# Patient Record
Sex: Male | Born: 1950 | Race: White | Hispanic: No | Marital: Married | Smoking: Never smoker
Health system: Southern US, Community
[De-identification: ages and names within clinical notes are randomized; demographics above are authoritative.]

## PROBLEM LIST (undated history)

## (undated) DIAGNOSIS — E119 Type 2 diabetes mellitus without complications: Secondary | ICD-10-CM

## (undated) DIAGNOSIS — K219 Gastro-esophageal reflux disease without esophagitis: Secondary | ICD-10-CM

## (undated) DIAGNOSIS — G459 Transient cerebral ischemic attack, unspecified: Secondary | ICD-10-CM

## (undated) DIAGNOSIS — E669 Obesity, unspecified: Secondary | ICD-10-CM

## (undated) DIAGNOSIS — F419 Anxiety disorder, unspecified: Secondary | ICD-10-CM

## (undated) DIAGNOSIS — I2510 Atherosclerotic heart disease of native coronary artery without angina pectoris: Secondary | ICD-10-CM

## (undated) DIAGNOSIS — I4891 Unspecified atrial fibrillation: Secondary | ICD-10-CM

## (undated) DIAGNOSIS — G4733 Obstructive sleep apnea (adult) (pediatric): Secondary | ICD-10-CM

## (undated) DIAGNOSIS — K760 Fatty (change of) liver, not elsewhere classified: Secondary | ICD-10-CM

## (undated) DIAGNOSIS — I214 Non-ST elevation (NSTEMI) myocardial infarction: Secondary | ICD-10-CM

## (undated) DIAGNOSIS — E785 Hyperlipidemia, unspecified: Secondary | ICD-10-CM

## (undated) DIAGNOSIS — R600 Localized edema: Secondary | ICD-10-CM

## (undated) DIAGNOSIS — D369 Benign neoplasm, unspecified site: Secondary | ICD-10-CM

## (undated) DIAGNOSIS — R0602 Shortness of breath: Secondary | ICD-10-CM

## (undated) DIAGNOSIS — I509 Heart failure, unspecified: Secondary | ICD-10-CM

## (undated) DIAGNOSIS — G4730 Sleep apnea, unspecified: Secondary | ICD-10-CM

## (undated) DIAGNOSIS — I10 Essential (primary) hypertension: Secondary | ICD-10-CM

## (undated) DIAGNOSIS — R42 Dizziness and giddiness: Secondary | ICD-10-CM

## (undated) DIAGNOSIS — Z9861 Coronary angioplasty status: Secondary | ICD-10-CM

## (undated) DIAGNOSIS — G2581 Restless legs syndrome: Secondary | ICD-10-CM

## (undated) HISTORY — PX: OTHER SURGICAL HISTORY: SHX169

## (undated) HISTORY — DX: Shortness of breath: R06.02

## (undated) HISTORY — PX: KNEE ARTHROSCOPY: SUR90

## (undated) HISTORY — DX: Restless legs syndrome: G25.81

## (undated) HISTORY — PX: VASECTOMY: SHX75

## (undated) HISTORY — DX: Essential (primary) hypertension: I10

## (undated) HISTORY — DX: Benign neoplasm, unspecified site: D36.9

## (undated) HISTORY — DX: Type 2 diabetes mellitus without complications: E11.9

## (undated) HISTORY — DX: Obstructive sleep apnea (adult) (pediatric): G47.33

## (undated) HISTORY — DX: Localized edema: R60.0

## (undated) HISTORY — DX: Transient cerebral ischemic attack, unspecified: G45.9

## (undated) HISTORY — DX: Dizziness and giddiness: R42

## (undated) HISTORY — DX: Sleep apnea, unspecified: G47.30

## (undated) HISTORY — DX: Atherosclerotic heart disease of native coronary artery without angina pectoris: I25.10

## (undated) HISTORY — DX: Heart failure, unspecified: I50.9

## (undated) HISTORY — DX: Coronary angioplasty status: Z98.61

## (undated) HISTORY — DX: Fatty (change of) liver, not elsewhere classified: K76.0

## (undated) HISTORY — DX: Obesity, unspecified: E66.9

## (undated) HISTORY — DX: Anxiety disorder, unspecified: F41.9

## (undated) HISTORY — DX: Non-ST elevation (NSTEMI) myocardial infarction: I21.4

## (undated) HISTORY — DX: Hyperlipidemia, unspecified: E78.5

## (undated) HISTORY — DX: Gastro-esophageal reflux disease without esophagitis: K21.9

## (undated) HISTORY — PX: COLONOSCOPY: SHX174

---

## 1998-12-26 ENCOUNTER — Ambulatory Visit (HOSPITAL_COMMUNITY): Admission: RE | Admit: 1998-12-26 | Discharge: 1998-12-26 | Payer: Self-pay | Admitting: Family Medicine

## 1999-09-12 ENCOUNTER — Emergency Department (HOSPITAL_COMMUNITY): Admission: EM | Admit: 1999-09-12 | Discharge: 1999-09-12 | Payer: Self-pay | Admitting: *Deleted

## 2000-08-22 ENCOUNTER — Encounter: Payer: Self-pay | Admitting: Family Medicine

## 2000-08-22 ENCOUNTER — Ambulatory Visit (HOSPITAL_COMMUNITY): Admission: RE | Admit: 2000-08-22 | Discharge: 2000-08-22 | Payer: Self-pay | Admitting: Family Medicine

## 2000-08-30 ENCOUNTER — Ambulatory Visit (HOSPITAL_COMMUNITY): Admission: RE | Admit: 2000-08-30 | Discharge: 2000-08-30 | Payer: Self-pay | Admitting: Unknown Physician Specialty

## 2000-08-30 ENCOUNTER — Encounter: Payer: Self-pay | Admitting: Gastroenterology

## 2000-08-31 ENCOUNTER — Ambulatory Visit (HOSPITAL_COMMUNITY): Admission: RE | Admit: 2000-08-31 | Discharge: 2000-08-31 | Payer: Self-pay | Admitting: Gastroenterology

## 2000-09-14 ENCOUNTER — Encounter: Payer: Self-pay | Admitting: General Surgery

## 2000-09-14 ENCOUNTER — Ambulatory Visit (HOSPITAL_COMMUNITY): Admission: RE | Admit: 2000-09-14 | Discharge: 2000-09-14 | Payer: Self-pay | Admitting: General Surgery

## 2001-10-18 HISTORY — PX: LEFT HEART CATH AND CORONARY ANGIOGRAPHY: CATH118249

## 2004-01-06 ENCOUNTER — Encounter: Admission: RE | Admit: 2004-01-06 | Discharge: 2004-01-06 | Payer: Self-pay | Admitting: Family Medicine

## 2004-10-18 HISTORY — PX: LEFT HEART CATH AND CORONARY ANGIOGRAPHY: CATH118249

## 2005-07-26 ENCOUNTER — Observation Stay (HOSPITAL_COMMUNITY): Admission: EM | Admit: 2005-07-26 | Discharge: 2005-07-27 | Payer: Self-pay | Admitting: *Deleted

## 2005-09-21 ENCOUNTER — Encounter: Admission: RE | Admit: 2005-09-21 | Discharge: 2005-09-21 | Payer: Self-pay | Admitting: Family Medicine

## 2005-10-01 ENCOUNTER — Encounter: Admission: RE | Admit: 2005-10-01 | Discharge: 2005-10-01 | Payer: Self-pay | Admitting: Orthopedic Surgery

## 2005-10-06 ENCOUNTER — Ambulatory Visit (HOSPITAL_BASED_OUTPATIENT_CLINIC_OR_DEPARTMENT_OTHER): Admission: RE | Admit: 2005-10-06 | Discharge: 2005-10-06 | Payer: Self-pay | Admitting: Orthopedic Surgery

## 2005-10-06 ENCOUNTER — Ambulatory Visit (HOSPITAL_COMMUNITY): Admission: RE | Admit: 2005-10-06 | Discharge: 2005-10-06 | Payer: Self-pay | Admitting: Orthopedic Surgery

## 2005-12-11 ENCOUNTER — Emergency Department (HOSPITAL_COMMUNITY): Admission: EM | Admit: 2005-12-11 | Discharge: 2005-12-11 | Payer: Self-pay | Admitting: Emergency Medicine

## 2006-02-05 ENCOUNTER — Emergency Department (HOSPITAL_COMMUNITY): Admission: EM | Admit: 2006-02-05 | Discharge: 2006-02-05 | Payer: Self-pay | Admitting: Emergency Medicine

## 2007-04-07 ENCOUNTER — Encounter: Admission: RE | Admit: 2007-04-07 | Discharge: 2007-04-07 | Payer: Self-pay | Admitting: Family Medicine

## 2007-04-20 ENCOUNTER — Emergency Department (HOSPITAL_COMMUNITY): Admission: EM | Admit: 2007-04-20 | Discharge: 2007-04-20 | Payer: Self-pay | Admitting: Emergency Medicine

## 2008-01-15 ENCOUNTER — Encounter: Admission: RE | Admit: 2008-01-15 | Discharge: 2008-01-15 | Payer: Self-pay | Admitting: Family Medicine

## 2008-05-01 ENCOUNTER — Encounter: Admission: RE | Admit: 2008-05-01 | Discharge: 2008-05-01 | Payer: Self-pay | Admitting: Family Medicine

## 2008-06-23 ENCOUNTER — Emergency Department (HOSPITAL_COMMUNITY): Admission: EM | Admit: 2008-06-23 | Discharge: 2008-06-23 | Payer: Self-pay | Admitting: Emergency Medicine

## 2009-07-05 ENCOUNTER — Emergency Department (HOSPITAL_COMMUNITY): Admission: EM | Admit: 2009-07-05 | Discharge: 2009-07-05 | Payer: Self-pay | Admitting: Emergency Medicine

## 2010-03-21 ENCOUNTER — Encounter: Admission: RE | Admit: 2010-03-21 | Discharge: 2010-03-21 | Payer: Self-pay | Admitting: Neurosurgery

## 2010-10-18 DIAGNOSIS — R42 Dizziness and giddiness: Secondary | ICD-10-CM

## 2010-10-18 HISTORY — DX: Dizziness and giddiness: R42

## 2010-11-08 ENCOUNTER — Encounter: Payer: Self-pay | Admitting: Family Medicine

## 2011-01-19 ENCOUNTER — Emergency Department (HOSPITAL_COMMUNITY)
Admission: EM | Admit: 2011-01-19 | Discharge: 2011-01-19 | Disposition: A | Discharge status: Home / Self Care | Payer: 59 | Attending: Emergency Medicine | Admitting: Emergency Medicine

## 2011-01-19 ENCOUNTER — Emergency Department (HOSPITAL_COMMUNITY): Payer: 59

## 2011-01-19 DIAGNOSIS — Z79899 Other long term (current) drug therapy: Secondary | ICD-10-CM | POA: Insufficient documentation

## 2011-01-19 DIAGNOSIS — K3189 Other diseases of stomach and duodenum: Secondary | ICD-10-CM | POA: Insufficient documentation

## 2011-01-19 DIAGNOSIS — Z7982 Long term (current) use of aspirin: Secondary | ICD-10-CM | POA: Insufficient documentation

## 2011-01-19 DIAGNOSIS — I519 Heart disease, unspecified: Secondary | ICD-10-CM | POA: Insufficient documentation

## 2011-01-19 DIAGNOSIS — R079 Chest pain, unspecified: Secondary | ICD-10-CM | POA: Insufficient documentation

## 2011-01-19 DIAGNOSIS — I10 Essential (primary) hypertension: Secondary | ICD-10-CM | POA: Insufficient documentation

## 2011-01-19 DIAGNOSIS — E7800 Pure hypercholesterolemia, unspecified: Secondary | ICD-10-CM | POA: Insufficient documentation

## 2011-01-19 DIAGNOSIS — R61 Generalized hyperhidrosis: Secondary | ICD-10-CM | POA: Insufficient documentation

## 2011-01-19 DIAGNOSIS — R1013 Epigastric pain: Secondary | ICD-10-CM | POA: Insufficient documentation

## 2011-01-19 LAB — DIFFERENTIAL
Basophils Absolute: 0 K/uL (ref 0.0–0.1)
Basophils Relative: 0 % (ref 0–1)
Eosinophils Absolute: 0.3 10*3/uL (ref 0.0–0.7)
Eosinophils Relative: 4 % (ref 0–5)
Lymphocytes Relative: 34 % (ref 12–46)
Lymphs Abs: 2.3 10*3/uL (ref 0.7–4.0)
Monocytes Absolute: 0.5 10*3/uL (ref 0.1–1.0)
Monocytes Relative: 8 % (ref 3–12)
Neutro Abs: 3.6 K/uL (ref 1.7–7.7)
Neutrophils Relative %: 54 % (ref 43–77)

## 2011-01-19 LAB — COMPREHENSIVE METABOLIC PANEL
AST: 23 U/L (ref 0–37)
CO2: 25 mEq/L (ref 19–32)
Calcium: 9.3 mg/dL (ref 8.4–10.5)
Chloride: 106 mEq/L (ref 96–112)
Glucose, Bld: 108 mg/dL — ABNORMAL HIGH (ref 70–99)
Sodium: 138 mEq/L (ref 135–145)
Total Protein: 6.7 g/dL (ref 6.0–8.3)

## 2011-01-19 LAB — COMPREHENSIVE METABOLIC PANEL WITH GFR
ALT: 24 U/L (ref 0–53)
Albumin: 3.9 g/dL (ref 3.5–5.2)
Alkaline Phosphatase: 59 U/L (ref 39–117)
BUN: 15 mg/dL (ref 6–23)
Creatinine, Ser: 1.09 mg/dL (ref 0.4–1.5)
GFR calc Af Amer: >60 mL/min (ref >60)
GFR calc non Af Amer: >60 mL/min (ref >60)
Potassium: 4.1 meq/L (ref 3.5–5.1)
Total Bilirubin: 0.6 mg/dL (ref 0.3–1.2)

## 2011-01-19 LAB — URINALYSIS, ROUTINE W REFLEX MICROSCOPIC
Bilirubin Urine: NEGATIVE
Glucose, UA: NEGATIVE mg/dL
Hgb urine dipstick: NEGATIVE
Ketones, ur: NEGATIVE mg/dL
Nitrite: NEGATIVE
Protein, ur: NEGATIVE mg/dL
Specific Gravity, Urine: 1.011 (ref 1.005–1.030)
Urobilinogen, UA: 0.2 mg/dL (ref 0.0–1.0)
pH: 6 (ref 5.0–8.0)

## 2011-01-19 LAB — POCT CARDIAC MARKERS
CKMB, poc: <1 ng/mL — ABNORMAL LOW (ref 1.0–8.0)
Myoglobin, poc: 82.7 ng/mL (ref 12–200)
Troponin i, poc: <0.05 ng/mL (ref 0.00–0.09)

## 2011-01-19 LAB — CBC
HCT: 42.3 % (ref 39.0–52.0)
Hemoglobin: 15.2 g/dL (ref 13.0–17.0)
MCH: 33.8 pg (ref 26.0–34.0)
MCHC: 35.9 g/dL (ref 30.0–36.0)
MCV: 94 fL (ref 78.0–100.0)
Platelets: 127 K/uL — ABNORMAL LOW (ref 150–400)
RBC: 4.5 MIL/uL (ref 4.22–5.81)
RDW: 12.7 % (ref 11.5–15.5)
WBC: 6.7 K/uL (ref 4.0–10.5)

## 2011-01-19 LAB — LIPASE, BLOOD: Lipase: 27 U/L (ref 11–59)

## 2011-03-05 NOTE — Op Note (Signed)
Devon Mathews, Devon Mathews                    ACCOUNT NO.:  [id number]   MEDICAL RECORD NO.:  [id number]          PATIENT TYPE:  AMB   LOCATION:  DSC                          FACILITY:  [facility]   PHYSICIAN:  Leonides Grills, M.D.     DATE OF BIRTH:  [birth year]   DATE OF PROCEDURE:  10/06/2005  DATE OF DISCHARGE:  10/06/2005                                 OPERATIVE REPORT   PREOPERATIVE DIAGNOSES:  1.  Left anterior ankle impingement.  2.  Left anterior distal tibial spurs.  3.  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  4.  Left multiple loose bodies ankle.   POSTOPERATIVE DIAGNOSES:  1.  Left anterior ankle impingement.  2.  Left anterior distal tibial spurs.  3.  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  4.  Left multiple loose bodies ankle.   OPERATION:  1.  Left ankle arthroscopy with extensive debridement.  2.  Left debridement drilling lateral talar dome osteochondral lesion.  3.  Chondroplasty left medial osteochondral lesion.  4.  Left excision multiple ankle loose bodies.  5.  Excision anterior distal tibial spurs.   ANESTHESIA:  General anesthesia.   SURGEON:  Leonides Grills, M.D.   ASSISTANT:  Devon Mathews, P.A.-C.   ESTIMATED BLOOD LOSS:  Minimal.   TOURNIQUET:  None.   COMPLICATIONS:  None.   DISPOSITION:  Stable to PR.   INDICATIONS FOR PROCEDURE:  This is a 60 year old male who has had  persistent anterior ankle pain that was interfering with his life to the  point that he could not do what wants to do.  Mechanical symptoms radiated  pain then giving way with clicking, catching and at times frank locking.  He  was consented for the above procedure.  All risks which included infection,  nerve, vessel injury, persistent pain, worse pain, stiffness, arthritis were  all explained.  Questions were encouraged and answered.  He also had MRI  findings of a peroneus brevus tendon tear but was asymptomatic, had no pain  on the posterolateral  aspect of his ankle.   DESCRIPTION OF PROCEDURE:  Patient brought to the operating room, placed in  supine position.  Initially after adequate general endotracheal tube  anesthesia  was administered as well as Ancef 1 g IV piggyback, patient was  then placed in sloppy lateral position with left operative side up.  All  bony prominences were well padded.  Left lower extremity was then prepped  and draped in the usual sterile manner.  No tourniquet was used.  We started  the procedure by mapping out the anatomical landmarks to include anterior  tibialis tendon and peroneus tertius.  Superficial peroneal nerve could not  be visualized.  Just medial to the anterior tibialis tendon, spinal needle  was then placed and 20 mL of normal saline was placed in the ankle.  Devon Mathews  and spread technique was then utilized to create the anterior medial portal  just medial to the anterior tibialis tendon.  Blunt tip trocar with cannula  followed by camera was then placed in  the ankle and under direct  visualization with the scope, the anterolateral portal was then created just  lateral to the peroneus tertius tendon using spinal needle followed by nick  and spread technique.  After this was done, we then performed an extensive  debridement.  There was a tremendous amount of synovitis over the entire  anterior aspect of the ankle.  This was done with a Barracuda shaver as well  as a radiofrequency bevel.  This took quite some time. Once this was done,  the incisions were then lengthened for the scope to accommodate removal of  the loose bodies.  Once this was done using a snap as well as a synovectomy  rongeur, seven loose bodies were removed from the ankle of varying sizes.  They were found in both the anterior, anterolateral and medial aspects of  the ankle.  Once this was done, we then visualized the cartilage.  There was  a 3 x 3 mm osteochondral lesion on the anterolateral talar dome.  A formal   debridement drilling was performed with a house curette followed by a  microfracture drilling technique.  Once this was done, we then had an area  of frayed cartilage on the medial talar dome. This was not soft and did not  have a full thickness lesion.  We then performed chondroplasty setting the  radiofrequency bevel on level 1 and approximately 2 mm above its surface  performed a chondroplasty and this cleaned this up nicely.  Once this was  done, the ankle was ranged and it was found that the anterior distal tibial  spur mostly medially and this was verified on CT scan, was a large spur,  with a 3.5 knee bur as well as a synovectomy rongeur, this spur was  completely removed.  The ankle was ranged and it was found that there was no  impinging areas.  There was obvious areas of cartilage wear throughout the  ankle, most likely secondary to multiple loose bodies that were floating  around and binding in his ankle.  Pictures were obtained throughout the  procedure.  Once inflow was decreased, the osteochondral lesion that was  debrided and drilled bled nicely.  Scope was removed.  Wounds were closed  with 4-0 nylon suture.  Sterile dressing was applied.  Cam Walker boot was  applied.  The patient was stable to the PR.      Leonides Grills, M.D.  Electronically Signed     PB/MEDQ  D:  10/06/2005  T:  10/08/2005  Job:  [id number]   cc:   Rodolph Bong, M.D.  Fax: [phone]

## 2011-03-05 NOTE — Discharge Summary (Signed)
Devon Mathews, Devon Mathews                    ACCOUNT NO.:  [id number]   MEDICAL RECORD NO.:  [id number]          PATIENT TYPE:  INP   LOCATION:  2007                         FACILITY:  [facility]   PHYSICIAN:  Richard A. Alanda Amass, M.D.DATE OF BIRTH:  1951/10/03   DATE OF ADMISSION:  07/26/2005  DATE OF DISCHARGE:  07/27/2005                                 DISCHARGE SUMMARY   Mr. Lytle Malburg is a 60 year old white married male patient of Dr. Susa Griffins who came to the hospital because of chest pain and shortness of  breath.  He had an onset of squeezing in his mid chest at 9 a.m. while  sitting at his desk doing paperwork.  No nausea, vomiting, diaphoresis.  No  radiation.  He had a similar episode approximately one week ago.  He has had  a history of coronary artery disease.  He had a catheterization in 2003.  He  states these are the same symptoms.  Thus, he came into the hospital, was  admitted.  His CPK-MBs were negative.  He went on to have a heart  catheterization on July 26, 2005 and it showed only a 40% in his LAD.  His  chest x-ray was negative.  It was decided to put him empirically on b.i.d.  Protonix.   LABORATORIES:  Hemoglobin was 13.5, hematocrit 39.3, WBC 4.9, platelets 162.  Sodium 140, potassium 3.7, BUN 8, creatinine 1.1, glucose 110.  AST was 23,  ALT was 36.  CK-MBs and troponins were negative.  TSH was 2.235.  Uric acid  was 4.1.  His fasting lipid profile was pending at the time of this  discharge.  His chest x-ray showed cardiomegaly.   DISCHARGE MEDICATIONS:  1.  Zocor 20 mg at bedtime.  2.  Allopurinol 300 mg one time per day.  3.  Aspirin 81 mg one time per day.  4.  Toprol XL 25 mg one time per day.  5.  Protonix 40 mg two times per day.  6.  Niaspan 1000 mg at bedtime.   He should do no lifting, pushing, pulling, strenuous activity x1 week.  He  should take a tub bath or sit in a pool of water for one week.  He did have  a Perclose procedure.  He will be on  a low saturated and low trans fatty  acid diet.  He may drive in one day.   DISCHARGE DIAGNOSES:  1.  Chest pain, not coronary ischemia status post catheterization with only      a 40% left anterior descending.  2.  Normal ejection fraction.  3.  Obesity.  4.  Hyperlipidemia.  5.  Gastroesophageal reflux disease and hiatal hernia.  6.  Hypertension.      Lezlie Octave, N.P.      Richard A. Alanda Amass, M.D.  Electronically Signed    BB/MEDQ  D:  07/27/2005  T:  07/27/2005  Job:  [id number]   cc:   Quita Skye. Artis Flock, M.D.  Fax: [phone]

## 2011-03-05 NOTE — Cardiovascular Report (Signed)
NAMEJAXEN, SAMPLES                    ACCOUNT NO.:  [id number]   MEDICAL RECORD NO.:  [id number]          PATIENT TYPE:  INP   LOCATION:  2007                         FACILITY:  [facility]   PHYSICIAN:  Richard A. Alanda Amass, M.D.DATE OF BIRTH:  October 30, 1950   DATE OF PROCEDURE:  07/26/2005  DATE OF DISCHARGE:                              CARDIAC CATHETERIZATION   PROCEDURE:  1.  Retrograde central aortic catheterization.  2.  Selective coronary angiography via Judkins technique.  3.  LV angiogram, RAO/LAO projection.  4.  Abdominal aortic angiogram, midstream PA projection, hand injection.   PROCEDURE:  The patient was brought to the Second Floor CPU Lab in a  postabsorptive state after 5 mg Valium p.o. premedication.  No preoperative  heparin was given.  Renal function, CBC and diff. was normal __________  proceed with diagnostic coronary angiography.  The right groin was prepped,  draped in the usual manner.  Xylocaine 1% was used for local anesthesia and  the CRFA was entered with the single anterior puncture using an 18 thin-  walled needle using modified Seldinger technique.  A #6 [place]  __Daig________ side-arm sheath was inserted without difficulty.  Coronary  angiography was done with 6 French 4 cm taper, preformed cordis coronary and  pigtail catheters.  LV angiogram was done in the RAO and LAO projection at  25 mL, 14 mL per second and 20 mL, 12 mL per second respectively.  Pullback  pressure to the CA showed no gradient across the aortic valve.  Abdominal  angiogram was done in the midstream PA projection by hand injection above  the level of the renal arteries.  This demonstrated single normal renal  arteries bilaterally and normal appearing infrarenal abdominal aorta.  The  catheter was removed, side-arm sheath was flushed.  A right femoral  angiogram was done by hand injection in the oblique projection,  demonstrating good puncture into the RCFA.  The groin was then closed  with  an Angio-Seal 6 French closure device successfully in the laboratory.  The  patient was transferred to the holding area for postoperative care in stable  condition.  He tolerated the procedure well.   PRESSURES:  LV:  140/0; LVEDP 16 mmHg.  CA:  140/80 mmHg.   There was no gradient across the aortic valve on catheter pullback.   LV angiogram demonstrated a normally contracting ventricle with no segmental  wall motion abnormality, EF greater than 55% and no mitral regurgitation.  Suggestion of mild ventricular hypertrophy in the LAO projection.   Fluoroscopy did not reveal any significant coronary, intracardiac or  valvular calcification.   The main left coronary artery was normal.   The left anterior descending artery had a 40% segmental mildly irregular  lesion after the large first diagonal and before SP1.  This was in the  region of prior LAD narrowing and did not appear any different, based on  patient's prior cath report.  The remainder of the LAD was widely patent,  coursed the apex of the heart and bifurcated at the undersurface with good  flow.  There was some mild systolic bridging in the mid LA but no systolic  compression seen.   The first diagonal branch was large, arose before the 40% LAD lesion,  trifurcated and was normal.   The second diagonal arose at the junction of the proximal third of the LAD  after SP1 and was small and normal.  There was a small third diagonal that  was normal from the mid LAD.   There was a small optional diagonal vessel that was normal.   Circumflex was comprised of a large bifurcating first marginal branch that  arose proximally, it was normal.  The remainder of the circumflex gave off a  small OM-2 and a small PAVG and distal circumflex branch which were normal.   The right coronary was the dominant vessel.  It was widely patent and smooth  throughout with normal PDA and PLA, normal RV branches from the mid portion  and conus  branch proximally.   DISCUSSION:  Mr. Quintin is a 60 year old married father of two with 3  grandchildren who is a nonsmoker.  He has hyperlipidemia and a strong family  history of coronary disease whose father, Claudio Mondry, Sr., is a patient of  mine and has had CABG.  He works as a Agricultural engineer.  He has been evaluated  for chest pain symptoms in the past and underwent catheterization by Dr.  Allyson Sabal as an outpatient August 28, 2002, and had 30-40% segmental proximal  LAD narrowing and was treated medically.  A Cardiolite on January 31, 2004,  showed mild diaphragmatic attenuation with no significant ischemia.  While  at work today he had two episodes of moderately severe substernal chest pain  while sitting at his desk doing paperwork, no nausea, vomiting, diaphoresis  or radiation, and he was brought to the [facility] Emergency [facility] by ambulance.  Enzymes, including CPK and troponin, were negative.  EKG showed nonspecific  ST segment changes with minor ICVD, no change from previous EKGs with sinus  rhythm.  He was referred for diagnostic catheterization by Dr. Allyson Sabal.  Catheterization shows approximately 40% mildly hypodense submental proximal  LAD stenosis, that does not appear obstructive, with good LAD flow.  The  remainder of his coronary arteries are normal.  I would recommend continued  medical therapy in this setting, weight reduction exercise program,  continued statins, beta-blockers and consideration for ACE inhibitors or  ARB, depending upon his blood pressure, for optimal medical therapy.  The  etiology of his chest pain is not clear and we will empirically give him  upper GI medication at present.   CATHETERIZATION DIAGNOSES:  1.  Chest pain etiology not determined.  2.  Exogenous obesity.  3.  Hyperlipidemia, on therapy.  4.  Gastroesophageal reflux disease.  5.  Minor coronary artery disease, 40% segmental proximal left anterior     descending stenosis, no significantly changed  from prior catheterization      2003.  6.  Mild hypertension.      Richard A. Alanda Amass, M.D.  Electronically Signed     RAW/MEDQ  D:  07/26/2005  T:  07/26/2005  Job:  [id number]   cc:   Quita Skye. Artis Flock, M.D.  Fax: [phone]

## 2011-07-21 LAB — DIFFERENTIAL
Basophils Relative: 0
Eosinophils Absolute: 0.2
Eosinophils Relative: 4
Lymphs Abs: 1.9
Monocytes Absolute: 0.4
Monocytes Relative: 8
Neutrophils Relative %: 52

## 2011-07-21 LAB — B-NATRIURETIC PEPTIDE (CONVERTED LAB): Pro B Natriuretic peptide (BNP): 37

## 2011-07-21 LAB — URINE CULTURE: Culture: NO GROWTH

## 2011-07-21 LAB — URINALYSIS, ROUTINE W REFLEX MICROSCOPIC
Bilirubin Urine: NEGATIVE
Glucose, UA: NEGATIVE
Hgb urine dipstick: NEGATIVE
Ketones, ur: NEGATIVE
Protein, ur: NEGATIVE
Urobilinogen, UA: 0.2

## 2011-07-21 LAB — POCT CARDIAC MARKERS: CKMB, poc: <1 — ABNORMAL LOW

## 2011-07-21 LAB — COMPREHENSIVE METABOLIC PANEL
AST: 19
Albumin: 3.4 — ABNORMAL LOW
Alkaline Phosphatase: 62
BUN: 12
Chloride: 109
GFR calc Af Amer: >60
Potassium: 4.1
Total Protein: 5.9 — ABNORMAL LOW

## 2011-07-21 LAB — BASIC METABOLIC PANEL
CO2: 25
Chloride: 108
Creatinine, Ser: 1.08
GFR calc Af Amer: >60
Potassium: 4.1

## 2011-07-21 LAB — CBC
HCT: 40.7
Hemoglobin: 13.9
MCHC: 34.2
MCV: 99.4
RBC: 4.09 — ABNORMAL LOW
WBC: 5.2

## 2011-08-03 LAB — COMPREHENSIVE METABOLIC PANEL
Alkaline Phosphatase: 67
BUN: 10
CO2: 25
Chloride: 106
GFR calc non Af Amer: >60
Glucose, Bld: 98
Potassium: 5
Total Bilirubin: 1.5 — ABNORMAL HIGH

## 2011-08-03 LAB — DIFFERENTIAL
Basophils Absolute: 0
Basophils Relative: 1
Monocytes Absolute: 0.4
Neutro Abs: 3.6
Neutrophils Relative %: 56

## 2011-08-03 LAB — CBC
HCT: 44.2
Hemoglobin: 15.6
MCV: 95.5
WBC: 6.5

## 2011-08-03 LAB — URINALYSIS, ROUTINE W REFLEX MICROSCOPIC
Glucose, UA: NEGATIVE
Hgb urine dipstick: NEGATIVE
Ketones, ur: NEGATIVE
pH: 6.5

## 2011-08-03 LAB — LIPASE, BLOOD: Lipase: 22

## 2013-03-07 ENCOUNTER — Telehealth: Payer: Self-pay | Admitting: Cardiovascular Disease

## 2013-03-07 NOTE — Telephone Encounter (Signed)
Devon Mathews needs his prescription refill, but needs an authorization for the refills: Simcor 1000/20 mg .. Pharmacy [facility] [phone]

## 2013-03-07 NOTE — Telephone Encounter (Signed)
Returned call.  Pt informed of refill process.  Pt verbalized understanding and agreed w/ plan.  E-rx sent to [facility] and pt aware.

## 2013-03-13 ENCOUNTER — Telehealth: Payer: Self-pay | Admitting: Cardiovascular Disease

## 2013-03-13 NOTE — Telephone Encounter (Signed)
Pharmacist need you to call-wants to be sure this is pt's medicine and not his father medicine-pt wife is there waiting!

## 2013-03-13 NOTE — Telephone Encounter (Signed)
He wants to talk to Devon Mathews C-Pt states that he is very upset about mix up in his prescription-said he was there at the pharmacy for over a hour!

## 2013-06-27 ENCOUNTER — Other Ambulatory Visit: Payer: Self-pay | Admitting: Cardiovascular Disease

## 2013-06-27 ENCOUNTER — Other Ambulatory Visit: Payer: Self-pay | Admitting: *Deleted

## 2013-06-27 DIAGNOSIS — I10 Essential (primary) hypertension: Secondary | ICD-10-CM

## 2013-06-27 LAB — COMPREHENSIVE METABOLIC PANEL
ALT: 33 U/L (ref 0–53)
Alkaline Phosphatase: 62 U/L (ref 39–117)
CO2: 25 mEq/L (ref 19–32)
Sodium: 138 mEq/L (ref 135–145)
Total Bilirubin: 0.6 mg/dL (ref 0.3–1.2)
Total Protein: 6.6 g/dL (ref 6.0–8.3)

## 2013-06-27 LAB — CBC WITH DIFFERENTIAL/PLATELET
Eosinophils Absolute: 0.3 10*3/uL (ref 0.0–0.7)
Lymphs Abs: 1.6 10*3/uL (ref 0.7–4.0)
MCH: 33.9 pg (ref 26.0–34.0)
Neutrophils Relative %: 56 % (ref 43–77)
Platelets: 147 10*3/uL — ABNORMAL LOW (ref 150–400)
RBC: 4.4 MIL/uL (ref 4.22–5.81)
WBC: 5.2 10*3/uL (ref 4.0–10.5)

## 2013-06-27 LAB — HEMOGLOBIN A1C: Hgb A1c MFr Bld: 5.7 % — ABNORMAL HIGH (ref <5.7)

## 2013-06-27 LAB — URIC ACID: Uric Acid, Serum: 6.2 mg/dL (ref 4.0–7.8)

## 2013-06-27 LAB — LIPID PANEL
Cholesterol: 140 mg/dL (ref 0–200)
LDL Cholesterol: 78 mg/dL (ref 0–99)
VLDL: 21 mg/dL (ref 0–40)

## 2013-06-27 LAB — TSH: TSH: 3.744 u[IU]/mL (ref 0.350–4.500)

## 2013-07-02 ENCOUNTER — Ambulatory Visit (HOSPITAL_COMMUNITY)
Admission: RE | Admit: 2013-07-02 | Discharge: 2013-07-02 | Disposition: A | Discharge status: Home / Self Care | Payer: 59 | Source: Ambulatory Visit | Attending: Cardiovascular Disease | Admitting: Cardiovascular Disease

## 2013-07-02 DIAGNOSIS — I10 Essential (primary) hypertension: Secondary | ICD-10-CM | POA: Insufficient documentation

## 2013-07-02 NOTE — Progress Notes (Signed)
2D Echo Performed 07/02/2013    Jasn Xia, RCS  

## 2013-07-10 DIAGNOSIS — G4730 Sleep apnea, unspecified: Secondary | ICD-10-CM

## 2013-07-27 ENCOUNTER — Encounter: Payer: Self-pay | Admitting: Cardiovascular Disease

## 2013-08-01 ENCOUNTER — Telehealth: Payer: Self-pay | Admitting: Cardiovascular Disease

## 2013-08-01 NOTE — Telephone Encounter (Signed)
Wants to get results of sleep study done about a month ago.  No one has called him  Please call

## 2013-08-01 NOTE — Telephone Encounter (Signed)
Former pt of Dr. Alanda Amass.  Reviewed chart and result in Epic.  Pt has been assigned to Dr. Herbie Baltimore.    Message forwarded to Dr. Donneta Romberg for review.

## 2013-08-03 NOTE — Telephone Encounter (Signed)
Contacted [facility]. Spoke to [employer]. She stated that they have attempted to call patient to set up his 2nd night study with know success. She only has had his home #; Gave her the # he called into the office.she stated she would give him a call today.  Called and spoke to the patient. Informed him he needed a 2nd night test. Also informed him that [facility] has been trying get in touch with him on his home phone. He stated he was not at home and they would have to contact him on his cell phone. Informed him that they had it

## 2013-08-24 ENCOUNTER — Telehealth: Payer: Self-pay | Admitting: Cardiovascular Disease

## 2013-08-24 NOTE — Telephone Encounter (Signed)
Need refill on Simcor 1000/20 mg #30.Please call today

## 2013-08-24 NOTE — Telephone Encounter (Signed)
Paper chart requested.

## 2013-08-27 MED ORDER — NIACIN-SIMVASTATIN ER 1000-20 MG PO TB24: 1.0000 | ORAL_TABLET | Freq: Every day | ORAL | Status: DC

## 2013-08-27 NOTE — Telephone Encounter (Signed)
Call to pt and verified x 2.  Pt informed refill request received and need to review meds. Unable to view last OV note at time r/t wrong chart.  Pt confirmed he is taking Simcor and reviewed current meds.  List updated.  Pt would like Rx sent to [facility] [address] instead of [facility] and informed it will be once paper chart received and confirmed.  Pt verbalized understanding and agreed w/ plan.  Paper chart reviewed and Refill(s) sent to pharmacy.  Pt requested 30-day supply.  Name Alert written on pt/father's paper chart as they have the same name and are both patients.  Pt wanted to know about sleep study results.  Stated he went back for second night and hasn't heard anything from anyone.  Informed Burna Mortimer, CMA, who works w/ Dr. Tresa Endo and sleep patients, will be notified.  Pt verbalized understanding and agreed w/ plan.

## 2013-08-28 ENCOUNTER — Other Ambulatory Visit: Payer: Self-pay | Admitting: *Deleted

## 2013-08-28 MED ORDER — PANTOPRAZOLE SODIUM 40 MG PO TBEC: 40.0000 mg | DELAYED_RELEASE_TABLET | Freq: Every day | ORAL | Status: DC

## 2013-08-31 ENCOUNTER — Telehealth: Payer: Self-pay | Admitting: Cardiovascular Disease

## 2013-08-31 MED ORDER — NIACIN-SIMVASTATIN ER 1000-20 MG PO TB24: 1.0000 | ORAL_TABLET | Freq: Every day | ORAL | Status: DC

## 2013-08-31 NOTE — Telephone Encounter (Signed)
Returned call and pt verified x 2 w/ wife, Harriett Sine.  Informed Rx was sent and documentation receipt confirmed by pharmacy.  Informed RN also called pharmacy to make sure it was received and was told it was not.  RN gave VO for Rx.  Wife verbalized understanding and will check back w/ pharmacy.

## 2013-08-31 NOTE — Telephone Encounter (Signed)
Please call-stated that he have been waiting for 2 weeks for his medicine.York Spaniel he spoke to you on Monday and you said you would call it in.He still have not gotten his medicine.

## 2013-09-05 ENCOUNTER — Telehealth: Payer: Self-pay | Admitting: *Deleted

## 2013-09-05 NOTE — Telephone Encounter (Signed)
Devon Mathews called in requesting information about Devon Mathews's sleep study results.  I called [facility] and they processed that yesterday and sent the order to [facility].  Devon Mathews requested the order be sent to [place].  I called [facility] and they will send the order to [facility].

## 2013-09-07 ENCOUNTER — Other Ambulatory Visit: Payer: Self-pay | Admitting: *Deleted

## 2014-01-21 ENCOUNTER — Other Ambulatory Visit: Payer: Self-pay

## 2014-01-21 MED ORDER — METOPROLOL SUCCINATE ER 25 MG PO TB24: 25.0000 mg | ORAL_TABLET | Freq: Every day | ORAL | Status: DC

## 2014-01-21 NOTE — Telephone Encounter (Signed)
Rx was sent to pharmacy electronically. 

## 2014-06-06 ENCOUNTER — Other Ambulatory Visit: Payer: Self-pay | Admitting: Cardiovascular Disease

## 2014-06-06 NOTE — Telephone Encounter (Signed)
Rx was sent to pharmacy electronically. 

## 2014-07-28 ENCOUNTER — Other Ambulatory Visit: Payer: Self-pay | Admitting: Cardiology

## 2014-07-29 NOTE — Telephone Encounter (Signed)
Rx was sent to pharmacy electronically. 

## 2014-09-02 ENCOUNTER — Other Ambulatory Visit: Payer: Self-pay | Admitting: Cardiology

## 2014-09-03 ENCOUNTER — Other Ambulatory Visit: Payer: Self-pay | Admitting: *Deleted

## 2014-09-03 ENCOUNTER — Other Ambulatory Visit: Payer: Self-pay

## 2014-09-03 MED ORDER — PANTOPRAZOLE SODIUM 40 MG PO TBEC: 40.0000 mg | DELAYED_RELEASE_TABLET | Freq: Every day | ORAL | Status: DC

## 2014-09-03 MED ORDER — NIACIN-SIMVASTATIN ER 1000-20 MG PO TB24: 1.0000 | ORAL_TABLET | Freq: Every day | ORAL | Status: DC

## 2014-09-03 NOTE — Telephone Encounter (Signed)
Rx sent to pharmacy   

## 2014-10-05 ENCOUNTER — Other Ambulatory Visit: Payer: Self-pay | Admitting: Cardiology

## 2014-10-07 NOTE — Telephone Encounter (Signed)
Rx(s) sent to pharmacy electronically.  

## 2014-11-04 ENCOUNTER — Telehealth: Payer: Self-pay | Admitting: Cardiology

## 2014-11-06 NOTE — Telephone Encounter (Signed)
Close encounter  He is no longer a pt at this practice

## 2014-11-07 ENCOUNTER — Ambulatory Visit (HOSPITAL_COMMUNITY)
Admission: RE | Admit: 2014-11-07 | Discharge: 2014-11-07 | Disposition: A | Discharge status: Home / Self Care | Payer: Self-pay | Source: Ambulatory Visit | Attending: Sports Medicine | Admitting: Sports Medicine

## 2014-11-07 ENCOUNTER — Other Ambulatory Visit (HOSPITAL_COMMUNITY): Payer: Self-pay | Admitting: Sports Medicine

## 2014-11-07 DIAGNOSIS — M25561 Pain in right knee: Secondary | ICD-10-CM

## 2014-11-07 DIAGNOSIS — Z043 Encounter for examination and observation following other accident: Secondary | ICD-10-CM | POA: Insufficient documentation

## 2015-03-20 DIAGNOSIS — C4492 Squamous cell carcinoma of skin, unspecified: Secondary | ICD-10-CM

## 2015-03-20 HISTORY — DX: Squamous cell carcinoma of skin, unspecified: C44.92

## 2015-04-29 ENCOUNTER — Encounter: Payer: Self-pay | Admitting: *Deleted

## 2015-06-04 ENCOUNTER — Encounter: Payer: Self-pay | Admitting: Cardiovascular Disease

## 2015-06-19 ENCOUNTER — Encounter: Payer: Self-pay | Admitting: Cardiovascular Disease

## 2015-10-10 ENCOUNTER — Ambulatory Visit (HOSPITAL_COMMUNITY)
Admission: RE | Admit: 2015-10-10 | Discharge: 2015-10-10 | Disposition: A | Discharge status: Home / Self Care | Payer: Commercial Managed Care - HMO | Source: Ambulatory Visit | Attending: Internal Medicine | Admitting: Internal Medicine

## 2015-10-10 ENCOUNTER — Encounter (HOSPITAL_COMMUNITY): Payer: Self-pay

## 2015-10-10 ENCOUNTER — Other Ambulatory Visit (HOSPITAL_COMMUNITY): Payer: Self-pay | Admitting: Internal Medicine

## 2015-10-10 DIAGNOSIS — R1031 Right lower quadrant pain: Secondary | ICD-10-CM

## 2015-10-10 DIAGNOSIS — K429 Umbilical hernia without obstruction or gangrene: Secondary | ICD-10-CM | POA: Diagnosis not present

## 2015-10-10 DIAGNOSIS — R1011 Right upper quadrant pain: Secondary | ICD-10-CM

## 2015-10-10 MED ORDER — IOHEXOL 300 MG/ML  SOLN
150.0000 mL | Freq: Once | INTRAMUSCULAR | Status: AC | PRN
  Administered 2015-10-10: 100 mL via INTRAVENOUS

## 2015-10-10 MED ORDER — IOHEXOL 300 MG/ML  SOLN
25.0000 mL | INTRAMUSCULAR | Status: AC
  Administered 2015-10-10: 25 mL via ORAL

## 2016-06-28 ENCOUNTER — Telehealth: Payer: Self-pay | Admitting: Cardiovascular Disease

## 2016-06-28 NOTE — Telephone Encounter (Signed)
Received records from [facility] for appointment on 07/28/16 with Dr Oval [name].  Records given to [facility] (medical records) for Dr Blenda Mounts schedule on 07/28/16. lp

## 2016-07-18 HISTORY — PX: NM MYOVIEW LTD: HXRAD82

## 2016-07-18 HISTORY — PX: TRANSTHORACIC ECHOCARDIOGRAM: SHX275

## 2016-07-27 NOTE — Progress Notes (Signed)
Cardiology Office Note   Date:  07/28/2016   ID:  Devon Mathews, DOB 1950-11-29, MRN [id number]  PCP:  Haywood Pao, MD  Cardiologist:   Skeet Latch, MD   Chief Complaint  Patient presents with  . Chest Pain    pt states every now and then he gets a jabbing feeling under left breast  . Shortness of Breath    upon exertion   . Edema    left ankle   . New Patient (Initial Visit)    pt states he gets cramps in his calf at night       History of Present Illness: Devon Mathews is a 65 y.o. male with CAD, hypertension, hyperlipidemia, OSA and obestiy who presents for an evaluation of chest pain and shortness of breath.  Devon Mathews was previously seen by Dr. Rollene Fare and by Dr. Einar Gip.  He last saw Dr. Einar Gip 01/2015 and reported mild, chronic dyspnea. He previously underwent cardiac catheterization in 2003 and 2006 with a 40% LAD lesion noted.  He had an ETT in 2007 that was negative for ischemia and a nuclear stress in 2011 and 2012 that were negative.  He had an echo 07/02/13 that revealed LVEF 55-60% and mild mitral regurgitation.  He had a sleep study in 2014 that demonstrated moderate OSA.  Devon Mathews reports that for the last several months he notes substernal chest tightness that is 5/10 in severity and does not radiate.  It is associated with shortness of breath and improves within 20 seconds of rest.  There is no associated nausea, diaphoresis, palpitations, or lightheadedness. He especially notes that when walking up an incline. Devon Mathews does not get any formal exercise but is on his feet a lot at work.  He notes swelling in bilateral lower extremities that is worse on the left. He does report previously injuring his left ankle several years ago. The swelling has been chronic since that time. He denies orthopnea or PND.  Devon Mathews notes that his diet is poor.  He typically eats a [place] sandwich every morning and eats many hamburgers.  He has tried to cut back on soda and sweet  tea.  He has a significant family history of heart disease.  His father had a heart attack in his mid 2s and his sister had a heart attack at age 31. They both subsequently developed heart failure. His mother also developed heart failure at an older age.   Past Medical History:  Diagnosis Date  . CAD (coronary artery disease)    Echo, EF =>55%, normal findings  . Chest pain 07/28/2016  . Chest pain, atypical 2012 & 2009   stress myo, EF 65% 2012/ stress myo, EF 65% 2009  . Dizziness 2012   normal findings..  . Essential hypertension 07/28/2016  . Lower extremity edema 07/28/2016  . Obesity   . Obesity (BMI 30-39.9) 07/28/2016  . OSA (obstructive sleep apnea) 07/28/2016  . Restless leg syndrome    mild  . Shortness of breath 07/28/2016    Past Surgical History:  Procedure Laterality Date  . CARDIAC CATHETERIZATION  2003   30-40% proximal segmental stenosis in the LAD  . CARDIAC CATHETERIZATION  2006   EF >50% & no mitral regurgitation     Current Outpatient Prescriptions  Medication Sig Dispense Refill  . amitriptyline (ELAVIL) 25 MG tablet Take 25 mg by mouth at bedtime as needed for sleep.    Marland Kitchen aspirin EC 81 MG tablet  Take 81 mg by mouth daily.    . metoprolol succinate (TOPROL-XL) 25 MG 24 hr tablet Take 1 tablet (25 mg total) by mouth daily. 30 tablet 5  . nitroGLYCERIN (NITROSTAT) 0.4 MG SL tablet Place 0.4 mg under the tongue every 5 (five) minutes as needed for chest pain.    . pantoprazole (PROTONIX) 40 MG tablet TAKE ONE TABLET BY MOUTH ONE TIME DAILY. must make follow up appointment for further refills 15 tablet 0  . SIMCOR 1000-20 MG 24 hr tablet TAKE ONE TABLET BY MOUTH NIGHTLY AT BEDTIME. needs appointment for further refills 15 tablet 0   No current facility-administered medications for this visit.     Allergies:   Penicillins    Social History:  The patient  reports that he has never smoked. He has never used smokeless tobacco.   Family History:  The  patient's family history includes COPD in his father; Diabetes in his brother and sister; Heart attack in his paternal grandmother and sister; Heart disease in his father; Heart failure in his father, mother, and sister; Other in his father and maternal grandfather.    ROS:  Please see the history of present illness.   Otherwise, review of systems are positive for hand tremor, memory loss.   All other systems are reviewed and negative.    PHYSICAL EXAM: VS:  BP 124/76   Pulse 72   Ht 5\' 10"  (1.778 m)   Wt 271 lb 9.6 oz (123.2 kg)   BMI 38.97 kg/m  , Body mass index is 38.97 kg/m. GENERAL:  Well appearing HEENT:  Pupils equal round and reactive, fundi not visualized, oral mucosa unremarkable NECK:  No jugular venous distention, waveform within normal limits, carotid upstroke brisk and symmetric, no bruits, no thyromegaly LYMPHATICS:  No cervical adenopathy LUNGS:  Clear to auscultation bilaterally HEART:  RRR.  PMI not displaced or sustained,S1 and S2 within normal limits, no S3, no S4, no clicks, no rubs, no murmurs ABD:  Flat, positive bowel sounds normal in frequency in pitch, no bruits, no rebound, no guarding, no midline pulsatile mass, no hepatomegaly, no splenomegaly EXT:  2 plus pulses throughout, 1+ pitting edema in the R LE and 2+ in the L, no cyanosis no clubbing SKIN:  No rashes no nodules NEURO:  Cranial nerves II through XII grossly intact, motor grossly intact throughout PSYCH:  Cognitively intact, oriented to person place and time    EKG:  EKG is ordered today. The ekg ordered today demonstrates sinus rhythm. Rate 72 bpm.  Echo 07/02/13: Study Conclusions  - Left ventricle: The cavity size was normal. Wall thickness was normal. Systolic function was normal. The estimated ejection fraction was in the range of 55% to 60%. Wall motion was normal; there were no regional wall motion abnormalities. Left ventricular diastolic function parameters were normal. -  Mitral valve: Mild regurgitation. Valve area by pressure half-time: 2.06cm^2. - Atrial septum: No defect or patent foramen ovale was identified. Impressions:  - Normal 2D echo-doppler study for age.  Persantine Myoview 12/15/10: LVEF 65%.  Negative for ischemia.   Recent Labs: No results found for requested labs within last 8760 hours.    Lipid Panel    Component Value Date/Time   CHOL 140 06/27/2013 0840   TRIG 103 06/27/2013 0840   HDL 41 06/27/2013 0840   CHOLHDL 3.4 06/27/2013 0840   VLDL 21 06/27/2013 0840   LDLCALC 78 06/27/2013 0840      Wt Readings from Last 3 Encounters:  07/28/16 271 lb 9.6 oz (123.2 kg)     ASSESSMENT AND PLAN:   # Non-obstructive CAD:  # Chest pain:  Devon Mathews has a known 40% LAD lesion and now has exertional chest pain.  We will obtain an exercise Myoview to evaluate for ischemia. He will start aspirin 81 mg daily.   # Hypertension: Blood pressure is well-controlled on metoprolol.  # Obesity: We discussed the importance of dietary changes and increased exercise.  He expressed understanding.  We will check a hemoglobin A1c.  # OSA: Devon Mathews had moderate OSA on sleep study several years ago and has not been wearing his mask lately, despite snoring and gaining 20 lb.  He will consider restarting.  # LE edema: # SOB:  We will get an echo to rule out heart failure.  I suspect it is due to venous insufficiency.  Although the edema is asymmetric, he has a prior ankle injury and the swelling is chronic, so DVT unlikely.    Current medicines are reviewed at length with the patient today.  The patient does not have concerns regarding medicines.  The following changes have been made:  Start aspirin 81 mg daily   Labs/ tests ordered today include:   Orders Placed This Encounter  Procedures  . Lipid panel  . CBC with Differential/Platelet  . T4, free  . TSH  . Comprehensive metabolic panel  . HgB A1c  . Myocardial Perfusion Imaging  .  EKG 12-Lead  . ECHOCARDIOGRAM COMPLETE     Disposition:   FU with Devon Mathison C. Oval [name], MD, [facility] in 1 month.     This note was written with the assistance of speech recognition software.  Please excuse any transcriptional errors.  Signed, Nalleli Largent C. Oval [name], MD, [facility]  07/28/2016 8:52 AM    [facility]

## 2016-07-28 ENCOUNTER — Encounter (INDEPENDENT_AMBULATORY_CARE_PROVIDER_SITE_OTHER): Payer: Self-pay

## 2016-07-28 ENCOUNTER — Encounter: Payer: Self-pay | Admitting: Cardiovascular Disease

## 2016-07-28 ENCOUNTER — Ambulatory Visit (INDEPENDENT_AMBULATORY_CARE_PROVIDER_SITE_OTHER): Payer: Commercial Managed Care - HMO | Admitting: Cardiovascular Disease

## 2016-07-28 VITALS — BP 124/76 | HR 72 | Ht 70.0 in | Wt 271.6 lb

## 2016-07-28 DIAGNOSIS — G4733 Obstructive sleep apnea (adult) (pediatric): Secondary | ICD-10-CM

## 2016-07-28 DIAGNOSIS — R079 Chest pain, unspecified: Secondary | ICD-10-CM | POA: Diagnosis not present

## 2016-07-28 DIAGNOSIS — I209 Angina pectoris, unspecified: Secondary | ICD-10-CM | POA: Insufficient documentation

## 2016-07-28 DIAGNOSIS — R0602 Shortness of breath: Secondary | ICD-10-CM

## 2016-07-28 DIAGNOSIS — I208 Other forms of angina pectoris: Secondary | ICD-10-CM

## 2016-07-28 DIAGNOSIS — E669 Obesity, unspecified: Secondary | ICD-10-CM | POA: Diagnosis not present

## 2016-07-28 DIAGNOSIS — R600 Localized edema: Secondary | ICD-10-CM | POA: Insufficient documentation

## 2016-07-28 DIAGNOSIS — R5383 Other fatigue: Secondary | ICD-10-CM

## 2016-07-28 DIAGNOSIS — Z13220 Encounter for screening for lipoid disorders: Secondary | ICD-10-CM

## 2016-07-28 DIAGNOSIS — R251 Tremor, unspecified: Secondary | ICD-10-CM

## 2016-07-28 DIAGNOSIS — I10 Essential (primary) hypertension: Secondary | ICD-10-CM

## 2016-07-28 HISTORY — DX: Localized edema: R60.0

## 2016-07-28 HISTORY — DX: Essential (primary) hypertension: I10

## 2016-07-28 HISTORY — DX: Obesity, unspecified: E66.9

## 2016-07-28 HISTORY — DX: Shortness of breath: R06.02

## 2016-07-28 HISTORY — DX: Obstructive sleep apnea (adult) (pediatric): G47.33

## 2016-07-28 NOTE — Patient Instructions (Addendum)
Medication Instructions:  START ASPIRIN 81 MG DAILY  Labwork: FASTING LP/CMET/CBC/TSH/FT4/A1C AT [facility] ON THE FIRST FLOOR WHEN YOU RETURN FOR YOUR STRESS TEST  Testing/Procedures: Your physician has requested that you have an echocardiogram. Echocardiography is a painless test that uses sound waves to create images of your heart. It provides your doctor with information about the size and shape of your heart and how well your heart's chambers and valves are working. This procedure takes approximately one hour. There are no restrictions for this procedure. [place] [address]  Your physician has requested that you have en exercise stress myoview. For further information please visit [email or web address]. Please follow instruction sheet, as given. 2 DAY STUDY   Follow-Up: Your physician recommends that you schedule a follow-up appointment in: [place]  If you need a refill on your cardiac medications before your next appointment, please call your pharmacy.

## 2016-08-10 ENCOUNTER — Telehealth (HOSPITAL_COMMUNITY): Payer: Self-pay

## 2016-08-10 ENCOUNTER — Telehealth: Payer: Self-pay | Admitting: Cardiovascular Disease

## 2016-08-10 NOTE — Telephone Encounter (Signed)
Patient was concerned about having perfusion imaging study. Patient several question.  RN answered question - one to know why it was 2 day test.  2- wanted to know if test was safe-"cancerogenic" 3-was it enclosed machine- because he can not tolreae enclosure Patient verbalized  that question were answered and understanding.

## 2016-08-10 NOTE — Telephone Encounter (Signed)
new message   Pt verbalized that he is calling to speak to the rn he has ome questions that he wants to ask her

## 2016-08-10 NOTE — Telephone Encounter (Signed)
Pt to call if any questions or concerns.  Encounter complete.

## 2016-08-12 ENCOUNTER — Ambulatory Visit (HOSPITAL_COMMUNITY)
Admission: RE | Admit: 2016-08-12 | Discharge: 2016-08-12 | Disposition: A | Discharge status: Home / Self Care | Payer: Commercial Managed Care - HMO | Source: Ambulatory Visit | Attending: Cardiovascular Disease | Admitting: Cardiovascular Disease

## 2016-08-12 DIAGNOSIS — R0602 Shortness of breath: Secondary | ICD-10-CM

## 2016-08-12 DIAGNOSIS — R079 Chest pain, unspecified: Secondary | ICD-10-CM | POA: Insufficient documentation

## 2016-08-12 MED ORDER — TECHNETIUM TC 99M TETROFOSMIN IV KIT
32.0000 | PACK | Freq: Once | INTRAVENOUS | Status: AC | PRN
  Administered 2016-08-12: 32 via INTRAVENOUS
  Filled 2016-08-12: qty 32

## 2016-08-13 ENCOUNTER — Ambulatory Visit (HOSPITAL_COMMUNITY)
Admission: RE | Admit: 2016-08-13 | Discharge: 2016-08-13 | Disposition: A | Discharge status: Home / Self Care | Payer: Commercial Managed Care - HMO | Source: Ambulatory Visit | Attending: Cardiovascular Disease | Admitting: Cardiovascular Disease

## 2016-08-13 LAB — MYOCARDIAL PERFUSION IMAGING
CHL CUP NUCLEAR SDS: 4
CHL CUP NUCLEAR SRS: 0
CHL CUP NUCLEAR SSS: 4
CHL RATE OF PERCEIVED EXERTION: 17
CSEPED: 5 min
CSEPEW: 6.4 METS
CSEPHR: 87 %
Exercise duration (sec): 1 s
LV dias vol: 86 mL (ref 62–150)
LVSYSVOL: 32 mL
MPHR: 156 {beats}/min
NUC STRESS TID: 0.82
Peak HR: 137 {beats}/min
Rest HR: 73 {beats}/min

## 2016-08-13 MED ORDER — TECHNETIUM TC 99M TETROFOSMIN IV KIT
29.9000 | PACK | Freq: Once | INTRAVENOUS | Status: AC | PRN
  Administered 2016-08-13: 29.9 via INTRAVENOUS

## 2016-08-16 ENCOUNTER — Ambulatory Visit (HOSPITAL_COMMUNITY): Payer: Commercial Managed Care - HMO | Attending: Cardiology

## 2016-08-16 ENCOUNTER — Other Ambulatory Visit: Payer: Self-pay

## 2016-08-16 DIAGNOSIS — R079 Chest pain, unspecified: Secondary | ICD-10-CM | POA: Diagnosis not present

## 2016-08-16 DIAGNOSIS — R0602 Shortness of breath: Secondary | ICD-10-CM | POA: Insufficient documentation

## 2016-08-16 DIAGNOSIS — I083 Combined rheumatic disorders of mitral, aortic and tricuspid valves: Secondary | ICD-10-CM | POA: Diagnosis not present

## 2016-08-24 ENCOUNTER — Encounter: Payer: Self-pay | Admitting: Cardiovascular Disease

## 2016-08-29 NOTE — Progress Notes (Signed)
Cardiology Office Note   Date:  08/30/2016   ID:  Devon Mathews, DOB [birth year], MRN [id number]  PCP:  Haywood Pao, MD  Cardiologist:   Skeet Latch, MD   Chief Complaint  Patient presents with  . Follow-up    1 month; Pt states no Sx.     History of Present Illness: Devon Mathews is a 65 y.o. male with nonobstructive CAD, mild AR, hypertension, hyperlipidemia, OSA and obestiy who presents for follow up.  Devon Mathews was previously seen by Dr. Rollene Fare and by Dr. Einar Gip.  He last saw Dr. Einar Gip 01/2015 and reported mild, chronic dyspnea. He previously underwent cardiac catheterization in 2003 and 2006 with a 40% LAD lesion noted.  He had an ETT in 2007 that was negative for ischemia and a nuclear stress in 2011 and 2012 that were negative.  He had an echo 07/02/13 that revealed LVEF 55-60% and mild mitral regurgitation. He was seen in clinic 07/2016 and reported several months of chest pain and exertional shortness of breath.  He was referred for exercise Myoview 08/13/16 that revealed LVEF 63% and no ischemia.  He also had an echo 08/16/16 with LVEF 60-65%, mild LVH, mild AR and mild TR.  Devon Mathews had a sleep study in 2014 that demonstrated moderate OSA.  At his last appointment he noted that he was not using his CPAP machine.  He continues to avoid the CPAP.  He isn't exercising.  He continues to note mild LE edema at the end of the day.  It improves with elevation of his legs.  He denies chest pain but does have some shortness of breath.     Past Medical History:  Diagnosis Date  . CAD (coronary artery disease)    Echo, EF =>55%, normal findings  . Chest pain 07/28/2016  . Chest pain, atypical 2012 & 2009   stress myo, EF 65% 2012/ stress myo, EF 65% 2009  . Dizziness 2012   normal findings..  . Essential hypertension 07/28/2016  . Lower extremity edema 07/28/2016  . Obesity   . Obesity (BMI 30-39.9) 07/28/2016  . OSA (obstructive sleep apnea) 07/28/2016  . Restless leg  syndrome    mild  . Shortness of breath 07/28/2016    Past Surgical History:  Procedure Laterality Date  . CARDIAC CATHETERIZATION  2003   30-40% proximal segmental stenosis in the LAD  . CARDIAC CATHETERIZATION  2006   EF >50% & no mitral regurgitation     Current Outpatient Prescriptions  Medication Sig Dispense Refill  . amitriptyline (ELAVIL) 25 MG tablet Take 25 mg by mouth at bedtime as needed for sleep.    Marland Kitchen aspirin EC 81 MG tablet Take 81 mg by mouth daily.    . metoprolol succinate (TOPROL-XL) 25 MG 24 hr tablet Take 1 tablet (25 mg total) by mouth daily. 30 tablet 5  . nitroGLYCERIN (NITROSTAT) 0.4 MG SL tablet Place 0.4 mg under the tongue every 5 (five) minutes as needed for chest pain.    . pantoprazole (PROTONIX) 40 MG tablet TAKE ONE TABLET BY MOUTH ONE TIME DAILY. must make follow up appointment for further refills 15 tablet 0  . simvastatin (ZOCOR) 20 MG tablet Take 20 mg by mouth daily.     No current facility-administered medications for this visit.     Allergies:   Penicillins    Social History:  The patient  reports that he has never smoked. He has never used smokeless tobacco. He  reports that he does not drink alcohol or use drugs.   Family History:  The patient's family history includes COPD in his father; Diabetes in his brother and sister; Heart attack in his paternal grandmother and sister; Heart disease in his father; Heart failure in his father, mother, and sister; Other in his father and maternal grandfather.    ROS:  Please see the history of present illness.   Otherwise, review of systems are positive for none.  All other systems are reviewed and negative.    PHYSICAL EXAM: VS:  BP 126/77   Pulse 68   Ht 5' 9.5" (1.765 m)   Wt 124 kg (273 lb 6.4 oz)   BMI 39.80 kg/m  , Body mass index is 39.8 kg/m. GENERAL:  Well appearing HEENT:  Pupils equal round and reactive, fundi not visualized, oral mucosa unremarkable NECK:  No jugular venous  distention, waveform within normal limits, carotid upstroke brisk and symmetric, no bruits LYMPHATICS:  No cervical adenopathy LUNGS:  Clear to auscultation bilaterally HEART:  RRR.  PMI not displaced or sustained,S1 and S2 within normal limits, no S3, no S4, no clicks, no rubs, no murmurs ABD:  Flat, positive bowel sounds normal in frequency in pitch, no bruits, no rebound, no guarding, no midline pulsatile mass, no hepatomegaly, no splenomegaly EXT:  2 plus pulses throughout, 1+ pitting edema in the R LE and 2+ in the L, no cyanosis no clubbing SKIN:  No rashes no nodules NEURO:  Cranial nerves II through XII grossly intact, motor grossly intact throughout PSYCH:  Cognitively intact, oriented to person place and time   EKG:  EKG is ordered today. The ekg ordered today demonstrates sinus rhythm. Rate 72 bpm.  Echo 08/16/16: LVEF 60-65%, mild LVH.  Mild AR, mild TR, trivial MR.  Echo 07/02/13: Study Conclusions  - Left ventricle: The cavity size was normal. Wall thickness was normal. Systolic function was normal. The estimated ejection fraction was in the range of 55% to 60%. Wall motion was normal; there were no regional wall motion abnormalities. Left ventricular diastolic function parameters were normal. - Mitral valve: Mild regurgitation. Valve area by pressure half-time: 2.06cm^2. - Atrial septum: No defect or patent foramen ovale was identified. Impressions:  - Normal 2D echo-doppler study for age.  Exercise Myoview 08/13/16:   The left ventricular ejection fraction is normal (55-65%).  Nuclear stress EF: 63%.  There was no ST segment deviation noted during stress.  The study is normal.  This is a low risk study.     Persantine Myoview 12/15/10: LVEF 65%.  Negative for ischemia.   Recent Labs: No results found for requested labs within last 8760 hours.    Lipid Panel    Component Value Date/Time   CHOL 140 06/27/2013 0840   TRIG 103  06/27/2013 0840   HDL 41 06/27/2013 0840   CHOLHDL 3.4 06/27/2013 0840   VLDL 21 06/27/2013 0840   LDLCALC 78 06/27/2013 0840      Wt Readings from Last 3 Encounters:  08/30/16 124 kg (273 lb 6.4 oz)  08/12/16 122.9 kg (271 lb)  07/28/16 123.2 kg (271 lb 9.6 oz)     ASSESSMENT AND PLAN:   # Non-obstructive CAD:  # Chest pain:  Mr. Seever has a known 40% LAD lesion.  He denies recurrent chest pain.  Exercise Myoview was negative for ischemia. Continue aspirin 81 mg daily. He reports that his PCP manages his lipids. Continue simvastatin and metoprolol.   # Hypertension: Blood  pressure is well-controlled on metoprolol.  # Obesity: We discussed the importance of dietary changes and increased exercise.  He expressed understanding.    # OSA: Mr. Haba had moderate OSA on sleep study.  He was again encouraged to wear his CPAP machine.  # LE edema: # SOB:  Echo is negative for heart failure. He has mild aortic regurgitation. I suspect that his lower extremity edema is due to obesity and venous insufficiency. We discussed wearing compression stockings in avoiding salt.   Current medicines are reviewed at length with the patient today.  The patient does not have concerns regarding medicines.  The following changes have been made:  None.  Labs/ tests ordered today include:   No orders of the defined types were placed in this encounter.    Disposition:   FU with Valincia Touch C. Oval [name], MD, [facility] in 1 year.   This note was written with the assistance of speech recognition software.  Please excuse any transcriptional errors.  Signed, Cruz Devilla C. Oval [name], MD, [facility]  08/30/2016 8:24 AM    [facility]

## 2016-08-30 ENCOUNTER — Encounter: Payer: Self-pay | Admitting: Cardiovascular Disease

## 2016-08-30 ENCOUNTER — Ambulatory Visit (INDEPENDENT_AMBULATORY_CARE_PROVIDER_SITE_OTHER): Payer: Commercial Managed Care - HMO | Admitting: Cardiovascular Disease

## 2016-08-30 VITALS — BP 126/77 | HR 68 | Ht 69.5 in | Wt 273.4 lb

## 2016-08-30 DIAGNOSIS — E7800 Pure hypercholesterolemia, unspecified: Secondary | ICD-10-CM

## 2016-08-30 DIAGNOSIS — I2510 Atherosclerotic heart disease of native coronary artery without angina pectoris: Secondary | ICD-10-CM

## 2016-08-30 DIAGNOSIS — I119 Hypertensive heart disease without heart failure: Secondary | ICD-10-CM | POA: Diagnosis not present

## 2016-08-30 DIAGNOSIS — R600 Localized edema: Secondary | ICD-10-CM

## 2016-08-30 DIAGNOSIS — R0602 Shortness of breath: Secondary | ICD-10-CM

## 2016-08-30 DIAGNOSIS — G4733 Obstructive sleep apnea (adult) (pediatric): Secondary | ICD-10-CM

## 2016-08-30 DIAGNOSIS — E6609 Other obesity due to excess calories: Secondary | ICD-10-CM | POA: Diagnosis not present

## 2016-08-30 NOTE — Patient Instructions (Signed)

## 2017-04-25 ENCOUNTER — Other Ambulatory Visit: Payer: Self-pay | Admitting: Internal Medicine

## 2017-04-25 DIAGNOSIS — R109 Unspecified abdominal pain: Secondary | ICD-10-CM

## 2017-04-29 ENCOUNTER — Ambulatory Visit
Admission: RE | Admit: 2017-04-29 | Discharge: 2017-04-29 | Disposition: A | Discharge status: Home / Self Care | Payer: BLUE CROSS/BLUE SHIELD | Source: Ambulatory Visit | Attending: Internal Medicine | Admitting: Internal Medicine

## 2017-04-29 ENCOUNTER — Other Ambulatory Visit (HOSPITAL_COMMUNITY): Payer: Self-pay | Admitting: Internal Medicine

## 2017-04-29 DIAGNOSIS — R109 Unspecified abdominal pain: Secondary | ICD-10-CM

## 2017-04-29 DIAGNOSIS — R1011 Right upper quadrant pain: Secondary | ICD-10-CM

## 2017-05-09 ENCOUNTER — Ambulatory Visit (HOSPITAL_COMMUNITY)
Admission: RE | Admit: 2017-05-09 | Discharge: 2017-05-09 | Disposition: A | Discharge status: Home / Self Care | Payer: BLUE CROSS/BLUE SHIELD | Source: Ambulatory Visit | Attending: Internal Medicine | Admitting: Internal Medicine

## 2017-05-09 DIAGNOSIS — R1031 Right lower quadrant pain: Secondary | ICD-10-CM | POA: Insufficient documentation

## 2017-05-09 DIAGNOSIS — R1011 Right upper quadrant pain: Secondary | ICD-10-CM | POA: Diagnosis present

## 2017-05-09 MED ORDER — TECHNETIUM TC 99M MEBROFENIN IV KIT
5.5000 | PACK | Freq: Once | INTRAVENOUS | Status: AC | PRN
  Administered 2017-05-09: 5.5 via INTRAVENOUS

## 2017-06-09 DIAGNOSIS — G4733 Obstructive sleep apnea (adult) (pediatric): Secondary | ICD-10-CM | POA: Diagnosis not present

## 2017-08-15 ENCOUNTER — Encounter: Payer: Self-pay | Admitting: Cardiovascular Disease

## 2017-08-15 ENCOUNTER — Ambulatory Visit (INDEPENDENT_AMBULATORY_CARE_PROVIDER_SITE_OTHER): Payer: BLUE CROSS/BLUE SHIELD | Admitting: Cardiovascular Disease

## 2017-08-15 VITALS — BP 132/68 | HR 70 | Ht 70.0 in | Wt 279.2 lb

## 2017-08-15 DIAGNOSIS — I119 Hypertensive heart disease without heart failure: Secondary | ICD-10-CM

## 2017-08-15 DIAGNOSIS — G4733 Obstructive sleep apnea (adult) (pediatric): Secondary | ICD-10-CM

## 2017-08-15 DIAGNOSIS — R0602 Shortness of breath: Secondary | ICD-10-CM

## 2017-08-15 DIAGNOSIS — E7800 Pure hypercholesterolemia, unspecified: Secondary | ICD-10-CM | POA: Diagnosis not present

## 2017-08-15 DIAGNOSIS — I2510 Atherosclerotic heart disease of native coronary artery without angina pectoris: Secondary | ICD-10-CM | POA: Diagnosis not present

## 2017-08-15 NOTE — Patient Instructions (Signed)
Medication Instructions:  START ASPIRIN 81 MG DAILY   Labwork: NONE  Testing/Procedures: NONE  Follow-Up: Your physician wants you to follow-up in: 1 [place] will receive a reminder letter in the mail two months in advance. If you don't receive a letter, please call our office to schedule the follow-up appointment.  Any Other Special Instructions Will Be Listed Below (If Applicable). START WALKING 30 MINUTES A DAY 3 TO 4 DAYS A WEEK   If you need a refill on your cardiac medications before your next appointment, please call your pharmacy.

## 2017-08-15 NOTE — Progress Notes (Signed)
Cardiology Office Note   Date:  08/15/2017   ID:  Devon Mathews, DOB [birth year], MRN [id number]  PCP:  Haywood Pao, MD  Cardiologist:   Skeet Latch, MD   Chief Complaint  Patient presents with  . Follow-up    History of Present Illness: Devon Mathews is a 66 y.o. male with nonobstructive CAD, mild AR, hypertension, hyperlipidemia, OSA and obestiy who presents for follow up.  Devon Mathews was previously seen by Dr. Rollene Fare and by Dr. Einar Gip.  He last saw Dr. Einar Gip 01/2015 and reported mild, chronic dyspnea. He previously underwent cardiac catheterization in 2003 and 2006 with a 40% LAD lesion noted.  He had an ETT in 2007 that was negative for ischemia and a nuclear stress in 2011 and 2012 that were negative.  He had an echo 07/02/13 that revealed LVEF 55-60% and mild mitral regurgitation. He was seen in clinic 07/2016 and reported several months of chest pain and exertional shortness of breath.  He was referred for exercise Myoview 08/13/16 that revealed LVEF 63% and no ischemia.  He also had an echo 08/16/16 with LVEF 60-65%, mild LVH, mild AR and mild TR.  Devon Mathews had a sleep study in 2014 that demonstrated moderate OSA.  At his last appointment he noted that he was not using his CPAP machine.  He switched to nasal pillows and now uses it regularly.  He hasn't been exercising, which he attributes to laziness.  He denies chest pain but does have some exertional dyspnea and chest tightness.  This is unchanged from last year when he had his stress test.  He denies lower extremity edema, orthopnea or PND.  He has intermittent episodes of dizziness that typically occur when he first wakes up in the AM or when he is getting out of bed to use the bathroom at night.  He denies syncope and it never happens during the day.  The feeling lasts for 5-10 minutes at a time. Devon Mathews denies lower extremity edema, orthopnea, or PND.   Past Medical History:  Diagnosis Date  . CAD (coronary artery  disease)    Echo, EF =>55%, normal findings  . Chest pain 07/28/2016  . Chest pain, atypical 2012 & 2009   stress myo, EF 65% 2012/ stress myo, EF 65% 2009  . Dizziness 2012   normal findings..  . Essential hypertension 07/28/2016  . Lower extremity edema 07/28/2016  . Obesity   . Obesity (BMI 30-39.9) 07/28/2016  . OSA (obstructive sleep apnea) 07/28/2016  . Restless leg syndrome    mild  . Shortness of breath 07/28/2016    Past Surgical History:  Procedure Laterality Date  . CARDIAC CATHETERIZATION  2003   30-40% proximal segmental stenosis in the LAD  . CARDIAC CATHETERIZATION  2006   EF >50% & no mitral regurgitation     Current Outpatient Prescriptions  Medication Sig Dispense Refill  . amitriptyline (ELAVIL) 25 MG tablet Take 25 mg by mouth at bedtime as needed for sleep.    Marland Kitchen aspirin EC 81 MG tablet Take 81 mg by mouth daily.    . metoprolol succinate (TOPROL-XL) 25 MG 24 hr tablet Take 1 tablet (25 mg total) by mouth daily. 30 tablet 5  . pantoprazole (PROTONIX) 40 MG tablet TAKE ONE TABLET BY MOUTH ONE TIME DAILY. must make follow up appointment for further refills 15 tablet 0  . simvastatin (ZOCOR) 20 MG tablet Take 20 mg by mouth daily.  No current facility-administered medications for this visit.     Allergies:   Penicillins    Social History:  The patient  reports that he has never smoked. He has never used smokeless tobacco. He reports that he does not drink alcohol or use drugs.   Family History:  The patient's family history includes COPD in his father; Diabetes in his brother and sister; Heart attack in his paternal grandmother and sister; Heart disease in his father; Heart failure in his father, mother, and sister; Other in his father and maternal grandfather.    ROS:  Please see the history of present illness.   Otherwise, review of systems are positive for none.  All other systems are reviewed and negative.    PHYSICAL EXAM: VS:  BP 132/68    Pulse 70   Ht 5\' 10"  (1.778 m)   Wt 126.6 kg (279 lb 3.2 oz)   BMI 40.06 kg/m  , BMI Body mass index is 40.06 kg/m. GENERAL:  Well appearing.  No acute distress HEENT: Pupils equal round and reactive, fundi not visualized, oral mucosa unremarkable NECK:  No jugular venous distention, waveform within normal limits, carotid upstroke brisk and symmetric, no bruits, no thyromegaly LUNGS:  Clear to auscultation bilaterally HEART:  RRR.  PMI not displaced or sustained,S1 and S2 within normal limits, no S3, no S4, no clicks, no rubs, no murmurs ABD:  Central adiposity.  Umbilcal hernia. positive bowel sounds normal in frequency in pitch, no bruits, no rebound, no guarding, no midline pulsatile mass, no hepatomegaly, no splenomegaly EXT:  2 plus pulses throughout, no edema, no cyanosis no clubbing SKIN:  No rashes no nodules NEURO:  Cranial nerves II through XII grossly intact, motor grossly intact throughout PSYCH:  Cognitively intact, oriented to person place and time   EKG:  EKG is ordered today. The ekg ordered today demonstrates sinus rhythm. Rate 72 bpm. 08/15/17:  Sinus rhythm rate 70 bpm.    Echo 08/16/16: LVEF 60-65%, mild LVH.  Mild AR, mild TR, trivial MR.  Echo 07/02/13: Study Conclusions  - Left ventricle: The cavity size was normal. Wall thickness was normal. Systolic function was normal. The estimated ejection fraction was in the range of 55% to 60%. Wall motion was normal; there were no regional wall motion abnormalities. Left ventricular diastolic function parameters were normal. - Mitral valve: Mild regurgitation. Valve area by pressure half-time: 2.06cm^2. - Atrial septum: No defect or patent foramen ovale was identified. Impressions:  - Normal 2D echo-doppler study for age.  Exercise Myoview 08/13/16:   The left ventricular ejection fraction is normal (55-65%).  Nuclear stress EF: 63%.  There was no ST segment deviation noted during  stress.  The study is normal.  This is a low risk study.    Persantine Myoview 12/15/10: LVEF 65%.  Negative for ischemia.   Recent Labs: No results found for requested labs within last 8760 hours.    Lipid Panel    Component Value Date/Time   CHOL 140 06/27/2013 0840   TRIG 103 06/27/2013 0840   HDL 41 06/27/2013 0840   CHOLHDL 3.4 06/27/2013 0840   VLDL 21 06/27/2013 0840   LDLCALC 78 06/27/2013 0840      Wt Readings from Last 3 Encounters:  08/15/17 126.6 kg (279 lb 3.2 oz)  08/30/16 124 kg (273 lb 6.4 oz)  08/12/16 122.9 kg (271 lb)     ASSESSMENT AND PLAN:   # Non-obstructive CAD:  # Chest pain:  Devon Mathews has  a known 40% LAD lesion.  Exercise Myoview was negative for ischemia 07/2016.  His symptoms are unchanged since that time.  He will restart aspirin 81mg  daily.  Continue simvastatin and metoprolol.  We will get a copy of his lipids from his PCP.  # Dizziness: Symptoms only happen after getting up from bed.  Recommended that he take his time.  No syncope.   # Hypertension: Blood pressure was initially elevated but better on repeat.  Continue metoprolol.  # Obesity: We discussed the importance of dietary changes and increased exercise.  He expressed understanding.    # OSA: Devon Mathews had moderate OSA on sleep study.  He was again encouraged to wear his CPAP machine.  # LE edema: # SOB:  Echo is negative for heart failure. Symptoms have improved.    # Morbid obesity: Recommended that he increase his exercise to at least 3-4 times per week.   Current medicines are reviewed at length with the patient today.  The patient does not have concerns regarding medicines.  The following changes have been made:  None.  Labs/ tests ordered today include:   No orders of the defined types were placed in this encounter.    Disposition:   FU with Yeng Perz C. Oval [name], MD, [facility] in 1 year.    Signed, Markee Remlinger C. Oval [name], MD, [facility]  08/15/2017 5:57 PM    [place]

## 2017-08-30 ENCOUNTER — Ambulatory Visit: Payer: BLUE CROSS/BLUE SHIELD | Admitting: Cardiovascular Disease

## 2017-10-06 DIAGNOSIS — M1711 Unilateral primary osteoarthritis, right knee: Secondary | ICD-10-CM | POA: Diagnosis not present

## 2017-10-17 DIAGNOSIS — R7301 Impaired fasting glucose: Secondary | ICD-10-CM | POA: Diagnosis not present

## 2017-10-17 DIAGNOSIS — Z0000 Encounter for general adult medical examination without abnormal findings: Secondary | ICD-10-CM | POA: Diagnosis not present

## 2017-10-17 DIAGNOSIS — I10 Essential (primary) hypertension: Secondary | ICD-10-CM | POA: Diagnosis not present

## 2017-10-17 DIAGNOSIS — R82998 Other abnormal findings in urine: Secondary | ICD-10-CM | POA: Diagnosis not present

## 2017-10-17 DIAGNOSIS — Z125 Encounter for screening for malignant neoplasm of prostate: Secondary | ICD-10-CM | POA: Diagnosis not present

## 2017-10-17 DIAGNOSIS — M109 Gout, unspecified: Secondary | ICD-10-CM | POA: Diagnosis not present

## 2017-10-21 DIAGNOSIS — Z1389 Encounter for screening for other disorder: Secondary | ICD-10-CM | POA: Diagnosis not present

## 2017-10-21 DIAGNOSIS — E668 Other obesity: Secondary | ICD-10-CM | POA: Diagnosis not present

## 2017-10-21 DIAGNOSIS — M109 Gout, unspecified: Secondary | ICD-10-CM | POA: Diagnosis not present

## 2017-10-21 DIAGNOSIS — I2510 Atherosclerotic heart disease of native coronary artery without angina pectoris: Secondary | ICD-10-CM | POA: Diagnosis not present

## 2017-10-21 DIAGNOSIS — Z23 Encounter for immunization: Secondary | ICD-10-CM | POA: Diagnosis not present

## 2017-10-21 DIAGNOSIS — M1990 Unspecified osteoarthritis, unspecified site: Secondary | ICD-10-CM | POA: Diagnosis not present

## 2017-10-21 DIAGNOSIS — Z0000 Encounter for general adult medical examination without abnormal findings: Secondary | ICD-10-CM | POA: Diagnosis not present

## 2017-10-21 DIAGNOSIS — Z6841 Body Mass Index (BMI) 40.0 and over, adult: Secondary | ICD-10-CM | POA: Diagnosis not present

## 2017-10-27 DIAGNOSIS — Z1212 Encounter for screening for malignant neoplasm of rectum: Secondary | ICD-10-CM | POA: Diagnosis not present

## 2017-10-31 DIAGNOSIS — H5203 Hypermetropia, bilateral: Secondary | ICD-10-CM | POA: Diagnosis not present

## 2017-10-31 DIAGNOSIS — H2513 Age-related nuclear cataract, bilateral: Secondary | ICD-10-CM | POA: Diagnosis not present

## 2017-10-31 DIAGNOSIS — H3500 Unspecified background retinopathy: Secondary | ICD-10-CM | POA: Diagnosis not present

## 2017-11-01 ENCOUNTER — Telehealth: Payer: Self-pay | Admitting: *Deleted

## 2017-11-01 DIAGNOSIS — S83241A Other tear of medial meniscus, current injury, right knee, initial encounter: Secondary | ICD-10-CM | POA: Diagnosis not present

## 2017-11-01 DIAGNOSIS — M1711 Unilateral primary osteoarthritis, right knee: Secondary | ICD-10-CM | POA: Diagnosis not present

## 2017-11-01 NOTE — Telephone Encounter (Signed)
    [facility] Pre-operative Risk Assessment    Request for surgical clearance:  1. What type of surgery is being performed? RIGHT KNEE ARTHROSCOPIC PARTIAL & MEDIAL MENISCECTOMY   2. When is this surgery scheduled? PENDING   3. Are there any medications that need to be held prior to surgery and how long?NONE   4. Practice name and name of physician performing surgery? [facility]   5. What is your office phone and fax number? PH=[phone] FAX=[phone]   6. Anesthesia type (None, local, MAC, general) ? UNKNOWN   Devon Mathews 11/01/2017, 5:16 PM  _________________________________________________________________   (provider comments below)

## 2017-11-02 ENCOUNTER — Telehealth: Payer: Self-pay | Admitting: *Deleted

## 2017-11-02 DIAGNOSIS — E785 Hyperlipidemia, unspecified: Secondary | ICD-10-CM

## 2017-11-02 DIAGNOSIS — I10 Essential (primary) hypertension: Secondary | ICD-10-CM

## 2017-11-02 MED ORDER — SIMVASTATIN 40 MG PO TABS: 40.0000 mg | ORAL_TABLET | Freq: Every day | ORAL | 5 refills | Status: DC

## 2017-11-02 NOTE — Telephone Encounter (Signed)
Advised patient of lab results, rx sent to [facility] and mailed lab order forms

## 2017-11-02 NOTE — Telephone Encounter (Signed)
   Primary Cardiologist: No primary care provider on file.  Chart reviewed as part of pre-operative protocol coverage. Patient was contacted 11/02/2017 in reference to pre-operative risk assessment for pending surgery as outlined below.  Devon Mathews was last seen on 08/15/17 by Dr. Oval [name].  Since that day, Devon Mathews has done well. He is walking as his knee pain allows and is currently building a gazebo at his house - he is doing so without anginal symptoms. His DASI is greater than 5 METS.  Therefore, based on ACC/AHA guidelines, the patient would be at acceptable risk for the planned procedure without further cardiovascular testing.   I will route this recommendation to Dr. Oval [name] to provide recommendation regarding holding ASA prior to surgery.  Tami Lin Saraya Tirey, PA 11/02/2017, 2:22 PM

## 2017-11-02 NOTE — Telephone Encounter (Signed)
-----   Message from Skeet Latch, MD sent at 10/31/2017 10:05 AM EST ----- Labs reviewed.  LDL or "bad" cholesterol is pretty good at 86 but should be <70.  Increase simvastatin to 40mg  daily and repeat lipids and CMP in 6-8 weeks.

## 2017-11-03 NOTE — Telephone Encounter (Signed)
Devon Mathews may hold aspirin 5 days prior to surgery.

## 2017-11-04 NOTE — Telephone Encounter (Signed)
   Pt cleared for surgery 11/02/17 (see below). Dr. Oval [name] is ok with pt holding ASA 5 days prior to surgery as outlined below. I will fax clearance notification to Dr. Maxie Better, [phone]. RN to notify pt.   Brittainy Rosita Fire

## 2017-11-04 NOTE — Telephone Encounter (Signed)
I s/w pt to advise he is cleared for surgery and will need to hold ASA 5 days prior to surgery. Pt aware and is agreeable to plan of care. I lmom for Sherri at [place] will fax over clearance today with recommendations.

## 2017-11-07 NOTE — Telephone Encounter (Signed)
Sent via Epic

## 2017-11-14 DIAGNOSIS — M2241 Chondromalacia patellae, right knee: Secondary | ICD-10-CM | POA: Diagnosis not present

## 2017-11-14 DIAGNOSIS — S83231A Complex tear of medial meniscus, current injury, right knee, initial encounter: Secondary | ICD-10-CM | POA: Diagnosis not present

## 2017-11-14 DIAGNOSIS — S83241A Other tear of medial meniscus, current injury, right knee, initial encounter: Secondary | ICD-10-CM | POA: Diagnosis not present

## 2017-11-14 DIAGNOSIS — M659 Synovitis and tenosynovitis, unspecified: Secondary | ICD-10-CM | POA: Diagnosis not present

## 2017-11-14 DIAGNOSIS — M94261 Chondromalacia, right knee: Secondary | ICD-10-CM | POA: Diagnosis not present

## 2017-11-14 DIAGNOSIS — X58XXXA Exposure to other specified factors, initial encounter: Secondary | ICD-10-CM | POA: Diagnosis not present

## 2017-11-14 DIAGNOSIS — G8918 Other acute postprocedural pain: Secondary | ICD-10-CM | POA: Diagnosis not present

## 2017-11-14 DIAGNOSIS — Y999 Unspecified external cause status: Secondary | ICD-10-CM | POA: Diagnosis not present

## 2018-03-16 DIAGNOSIS — K635 Polyp of colon: Secondary | ICD-10-CM | POA: Diagnosis not present

## 2018-03-16 DIAGNOSIS — D124 Benign neoplasm of descending colon: Secondary | ICD-10-CM | POA: Diagnosis not present

## 2018-03-16 DIAGNOSIS — Z1211 Encounter for screening for malignant neoplasm of colon: Secondary | ICD-10-CM | POA: Diagnosis not present

## 2018-03-16 DIAGNOSIS — D123 Benign neoplasm of transverse colon: Secondary | ICD-10-CM | POA: Diagnosis not present

## 2018-03-16 DIAGNOSIS — D120 Benign neoplasm of cecum: Secondary | ICD-10-CM | POA: Diagnosis not present

## 2018-03-16 DIAGNOSIS — D125 Benign neoplasm of sigmoid colon: Secondary | ICD-10-CM | POA: Diagnosis not present

## 2018-03-31 DIAGNOSIS — M7541 Impingement syndrome of right shoulder: Secondary | ICD-10-CM | POA: Diagnosis not present

## 2018-03-31 DIAGNOSIS — M19011 Primary osteoarthritis, right shoulder: Secondary | ICD-10-CM | POA: Diagnosis not present

## 2018-04-18 DIAGNOSIS — E7800 Pure hypercholesterolemia, unspecified: Secondary | ICD-10-CM | POA: Diagnosis not present

## 2018-04-18 DIAGNOSIS — D126 Benign neoplasm of colon, unspecified: Secondary | ICD-10-CM | POA: Diagnosis not present

## 2018-04-18 DIAGNOSIS — R7301 Impaired fasting glucose: Secondary | ICD-10-CM | POA: Diagnosis not present

## 2018-04-18 DIAGNOSIS — G4733 Obstructive sleep apnea (adult) (pediatric): Secondary | ICD-10-CM | POA: Diagnosis not present

## 2018-04-18 DIAGNOSIS — I119 Hypertensive heart disease without heart failure: Secondary | ICD-10-CM | POA: Diagnosis not present

## 2018-04-27 DIAGNOSIS — G4733 Obstructive sleep apnea (adult) (pediatric): Secondary | ICD-10-CM | POA: Diagnosis not present

## 2018-06-15 DIAGNOSIS — M25511 Pain in right shoulder: Secondary | ICD-10-CM | POA: Diagnosis not present

## 2018-06-15 DIAGNOSIS — M7541 Impingement syndrome of right shoulder: Secondary | ICD-10-CM | POA: Diagnosis not present

## 2018-08-21 ENCOUNTER — Telehealth: Payer: Self-pay | Admitting: *Deleted

## 2018-08-21 ENCOUNTER — Encounter: Payer: Self-pay | Admitting: Cardiovascular Disease

## 2018-08-21 ENCOUNTER — Ambulatory Visit: Payer: BLUE CROSS/BLUE SHIELD | Admitting: Cardiovascular Disease

## 2018-08-21 VITALS — BP 144/78 | HR 78 | Ht 70.0 in | Wt 286.6 lb

## 2018-08-21 DIAGNOSIS — G4733 Obstructive sleep apnea (adult) (pediatric): Secondary | ICD-10-CM

## 2018-08-21 DIAGNOSIS — E6609 Other obesity due to excess calories: Secondary | ICD-10-CM

## 2018-08-21 DIAGNOSIS — I2510 Atherosclerotic heart disease of native coronary artery without angina pectoris: Secondary | ICD-10-CM | POA: Diagnosis not present

## 2018-08-21 DIAGNOSIS — R0602 Shortness of breath: Secondary | ICD-10-CM

## 2018-08-21 DIAGNOSIS — I739 Peripheral vascular disease, unspecified: Secondary | ICD-10-CM | POA: Diagnosis not present

## 2018-08-21 DIAGNOSIS — I10 Essential (primary) hypertension: Secondary | ICD-10-CM

## 2018-08-21 DIAGNOSIS — I119 Hypertensive heart disease without heart failure: Secondary | ICD-10-CM

## 2018-08-21 DIAGNOSIS — E66812 Obesity, class 2: Secondary | ICD-10-CM

## 2018-08-21 DIAGNOSIS — E785 Hyperlipidemia, unspecified: Secondary | ICD-10-CM

## 2018-08-21 NOTE — Telephone Encounter (Signed)
Patient scheduled to see Dr Oval [name] this afternoon at 3:40. She has meeting and have had couple of cancellations. Left message to call back to see if patient could come in earlier.

## 2018-08-21 NOTE — Telephone Encounter (Signed)
Patient seen today in clinic.

## 2018-08-21 NOTE — Progress Notes (Signed)
Cardiology Office Note   Date:  08/21/2018   ID:  Devon Mathews, DOB [birth year], MRN [id number]  PCP:  Haywood Pao, MD  Cardiologist:   Skeet Latch, MD   No chief complaint on file.   History of Present Illness: Devon Mathews is a 67 y.o. male with nonobstructive CAD, mild AR, hypertension, hyperlipidemia, OSA and obestiy who presents for follow up.  Devon Mathews was previously seen by Devon Mathews and by Devon Mathews.  He last saw Devon Mathews 01/2015 and reported mild, chronic dyspnea. He previously underwent cardiac catheterization in 2003 and 2006 with a 40% LAD lesion noted.  He had an ETT in 2007 that was negative for ischemia and a nuclear stress in 2011 and 2012 that were negative.  He had an echo 07/02/13 that revealed LVEF 55-60% and mild mitral regurgitation. He was seen in clinic 07/2016 and reported several months of chest pain and exertional shortness of breath.  He was referred for exercise Myoview 08/13/16 that revealed LVEF 63% and no ischemia.  He also had an echo 08/16/16 with LVEF 60-65%, mild LVH, mild AR and mild TR.   Devon Mathews had a sleep study in 2014 that demonstrated moderate OSA. He continues to be compliant with CPAP.  Since his last appointment simvastatin was increased due to LDL 86.  It improved to 71 on follow up.  Devon Mathews has been feeling well.  He has rare episodes of sharp chest pain that occur when sitting on the couch.  He has no exertional chest pain, though he has not been exercising.  He gets short of breath walking up inclines.  He reports pain in his R groin when he tries to lift his leg.  He sometimes has swelling in his L ankle that he attributes to prior surgery but has no other edema, orthopnea or PND.  His legs sometimes feel tight and he has pain in his legs.  He has it both at rest and when walking.  He reports shortness of breath walking up inclines but no chest pain.  He isn't exercising because he is too buty, but he is trying to reduce sodium intake  and cutting back his portions.  He does have [place] every morning and often [employer] for lunch.  He also notes that his memory has been poor lately.   Past Medical History:  Diagnosis Date  . CAD (coronary artery disease)    Echo, EF =>55%, normal findings  . Chest pain 07/28/2016  . Chest pain, atypical 2012 & 2009   stress myo, EF 65% 2012/ stress myo, EF 65% 2009  . Dizziness 2012   normal findings..  . Essential hypertension 07/28/2016  . Lower extremity edema 07/28/2016  . Obesity   . Obesity (BMI 30-39.9) 07/28/2016  . OSA (obstructive sleep apnea) 07/28/2016  . Restless leg syndrome    mild  . Shortness of breath 07/28/2016      Current Outpatient Medications  Medication Sig Dispense Refill  . amitriptyline (ELAVIL) 25 MG tablet Take 25 mg by mouth at bedtime as needed for sleep.    . metoprolol succinate (TOPROL-XL) 25 MG 24 hr tablet Take 1 tablet (25 mg total) by mouth daily. 30 tablet 5  . pantoprazole (PROTONIX) 40 MG tablet TAKE ONE TABLET BY MOUTH ONE TIME DAILY. must make follow up appointment for further refills 15 tablet 0  . simvastatin (ZOCOR) 40 MG tablet Take 1 tablet (40 mg total) by mouth daily. [place]  tablet 5  . aspirin EC 81 MG tablet Take 81 mg by mouth daily.     No current facility-administered medications for this visit.     Allergies:   Penicillins    Social History:  The patient  reports that he has never smoked. He has never used smokeless tobacco. He reports that he does not drink alcohol or use drugs.   Family History:  The patient's family history includes COPD in his father; Diabetes in his brother and sister; Heart attack in his paternal grandmother and sister; Heart disease in his father; Heart failure in his father, mother, and sister; Other in his father and maternal grandfather.    ROS:  Please see the history of present illness.   Otherwise, review of systems are positive for none.  All other systems are reviewed and negative.      PHYSICAL EXAM: VS:  BP (!) 144/78   Pulse 78   Ht 5\' 10"  (1.778 m)   Wt 286 lb 9.6 oz (130 kg)   BMI 41.12 kg/m  , BMI Body mass index is 41.12 kg/m. GENERAL:  Well appearing HEENT: Pupils equal round and reactive, fundi not visualized, oral mucosa unremarkable NECK:  No jugular venous distention, waveform within normal limits, carotid upstroke brisk and symmetric, no bruits, no thyromegaly LUNGS:  Clear to auscultation bilaterally HEART:  RRR.  PMI not displaced or sustained,S1 and S2 within normal limits, no S3, no S4, no clicks, no rubs, no murmurs ABD:  Flat, positive bowel sounds normal in frequency in pitch, no bruits, no rebound, no guarding, no midline pulsatile mass, no hepatomegaly, no splenomegaly EXT:  2 plus pulses throughout, no edema, no cyanosis no clubbing SKIN:  No rashes no nodules NEURO:  Cranial nerves II through XII grossly intact, motor grossly intact throughout PSYCH:  Cognitively intact, oriented to person place and time   EKG:  EKG is ordered today. The ekg ordered today demonstrates sinus rhythm. Rate 72 bpm. 08/15/17:  Sinus rhythm rate 70 bpm.   08/21/18: Sinus rhythm.  Rate 78 bpm.    Echo 08/16/16: LVEF 60-65%, mild LVH.  Mild AR, mild TR, trivial MR.  Echo 07/02/13: Study Conclusions  - Left ventricle: The cavity size was normal. Wall thickness was normal. Systolic function was normal. The estimated ejection fraction was in the range of 55% to 60%. Wall motion was normal; there were no regional wall motion abnormalities. Left ventricular diastolic function parameters were normal. - Mitral valve: Mild regurgitation. Valve area by pressure half-time: 2.06cm^2. - Atrial septum: No defect or patent foramen ovale was identified. Impressions:  - Normal 2D echo-doppler study for age.  Exercise Myoview 08/13/16:   The left ventricular ejection fraction is normal (55-65%).  Nuclear stress EF: 63%.  There was no ST segment  deviation noted during stress.  The study is normal.  This is a low risk study.    Persantine Myoview 12/15/10: LVEF 65%.  Negative for ischemia.   Recent Labs: No results found for requested labs within last 8760 hours.    Lipid Panel    Component Value Date/Time   CHOL 140 06/27/2013 0840   TRIG 103 06/27/2013 0840   HDL 41 06/27/2013 0840   CHOLHDL 3.4 06/27/2013 0840   VLDL 21 06/27/2013 0840   LDLCALC 78 06/27/2013 0840      Wt Readings from Last 3 Encounters:  08/21/18 286 lb 9.6 oz (130 kg)  08/15/17 279 lb 3.2 oz (126.6 kg)  08/30/16 273 lb  6.4 oz (124 kg)     ASSESSMENT AND PLAN:   # Non-obstructive CAD:  # Chest pain:  Devon Mathews has a known 40% LAD lesion.  Exercise Myoview was negative for ischemia 07/2016.  His symptoms are unchanged since that time.  Continue aspirin and metoprolol.  Hold simvastatin for 2 weeks to see if his memory improves.    # Hypertension: Blood pressure was initially elevated but better on repeat.  Continue metoprolol.  We discussed the importance of increasing his exercise and working on his diet.  His blood pressure remains above goal on follow-up we will need to make some adjustments.  # Obesity: We discussed the importance of dietary changes and increased exercise.  He expressed understanding.  Will refer to our care guide for diet and exercise coaching.  # OSA: Devon Mathews had moderate OSA on sleep study.  He was again encouraged to wear his CPAP machine.  # LE edema: # SOB:  Echo is negative for heart failure. Symptoms are likely attributable to obesity and deconditioning.    # Leg pain:  Check ABIs.  His groin pain seem to be 2/2 hip OA.   Current medicines are reviewed at length with the patient today.  The patient does not have concerns regarding medicines.  The following changes have been made:  None.  Labs/ tests ordered today include:   No orders of the defined types were placed in this encounter.    Disposition:    FU with Devon Minner C. Oval [name], MD, [facility] in 3 months.     Signed, Devon Ohlinger C. Oval [name], MD, [facility]  08/21/2018 3:38 PM    [place]

## 2018-08-21 NOTE — Patient Instructions (Addendum)
Medication Instructions:  RESUME ASPIRIN 81 MG DAILY   If you need a refill on your cardiac medications before your next appointment, please call your pharmacy.   Lab work: NONE   Testing/Procedures: Your physician has requested that you have an ankle brachial index (ABI). During this test an ultrasound and blood pressure cuff are used to evaluate the arteries that supply the arms and legs with blood. Allow thirty minutes for this exam. There are no restrictions or special instructions.  Your physician has requested that you have a lower or upper extremity arterial duplex. This test is an ultrasound of the arteries in the legs or arms. It looks at arterial blood flow in the legs and arms. Allow one hour for Lower and Upper Arterial scans. There are no restrictions or special instructions   Follow-Up: At [facility], you and your health needs are our priority.  As part of our continuing mission to provide you with exceptional heart care, we have created designated Provider Care Teams.  These Care Teams include your primary Cardiologist (physician) and Advanced Practice Providers (APPs -  Physician Assistants and Nurse Practitioners) who all work together to provide you with the care you need, when you need it. You will need a follow up appointment in 3 months.  You may see DR [facility]  or one of the following Advanced Practice Providers on your designated Care Team:   Bunny Lowdermilk, PA-C Roby Lofts, [place] . Sande Rives, PA-C  You have been referred to [facility], SHE WILL BE IN TOUCH. IF YOU DO NOT HEAR FROM HER BY MID WEEK NEXT WEEK CALL THE OFFICE   Any Other Special Instructions Will Be Listed Below (If Applicable). WORK ON DIET AND EXERCISE

## 2018-08-23 ENCOUNTER — Encounter: Payer: Self-pay | Admitting: Cardiovascular Disease

## 2018-09-01 ENCOUNTER — Ambulatory Visit (HOSPITAL_COMMUNITY)
Admission: RE | Admit: 2018-09-01 | Discharge: 2018-09-01 | Disposition: A | Discharge status: Home / Self Care | Payer: BLUE CROSS/BLUE SHIELD | Source: Ambulatory Visit | Attending: Cardiology | Admitting: Cardiology

## 2018-09-01 ENCOUNTER — Encounter (HOSPITAL_COMMUNITY): Payer: BLUE CROSS/BLUE SHIELD

## 2018-09-01 DIAGNOSIS — I739 Peripheral vascular disease, unspecified: Secondary | ICD-10-CM | POA: Insufficient documentation

## 2018-09-12 ENCOUNTER — Emergency Department (HOSPITAL_COMMUNITY): Payer: BLUE CROSS/BLUE SHIELD

## 2018-09-12 ENCOUNTER — Inpatient Hospital Stay (HOSPITAL_COMMUNITY)
Admission: EM | Admit: 2018-09-12 | Discharge: 2018-09-14 | DRG: 247 | Disposition: A | Discharge status: Home / Self Care | Payer: BLUE CROSS/BLUE SHIELD | Attending: Internal Medicine | Admitting: Internal Medicine

## 2018-09-12 ENCOUNTER — Encounter (HOSPITAL_COMMUNITY): Payer: Self-pay | Admitting: *Deleted

## 2018-09-12 ENCOUNTER — Other Ambulatory Visit: Payer: Self-pay

## 2018-09-12 DIAGNOSIS — E785 Hyperlipidemia, unspecified: Secondary | ICD-10-CM

## 2018-09-12 DIAGNOSIS — R072 Precordial pain: Secondary | ICD-10-CM | POA: Diagnosis not present

## 2018-09-12 DIAGNOSIS — Z8249 Family history of ischemic heart disease and other diseases of the circulatory system: Secondary | ICD-10-CM

## 2018-09-12 DIAGNOSIS — Z825 Family history of asthma and other chronic lower respiratory diseases: Secondary | ICD-10-CM

## 2018-09-12 DIAGNOSIS — I10 Essential (primary) hypertension: Secondary | ICD-10-CM | POA: Diagnosis not present

## 2018-09-12 DIAGNOSIS — I25110 Atherosclerotic heart disease of native coronary artery with unstable angina pectoris: Secondary | ICD-10-CM | POA: Diagnosis not present

## 2018-09-12 DIAGNOSIS — E781 Pure hyperglyceridemia: Secondary | ICD-10-CM | POA: Diagnosis not present

## 2018-09-12 DIAGNOSIS — I499 Cardiac arrhythmia, unspecified: Secondary | ICD-10-CM | POA: Diagnosis not present

## 2018-09-12 DIAGNOSIS — I2510 Atherosclerotic heart disease of native coronary artery without angina pectoris: Secondary | ICD-10-CM | POA: Diagnosis not present

## 2018-09-12 DIAGNOSIS — Z9861 Coronary angioplasty status: Secondary | ICD-10-CM

## 2018-09-12 DIAGNOSIS — G4733 Obstructive sleep apnea (adult) (pediatric): Secondary | ICD-10-CM | POA: Diagnosis present

## 2018-09-12 DIAGNOSIS — Z7982 Long term (current) use of aspirin: Secondary | ICD-10-CM | POA: Diagnosis not present

## 2018-09-12 DIAGNOSIS — R0902 Hypoxemia: Secondary | ICD-10-CM | POA: Diagnosis not present

## 2018-09-12 DIAGNOSIS — E669 Obesity, unspecified: Secondary | ICD-10-CM | POA: Diagnosis not present

## 2018-09-12 DIAGNOSIS — M25511 Pain in right shoulder: Secondary | ICD-10-CM | POA: Diagnosis not present

## 2018-09-12 DIAGNOSIS — Z833 Family history of diabetes mellitus: Secondary | ICD-10-CM

## 2018-09-12 DIAGNOSIS — K219 Gastro-esophageal reflux disease without esophagitis: Secondary | ICD-10-CM | POA: Diagnosis present

## 2018-09-12 DIAGNOSIS — Z6841 Body Mass Index (BMI) 40.0 and over, adult: Secondary | ICD-10-CM

## 2018-09-12 DIAGNOSIS — Z880 Allergy status to penicillin: Secondary | ICD-10-CM

## 2018-09-12 DIAGNOSIS — I214 Non-ST elevation (NSTEMI) myocardial infarction: Secondary | ICD-10-CM

## 2018-09-12 DIAGNOSIS — R0789 Other chest pain: Secondary | ICD-10-CM | POA: Diagnosis not present

## 2018-09-12 DIAGNOSIS — R079 Chest pain, unspecified: Secondary | ICD-10-CM | POA: Diagnosis not present

## 2018-09-12 DIAGNOSIS — I252 Old myocardial infarction: Secondary | ICD-10-CM

## 2018-09-12 LAB — CBC
HEMATOCRIT: 41.8 % (ref 39.0–52.0)
HEMOGLOBIN: 14.2 g/dL (ref 13.0–17.0)
MCH: 34.7 pg — AB (ref 26.0–34.0)
MCHC: 34 g/dL (ref 30.0–36.0)
MCV: 102.2 fL — ABNORMAL HIGH (ref 80.0–100.0)
Platelets: 148 10*3/uL — ABNORMAL LOW (ref 150–400)
RBC: 4.09 MIL/uL — ABNORMAL LOW (ref 4.22–5.81)
RDW: 12.5 % (ref 11.5–15.5)
WBC: 5.3 10*3/uL (ref 4.0–10.5)
nRBC: 0 % (ref 0.0–0.2)

## 2018-09-12 LAB — BASIC METABOLIC PANEL
Anion gap: 7 (ref 5–15)
BUN: 13 mg/dL (ref 8–23)
CHLORIDE: 106 mmol/L (ref 98–111)
CO2: 25 mmol/L (ref 22–32)
CREATININE: 1.18 mg/dL (ref 0.61–1.24)
Calcium: 9.5 mg/dL (ref 8.9–10.3)
GFR calc Af Amer: >60 mL/min (ref >60)
GFR calc non Af Amer: >60 mL/min (ref >60)
GLUCOSE: 137 mg/dL — AB (ref 70–99)
POTASSIUM: 4.7 mmol/L (ref 3.5–5.1)
Sodium: 138 mmol/L (ref 135–145)

## 2018-09-12 MED ORDER — FAMOTIDINE 20 MG PO TABS
20.0000 mg | ORAL_TABLET | Freq: Once | ORAL | Status: AC
  Administered 2018-09-12: 20 mg via ORAL
  Filled 2018-09-12: qty 1

## 2018-09-12 MED ORDER — ALUM & MAG HYDROXIDE-SIMETH 200-200-20 MG/5ML PO SUSP
15.0000 mL | Freq: Once | ORAL | Status: AC
  Administered 2018-09-12: 15 mL via ORAL
  Filled 2018-09-12: qty 30

## 2018-09-12 NOTE — ED Notes (Signed)
Pt ambulated to the BR with steady gait.   

## 2018-09-12 NOTE — ED Triage Notes (Signed)
Per EMS, pt from home, called out for cp, pain resolved upon arrival on scene.  Pt is A&O x 4.  In NAD.  Hx of GERD.  Cards is Dr., Oval [name].  ASA 324mg  PO

## 2018-09-12 NOTE — ED Notes (Signed)
Pt reports pressure in his chest became worse when he returned to his room after using the BR.  Pt rates the cp at 3/10.  New EKG done and showed to [facility] EDP.  Pt reports cp is 1/10 before administering meds as ordered.

## 2018-09-12 NOTE — ED Provider Notes (Signed)
[facility] EMERGENCY DEPARTMENT Provider Note   CSN: [id number] Arrival date & time: 09/12/18  2139     History   Chief Complaint Chief Complaint  Patient presents with  . Chest Pain    HPI Devon Mathews is a 67 y.o. male.  Patient c/o mid chest pain tonight at rest. Pain midline, dull twinge, not radiating. No associated nv, diaphoresis or sob. Pain not pleuritic. Denies other recent chest pain or any exertional cp. No unusual doe or fatigue. No leg pain or swelling. States prior caths w minimal cad (40% lesion). EMS notes repetitive burping. Pt notes hx gerd. Denies cough or uri symptoms. No fever or chills.   The history is provided by the patient and the EMS personnel.  Chest Pain   Pertinent negatives include no abdominal pain, no back pain, no cough, no fever, no headaches, no shortness of breath and no vomiting.    Past Medical History:  Diagnosis Date  . CAD (coronary artery disease)    Echo, EF =>55%, normal findings  . Chest pain 07/28/2016  . Chest pain, atypical 2012 & 2009   stress myo, EF 65% 2012/ stress myo, EF 65% 2009  . Dizziness 2012   normal findings..  . Essential hypertension 07/28/2016  . Lower extremity edema 07/28/2016  . Obesity   . Obesity (BMI 30-39.9) 07/28/2016  . OSA (obstructive sleep apnea) 07/28/2016  . Restless leg syndrome    mild  . Shortness of breath 07/28/2016    Patient Active Problem List   Diagnosis Date Noted  . CAD (coronary artery disease) 07/28/2016  . Chest pain 07/28/2016  . Essential hypertension 07/28/2016  . Obesity (BMI 30-39.9) 07/28/2016  . Lower extremity edema 07/28/2016  . Shortness of breath 07/28/2016  . OSA (obstructive sleep apnea) 07/28/2016    Past Surgical History:  Procedure Laterality Date  . CARDIAC CATHETERIZATION  2003   30-40% proximal segmental stenosis in the LAD  . CARDIAC CATHETERIZATION  2006   EF >50% & no mitral regurgitation        Home Medications     Prior to Admission medications   Medication Sig Start Date End Date Taking? Authorizing Provider  amitriptyline (ELAVIL) 25 MG tablet Take 25 mg by mouth at bedtime as needed for sleep.    [provider]  aspirin EC 81 MG tablet Take 81 mg by mouth daily.    [provider]  metoprolol succinate (TOPROL-XL) 25 MG 24 hr tablet Take 1 tablet (25 mg total) by mouth daily. 01/21/14   [name], MD  pantoprazole (PROTONIX) 40 MG tablet TAKE ONE TABLET BY MOUTH ONE TIME DAILY. must make follow up appointment for further refills 10/07/14   [name], MD  simvastatin (ZOCOR) 40 MG tablet Take 1 tablet (40 mg total) by mouth daily. 11/02/17   Skeet Latch, MD    Family History Family History  Problem Relation Age of Onset  . Heart failure Mother   . Other Father        bypass surgery  . Heart disease Father   . Heart failure Father   . COPD Father   . Heart attack Sister   . Diabetes Sister   . Heart failure Sister   . Diabetes Brother   . Other Maternal Grandfather        smoking related disease  . Heart attack Paternal Grandmother     Social History Social History   Tobacco Use  .  Smoking status: Never Smoker  . Smokeless tobacco: Never Used  Substance Use Topics  . Alcohol use: No  . Drug use: No     Allergies   Penicillins   Review of Systems Review of Systems  Constitutional: Negative for fever.  HENT: Negative for sore throat.   Eyes: Negative for redness.  Respiratory: Negative for cough and shortness of breath.   Cardiovascular: Positive for chest pain. Negative for leg swelling.  Gastrointestinal: Negative for abdominal pain and vomiting.  Genitourinary: Negative for flank pain.  Musculoskeletal: Negative for back pain and neck pain.  Skin: Negative for rash.  Neurological: Negative for headaches.  Hematological: Does not bruise/bleed easily.  Psychiatric/Behavioral: Negative for confusion.     Physical Exam Updated  Vital Signs There were no vitals taken for this visit.  Physical Exam  Constitutional: He appears well-developed and well-nourished.  HENT:  Mouth/Throat: Oropharynx is clear and moist.  Eyes: Conjunctivae are normal.  Neck: Neck supple. No tracheal deviation present.  Cardiovascular: Normal rate, regular rhythm, normal heart sounds and intact distal pulses. Exam reveals no friction rub.  No murmur heard. Pulmonary/Chest: Effort normal and breath sounds normal. No accessory muscle usage. No respiratory distress.  Abdominal: Soft. Bowel sounds are normal. He exhibits no distension. There is no tenderness.  Genitourinary:  Genitourinary Comments: No cva tenderness.   Musculoskeletal: He exhibits no edema or tenderness.  Neurological: He is alert.  Skin: Skin is warm and dry. No rash noted.  Psychiatric: He has a normal mood and affect.  Nursing note and vitals reviewed.    ED Treatments / Results  Labs (all labs ordered are listed, but only abnormal results are displayed) Results for orders placed or performed during the hospital encounter of 92/11/94  Basic metabolic panel  Result Value Ref Range   Sodium 138 135 - 145 mmol/L   Potassium 4.7 3.5 - 5.1 mmol/L   Chloride 106 98 - 111 mmol/L   CO2 25 22 - 32 mmol/L   Glucose, Bld 137 (H) 70 - 99 mg/dL   BUN 13 8 - 23 mg/dL   Creatinine, Ser 1.18 0.61 - 1.24 mg/dL   Calcium 9.5 8.9 - 10.3 mg/dL   GFR calc non Af Amer >60 >60 mL/min   GFR calc Af Amer >60 >60 mL/min   Anion gap 7 5 - 15  CBC  Result Value Ref Range   WBC 5.3 4.0 - 10.5 K/uL   RBC 4.09 (L) 4.22 - 5.81 MIL/uL   Hemoglobin 14.2 13.0 - 17.0 g/dL   HCT 41.8 39.0 - 52.0 %   MCV 102.2 (H) 80.0 - 100.0 fL   MCH 34.7 (H) 26.0 - 34.0 pg   MCHC 34.0 30.0 - 36.0 g/dL   RDW 12.5 11.5 - 15.5 %   Platelets 148 (L) 150 - 400 K/uL   nRBC 0.0 0.0 - 0.2 %    EKG EKG Interpretation  Date/Time:  Tuesday September 12 2018 21:58:19 EST Ventricular Rate:  80 PR  Interval:    QRS Duration: 91 QT Interval:  377 QTC Calculation: 435 R Axis:   63 Text Interpretation:  Sinus rhythm No significant change since last tracing Confirmed by Lajean Saver [phone]) on 09/12/2018 11:05:16 PM   Radiology Dg Chest 2 View  Result Date: 09/12/2018 CLINICAL DATA:  Chest pain EXAM: CHEST - 2 VIEW COMPARISON:  06/23/2008 FINDINGS: No acute opacity or pleural effusion. Mild cardiomegaly. Degenerative osteophytes of the spine. IMPRESSION: No active cardiopulmonary disease.  Mild  cardiomegaly Electronically Signed   By: Donavan Foil M.D.   On: 09/12/2018 23:01    Procedures Procedures (including critical care time)  Medications Ordered in ED Medications  famotidine (PEPCID) tablet 20 mg (has no administration in time range)  alum & mag hydroxide-simeth (MAALOX/MYLANTA) 200-200-20 MG/5ML suspension 15 mL (has no administration in time range)     Initial Impression / Assessment and Plan / ED Course  I have reviewed the triage vital signs and the nursing notes.  Pertinent labs & imaging results that were available during my care of the patient were reviewed by me and considered in my medical decision making (see chart for details).  Iv ns. Labs. Ecg. Cxr.  Reviewed nursing notes and prior charts for additional history. Prior cardiac cath - minimal cad, 40% lesion, felt to have non cardiac chest pain then - that cath was 10+ yrs ago.  Given recent gi symptoms, pepcid and maalox for symptom relief.  Await labs.   Initial troponin not crossing over - checked with mini-lab, result is 0.  Recheck pt, comfortable, nad, no sob. Will plan to get delta troponin.   cxr reviewed - no pna.  2305, signed out to Dr Kathrynn Humble - check delta troponin - if normal/not increasing and pain free, feel stable then for outpt f/u with cardiology in the coming week.     Final Clinical Impressions(s) / ED Diagnoses   Final diagnoses:  None    ED Discharge Orders    None        Lajean Saver, MD 09/12/18 2306

## 2018-09-13 ENCOUNTER — Other Ambulatory Visit: Payer: Self-pay

## 2018-09-13 ENCOUNTER — Encounter (HOSPITAL_COMMUNITY): Payer: Self-pay | Admitting: Emergency Medicine

## 2018-09-13 ENCOUNTER — Encounter (HOSPITAL_COMMUNITY)
Admission: EM | Disposition: A | Discharge status: Home / Self Care | Payer: Self-pay | Source: Home / Self Care | Attending: Internal Medicine

## 2018-09-13 DIAGNOSIS — I25110 Atherosclerotic heart disease of native coronary artery with unstable angina pectoris: Secondary | ICD-10-CM | POA: Diagnosis not present

## 2018-09-13 DIAGNOSIS — I2510 Atherosclerotic heart disease of native coronary artery without angina pectoris: Secondary | ICD-10-CM

## 2018-09-13 DIAGNOSIS — Z8249 Family history of ischemic heart disease and other diseases of the circulatory system: Secondary | ICD-10-CM | POA: Diagnosis not present

## 2018-09-13 DIAGNOSIS — Z880 Allergy status to penicillin: Secondary | ICD-10-CM | POA: Diagnosis not present

## 2018-09-13 DIAGNOSIS — K219 Gastro-esophageal reflux disease without esophagitis: Secondary | ICD-10-CM | POA: Diagnosis present

## 2018-09-13 DIAGNOSIS — I10 Essential (primary) hypertension: Secondary | ICD-10-CM | POA: Diagnosis not present

## 2018-09-13 DIAGNOSIS — E669 Obesity, unspecified: Secondary | ICD-10-CM | POA: Diagnosis present

## 2018-09-13 DIAGNOSIS — Z6841 Body Mass Index (BMI) 40.0 and over, adult: Secondary | ICD-10-CM | POA: Diagnosis not present

## 2018-09-13 DIAGNOSIS — Z7982 Long term (current) use of aspirin: Secondary | ICD-10-CM | POA: Diagnosis not present

## 2018-09-13 DIAGNOSIS — G4733 Obstructive sleep apnea (adult) (pediatric): Secondary | ICD-10-CM | POA: Diagnosis present

## 2018-09-13 DIAGNOSIS — E785 Hyperlipidemia, unspecified: Secondary | ICD-10-CM | POA: Diagnosis not present

## 2018-09-13 DIAGNOSIS — I214 Non-ST elevation (NSTEMI) myocardial infarction: Secondary | ICD-10-CM

## 2018-09-13 DIAGNOSIS — Z9861 Coronary angioplasty status: Secondary | ICD-10-CM

## 2018-09-13 DIAGNOSIS — E781 Pure hyperglyceridemia: Secondary | ICD-10-CM | POA: Diagnosis present

## 2018-09-13 DIAGNOSIS — Z825 Family history of asthma and other chronic lower respiratory diseases: Secondary | ICD-10-CM | POA: Diagnosis not present

## 2018-09-13 DIAGNOSIS — I252 Old myocardial infarction: Secondary | ICD-10-CM

## 2018-09-13 DIAGNOSIS — R072 Precordial pain: Secondary | ICD-10-CM | POA: Diagnosis present

## 2018-09-13 DIAGNOSIS — Z833 Family history of diabetes mellitus: Secondary | ICD-10-CM | POA: Diagnosis not present

## 2018-09-13 HISTORY — DX: Atherosclerotic heart disease of native coronary artery without angina pectoris: I25.10

## 2018-09-13 HISTORY — DX: Atherosclerotic heart disease of native coronary artery without angina pectoris: Z98.61

## 2018-09-13 HISTORY — DX: Non-ST elevation (NSTEMI) myocardial infarction: I21.4

## 2018-09-13 HISTORY — PX: LEFT HEART CATH AND CORONARY ANGIOGRAPHY: CATH118249

## 2018-09-13 HISTORY — PX: CORONARY STENT INTERVENTION: CATH118234

## 2018-09-13 LAB — I-STAT TROPONIN, ED
TROPONIN I, POC: 0.15 ng/mL — AB (ref 0.00–0.08)
Troponin i, poc: 0.13 ng/mL (ref 0.00–0.08)

## 2018-09-13 LAB — POCT ACTIVATED CLOTTING TIME
ACTIVATED CLOTTING TIME: 285 s
Activated Clotting Time: 301 seconds

## 2018-09-13 LAB — TROPONIN I: TROPONIN I: 0.2 ng/mL — AB (ref <0.03)

## 2018-09-13 LAB — HEPARIN LEVEL (UNFRACTIONATED): HEPARIN UNFRACTIONATED: 0.35 [IU]/mL (ref 0.30–0.70)

## 2018-09-13 SURGERY — LEFT HEART CATH AND CORONARY ANGIOGRAPHY
Anesthesia: LOCAL

## 2018-09-13 MED ORDER — SODIUM CHLORIDE 0.9% FLUSH: 3.0000 mL | INTRAVENOUS | Status: DC | PRN

## 2018-09-13 MED ORDER — LIDOCAINE HCL (PF) 1 % IJ SOLN
INTRAMUSCULAR | Status: DC | PRN
  Administered 2018-09-13: 3 mL

## 2018-09-13 MED ORDER — ASPIRIN 81 MG PO CHEW: 81.0000 mg | CHEWABLE_TABLET | ORAL | Status: DC

## 2018-09-13 MED ORDER — ONDANSETRON HCL 4 MG/2ML IJ SOLN: 4.0000 mg | Freq: Four times a day (QID) | INTRAMUSCULAR | Status: DC | PRN

## 2018-09-13 MED ORDER — AMITRIPTYLINE HCL 25 MG PO TABS: 25.0000 mg | ORAL_TABLET | Freq: Every evening | ORAL | Status: DC | PRN

## 2018-09-13 MED ORDER — IOHEXOL 350 MG/ML SOLN
INTRAVENOUS | Status: DC | PRN
  Administered 2018-09-13: 165 mL via INTRACARDIAC

## 2018-09-13 MED ORDER — ASPIRIN EC 81 MG PO TBEC: 81.0000 mg | DELAYED_RELEASE_TABLET | Freq: Every day | ORAL | Status: DC

## 2018-09-13 MED ORDER — SODIUM CHLORIDE 0.9 % IV SOLN
INTRAVENOUS | Status: AC | PRN
  Administered 2018-09-13: 100 mL/h via INTRAVENOUS

## 2018-09-13 MED ORDER — ACETAMINOPHEN 325 MG PO TABS: 650.0000 mg | ORAL_TABLET | ORAL | Status: DC | PRN

## 2018-09-13 MED ORDER — MIDAZOLAM HCL 2 MG/2ML IJ SOLN
INTRAMUSCULAR | Status: AC
  Filled 2018-09-13: qty 2

## 2018-09-13 MED ORDER — OXYCODONE HCL 5 MG PO TABS: 5.0000 mg | ORAL_TABLET | ORAL | Status: DC | PRN

## 2018-09-13 MED ORDER — SODIUM CHLORIDE 0.9 % IV SOLN: 250.0000 mL | INTRAVENOUS | Status: DC | PRN

## 2018-09-13 MED ORDER — MIDAZOLAM HCL 2 MG/2ML IJ SOLN
INTRAMUSCULAR | Status: DC | PRN
  Administered 2018-09-13: 1 mg via INTRAVENOUS
  Administered 2018-09-13: 0.5 mg via INTRAVENOUS
  Administered 2018-09-13 (×2): 1 mg via INTRAVENOUS

## 2018-09-13 MED ORDER — ASPIRIN 81 MG PO CHEW
81.0000 mg | CHEWABLE_TABLET | Freq: Every day | ORAL | Status: DC
  Administered 2018-09-13 – 2018-09-14 (×2): 81 mg via ORAL
  Filled 2018-09-13 (×2): qty 1

## 2018-09-13 MED ORDER — TICAGRELOR 90 MG PO TABS
ORAL_TABLET | ORAL | Status: DC | PRN
  Administered 2018-09-13: 180 mg via ORAL

## 2018-09-13 MED ORDER — SODIUM CHLORIDE 0.9 % IV SOLN
INTRAVENOUS | Status: AC
  Administered 2018-09-13: 19:00:00 via INTRAVENOUS

## 2018-09-13 MED ORDER — HEPARIN SODIUM (PORCINE) 5000 UNIT/ML IJ SOLN
5000.0000 [IU] | Freq: Three times a day (TID) | INTRAMUSCULAR | Status: DC
  Administered 2018-09-14: 06:00:00 5000 [IU] via SUBCUTANEOUS
  Filled 2018-09-13: qty 1

## 2018-09-13 MED ORDER — HYDRALAZINE HCL 20 MG/ML IJ SOLN: 5.0000 mg | INTRAMUSCULAR | Status: AC | PRN

## 2018-09-13 MED ORDER — SODIUM CHLORIDE 0.9% FLUSH: 3.0000 mL | Freq: Two times a day (BID) | INTRAVENOUS | Status: DC

## 2018-09-13 MED ORDER — HEPARIN SODIUM (PORCINE) 1000 UNIT/ML IJ SOLN
INTRAMUSCULAR | Status: AC
  Filled 2018-09-13: qty 1

## 2018-09-13 MED ORDER — FENTANYL CITRATE (PF) 100 MCG/2ML IJ SOLN
INTRAMUSCULAR | Status: DC | PRN
  Administered 2018-09-13: 50 ug via INTRAVENOUS
  Administered 2018-09-13 (×2): 25 ug via INTRAVENOUS

## 2018-09-13 MED ORDER — HEPARIN (PORCINE) IN NACL 1000-0.9 UT/500ML-% IV SOLN
INTRAVENOUS | Status: DC | PRN
  Administered 2018-09-13: 500 mL

## 2018-09-13 MED ORDER — LABETALOL HCL 5 MG/ML IV SOLN: 10.0000 mg | INTRAVENOUS | Status: AC | PRN

## 2018-09-13 MED ORDER — TICAGRELOR 90 MG PO TABS
90.0000 mg | ORAL_TABLET | Freq: Two times a day (BID) | ORAL | Status: DC
  Administered 2018-09-14: 90 mg via ORAL
  Filled 2018-09-13: qty 1

## 2018-09-13 MED ORDER — SIMVASTATIN 20 MG PO TABS: 40.0000 mg | ORAL_TABLET | Freq: Every day | ORAL | Status: DC

## 2018-09-13 MED ORDER — THE SENSUOUS HEART BOOK
Freq: Once | Status: AC
  Administered 2018-09-13: 1
  Filled 2018-09-13: qty 1

## 2018-09-13 MED ORDER — HEPARIN (PORCINE) IN NACL 1000-0.9 UT/500ML-% IV SOLN
INTRAVENOUS | Status: AC
  Filled 2018-09-13: qty 1000

## 2018-09-13 MED ORDER — ATORVASTATIN CALCIUM 80 MG PO TABS: 80.0000 mg | ORAL_TABLET | Freq: Every day | ORAL | Status: DC

## 2018-09-13 MED ORDER — LIDOCAINE HCL (PF) 1 % IJ SOLN
INTRAMUSCULAR | Status: AC
  Filled 2018-09-13: qty 30

## 2018-09-13 MED ORDER — TICAGRELOR 90 MG PO TABS
ORAL_TABLET | ORAL | Status: AC
  Filled 2018-09-13: qty 2

## 2018-09-13 MED ORDER — SODIUM CHLORIDE 0.9 % WEIGHT BASED INFUSION: 3.0000 mL/kg/h | INTRAVENOUS | Status: DC

## 2018-09-13 MED ORDER — HEART ATTACK BOUNCING BOOK
Freq: Once | Status: AC
  Administered 2018-09-13: 23:00:00 1
  Filled 2018-09-13: qty 1

## 2018-09-13 MED ORDER — NITROGLYCERIN 1 MG/10 ML FOR IR/CATH LAB
INTRA_ARTERIAL | Status: DC | PRN
  Administered 2018-09-13 (×2): 200 ug via INTRACORONARY

## 2018-09-13 MED ORDER — FENTANYL CITRATE (PF) 100 MCG/2ML IJ SOLN
INTRAMUSCULAR | Status: AC
  Filled 2018-09-13: qty 2

## 2018-09-13 MED ORDER — METOPROLOL SUCCINATE ER 25 MG PO TB24
25.0000 mg | ORAL_TABLET | Freq: Every day | ORAL | Status: DC
  Administered 2018-09-13: 25 mg via ORAL
  Filled 2018-09-13: qty 1

## 2018-09-13 MED ORDER — HEPARIN BOLUS VIA INFUSION
4000.0000 [IU] | Freq: Once | INTRAVENOUS | Status: AC
  Administered 2018-09-13: 4000 [IU] via INTRAVENOUS
  Filled 2018-09-13: qty 4000

## 2018-09-13 MED ORDER — ANGIOPLASTY BOOK
Freq: Once | Status: DC
  Filled 2018-09-13: qty 1

## 2018-09-13 MED ORDER — NITROGLYCERIN 0.4 MG SL SUBL: 0.4000 mg | SUBLINGUAL_TABLET | SUBLINGUAL | Status: DC | PRN

## 2018-09-13 MED ORDER — VERAPAMIL HCL 2.5 MG/ML IV SOLN
INTRAVENOUS | Status: AC
  Filled 2018-09-13: qty 2

## 2018-09-13 MED ORDER — SODIUM CHLORIDE 0.9 % WEIGHT BASED INFUSION: 1.0000 mL/kg/h | INTRAVENOUS | Status: DC

## 2018-09-13 MED ORDER — ASPIRIN 81 MG PO CHEW
324.0000 mg | CHEWABLE_TABLET | Freq: Once | ORAL | Status: DC
  Filled 2018-09-13: qty 4

## 2018-09-13 MED ORDER — HEPARIN SODIUM (PORCINE) 1000 UNIT/ML IJ SOLN
INTRAMUSCULAR | Status: DC | PRN
  Administered 2018-09-13: 2000 [IU] via INTRAVENOUS
  Administered 2018-09-13: 6000 [IU] via INTRAVENOUS
  Administered 2018-09-13: 7000 [IU] via INTRAVENOUS
  Administered 2018-09-13: 2000 [IU] via INTRAVENOUS

## 2018-09-13 MED ORDER — VERAPAMIL HCL 2.5 MG/ML IV SOLN
INTRAVENOUS | Status: DC | PRN
  Administered 2018-09-13: 10 mL via INTRA_ARTERIAL

## 2018-09-13 MED ORDER — HEPARIN (PORCINE) 25000 UT/250ML-% IV SOLN
1400.0000 [IU]/h | INTRAVENOUS | Status: DC
  Administered 2018-09-13: 1400 [IU]/h via INTRAVENOUS
  Filled 2018-09-13: qty 250

## 2018-09-13 MED ORDER — METOPROLOL SUCCINATE ER 50 MG PO TB24
50.0000 mg | ORAL_TABLET | Freq: Every day | ORAL | Status: DC
  Administered 2018-09-14: 10:00:00 50 mg via ORAL
  Filled 2018-09-13: qty 1

## 2018-09-13 SURGICAL SUPPLY — 17 items
BALLN SAPPHIRE 2.5X12 (BALLOONS) ×2
BALLN SAPPHIRE ~~LOC~~ 3.75X12 (BALLOONS) ×1 IMPLANT
BALLOON SAPPHIRE 2.5X12 (BALLOONS) IMPLANT
CATH 5FR JL3.5 JR4 ANG PIG MP (CATHETERS) ×1 IMPLANT
CATH VISTA GUIDE 6FR XBLAD3.5 (CATHETERS) ×1 IMPLANT
DEVICE RAD COMP TR BAND LRG (VASCULAR PRODUCTS) ×1 IMPLANT
GLIDESHEATH SLEND A-KIT 6F 22G (SHEATH) ×1 IMPLANT
GUIDEWIRE INQWIRE 1.5J.035X260 (WIRE) IMPLANT
INQWIRE 1.5J .035X260CM (WIRE) ×2
KIT ENCORE 26 ADVANTAGE (KITS) ×1 IMPLANT
KIT HEART LEFT (KITS) ×2 IMPLANT
PACK CARDIAC CATHETERIZATION (CUSTOM PROCEDURE TRAY) ×2 IMPLANT
SHEATH PROBE COVER 6X72 (BAG) ×1 IMPLANT
STENT SYNERGY DES 3.5X16 (Permanent Stent) ×1 IMPLANT
TRANSDUCER W/STOPCOCK (MISCELLANEOUS) ×2 IMPLANT
TUBING CIL FLEX 10 FLL-RA (TUBING) ×2 IMPLANT
WIRE MINAMO 190 (WIRE) ×1 IMPLANT

## 2018-09-13 NOTE — Progress Notes (Signed)
Received report from ED RN about patient coming to [id number]. Lajoyce Corners, RN

## 2018-09-13 NOTE — CV Procedure (Signed)
   Coronary angiography and PCI via right radial approach using vascular ultrasound guidance for access.  High-grade proximal LAD stenosis treated with Synergy DES and postdilated to 3.75 mm in diameter reducing the 95% stenosis to 0% with TIMI grade III flow.  Widely patent coronary arteries otherwise.  Normal LV function.  No immediate complications.  Potential discharge in a.m. with no complications.

## 2018-09-13 NOTE — Progress Notes (Signed)
ANTICOAGULATION CONSULT NOTE - follow-up Consult  Pharmacy Consult for heparin Indication: chest pain/ACS  Allergies  Allergen Reactions  . Penicillins Other (See Comments) and Hives    ALL OF THE ABOVE     Patient Measurements: Height: 5\' 9"  (175.3 cm) Weight: 280 lb 6.8 oz (127.2 kg) IBW/kg (Calculated) : 70.7 Heparin Dosing Weight: 100kg   Vital Signs: Temp: 98.7 F (37.1 C) (11/27 0610) Temp Source: Oral (11/27 0610) BP: 137/75 (11/27 0827) Pulse Rate: 95 (11/27 0827)  Labs: Recent Labs    09/12/18 2145 09/13/18 0320 09/13/18 1243  HGB 14.2  --   --   HCT 41.8  --   --   PLT 148*  --   --   HEPARINUNFRC  --   --  0.35  CREATININE 1.18  --   --   TROPONINI  --  0.20*  --     Estimated Creatinine Clearance: 81.3 mL/min (by C-G formula based on SCr of 1.18 mg/dL).   Medical History: Past Medical History:  Diagnosis Date  . CAD (coronary artery disease)    Echo, EF =>55%, normal findings  . Chest pain 07/28/2016  . Chest pain, atypical 2012 & 2009   stress myo, EF 65% 2012/ stress myo, EF 65% 2009  . Dizziness 2012   normal findings..  . Essential hypertension 07/28/2016  . Lower extremity edema 07/28/2016  . Obesity   . Obesity (BMI 30-39.9) 07/28/2016  . OSA (obstructive sleep apnea) 07/28/2016  . Restless leg syndrome    mild  . Shortness of breath 07/28/2016    Medications:  See medication history  Assessment: 67 yo man on heparin for ACS, elevated troponin. He was not on anticoagulation PTA. Scheduled for CATH 11/27.  Heparin level therapeutic at 0.35. CBC WNL, no signs/symptoms of bleeding noted.   Goal of Therapy:  Heparin level 0.3-0.7 units/ml Monitor platelets by anticoagulation protocol: Yes   Plan:  Continue heparin at 1400 units/hr Follow up anticoagulation post cath  Daily HL and CBC while on heparin Monitor for bleeding complications  Thanks for allowing pharmacy to be a part of this patient's care.  Azzie Roup  D PGY1 Pharmacy Resident  Phone [phone] Please use [facility] for clinical pharmacists numbers  09/13/2018      1:41 PM

## 2018-09-13 NOTE — Progress Notes (Signed)
TR  BAND REMOVAL  LOCATION:    right radial  DEFLATED PER PROTOCOL:    Yes.    TIME BAND OFF / DRESSING APPLIED:    22:00   SITE UPON ARRIVAL:    Level 0  SITE AFTER BAND REMOVAL:    Level 0  CIRCULATION SENSATION AND MOVEMENT:    Within Normal Limits   Yes.    COMMENTS:   Post TR band instructions given. Pt tolerated well. 

## 2018-09-13 NOTE — Progress Notes (Signed)
Patient arrived to [id number] from ED. Patient assessment and CHG completed. Telemetry applied. Patient oriented to unit. VSS. Questions answered. Bed placed in lowest position and call bell within reach. Will continue to monitor. Lajoyce Corners, RN

## 2018-09-13 NOTE — Interval H&P Note (Signed)
Cath Lab Visit (complete for each Cath Lab visit)  Clinical Evaluation Leading to the Procedure:   ACS: Yes.    Non-ACS:    Anginal Classification: CCS III  Anti-ischemic medical therapy: Minimal Therapy (1 class of medications)  Non-Invasive Test Results: No non-invasive testing performed  Prior CABG: No previous CABG      History and Physical Interval Note:  09/13/2018 4:35 PM  Devon Mathews  has presented today for surgery, with the diagnosis of ns  The various methods of treatment have been discussed with the patient and family. After consideration of risks, benefits and other options for treatment, the patient has consented to  Procedure(s): LEFT HEART CATH AND CORONARY ANGIOGRAPHY (N/A) as a surgical intervention .  The patient's history has been reviewed, patient examined, no change in status, stable for surgery.  I have reviewed the patient's chart and labs.  Questions were answered to the patient's satisfaction.     Belva Crome III

## 2018-09-13 NOTE — Progress Notes (Signed)
ANTICOAGULATION CONSULT NOTE - Initial Consult  Pharmacy Consult for heparin Indication: chest pain/ACS  Allergies  Allergen Reactions  . Penicillins Other (See Comments) and Hives    ALL OF THE ABOVE     Patient Measurements: Weight: 286 lb (129.7 kg)(per history) Heparin Dosing Weight:   Vital Signs: BP: 140/78 (11/27 0400) Pulse Rate: 66 (11/27 0400)  Labs: Recent Labs    09/12/18 2145  HGB 14.2  HCT 41.8  PLT 148*  CREATININE 1.18    Estimated Creatinine Clearance: 83.4 mL/min (by C-G formula based on SCr of 1.18 mg/dL).   Medical History: Past Medical History:  Diagnosis Date  . CAD (coronary artery disease)    Echo, EF =>55%, normal findings  . Chest pain 07/28/2016  . Chest pain, atypical 2012 & 2009   stress myo, EF 65% 2012/ stress myo, EF 65% 2009  . Dizziness 2012   normal findings..  . Essential hypertension 07/28/2016  . Lower extremity edema 07/28/2016  . Obesity   . Obesity (BMI 30-39.9) 07/28/2016  . OSA (obstructive sleep apnea) 07/28/2016  . Restless leg syndrome    mild  . Shortness of breath 07/28/2016    Medications:  See medication history  Assessment: 67 yo man to start heparin for CP, elevated troponin.  He was not on anticoagulation PTA Goal of Therapy:  Heparin level 0.3-0.7 units/ml Monitor platelets by anticoagulation protocol: Yes   Plan:  Heparin bolus 4000 units and drip at 1400 units/hr Check heparin level 6-8 hours after start Daily HL and CBC while on heparin Monitor for bleeding complications  Thanks for allowing pharmacy to be a part of this patient's care.  Excell Seltzer, PharmD Clinical Pharmacist 09/13/2018,4:44 AM

## 2018-09-13 NOTE — H&P (Addendum)
Cardiology Admission History and Physical:   Patient ID: Devon Mathews; MRN: [id number]; DOB: 1950/11/30   Admission date: 09/12/2018  Primary Care Provider: Haywood Pao, MD Primary Cardiologist: Skeet Latch   Chief Complaint:  Chest pain  History of Present Illness:   Devon Mathews is a 67 y.o. male with a history of non-obstructive coronary artery disease, mild AR, essential hypertension, dyslipidemia, obstructive sleep apnea and obesity who presents to the hospital with complaints of chest pain.  Patient was engaged in heavy exertion at around 7 PM yesterday and developed substernal chest discomfort.  He was in his garage and lifted a crankshaft (weighing approximately 50 pounds) out of an engine.  The chest pain occurred soon after.  It did not radiate.  He does not complain of any nausea, vomiting or diaphoresis.  He does admit to some stuttering chest pain over the past 7 days.  He denies any orthopnea, paroxysmal nocturnal dyspnea or lower extremity edema.  In total the chest pain lasted for at least an hour last night and was only relieved once the EMS gave him 4 aspirins.  In the past he has had a cardiac catheterization that revealed only mild disease in the LAD.  He also had a myocardial perfusion scan 2 years ago that revealed an ejection fraction of 63% and no ischemia.  His echocardiogram from 08/16/2016 revealed an EF of 60%, mild LVH, mild AR and mild TR.  In the emergency department his ECG revealed sinus rhythm with no ischemic changes.  His troponin I was 0.15.     Past Medical History:  Diagnosis Date  . CAD (coronary artery disease)    Echo, EF =>55%, normal findings  . Chest pain 07/28/2016  . Chest pain, atypical 2012 & 2009   stress myo, EF 65% 2012/ stress myo, EF 65% 2009  . Dizziness 2012   normal findings..  . Essential hypertension 07/28/2016  . Lower extremity edema 07/28/2016  . Obesity   . Obesity (BMI 30-39.9) 07/28/2016  . OSA (obstructive  sleep apnea) 07/28/2016  . Restless leg syndrome    mild  . Shortness of breath 07/28/2016    Past Surgical History:  Procedure Laterality Date  . CARDIAC CATHETERIZATION  2003   30-40% proximal segmental stenosis in the LAD  . CARDIAC CATHETERIZATION  2006   EF >50% & no mitral regurgitation     Medications Prior to Admission: Prior to Admission medications   Medication Sig Start Date End Date Taking? Authorizing Provider  amitriptyline (ELAVIL) 25 MG tablet Take 25 mg by mouth at bedtime as needed for sleep.   Yes [provider]  aspirin EC 81 MG tablet Take 81 mg by mouth daily.   Yes [provider]  metoprolol succinate (TOPROL-XL) 25 MG 24 hr tablet Take 1 tablet (25 mg total) by mouth daily. 01/21/14  Yes [name], MD  pantoprazole (PROTONIX) 40 MG tablet TAKE ONE TABLET BY MOUTH ONE TIME DAILY. must make follow up appointment for further refills Patient taking differently: Take 40 mg by mouth daily.  10/07/14  Yes [name], MD  simvastatin (ZOCOR) 40 MG tablet Take 1 tablet (40 mg total) by mouth daily. 11/02/17  Yes Skeet Latch, MD     Allergies:    Allergies  Allergen Reactions  . Penicillins Other (See Comments) and Hives    ALL OF THE ABOVE     Social History:   Social History   Socioeconomic History  .  Marital status: Married    Spouse name: Not on file  . Number of children: Not on file  . Years of education: Not on file  . Highest education level: Not on file  Occupational History  . Not on file  Social Needs  . Financial resource strain: Not on file  . Food insecurity:    Worry: Not on file    Inability: Not on file  . Transportation needs:    Medical: Not on file    Non-medical: Not on file  Tobacco Use  . Smoking status: Never Smoker  . Smokeless tobacco: Never Used  Substance and Sexual Activity  . Alcohol use: No  . Drug use: No  . Sexual activity: Not on file  Lifestyle  . Physical activity:    Days  per week: Not on file    Minutes per session: Not on file  . Stress: Not on file  Relationships  . Social connections:    Talks on phone: Not on file    Gets together: Not on file    Attends religious service: Not on file    Active member of club or organization: Not on file    Attends meetings of clubs or organizations: Not on file    Relationship status: Not on file  . Intimate partner violence:    Fear of current or ex partner: Not on file    Emotionally abused: Not on file    Physically abused: Not on file    Forced sexual activity: Not on file  Other Topics Concern  . Not on file  Social History Narrative  . Not on file     Family History:   The patient's family history includes COPD in his father; Diabetes in his brother and sister; Heart attack in his paternal grandmother and sister; Heart disease in his father; Heart failure in his father, mother, and sister; Other in his father and maternal grandfather.     Review of Systems: [y] = yes, [ ]  = no   . General: Weight gain [ ] ; Weight loss [ ] ; Anorexia [ ] ; Fatigue [ ] ; Fever [ ] ; Chills [ ] ; Weakness [ ]   . Cardiac: Chest pain/pressure Blue.Reese ]; Resting SOB [ ] ; Exertional SOB [ ] ; Orthopnea [ ] ; Pedal Edema [ ] ; Palpitations [ ] ; Syncope [ ] ; Presyncope [ ] ; Paroxysmal nocturnal dyspnea[ ]   . Pulmonary: Cough [ ] ; Wheezing[ ] ; Hemoptysis[ ] ; Sputum [ ] ; Snoring [ ]   . GI: Vomiting[ ] ; Dysphagia[ ] ; Melena[ ] ; Hematochezia [ ] ; Heartburn[ ] ; Abdominal pain [ ] ; Constipation [ ] ; Diarrhea [ ] ; BRBPR [ ]   . GU: Hematuria[ ] ; Dysuria [ ] ; Nocturia[ ]   . Vascular: Pain in legs with walking [ ] ; Pain in feet with lying flat [ ] ; Non-healing sores [ ] ; Stroke [ ] ; TIA [ ] ; Slurred speech [ ] ;  . Neuro: Headaches[ ] ; Vertigo[ ] ; Seizures[ ] ; Paresthesias[ ] ;Blurred vision [ ] ; Diplopia [ ] ; Vision changes [ ]   . Ortho/Skin: Arthritis [ ] ; Joint pain [ ] ; Muscle pain [ ] ; Joint swelling [ ] ; Back Pain [ ] ; Rash [ ]   . Psych:  Depression[ ] ; Anxiety[ ]   . Heme: Bleeding problems [ ] ; Clotting disorders [ ] ; Anemia [ ]   . Endocrine: Diabetes [ ] ; Thyroid dysfunction[ ]      Physical Exam/Data:   Vitals:   09/13/18 0245 09/13/18 0315 09/13/18 0330 09/13/18 0400  BP: 136/69 139/77 (!) 128/98 140/78  Pulse: 71  68 66 66  Resp: 18 18 (!) 21 (!) 21  SpO2: 93% 94% 93% 92%  Weight:    129.7 kg   No intake or output data in the 24 hours ending 09/13/18 0519 Filed Weights   09/13/18 0400  Weight: 129.7 kg   Body mass index is 41.04 kg/m.  General:  Well nourished, well developed, in no acute distress HEENT: normal Lymph: no adenopathy Neck: no JVD Endocrine:  No thryomegaly Vascular: No carotid bruits; FA pulses 2+ bilaterally without bruits  Cardiac:  normal S1, S2; RRR; no murmur  Lungs:  clear to auscultation bilaterally, no wheezing, rhonchi or rales  Abd: soft, nontender, no hepatomegaly  Ext: no edema Musculoskeletal:  No deformities, BUE and BLE strength normal and equal Skin: warm and dry  Neuro:  CNs 2-12 intact, no focal abnormalities noted Psych:  Normal affect    EKG:  The ECG that was done 09/12/18 was personally reviewed and demonstrates normal sinus rhythm and no ischemic changes.    Laboratory Data:  Chemistry Recent Labs  Lab 09/12/18 2145  NA 138  K 4.7  CL 106  CO2 25  GLUCOSE 137*  BUN 13  CREATININE 1.18  CALCIUM 9.5  GFRNONAA >60  GFRAA >60  ANIONGAP 7    No results for input(s): PROT, ALBUMIN, AST, ALT, ALKPHOS, BILITOT in the last 168 hours. Hematology Recent Labs  Lab 09/12/18 2145  WBC 5.3  RBC 4.09*  HGB 14.2  HCT 41.8  MCV 102.2*  MCH 34.7*  MCHC 34.0  RDW 12.5  PLT 148*   Cardiac Enzymes Recent Labs  Lab 09/13/18 0320  TROPONINI 0.20*    Recent Labs  Lab 09/13/18 0056 09/13/18 0404  TROPIPOC 0.13* 0.15*    BNPNo results for input(s): BNP, PROBNP in the last 168 hours.  DDimer No results for input(s): DDIMER in the last 168  hours.  Radiology/Studies:  Dg Chest 2 View  Result Date: 09/12/2018 CLINICAL DATA:  Chest pain EXAM: CHEST - 2 VIEW COMPARISON:  06/23/2008 FINDINGS: No acute opacity or pleural effusion. Mild cardiomegaly. Degenerative osteophytes of the spine. IMPRESSION: No active cardiopulmonary disease.  Mild cardiomegaly Electronically Signed   By: Donavan Foil M.D.   On: 09/12/2018 23:01    Assessment and Plan:   1. Non-ST elevation myocardial infarction  - Trend cardiac biomarkers and obtain serial ECGs - Continue aspirin 81 mg daily - Continue Metoprolol - Switch to high dose statins (such as Atorvastatin 80 mg nightly). Check a lipid panel in the morning. - Start intravenous unfractionated heparin per pharmacy protocol - Obtain a transthoracic echocardiogram to evaluate LV systolic and diastolic function. - Recommend cardiac catheterization to define the coronary anatomy. - Please make the patient NPO (except medications)    Severity of Illness: The appropriate patient status for this patient is INPATIENT. Inpatient status is judged to be reasonable and necessary in order to provide the required intensity of service to ensure the patient's safety. The patient's presenting symptoms, physical exam findings, and initial radiographic and laboratory data in the context of their chronic comorbidities is felt to place them at high risk for further clinical deterioration. Furthermore, it is not anticipated that the patient will be medically stable for discharge from the hospital within 2 midnights of admission. The following factors support the patient status of inpatient.   " The patient's presenting symptoms include chest pain. " The worrisome physical exam findings include normal cardiac auscultation. " The initial radiographic and laboratory data  are worrisome because of elevated troponin. " The chronic co-morbidities include hypertension.   * I certify that at the point of admission it is my  clinical judgment that the patient will require inpatient hospital care spanning beyond 2 midnights from the point of admission due to high intensity of service, high risk for further deterioration and high frequency of surveillance required.*    For questions or updates, please contact [place] Please consult [email or web address] for contact info under Cardiology/STEMI.    Signed, Meade Maw, MD  09/13/2018 5:19 AM   I have examined the patient and reviewed assessment and plan and discussed with patient.  Agree with above as stated.  Troponin elevated.  Cath many years ago when no PCI was needed. On IV heparin.  Will plan for cath later today.  Discussed radial approach with the patient.  He had cath from the femoral appproach the last time.  I personally reviewed the ECG.  NSR. No ST changes.  No indication for emergency cath at this time.  All questions about the procedure explained to the patient and family.   Larae Grooms

## 2018-09-13 NOTE — ED Provider Notes (Signed)
  Physical Exam  BP 140/78   Pulse 66   Resp (!) 21   SpO2 92%   Physical Exam  ED Course/Procedures     Procedures  MDM    12:30:  Assumed care of this patient from Dr. Ashok Cordia. Patient had come in with nonspecific precordial chest pain.  He is now chest pain-free.  Patient has known history of nonobstructive coronary artery disease.  Plan is to get delta troponin, which if negative patient can go home.   2:00 am Patient's troponin is noted to be elevated.  We will get a repeat troponin soon.  3:00 am She had the results with the patient.  He is currently chest pain-free, however he states that at one point he went to get up and go to the bathroom and had a small spell of chest pain.  Upon further questioning the patient it appears that he had chest pain after he had done working on his car.  Chest pain lasted for few minutes but was severe and generalized in the midsternal region.  Pain was nonradiating and there was no associated nausea or diaphoresis.  Patient does indicate that he has had few episodes of chest pain over the past 2 or 3 days that came upon spontaneously and lasted just a few seconds.  There is no history of PE, DVT and patient does not have any risk factors for the same. Plan is to admit.  We will give him heparin if the repeat troponin is still elevated.  CRITICAL CARE Performed by: Jerome Viglione   Total critical care time: 32 minutes  Critical care time was exclusive of separately billable procedures and treating other patients.  Critical care was necessary to treat or prevent imminent or life-threatening deterioration.  Critical care was time spent personally by me on the following activities: development of treatment plan with patient and/or surrogate as well as nursing, discussions with consultants, evaluation of patient's response to treatment, examination of patient, obtaining history from patient or surrogate, ordering and performing treatments  and interventions, ordering and review of laboratory studies, ordering and review of radiographic studies, pulse oximetry and re-evaluation of patient's condition.   4:39 AM Second troponin is slightly higher at 0.15.  We will order heparin and consult cardiology for admission. Results from the ER workup discussed with the patient face to face and all questions answered to the best of my ability.      Varney Biles, MD 09/13/18 [phone]

## 2018-09-14 ENCOUNTER — Other Ambulatory Visit: Payer: Self-pay | Admitting: Physician Assistant

## 2018-09-14 DIAGNOSIS — I10 Essential (primary) hypertension: Secondary | ICD-10-CM

## 2018-09-14 DIAGNOSIS — E785 Hyperlipidemia, unspecified: Secondary | ICD-10-CM

## 2018-09-14 LAB — LIPID PANEL
CHOL/HDL RATIO: 3.4 ratio
CHOLESTEROL: 132 mg/dL (ref 0–200)
HDL: 39 mg/dL — AB (ref >40)
LDL Cholesterol: 69 mg/dL (ref 0–99)
Triglycerides: 118 mg/dL (ref <150)
VLDL: 24 mg/dL (ref 0–40)

## 2018-09-14 LAB — CBC
HEMATOCRIT: 41.1 % (ref 39.0–52.0)
HEMOGLOBIN: 14.1 g/dL (ref 13.0–17.0)
MCH: 34.5 pg — AB (ref 26.0–34.0)
MCHC: 34.3 g/dL (ref 30.0–36.0)
MCV: 100.5 fL — AB (ref 80.0–100.0)
Platelets: 155 10*3/uL (ref 150–400)
RBC: 4.09 MIL/uL — ABNORMAL LOW (ref 4.22–5.81)
RDW: 12.6 % (ref 11.5–15.5)
WBC: 6 10*3/uL (ref 4.0–10.5)
nRBC: 0 % (ref 0.0–0.2)

## 2018-09-14 LAB — BASIC METABOLIC PANEL
Anion gap: 8 (ref 5–15)
BUN: 14 mg/dL (ref 8–23)
CALCIUM: 9.2 mg/dL (ref 8.9–10.3)
CO2: 20 mmol/L — ABNORMAL LOW (ref 22–32)
CREATININE: 0.92 mg/dL (ref 0.61–1.24)
Chloride: 110 mmol/L (ref 98–111)
GFR calc Af Amer: >60 mL/min (ref >60)
GFR calc non Af Amer: >60 mL/min (ref >60)
GLUCOSE: 130 mg/dL — AB (ref 70–99)
Potassium: 4.2 mmol/L (ref 3.5–5.1)
SODIUM: 138 mmol/L (ref 135–145)

## 2018-09-14 LAB — HEMOGLOBIN A1C
HEMOGLOBIN A1C: 5.2 % (ref 4.8–5.6)
Mean Plasma Glucose: 102.54 mg/dL

## 2018-09-14 LAB — HIV ANTIBODY (ROUTINE TESTING W REFLEX): HIV SCREEN 4TH GENERATION: NONREACTIVE

## 2018-09-14 MED ORDER — NITROGLYCERIN 0.4 MG SL SUBL: 0.4000 mg | SUBLINGUAL_TABLET | SUBLINGUAL | 11 refills | Status: DC | PRN

## 2018-09-14 MED ORDER — ATORVASTATIN CALCIUM 80 MG PO TABS: 80.0000 mg | ORAL_TABLET | Freq: Every day | ORAL | 11 refills | Status: DC

## 2018-09-14 MED ORDER — HYDROCHLOROTHIAZIDE 12.5 MG PO CAPS: 12.5000 mg | ORAL_CAPSULE | Freq: Every day | ORAL | 11 refills | Status: DC

## 2018-09-14 MED ORDER — TICAGRELOR 90 MG PO TABS: 90.0000 mg | ORAL_TABLET | Freq: Two times a day (BID) | ORAL | 11 refills | Status: DC

## 2018-09-14 MED ORDER — HYDROCHLOROTHIAZIDE 12.5 MG PO CAPS
12.5000 mg | ORAL_CAPSULE | Freq: Every day | ORAL | Status: DC
  Administered 2018-09-14: 12.5 mg via ORAL
  Filled 2018-09-14: qty 1

## 2018-09-14 MED ORDER — TICAGRELOR 90 MG PO TABS: 90.0000 mg | ORAL_TABLET | Freq: Two times a day (BID) | ORAL | 0 refills | Status: DC

## 2018-09-14 MED ORDER — METOPROLOL SUCCINATE ER 50 MG PO TB24: 50.0000 mg | ORAL_TABLET | Freq: Every day | ORAL | 11 refills | Status: DC

## 2018-09-14 NOTE — Care Management Note (Signed)
Case Management Note  Patient Details  Name: Devon Mathews MRN: [id number] Date of Birth: August 27, 1951  Subjective/Objective: Pt presented for Nstemi- Plan to transition home on [place]. Patient has 30 day free card. Patient will have to get cost from Pharmacy.                    Action/Plan: No additional home needs identified.   Expected Discharge Date:  09/14/18               Expected Discharge Plan:  Home/Self Care  In-House Referral:  NA  Discharge planning Services  CM Consult, Medication Assistance  Post Acute Care Choice:  NA Choice offered to:  NA  DME Arranged:  N/A DME Agency:  NA  HH Arranged:  NA HH Agency:  NA  Status of Service:  Completed, signed off  If discussed at [place] of Stay Meetings, dates discussed:    Additional Comments:  Bethena Roys, RN 09/14/2018, 9:52 AM

## 2018-09-14 NOTE — Discharge Summary (Signed)
Discharge Summary    Patient ID: Devon Mathews  MRN: [id number], DOB/AGE: [birth year] 67 y.o.  Admit Date: 09/12/2018 Discharge Date: 09/14/2018  Primary Care Provider: Haywood Pao, MD Primary Cardiologist: Dr. Oval [name], MD  Discharge Diagnoses    Principal Problem:   NSTEMI (non-ST elevated myocardial infarction) [facility]) Active Problems:   CAD (coronary artery disease)   Essential hypertension   OSA (obstructive sleep apnea)   Hyperlipidemia LDL goal <70   Allergies Allergies  Allergen Reactions  . Penicillins Other (See Comments) and Hives    ALL OF THE ABOVE      History of Present Illness     67 year old male with prior nonobstructive CAD, mild aortic regurgitation, hypertension, hyperlipidemia, obesity, and OSA on CPAP who was admitted to [facility] on 09/13/2017 with a non-STEMI.  Patient was previously followed by Dr. Rollene Fare and Dr. Einar Gip.  He last saw Dr. Einar Gip in 01/2015 and reported mild, chronic dyspnea.  He previously underwent cardiac cath in 2003 and 2006 that showed a 40% LAD lesion.  He underwent exercise treadmill stress testing in 2007 that was negative for ischemia and a nuclear stress test in 2011 and 2012 that were both negative.  Echocardiogram in 06/2013 revealed an EF of 55 to 60% and mild mitral regurgitation.  Patient was seen in the office by Dr. Oval [name] in 07/2016 and reported several months of chest pain with exertional shortness of breath.  He underwent exercise Myoview on 08/13/2016 that showed an EF of 63% with no ischemia.  Echocardiogram on 08/16/2016 showed an LVEF of 60 to 65%, mild LVH, mild aortic regurgitation, and mild tricuspid regurgitation.  Most recent LDL of 71, following increased dose of simvastatin.  Patient was last seen in the office on 08/21/2018 and was feeling well.  He noted rare episodes of sharp chest pain that occurred when sitting on the couch.  There were no exertional symptoms.  He did note shortness of  breath when walking up inclines.  He did report some leg pain.  Because of this, he underwent ABIs on 09/01/2018 that were normal bilaterally.  Hospital Course     Consultants: cardiac rehab, care manager  Patient presented to [facility] on 09/13/2018 with exertional chest pain that began around 7 PM on 11/26 following heavy exertion.  Chest pain was substernal and did not radiate.  There was no associated nausea, vomiting, or diaphoresis.  He did report to some stuttering chest pain over the preceding 7 days.  Upon EMS arrival he was given 4 baby aspirin.  Initial EKG showed sinus rhythm with no ischemic changes.  Troponin I was 0.15.  He was started on heparin drip.  He underwent cardiac catheterization on 09/13/2018 that showed a proximal LAD 95% stenosis and underwent successful PCI/DES.  The remainder of the LAD was without significant obstruction.  He was also noted to have 20% distal left main stenosis, circumflex was widely patent without significant obstruction, normal small ramus intermedius branch, normal codominant RCA.  Normal LV systolic function with an upper normal LVEDP 18 mmHg.  LVEF was 65%.  It was recommended the patient remain on dual antiplatelet therapy with aspirin and Brilinta for 1 to 3 months followed by transition to monotherapy using Brilinta alone by interventional cardiology.  We have ordered hemoglobin A1c and lipid panel to be added onto blood drawn on 11/28.  At time of discharge, these labs are pending and will need to be followed up upon in  his hospital follow-up.  The patient noted to have hypertriglyceridemia, Vascepa should be considered.  Post-cath renal function and potassium were stable.  Post-cath hemoglobin stable.  On day of discharge, patient was feeling well without any chest pain shortness of breath.  Patient was noted to have poorly controlled blood pressure and mild edema.  HCTZ 12.5 mg was added on day of discharge.  Recommendation to recheck  BMP in 1 week.  Consult was placed to care manager for patient to receive Brilinta card.  Patient was given 2 prescriptions for Brilinta one for the 30-day free card and a second for the remaining 12 months of his antiplatelet therapy.  The patient's right radial cath site has been examined is healing well without issues at this time. The patient has been seen by Dr. Oval [name] and felt to be stable for discharge today. All follow up appointments have been made. Discharge medications are listed below. Prescriptions have been reviewed with the patient and printed, as indicated.  _____________  Discharge Vitals Blood pressure (!) 147/75, pulse 68, temperature 98.3 F (36.8 C), temperature source Oral, resp. rate (!) 21, height '5\' 9"'  (1.753 m), weight 127 kg, SpO2 94 %.  Filed Weights   09/13/18 0400 09/13/18 0610 09/14/18 0716  Weight: 129.7 kg 127.2 kg 127 kg    Labs & Radiologic Studies    CBC Recent Labs    09/12/18 2145 09/14/18 0230  WBC 5.3 6.0  HGB 14.2 14.1  HCT 41.8 41.1  MCV 102.2* 100.5*  PLT 148* 130   Basic Metabolic Panel Recent Labs    09/12/18 2145 09/14/18 0230  NA 138 138  K 4.7 4.2  CL 106 110  CO2 25 20*  GLUCOSE 137* 130*  BUN 13 14  CREATININE 1.18 0.92  CALCIUM 9.5 9.2   Liver Function Tests No results for input(s): AST, ALT, ALKPHOS, BILITOT, PROT, ALBUMIN in the last 72 hours. No results for input(s): LIPASE, AMYLASE in the last 72 hours. Cardiac Enzymes Recent Labs    09/13/18 0320  TROPONINI 0.20*   BNP Invalid input(s): POCBNP D-Dimer No results for input(s): DDIMER in the last 72 hours. Hemoglobin A1C No results for input(s): HGBA1C in the last 72 hours. Fasting Lipid Panel No results for input(s): CHOL, HDL, LDLCALC, TRIG, CHOLHDL, LDLDIRECT in the last 72 hours. Thyroid Function Tests No results for input(s): TSH, T4TOTAL, T3FREE, THYROIDAB in the last 72 hours.  Invalid input(s): FREET3 _____________  Dg Chest 2  View  Result Date: 09/12/2018 CLINICAL DATA:  Chest pain EXAM: CHEST - 2 VIEW COMPARISON:  06/23/2008 FINDINGS: No acute opacity or pleural effusion. Mild cardiomegaly. Degenerative osteophytes of the spine. IMPRESSION: No active cardiopulmonary disease.  Mild cardiomegaly Electronically Signed   By: Donavan Foil M.D.   On: 09/12/2018 23:01   Vas [place] Le Art Seg Multi (segm&le Reynauds)  Result Date: 09/01/2018 LOWER EXTREMITY DOPPLER STUDY Indications: Patient reports that his legs have started to feel tight and              painful, both at rest and when walking. High Risk Factors: Hypertension, no history of smoking, coronary artery                    disease.  Performing Technologist: Chesley Noon RVT  Examination Guidelines: A complete evaluation includes at minimum, Doppler waveform signals and systolic blood pressure reading at the level of bilateral brachial, anterior tibial, and posterior tibial arteries, when vessel segments are accessible. Bilateral  testing is considered an integral part of a complete examination. Photoelectric Plethysmograph (PPG) waveforms and toe systolic pressure readings are included as required and additional duplex testing as needed. Limited examinations for reoccurring indications may be performed as noted.  ABI Findings: +---------+------------------+-----+---------+--------+ Right    Rt Pressure (mmHg)IndexWaveform Comment  +---------+------------------+-----+---------+--------+ Brachial 145                                      +---------+------------------+-----+---------+--------+ CFA                             triphasic         +---------+------------------+-----+---------+--------+ Popliteal                       triphasic         +---------+------------------+-----+---------+--------+ ATA      148               1.02 triphasic         +---------+------------------+-----+---------+--------+ PTA      172               1.19 triphasic          +---------+------------------+-----+---------+--------+ PERO     161               1.11 triphasic         +---------+------------------+-----+---------+--------+ Doristine Devoid Toe209               1.44 Normal            +---------+------------------+-----+---------+--------+ +---------+------------------+-----+---------+-------+ Left     Lt Pressure (mmHg)IndexWaveform Comment +---------+------------------+-----+---------+-------+ Brachial 141                                     +---------+------------------+-----+---------+-------+ CFA                             triphasic        +---------+------------------+-----+---------+-------+ Popliteal                       triphasic        +---------+------------------+-----+---------+-------+ ATA      166               1.14 triphasic        +---------+------------------+-----+---------+-------+ PTA      179               1.23 triphasic        +---------+------------------+-----+---------+-------+ PERO     155               1.07 triphasic        +---------+------------------+-----+---------+-------+ Great Toe178               1.23 Normal           +---------+------------------+-----+---------+-------+  Summary: Right: Resting right ankle-brachial index is within normal range. No evidence of significant right lower extremity arterial disease. The right toe-brachial index is normal. Left: Resting left ankle-brachial index is within normal range. No evidence of significant left lower extremity arterial disease. The left toe-brachial index is normal.  *See table(s) above for measurements and observations.  Electronically signed by Ena Dawley MD on 09/01/2018 at  2:31:39 PM.    Final     Diagnostic Studies/Procedures   LHC 09/13/2018: Coronary Findings   Diagnostic  Dominance: Right  Left Anterior Descending  Prox LAD lesion 95% stenosed  Prox LAD lesion is 95% stenosed.  Intervention   Prox LAD  lesion  Stent  Lesion length: 12 mm. CATH VISTA GUIDE 6FR XBLAD3.5 guide catheter was inserted. Lesion crossed with guidewire using a WIRE [place]AMO 190. A drug-eluting stent was successfully placed using a STENT SYNERGY DES 3.5X16. Maximum pressure: 14 atm. Stent strut is well apposed. Stent does not overlap previously placed stentPost-stent angioplasty was performed using a BALLOON SAPPHIRE [place] 3.75X12.  Post-Intervention Lesion Assessment  The intervention was successful. Pre-interventional TIMI flow is 3. Post-intervention TIMI flow is 3. No complications occurred at this lesion.  There is a 0% residual stenosis post intervention.  Wall Motion   Resting               Left Heart   Left Ventricle The left ventricular size is normal. The left ventricular systolic function is normal. LV end diastolic pressure is normal. The left ventricular ejection fraction is greater than 65% by visual estimate.  Coronary Diagrams   Diagnostic Diagram       Post-Intervention Diagram        Conclusion    Non-ST elevation MI  High-grade, 95% proximal to mid RCA between the first diagonal and second septal perforator with TIMI grade III flow.  The remainder of the LAD is without significant obstruction.  Successful drug-eluting stent implantation reducing the 95% stenosis to 0% with TIMI grade III flow using a 16 x 3 5 Synergy postdilated to 3.75 mm in diameter.  20% distal left main.  Circumflex artery is widely patent without significant obstruction.  Normal small ramus intermedius branch.  Normal codominant RCA  Normal LV systolic function with upper normal LVEDP of 18 mmHg.  LVEF is 65%.  RECOMMENDATIONS:   Aggressive risk factor modification.  Have changed simvastatin to atorvastatin 80 mg/day.  Have increase Toprol-XL to 50 mg/day.  If triglycerides are elevated, consider Vascepa  Hemoglobin A1c should be obtained.  Dual antiplatelet therapy, aspirin and Brilinta for 1 to  3 months, then okay to drop to monotherapy using Brilinta only.  Classically, dual antiplatelet therapy should be continued for 12 months as listed below.  Practically, based on recent data, monotherapy without aspirin decreases risk of bleeding at no increased risk of ischemic events. Recommend uninterrupted dual antiplatelet therapy with Aspirin 72m daily and Ticagrelor 928mtwice daily for a minimum of 12 months (ACS - Class I recommendation).    _____________  Disposition   Pt is being discharged home today in good condition.  Follow-up Plans & Appointments    Follow-up Information    RaSkeet LatchMD Follow up.   Specialty:  Cardiology Why:  A message has been sent to our office to schedule hospital follow-up appointment Contact information: [address] [place] [phone]        Discharge Instructions    Diet - low sodium heart healthy   Complete by:  As directed    Increase activity slowly   Complete by:  As directed       Discharge Medications   Allergies as of 09/14/2018      Reactions   Penicillins Other (See Comments), Hives   ALL OF THE ABOVE       Medication List    STOP taking these  medications   simvastatin 40 MG tablet Commonly known as:  ZOCOR     TAKE these medications   amitriptyline 25 MG tablet Commonly known as:  ELAVIL Take 25 mg by mouth at bedtime as needed for sleep.   aspirin EC 81 MG tablet Take 81 mg by mouth daily.   atorvastatin 80 MG tablet Commonly known as:  LIPITOR Take 1 tablet (80 mg total) by mouth daily at 6 PM.   hydrochlorothiazide 12.5 MG capsule Commonly known as:  MICROZIDE Take 1 capsule (12.5 mg total) by mouth daily.   metoprolol succinate 50 MG 24 hr tablet Commonly known as:  TOPROL-XL Take 1 tablet (50 mg total) by mouth daily. Take with or immediately following a meal. What changed:    medication strength  how much to take  additional instructions    nitroGLYCERIN 0.4 MG SL tablet Commonly known as:  NITROSTAT Place 1 tablet (0.4 mg total) under the tongue every 5 (five) minutes x 3 doses as needed for chest pain.   pantoprazole 40 MG tablet Commonly known as:  PROTONIX TAKE ONE TABLET BY MOUTH ONE TIME DAILY. must make follow up appointment for further refills What changed:  See the new instructions.   ticagrelor 90 MG Tabs tablet Commonly known as:  BRILINTA Take 1 tablet (90 mg total) by mouth 2 (two) times daily.   ticagrelor 90 MG Tabs tablet Commonly known as:  BRILINTA Take 1 tablet (90 mg total) by mouth 2 (two) times daily.        Aspirin prescribed at discharge?  Yes High Intensity Statin Prescribed? (Lipitor 40-28m or Crestor 20-46m: Yes Beta Blocker Prescribed? Yes For EF <40%, was ACEI/ARB Prescribed? No: EF > 40% ADP Receptor Inhibitor Prescribed? (i.e. Plavix etc.-Includes Medically Managed Patients): Yes For EF <40%, Aldosterone Inhibitor Prescribed? No: EF > 40% Was EF assessed during THIS hospitalization? Yes Was Cardiac Rehab II ordered? (Included Medically managed Patients): Yes   Outstanding Labs/Studies   Patient's lipid panel and A1c were pending at time of discharge.  Message and orders have been sent/placed to our office for the patient to have a follow-up BMP in 1 week.  Duration of Discharge Encounter   Greater than 30 minutes including physician time.  Signed, RyRise MuPA-C [facility] Pager: ([phone]/28/2019, 9:30 AM

## 2018-09-14 NOTE — Discharge Instructions (Signed)
Please review your medication list carefully as there have been additions and/or changes.  It is very important that you take the baby aspirin and Brilinta as directed.  Should you have any difficulty in obtaining Brilinta please contact our office anytime of day or night for further instructions.  A message has been sent to our office to schedule your hospital follow-up.  You will need to recheck labs in 1 week.  Our office will contact you regarding this.    Coronary Artery Disease, Male Coronary artery disease (CAD) is a condition in which the arteries that lead to the heart (coronary arteries) become narrow or blocked. The narrowing or blockage can lead to decreased blood flow to the heart. Prolonged reduced blood flow can cause a heart attack (myocardial infarction or MI). This condition may also be called coronary heart disease. Because CAD is the leading cause of death in men, it is important to understand what causes this condition and how it is treated. What are the causes? CAD is most often caused by atherosclerosis. This is the buildup of fat and cholesterol (plaque) on the inside of the arteries. Over time, the plaque may narrow or block the artery, reducing blood flow to the heart. Plaque can also become weak and break off within a coronary artery and cause a sudden blockage. Other less common causes of CAD include:  An embolism or blood clot in a coronary artery.  A tearing of the artery (spontaneous coronary artery dissection).  An aneurysm.  Inflammation (vasculitis) in the artery wall.  What increases the risk? The following factors may make you more likely to develop this condition:  Age. Men over age 35 are at a greater risk of CAD.  Family history of CAD.  Gender. Men often develop CAD earlier in life than women.  High blood pressure (hypertension).  Diabetes.  High cholesterol levels.  Tobacco use.  Excessive alcohol use.  Lack of exercise.  A diet  high in saturated and trans fats, such as fried food and processed meat.  Other possible risk factors include:  High stress levels.  Depression.  Obesity.  Sleep apnea.  What are the signs or symptoms? Many people do not have any symptoms during the early stages of CAD. As the condition progresses, symptoms may include:  Chest pain (angina). The pain can: ? Feel like a crushing or squeezing, or a tightness, pressure, fullness, or heaviness in the chest. ? Last more than a few minutes or can stop and recur. The pain tends to get worse with exercise or stress and to fade with rest.  Pain in the arms, neck, jaw, or back.  Unexplained heartburn or indigestion.  Shortness of breath.  Nausea or vomiting.  Sudden light-headedness.  Sudden cold sweats.  Fluttering or fast heartbeat (palpitations).  How is this diagnosed? This condition is diagnosed based on:  Your family and medical history.  A physical exam.  Tests, including: ? A test to check the electrical signals in your heart (electrocardiogram). ? Exercise stress test. This looks for signs of blockage when the heart is stressed with exercise, such as running on a treadmill. ? Pharmacologic stress test. This test looks for signs of blockage when the heart is being stressed with a medicine. ? Blood tests. ? Coronary angiogram. This is a procedure to look at the coronary arteries to see if there is any blockage. During this test, a dye is injected into your arteries so they appear on an X-ray. ?  A test that uses sound waves to take a picture of your heart (echocardiogram). ? Chest X-ray.  How is this treated? This condition may be treated by:  Healthy lifestyle changes to reduce risk factors.  Medicines such as: ? Antiplatelet medicines and blood-thinning medicines, such as aspirin. These help to prevent blood clots. ? Nitroglycerin. ? Blood pressure medicines. ? Cholesterol-lowering medicine.  Coronary  angioplasty and stenting. During this procedure, a thin, flexible tube is inserted through a blood vessel and into a blocked artery. A balloon or similar device on the end of the tube is inflated to open up the artery. In some cases, a small, mesh tube (stent) is inserted into the artery to keep it open.  Coronary artery bypass surgery. During this surgery, veins or arteries from other parts of the body are used to create a bypass around the blockage and allow blood to reach your heart.  Follow these instructions at home: Medicines  Take over-the-counter and prescription medicines only as told by your health care provider.  Do not take the following medicines unless your health care provider approves: ? NSAIDs, such as ibuprofen, naproxen, or celecoxib. ? Vitamin supplements that contain vitamin A, vitamin E, or both. Lifestyle  Follow an exercise program approved by your health care provider. Aim for 150 minutes of moderate exercise or 75 minutes of vigorous exercise each week.  Maintain a healthy weight or lose weight as approved by your health care provider.  Rest when you are tired.  Learn to manage stress or try to limit your stress. Ask your health care provider for suggestions if you need help.  Get screened for depression and seek treatment, if needed.  Do not use any products that contain nicotine or tobacco, such as cigarettes and e-cigarettes. If you need help quitting, ask your health care provider.  Do not use illegal drugs. Eating and drinking  Follow a heart-healthy diet. A dietitian can help educate you about healthy food options and changes. In general, eat plenty of fruits and vegetables, lean meats, and whole grains.  Avoid foods high in: ? Sugar. ? Salt (sodium). ? Saturated fat, such as processed or fatty meat. ? Trans fat, such as fried foods.  Use healthy cooking methods such as roasting, grilling, broiling, baking, poaching, steaming, or stir-frying.  If  you drink alcohol, and your health care provider approves, limit your alcohol intake to no more than 2 drinks per day. One drink equals 12 ounces of beer, 5 ounces of wine, or 1 ounces of hard liquor. General instructions  Manage any other health conditions, such as hypertension and diabetes. These conditions affect your heart.  Your health care provider may ask you to monitor your blood pressure. Ideally, your blood pressure should be below 130/80.  Keep all follow-up visits as told by your health care provider. This is important. Get help right away if:  You have pain in your chest, neck, arm, jaw, stomach, or back that: ? Lasts more than a few minutes. ? Is recurring. ? Is not relieved by taking medicine under your tongue (sublingualnitroglycerin).  You have too much (profuse) sweating without cause.  You have unexplained: ? Heartburn or indigestion. ? Shortness of breath or difficulty breathing. ? Fluttering or fast heartbeat (palpitations). ? Nausea or vomiting. ? Fatigue. ? Feelings of nervousness or anxiety. ? Weakness. ? Diarrhea.  You have sudden light-headedness or dizziness.  You faint.  You feel like hurting yourself or think about taking your own life. These  symptoms may represent a serious problem that is an emergency. Do not wait to see if the symptoms will go away. Get medical help right away. Call your local emergency services (911 in the [place] Do not drive yourself to the hospital. Summary  Coronary artery disease (CAD) is a process in which the arteries that lead to the heart (coronary arteries) become narrow or blocked. The narrowing or blockage can lead to a heart attack.  Many people do not have any symptoms during the early stages of CAD. This is called "silent CAD."  CAD can be treated with lifestyle changes, medicines, surgery, or a combination of these treatments. This information is not intended to replace advice given to you by your health care  provider. Make sure you discuss any questions you have with your health care provider. Document Released: 05/01/2014 Document Revised: 09/24/2016 Document Reviewed: 09/24/2016 Elsevier Interactive Patient Education  2018 [place] Refer to this sheet in the next few weeks. These instructions provide you with information about caring for yourself after your procedure. Your health care provider may also give you more specific instructions. Your treatment has been planned according to current medical practices, but problems sometimes occur. Call your health care provider if you have any problems or questions after your procedure. What can I expect after the procedure? After your procedure, it is typical to have the following:  Bruising at the radial site that usually fades within 1-2 weeks.  Blood collecting in the tissue (hematoma) that may be painful to the touch. It should usually decrease in size and tenderness within 1-2 weeks.  Follow these instructions at home:  Take medicines only as directed by your health care provider.  You may shower 24-48 hours after the procedure or as directed by your health care provider. Remove the bandage (dressing) and gently wash the site with plain soap and water. Pat the area dry with a clean towel. Do not rub the site, because this may cause bleeding.  Do not take baths, swim, or use a hot tub until your health care provider approves.  Check your insertion site every day for redness, swelling, or drainage.  Do not apply powder or lotion to the site.  Do not flex or bend the affected arm for 24 hours or as directed by your health care provider.  Do not push or pull heavy objects with the affected arm for 24 hours or as directed by your health care provider.  Do not lift over 10 lb (4.5 kg) for 5 days after your procedure or as directed by your health care provider.  Ask your health care provider when it is okay  to: ? Return to work or school. ? Resume usual physical activities or sports. ? Resume sexual activity.  Do not drive home if you are discharged the same day as the procedure. Have someone else drive you.  You may drive 24 hours after the procedure unless otherwise instructed by your health care provider.  Do not operate machinery or power tools for 24 hours after the procedure.  If your procedure was done as an outpatient procedure, which means that you went home the same day as your procedure, a responsible adult should be with you for the first 24 hours after you arrive home.  Keep all follow-up visits as directed by your health care provider. This is important. Contact a health care provider if:  You have a fever.  You have chills.  You have increased bleeding from the radial site. Hold pressure on the site. Get help right away if:  You have unusual pain at the radial site.  You have redness, warmth, or swelling at the radial site.  You have drainage (other than a small amount of blood on the dressing) from the radial site.  The radial site is bleeding, and the bleeding does not stop after 30 minutes of holding steady pressure on the site.  Your arm or hand becomes pale, cool, tingly, or numb. This information is not intended to replace advice given to you by your health care provider. Make sure you discuss any questions you have with your health care provider. Document Released: 11/06/2010 Document Revised: 03/11/2016 Document Reviewed: 04/22/2014 Elsevier Interactive Patient Education  2018 [employer].

## 2018-09-14 NOTE — Progress Notes (Signed)
Progress Note  Patient Name: Devon Mathews Date of Encounter: 09/14/2018  Primary Cardiologist: Skeet Latch, MD   Subjective   Feeling well.  No chest pain or shortness of breath.    Inpatient Medications    Scheduled Meds: . angioplasty book   Does not apply Once  . aspirin  81 mg Oral Daily  . atorvastatin  80 mg Oral q1800  . heparin  5,000 Units Subcutaneous Q8H  . metoprolol succinate  50 mg Oral Daily  . sodium chloride flush  3 mL Intravenous Q12H  . ticagrelor  90 mg Oral BID   Continuous Infusions: . sodium chloride     PRN Meds: sodium chloride, acetaminophen, amitriptyline, nitroGLYCERIN, ondansetron (ZOFRAN) IV, oxyCODONE, sodium chloride flush   Vital Signs    Vitals:   09/13/18 2013 09/13/18 2100 09/14/18 0716 09/14/18 0800  BP: (!) 119/53 134/70 (!) 147/75   Pulse: 65 64 68   Resp: 16 17 20  (!) 21  Temp: 97.9 F (36.6 C)  98.3 F (36.8 C)   TempSrc: Oral  Oral   SpO2: 98% 98% 94%   Weight:   127 kg   Height:        Intake/Output Summary (Last 24 hours) at 09/14/2018 0835 Last data filed at 09/13/2018 2200 Gross per 24 hour  Intake 949.49 ml  Output 300 ml  Net 649.49 ml   Filed Weights   09/13/18 0400 09/13/18 0610 09/14/18 0716  Weight: 129.7 kg 127.2 kg 127 kg    Telemetry    Sinus rhythm.  PACs. - Personally Reviewed  ECG    Sinus rhythm.  Rate 67 bpm.  - Personally Reviewed  Physical Exam   VS:  BP (!) 147/75   Pulse 68   Temp 98.3 F (36.8 C) (Oral)   Resp (!) 21   Ht 5\' 9"  (1.753 m)   Wt 127 kg   SpO2 94%   BMI 41.35 kg/m  , BMI Body mass index is 41.35 kg/m. GENERAL:  Well appearing HEENT: Pupils equal round and reactive, fundi not visualized, oral mucosa unremarkable NECK:  No jugular venous distention, waveform within normal limits, carotid upstroke brisk and symmetric, no bruits LUNGS:  Clear to auscultation bilaterally HEART:  RRR.  PMI not displaced or sustained,S1 and S2 within normal limits, no S3, no  S4, no clicks, no rubs, no murmurs ABD:  Flat, positive bowel sounds normal in frequency in pitch, no bruits, no rebound, no guarding, no midline pulsatile mass, no hepatomegaly, no splenomegaly EXT:  2 plus pulses throughout, no edema, no cyanosis no clubbing.  R radial without hematoma or ecchymosis.  SKIN:  No rashes no nodules NEURO:  Cranial nerves II through XII grossly intact, motor grossly intact throughout [facility]:  Cognitively intact, oriented to person place and time   Labs    Chemistry Recent Labs  Lab 09/12/18 2145 09/14/18 0230  NA 138 138  K 4.7 4.2  CL 106 110  CO2 25 20*  GLUCOSE 137* 130*  BUN 13 14  CREATININE 1.18 0.92  CALCIUM 9.5 9.2  GFRNONAA >60 >60  GFRAA >60 >60  ANIONGAP 7 8     Hematology Recent Labs  Lab 09/12/18 2145 09/14/18 0230  WBC 5.3 6.0  RBC 4.09* 4.09*  HGB 14.2 14.1  HCT 41.8 41.1  MCV 102.2* 100.5*  MCH 34.7* 34.5*  MCHC 34.0 34.3  RDW 12.5 12.6  PLT 148* 155    Cardiac Enzymes Recent Labs  Lab 09/13/18  0320  TROPONINI 0.20*    Recent Labs  Lab 09/13/18 0056 09/13/18 0404  TROPIPOC 0.13* 0.15*     BNPNo results for input(s): BNP, PROBNP in the last 168 hours.   DDimer No results for input(s): DDIMER in the last 168 hours.   Radiology    Dg Chest 2 View  Result Date: 09/12/2018 CLINICAL DATA:  Chest pain EXAM: CHEST - 2 VIEW COMPARISON:  06/23/2008 FINDINGS: No acute opacity or pleural effusion. Mild cardiomegaly. Degenerative osteophytes of the spine. IMPRESSION: No active cardiopulmonary disease.  Mild cardiomegaly Electronically Signed   By: Donavan Foil M.D.   On: 09/12/2018 23:01    Cardiac Studies   LHC 09/13/18:  Non-ST elevation MI  High-grade, 95% proximal to mid RCA between the first diagonal and second septal perforator with TIMI grade III flow.  The remainder of the LAD is without significant obstruction.  Successful drug-eluting stent implantation reducing the 95% stenosis to 0% with TIMI  grade III flow using a 16 x 3 5 Synergy postdilated to 3.75 mm in diameter.  20% distal left main.  Circumflex artery is widely patent without significant obstruction.  Normal small ramus intermedius branch.  Normal codominant RCA  Normal LV systolic function with upper normal LVEDP of 18 mmHg.  LVEF is 65%.  RECOMMENDATIONS:   Aggressive risk factor modification.  Have changed simvastatin to atorvastatin 80 mg/day.  Have increase Toprol-XL to 50 mg/day.  If triglycerides are elevated, consider Vascepa  Hemoglobin A1c should be obtained.  Dual antiplatelet therapy, aspirin and Brilinta for 1 to 3 months, then okay to drop to monotherapy using Brilinta only.  Classically, dual antiplatelet therapy should be continued for 12 months as listed below.  Practically, based on recent data, monotherapy without aspirin decreases risk of bleeding at no increased risk of ischemic events.  Patient Profile     67 y.o. male with CAD, hypertension, mild AR, OSA and hyperlipidemia here with NSTEMI.  Assessment & Plan    # NSTEMI: # CAD s/p PCI: Mr. Devon Mathews underwent PCI of the proximal LAD with a Synergy DES with 0 residual stenosis.  Plan for DAPT for 1-3 months then ticagrelor alone per Dr. Tamala Julian.  His statin was switched this admission so he will need repeat lipids/CMP in 6-8 weeks.  Continue metoprolol.  LVEF was normal on LVgram.  # Hypertension: Continue metoprolol.  Dose increased this admission.  BP poorly controlled and he has mild edema.  Will add HCTZ 12.5mg .  Check BMP in 1 week.    For questions or updates, please contact [place] Please consult [email or web address] for contact info under        Signed, Skeet Latch, MD  09/14/2018, 8:35 AM

## 2018-09-14 NOTE — Progress Notes (Signed)
b

## 2018-09-15 ENCOUNTER — Encounter (HOSPITAL_COMMUNITY): Payer: Self-pay | Admitting: Interventional Cardiology

## 2018-09-18 ENCOUNTER — Other Ambulatory Visit: Payer: Self-pay | Admitting: Cardiovascular Disease

## 2018-09-18 ENCOUNTER — Telehealth: Payer: Self-pay

## 2018-09-18 ENCOUNTER — Other Ambulatory Visit: Payer: Self-pay | Admitting: Orthopedic Surgery

## 2018-09-18 DIAGNOSIS — M25511 Pain in right shoulder: Secondary | ICD-10-CM

## 2018-09-18 NOTE — Telephone Encounter (Signed)
   [facility] Pre-operative Risk Assessment    Request for surgical clearance:  1. What type of surgery is being performed? Arthrogram/CT   2. When is this surgery scheduled? TBD   3. What type of clearance is required (medical clearance vs. Pharmacy clearance to hold med vs. Both)? Pharmacy  4. Are there any medications that need to be held prior to surgery and how long? Brilinta- 5 days   5. Practice name and name of physician performing surgery? [facility]   6. What is your office phone number [phone]    7.   What is your office fax number [phone]  8.   Anesthesia type (None, local, MAC, general) ? Unknown   Meryl Crutch 09/18/2018, 3:48 PM  _________________________________________________________________   (provider comments below)

## 2018-09-19 NOTE — Telephone Encounter (Signed)
Called pt re: surgical clearance. (see message below) Left a message for pt to call back and ask fr preop pool.

## 2018-09-19 NOTE — Telephone Encounter (Signed)
   Primary Cardiologist: Skeet Latch, MD  Chart reviewed as part of pre-operative protocol coverage. Patient has had recent NSTEMI with PCI. He is on DAPT therapy with Brilinta for the next 12 months. He is not able to have requesting procedure at this time. He does have follow up in the office later this month. Can discuss further at that time.   Pre-op covering staff: - Please call patient to inform them. - Please contact requesting surgeon's office via preferred method (i.e, phone, fax) to inform them of need to cancel his procedure.   Truitt Merle, NP 09/19/2018, 9:45 AM

## 2018-09-20 ENCOUNTER — Telehealth (HOSPITAL_COMMUNITY): Payer: Self-pay | Admitting: *Deleted

## 2018-09-20 NOTE — Telephone Encounter (Signed)
LMOM for pt to call our office about recommendation on for surgical clearance.

## 2018-09-21 NOTE — Telephone Encounter (Signed)
Spoke with pt and he has been made aware that the Arthrogram/CT that they are requesting clearance, needed to be postponed due to DAPT. Pt understands he can discuss further at his upcoming appt. Pt verbalized understanding.

## 2018-09-21 NOTE — Telephone Encounter (Signed)
Patient returned call please call back

## 2018-09-26 ENCOUNTER — Encounter: Payer: Self-pay | Admitting: Cardiology

## 2018-09-26 ENCOUNTER — Ambulatory Visit (INDEPENDENT_AMBULATORY_CARE_PROVIDER_SITE_OTHER): Payer: BLUE CROSS/BLUE SHIELD | Admitting: Cardiology

## 2018-09-26 VITALS — BP 118/64 | HR 64 | Ht 69.0 in | Wt 270.0 lb

## 2018-09-26 DIAGNOSIS — I2510 Atherosclerotic heart disease of native coronary artery without angina pectoris: Secondary | ICD-10-CM | POA: Diagnosis not present

## 2018-09-26 DIAGNOSIS — I10 Essential (primary) hypertension: Secondary | ICD-10-CM

## 2018-09-26 DIAGNOSIS — I214 Non-ST elevation (NSTEMI) myocardial infarction: Secondary | ICD-10-CM | POA: Diagnosis not present

## 2018-09-26 DIAGNOSIS — G4733 Obstructive sleep apnea (adult) (pediatric): Secondary | ICD-10-CM | POA: Diagnosis not present

## 2018-09-26 DIAGNOSIS — E669 Obesity, unspecified: Secondary | ICD-10-CM

## 2018-09-26 DIAGNOSIS — Z9861 Coronary angioplasty status: Secondary | ICD-10-CM

## 2018-09-26 NOTE — Assessment & Plan Note (Signed)
Compliant with CPAP 

## 2018-09-26 NOTE — Assessment & Plan Note (Signed)
LAD PCI with DES 09/14/2018

## 2018-09-26 NOTE — Progress Notes (Signed)
09/26/2018 Morrie Sheldon   02-18-1951  [id number]  Primary Physician Tisovec, [name], MD Primary Cardiologist: Dr Oval [name]  HPI: Mr. Wamser is a pleasant 67 year old male who is now followed by Dr. Oval [name].  His father, Jewell Ryans, Sr, was a longtime patient of Dr. Lowella Fairy.  The patient -Haakon, [place] had a heart catheterization in 2003 that showed a 40% LAD lesion.  Relook cath in 2006 again showed a 40% LAD lesion.  He had several Myoview's following this, the last one was in 2017, they were all low risk.  Echocardiogram in October 2017 showed good LV function with mild LVH.  Other medical problems include a family history of coronary disease, HTN, obesity, and sleep apnea.  The patient presented to [facility]/13/2018 with chest pain consistent with unstable angina.  He ruled in for a non-STEMI with a troponin peak of 0.15.  He underwent diagnostic catheterization and LAD stenting on 09/14/2018.  He had 20% left main narrowing but no other significant disease.  His LV function was preserved.  He was started on lipid therapy, low-dose diuretic, and his beta blocker was increased.  He seen in the office today in follow-up.  He admits that since he has been home he has had intermittent chest tightness.  It's not severe and is not is nearly as bad as when he was admitted.  He has not had to use nitroglycerin.  He has a sensation of vague shortness of breath at times.  He has lost some weight, he is down 10 pounds since his preadmission weight.  He is compliant with his CPAP.  He never smoked.   Current Outpatient Medications  Medication Sig Dispense Refill  . amitriptyline (ELAVIL) 25 MG tablet Take 25 mg by mouth at bedtime as needed for sleep.    Marland Kitchen aspirin EC 81 MG tablet Take 81 mg by mouth daily.    Marland Kitchen atorvastatin (LIPITOR) 80 MG tablet Take 1 tablet (80 mg total) by mouth daily at 6 PM. 30 tablet 11  . hydrochlorothiazide (MICROZIDE) 12.5 MG capsule Take 1 capsule (12.5 mg total) by  mouth daily. 30 capsule 11  . metoprolol succinate (TOPROL-XL) 50 MG 24 hr tablet Take 1 tablet (50 mg total) by mouth daily. Take with or immediately following a meal. 30 tablet 11  . nitroGLYCERIN (NITROSTAT) 0.4 MG SL tablet Place 1 tablet (0.4 mg total) under the tongue every 5 (five) minutes x 3 doses as needed for chest pain. 25 tablet 11  . pantoprazole (PROTONIX) 40 MG tablet TAKE ONE TABLET BY MOUTH ONE TIME DAILY. must make follow up appointment for further refills (Patient taking differently: Take 40 mg by mouth daily. ) 15 tablet 0  . ticagrelor (BRILINTA) 90 MG TABS tablet Take 1 tablet (90 mg total) by mouth 2 (two) times daily. 60 tablet 0   No current facility-administered medications for this visit.     Allergies  Allergen Reactions  . Penicillins Other (See Comments) and Hives    ALL OF THE ABOVE     Past Medical History:  Diagnosis Date  . CAD (coronary artery disease)    Echo, EF =>55%, normal findings  . Chest pain 07/28/2016  . Chest pain, atypical 2012 & 2009   stress myo, EF 65% 2012/ stress myo, EF 65% 2009  . Dizziness 2012   normal findings..  . Essential hypertension 07/28/2016  . Lower extremity edema 07/28/2016  . Obesity   . Obesity (BMI 30-39.9) 07/28/2016  .  OSA (obstructive sleep apnea) 07/28/2016  . Restless leg syndrome    mild  . Shortness of breath 07/28/2016    Social History   Socioeconomic History  . Marital status: Married    Spouse name: Not on file  . Number of children: Not on file  . Years of education: Not on file  . Highest education level: Not on file  Occupational History  . Not on file  Social Needs  . Financial resource strain: Not on file  . Food insecurity:    Worry: Not on file    Inability: Not on file  . Transportation needs:    Medical: Not on file    Non-medical: Not on file  Tobacco Use  . Smoking status: Never Smoker  . Smokeless tobacco: Never Used  Substance and Sexual Activity  . Alcohol use: No    . Drug use: No  . Sexual activity: Not on file  Lifestyle  . Physical activity:    Days per week: Not on file    Minutes per session: Not on file  . Stress: Not on file  Relationships  . Social connections:    Talks on phone: Not on file    Gets together: Not on file    Attends religious service: Not on file    Active member of club or organization: Not on file    Attends meetings of clubs or organizations: Not on file    Relationship status: Not on file  . Intimate partner violence:    Fear of current or ex partner: Not on file    Emotionally abused: Not on file    Physically abused: Not on file    Forced sexual activity: Not on file  Other Topics Concern  . Not on file  Social History Narrative  . Not on file     Family History  Problem Relation Age of Onset  . Heart failure Mother   . Other Father        bypass surgery  . Heart disease Father   . Heart failure Father   . COPD Father   . Heart attack Sister   . Diabetes Sister   . Heart failure Sister   . Diabetes Brother   . Other Maternal Grandfather        smoking related disease  . Heart attack Paternal Grandmother      Review of Systems: General: negative for chills, fever, night sweats or weight changes.  Cardiovascular: negative for dyspnea on exertion, edema, orthopnea, palpitations, paroxysmal nocturnal dyspnea  Dermatological: negative for rash Respiratory: negative for cough or wheezing Urologic: negative for hematuria Abdominal: negative for nausea, vomiting, diarrhea, bright red blood per rectum, melena, or hematemesis Neurologic: negative for visual changes, syncope, or dizziness All other systems reviewed and are otherwise negative except as noted above.    Blood pressure 118/64, pulse 64, height 5\' 9"  (1.753 m), weight 270 lb (122.5 kg).  General appearance: alert, cooperative, no distress and morbidly obese Lungs: clear to auscultation bilaterally Heart: regular rate and  rhythm Extremities: no edema Skin: Skin color, texture, turgor normal. No rashes or lesions Neurologic: Grossly normal  EKG NSR, 71  ASSESSMENT AND PLAN:   NSTEMI (non-ST elevated myocardial infarction) ([place]) The patient presented 09/13/2018 with a non-ST elevation MI, troponin peak was 0.15  CAD S/P percutaneous coronary angioplasty LAD PCI with DES 09/14/2018  OSA (obstructive sleep apnea) Compliant with CPAP  Obesity (BMI 30-39.9) He has lost 10 pounds from his preadmission  weight.  He admits his diet was pretty poor before he went in the hospital and this is been somewhat of a wake-up call for him.   PLAN I think his mild shortness of breath is from [place].  I am not too concerned about his history of chest discomfort as it sounds very mild and atypical compared to his presentation.  Check BMP today as the HCTZ is new.  I told him he could go back to work and ease back into his usual routine in 2 weeks.  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  He should be able to start cardiac rehab in 2 or 3 weeks.  He has an appointment with Dr. Oval [name] in February and will keep this.  On 09/18/2018 we received a preop clearance request from [facility] imaging for an arthrogram CT study.  The patient is not cleared to stop his antiplatelet therapy for this.  He has been made aware.  He did have a lipid panel in the hospital that showed a total cholesterol of 132 and HDL of 39 and LDL of 69 and triglycerides of 118.  Mackson Botz PA-C 09/26/2018 3:57 PM

## 2018-09-26 NOTE — Assessment & Plan Note (Signed)
The patient presented 09/13/2018 with a non-ST elevation MI, troponin peak was 0.15

## 2018-09-26 NOTE — Assessment & Plan Note (Signed)
He has lost 10 pounds from his preadmission weight.  He admits his diet was pretty poor before he went in the hospital and this is been somewhat of a wake-up call for him.

## 2018-09-26 NOTE — Patient Instructions (Signed)
Medication Instructions:  Your physician recommends that you continue on your current medications as directed. Please refer to the Current Medication list given to you today.  If you need a refill on your cardiac medications before your next appointment, please call your pharmacy.   Lab work: Your physician recommends that you return for lab work in: TODAY-BMET  If you have labs (blood work) drawn today and your tests are completely normal, you will receive your results only by: Marland Kitchen MyChart Message (if you have MyChart) OR . A paper copy in the mail If you have any lab test that is abnormal or we need to change your treatment, we will call you to review the results.  Testing/Procedures: NONE   Follow-Up: At [facility], you and your health needs are our priority.  As part of our continuing mission to provide you with exceptional heart care, we have created designated Provider Care Teams.  These Care Teams include your primary Cardiologist (physician) and Advanced Practice Providers (APPs -  Physician Assistants and Nurse Practitioners) who all work together to provide you with the care you need, when you need it. Marland Kitchen LUKE RECOMMENDS YOU FOLLOW UP AS SCHEDULED.  Any Other Special Instructions Will Be Listed Below (If Applicable). YOU MAY RESUME NORMAL ACTIVITY IN 2 WEEK AND START CARDIAC REHAB IN 3 WEEKS

## 2018-09-27 ENCOUNTER — Other Ambulatory Visit: Payer: Self-pay

## 2018-09-27 ENCOUNTER — Other Ambulatory Visit: Payer: Self-pay | Admitting: Cardiology

## 2018-09-27 DIAGNOSIS — N289 Disorder of kidney and ureter, unspecified: Secondary | ICD-10-CM

## 2018-09-27 LAB — BASIC METABOLIC PANEL
BUN/Creatinine Ratio: 16 (ref 10–24)
BUN: 21 mg/dL (ref 8–27)
CO2: 22 mmol/L (ref 20–29)
Calcium: 9.7 mg/dL (ref 8.6–10.2)
Chloride: 103 mmol/L (ref 96–106)
Creatinine, Ser: 1.3 mg/dL — ABNORMAL HIGH (ref 0.76–1.27)
GFR calc Af Amer: 66 mL/min/{1.73_m2} (ref >59)
GFR calc non Af Amer: 57 mL/min/{1.73_m2} — ABNORMAL LOW (ref >59)
Glucose: 92 mg/dL (ref 65–99)
Potassium: 4.6 mmol/L (ref 3.5–5.2)
Sodium: 142 mmol/L (ref 134–144)

## 2018-09-27 NOTE — Progress Notes (Signed)
SCr 1.3 after PCI- recheck in two weeks.  Kerin Ransom PA-C 09/27/2018 8:03 AM

## 2018-10-01 ENCOUNTER — Other Ambulatory Visit (HOSPITAL_COMMUNITY): Payer: Self-pay | Admitting: *Deleted

## 2018-10-01 DIAGNOSIS — Z955 Presence of coronary angioplasty implant and graft: Secondary | ICD-10-CM

## 2018-10-01 DIAGNOSIS — I214 Non-ST elevation (NSTEMI) myocardial infarction: Secondary | ICD-10-CM

## 2018-10-06 ENCOUNTER — Telehealth: Payer: Self-pay

## 2018-10-06 NOTE — Telephone Encounter (Signed)
Called to schedule initial care guide visit. Schedule for 8/23 at 8:30

## 2018-10-09 ENCOUNTER — Ambulatory Visit: Payer: BLUE CROSS/BLUE SHIELD

## 2018-10-09 ENCOUNTER — Telehealth (HOSPITAL_COMMUNITY): Payer: Self-pay

## 2018-10-09 ENCOUNTER — Other Ambulatory Visit: Payer: Self-pay

## 2018-10-09 ENCOUNTER — Telehealth: Payer: Self-pay | Admitting: *Deleted

## 2018-10-09 ENCOUNTER — Encounter: Payer: Self-pay | Admitting: Cardiovascular Disease

## 2018-10-09 DIAGNOSIS — N289 Disorder of kidney and ureter, unspecified: Secondary | ICD-10-CM

## 2018-10-09 DIAGNOSIS — Z0000 Encounter for general adult medical examination without abnormal findings: Secondary | ICD-10-CM

## 2018-10-09 LAB — BASIC METABOLIC PANEL
BUN/Creatinine Ratio: 18 (ref 10–24)
BUN: 18 mg/dL (ref 8–27)
CO2: 23 mmol/L (ref 20–29)
Calcium: 9.2 mg/dL (ref 8.6–10.2)
Chloride: 102 mmol/L (ref 96–106)
Creatinine, Ser: 1.02 mg/dL (ref 0.76–1.27)
GFR calc Af Amer: 88 mL/min/{1.73_m2} (ref >59)
GFR calc non Af Amer: 76 mL/min/{1.73_m2} (ref >59)
Glucose: 112 mg/dL — ABNORMAL HIGH (ref 65–99)
Potassium: 4.3 mmol/L (ref 3.5–5.2)
Sodium: 139 mmol/L (ref 134–144)

## 2018-10-09 NOTE — Telephone Encounter (Signed)
Spoke with patient and he has had several episodes of chest pressure described as being just like when he had is heart attack. Denies and other symptoms. He did not use his NTG. Each episode lasted 5-20 minutes. Patient stated multiple times that the pain was like his heart attack just not as intense. Discussed with Doreene Burke PA patient needs visti with EKG to make sure no changes. Left message to call back and scheduled visit for Thursday with Dr Oval [name]

## 2018-10-09 NOTE — Telephone Encounter (Signed)
New message ° ° °Patient is returning call for lab results. °

## 2018-10-09 NOTE — Telephone Encounter (Signed)
Attempted to call patient in regards to Cardiac Rehab - LM on VM 

## 2018-10-09 NOTE — Telephone Encounter (Signed)
This encounter was created in error - please disregard.

## 2018-10-09 NOTE — Telephone Encounter (Signed)
Pt insurance is active and benefits verified through [employer]. Co-pay $0.00, DED $500.00/$500.00 met, out of pocket $5,000.00/$2,210.04 met, co-insurance 20%. No pre-authorization required. Passport, 10/09/18 @ 9:24AM, REF# [id number]

## 2018-10-09 NOTE — Telephone Encounter (Signed)
Advised patient, verbalized understanding  

## 2018-10-09 NOTE — Telephone Encounter (Signed)
-----   Message from Antionette Char sent at 10/09/2018  9:35 AM EST ----- Regarding: Pt update Good Morning,   I recently saw a patient of yours this morning who is requesting consent to engage in moderate physical activity (walking the treadmill 3x a week). He stated that Dr. Rosalee Kaufman had put him on light duty for work for 2 weeks, and wanted to be sure that he can resume work as normal.  Also, pt did mention that he has felt a slight pressure or squeezing sensation more than once in the past week. Says he wasn't too concerned, but wanted to mention it to his providers just in case there was anything he needed to do concerning his medication.   Let me know your thoughts.  Happy Holidays,  [place]

## 2018-10-09 NOTE — Progress Notes (Signed)
Week: 1  Progress Notes:  Pt is currently weighing 268.2lbs. Has recently started reducing carb consumption, red meat, sodas, and fast food. Is looking to increase physical activity.  Challenges:  Pt is worried about physical activity as Dr. Rosalyn Gess put him on light duty for work for 2wks. Would like to get provider approval before engaging in moderate physical activity. Pt also mentioned experiencing some pressure or a squeezing sensation in his chest in the past week.  Opportunities:  Pt has treadmill at home and would like to use that to set a goal for walking 1 mile per week (3 days a week). Is also interested in herballife. Advised pt to always read nutritional labels and to be mindful of some of the diet products as they can be diuretics and laxatives.  Client Commitment/Agreement for Next Session:  Pt would like care guide to reach out to providers for consent to be taken off of light duty with work and to engage on moderate physical activity. Will have phone call update the week of 1/6

## 2018-10-12 ENCOUNTER — Ambulatory Visit: Payer: BLUE CROSS/BLUE SHIELD | Admitting: Cardiovascular Disease

## 2018-10-12 ENCOUNTER — Encounter: Payer: Self-pay | Admitting: Cardiovascular Disease

## 2018-10-12 VITALS — BP 126/70 | HR 66 | Ht 69.0 in | Wt 269.0 lb

## 2018-10-12 DIAGNOSIS — E7800 Pure hypercholesterolemia, unspecified: Secondary | ICD-10-CM

## 2018-10-12 DIAGNOSIS — Z9861 Coronary angioplasty status: Secondary | ICD-10-CM

## 2018-10-12 DIAGNOSIS — E669 Obesity, unspecified: Secondary | ICD-10-CM

## 2018-10-12 DIAGNOSIS — G4733 Obstructive sleep apnea (adult) (pediatric): Secondary | ICD-10-CM

## 2018-10-12 DIAGNOSIS — I2510 Atherosclerotic heart disease of native coronary artery without angina pectoris: Secondary | ICD-10-CM | POA: Diagnosis not present

## 2018-10-12 DIAGNOSIS — I10 Essential (primary) hypertension: Secondary | ICD-10-CM | POA: Diagnosis not present

## 2018-10-12 NOTE — Patient Instructions (Signed)
Medication Instructions:  TAKE PROTONIX AT BEDTIME If you need a refill on your cardiac medications before your next appointment, please call your pharmacy.   Lab work: Your physician recommends that you return for lab work PRIOR TO EATING If you have labs (blood work) drawn today and your tests are completely normal, you will receive your results only by: Marland Kitchen MyChart Message (if you have MyChart) OR . A paper copy in the mail If you have any lab test that is abnormal or we need to change your treatment, we will call you to review the results.  Follow-Up: At [facility], you and your health needs are our priority.  As part of our continuing mission to provide you with exceptional heart care, we have created designated Provider Care Teams.  These Care Teams include your primary Cardiologist (physician) and Advanced Practice Providers (APPs -  Physician Assistants and Nurse Practitioners) who all work together to provide you with the care you need, when you need it. You will need a follow up appointment in 4 months.  Please call our office 2 months in advance to schedule this appointment.  You may see Skeet Latch, MD or one of the following Advanced Practice Providers on your designated Care Team:   Kwabena Strutz, PA-C Roby Lofts, [place] . Sande Rives, PA-C

## 2018-10-12 NOTE — Progress Notes (Signed)
Cardiology Office Note   Date:  10/12/2018   ID:  Devon Mathews, DOB 1951-08-12, MRN [id number]  PCP:  Haywood Pao, MD  Cardiologist:   Skeet Latch, MD   No chief complaint on file.   History of Present Illness: Devon Mathews is a 67 y.o. male with CAD, mild AR, hypertension, hyperlipidemia, OSA and obestiy who presents for follow up.  Devon Mathews was previously seen by Dr. Rollene Fare and by Dr. Einar Gip.  He last saw Dr. Einar Gip 01/2015 and reported mild, chronic dyspnea. He underwent cardiac catheterization in 2003 and 2006 with a 40% LAD lesion noted.  He had an ETT in 2007 that was negative for ischemia and a nuclear stress in 2011 and 2012 that were negative.  He had an echo 07/02/13 that revealed LVEF 55-60% and mild mitral regurgitation. He was seen in clinic 07/2016 and reported several months of chest pain and exertional shortness of breath.  He was referred for exercise Myoview 08/13/16 that revealed LVEF 63% and no ischemia.  He also had an echo 08/16/16 with LVEF 60-65%, mild LVH, mild AR and mild TR.  Devon Mathews had a sleep study in 2014 that demonstrated moderate OSA. He continues to be compliant with CPAP.    Devon Mathews had an NSTEMI 08/2018.  Troponin peaked at 0.15.  He underwent LHC and had a 95% mid LAD lesion that was successfully stented with a DES.  20% distal left main disease was also noted.  LVEF was 65% on left ventriculogram.  Simvastatin was switched to atorvastatin and metoprolol was increased to 50 mg daily.  Plans were made to continue dual antiplatelet therapy for 1 to 3 months followed by ticagrelor alone.  Followed up with Kerin Ransom, PA-C on 09/26/2018 and reported intermittent chest tightness.  He did have some shortness of breath associated with ticagrelor.  Devon Mathews has been feeling well.  He had a couple episodes of chest tightness that awoke him from sleep.  It lasted for a few minutes and got better after he stood up and started walking  around.  He also notes that he has been burping more.  In the past he took pantoprazole at night.  However after his hospitalization he started taking it in the morning.  He has been losing weight by working on his diet.  He has not yet started exercising.  He denies any shortness of breath, exertional chest pain, lower extremity edema, orthopnea, or PND.   Past Medical History:  Diagnosis Date  . CAD (coronary artery disease)    Echo, EF =>55%, normal findings  . Chest pain 07/28/2016  . Chest pain, atypical 2012 & 2009   stress myo, EF 65% 2012/ stress myo, EF 65% 2009  . Dizziness 2012   normal findings..  . Essential hypertension 07/28/2016  . Lower extremity edema 07/28/2016  . Obesity   . Obesity (BMI 30-39.9) 07/28/2016  . OSA (obstructive sleep apnea) 07/28/2016  . Restless leg syndrome    mild  . Shortness of breath 07/28/2016      Current Outpatient Medications  Medication Sig Dispense Refill  . amitriptyline (ELAVIL) 25 MG tablet Take 25 mg by mouth at bedtime as needed for sleep.    Marland Kitchen aspirin EC 81 MG tablet Take 81 mg by mouth daily.    Marland Kitchen atorvastatin (LIPITOR) 80 MG tablet Take 1 tablet (80 mg total) by mouth daily at 6 PM. 30 tablet 11  .  hydrochlorothiazide (MICROZIDE) 12.5 MG capsule Take 1 capsule (12.5 mg total) by mouth daily. 30 capsule 11  . metoprolol succinate (TOPROL-XL) 50 MG 24 hr tablet Take 1 tablet (50 mg total) by mouth daily. Take with or immediately following a meal. 30 tablet 11  . nitroGLYCERIN (NITROSTAT) 0.4 MG SL tablet Place 1 tablet (0.4 mg total) under the tongue every 5 (five) minutes x 3 doses as needed for chest pain. 25 tablet 11  . pantoprazole (PROTONIX) 40 MG tablet TAKE ONE TABLET BY MOUTH ONE TIME DAILY. must make follow up appointment for further refills 15 tablet 0  . ticagrelor (BRILINTA) 90 MG TABS tablet Take 1 tablet (90 mg total) by mouth 2 (two) times daily. 60 tablet 0   No current facility-administered medications for this  visit.     Allergies:   Penicillins    Social History:  The patient  reports that he has never smoked. He has never used smokeless tobacco. He reports that he does not drink alcohol or use drugs.   Family History:  The patient's family history includes COPD in his father; Diabetes in his brother and sister; Heart attack in his paternal grandmother and sister; Heart disease in his father; Heart failure in his father, mother, and sister; Other in his father and maternal grandfather.    ROS:  Please see the history of present illness.   Otherwise, review of systems are positive for none.  All other systems are reviewed and negative.    PHYSICAL EXAM: VS:  BP 126/70   Pulse 66   Ht 5\' 9"  (1.753 m)   Wt 269 lb (122 kg)   BMI 39.72 kg/m  , BMI Body mass index is 39.72 kg/m. GENERAL:  Well appearing HEENT: Pupils equal round and reactive, fundi not visualized, oral mucosa unremarkable NECK:  No jugular venous distention, waveform within normal limits, carotid upstroke brisk and symmetric, no bruits LUNGS:  Clear to auscultation bilaterally HEART:  RRR.  PMI not displaced or sustained,S1 and S2 within normal limits, no S3, no S4, no clicks, no rubs, no murmurs ABD:  Flat, positive bowel sounds normal in frequency in pitch, no bruits, no rebound, no guarding, no midline pulsatile mass, no hepatomegaly, no splenomegaly EXT:  2 plus pulses throughout, no edema, no cyanosis no clubbing SKIN:  No rashes no nodules NEURO:  Cranial nerves II through XII grossly intact, motor grossly intact throughout PSYCH:  Cognitively intact, oriented to person place and time   EKG:  EKG is ordered today. The ekg ordered today demonstrates sinus rhythm. Rate 72 bpm. 08/15/17:  Sinus rhythm rate 70 bpm.   08/21/18: Sinus rhythm.  Rate 78 bpm.   10/12/18: Sinus rhythm.  Rate 66 bpm.   LHC 09/13/18:  Non-ST elevation MI  High-grade, 95% proximal to mid RCA between the first diagonal and second septal  perforator with TIMI grade III flow.  The remainder of the LAD is without significant obstruction.  Successful drug-eluting stent implantation reducing the 95% stenosis to 0% with TIMI grade III flow using a 16 x 3 5 Synergy postdilated to 3.75 mm in diameter.  20% distal left main.  Circumflex artery is widely patent without significant obstruction.  Normal small ramus intermedius branch.  Normal codominant RCA  Normal LV systolic function with upper normal LVEDP of 18 mmHg.  LVEF is 65%.  RECOMMENDATIONS:  Dual antiplatelet therapy, aspirin and Brilinta for 1 to 3 months, then okay to drop to monotherapy using Brilinta only.  Classically, dual antiplatelet therapy should be continued for 12 months as listed below.  Practically, based on recent data, monotherapy without aspirin decreases risk of bleeding at no increased risk of ischemic events.  Echo 08/16/16: LVEF 60-65%, mild LVH.  Mild AR, mild TR, trivial MR.  Echo 07/02/13: Study Conclusions  - Left ventricle: The cavity size was normal. Wall thickness was normal. Systolic function was normal. The estimated ejection fraction was in the range of 55% to 60%. Wall motion was normal; there were no regional wall motion abnormalities. Left ventricular diastolic function parameters were normal. - Mitral valve: Mild regurgitation. Valve area by pressure half-time: 2.06cm^2. - Atrial septum: No defect or patent foramen ovale was identified. Impressions:  - Normal 2D echo-doppler study for age.  Exercise Myoview 08/13/16:   The left ventricular ejection fraction is normal (55-65%).  Nuclear stress EF: 63%.  There was no ST segment deviation noted during stress.  The study is normal.  This is a low risk study.    Persantine Myoview 12/15/10: LVEF 65%.  Negative for ischemia.   Recent Labs: 09/14/2018: Hemoglobin 14.1; Platelets 155 10/09/2018: BUN 18; Creatinine, Ser 1.02; Potassium 4.3; Sodium 139     Lipid Panel    Component Value Date/Time   CHOL 132 09/14/2018 0230   TRIG 118 09/14/2018 0230   HDL 39 (L) 09/14/2018 0230   CHOLHDL 3.4 09/14/2018 0230   VLDL 24 09/14/2018 0230   LDLCALC 69 09/14/2018 0230      Wt Readings from Last 3 Encounters:  10/12/18 269 lb (122 kg)  09/26/18 270 lb (122.5 kg)  09/14/18 279 lb 15.8 oz (127 kg)     ASSESSMENT AND PLAN:   # CAD: # Hyperlipidemia: Mr. Kushnir had a NSTEMI 08/2018.  He underwent PCI of the LAD.  Devon that time he has been doing well.  The only chest pain he has is at night and I suspect it is due to GERD.  He will switch his pantoprazole back to the evening, as this previously worked better for him.  I did ask him to start back walking some for exercise.  He will start in cardiac rehab at the end of next month.  Continue aspirin, ticagrelor, metoprolol, and atorvastatin.  He will return for fasting lipids and a CMP.  # Hypertension: Blood pressure well controlled on hydrochlorothiazide and metoprolol.  # OSA: Continue CPAP.  Current medicines are reviewed at length with the patient today.  The patient does not have concerns regarding medicines.  The following changes have been made:  None.  Labs/ tests ordered today include:   Orders Placed This Encounter  Procedures  . Lipid panel  . Comprehensive Metabolic Panel (CMET)  . EKG 12-Lead     Disposition:   FU with Renatta Shrieves C. Oval [name], MD, [facility] in 4 months.     Signed, Mak Bonny C. Oval [name], MD, [facility]  10/12/2018 1:13 PM    [facility]

## 2018-10-15 ENCOUNTER — Telehealth (HOSPITAL_COMMUNITY): Payer: Self-pay | Admitting: *Deleted

## 2018-10-15 NOTE — Telephone Encounter (Signed)
-----   Message from Skeet Latch, MD sent at 10/15/2018  6:25 PM EST ----- Regarding: RE: Cardiac Rehab Thanks Carlette.  He is OK for cardiac rehab.  I think I signed whatever orders he may need but please let me know if there is something outstanding.  ~Tiffany ----- Message ----- From: Rowe Pavy, RN Sent: 10/01/2018   6:18 PM EST To: Skeet Latch, MD Subject: Cardiac Rehab                                   Hi Dr. Oval [name],  The above patient was discharged from the hospital over the Thanksgiving holiday prior to seen by phase I cardiac rehab Pt was contacted  by phone and expressed interest in participation in  Cardiac Rehab.  Pt seen in follow up on 12/10 by Kerin Ransom PA. Luke referenced atypical chest discomfort that he felt was not cardiac in nature. Cardiac rehab referral has been placed pending your co signature. Please cosign if you agree.   Thanks so much Psychologist, clinical, BSN Cardiac and Training and development officer

## 2018-10-20 DIAGNOSIS — M109 Gout, unspecified: Secondary | ICD-10-CM | POA: Diagnosis not present

## 2018-10-20 DIAGNOSIS — Z125 Encounter for screening for malignant neoplasm of prostate: Secondary | ICD-10-CM | POA: Diagnosis not present

## 2018-10-20 DIAGNOSIS — R7301 Impaired fasting glucose: Secondary | ICD-10-CM | POA: Diagnosis not present

## 2018-10-20 DIAGNOSIS — Z0000 Encounter for general adult medical examination without abnormal findings: Secondary | ICD-10-CM | POA: Diagnosis not present

## 2018-10-20 DIAGNOSIS — I10 Essential (primary) hypertension: Secondary | ICD-10-CM | POA: Diagnosis not present

## 2018-10-20 DIAGNOSIS — R82998 Other abnormal findings in urine: Secondary | ICD-10-CM | POA: Diagnosis not present

## 2018-10-27 DIAGNOSIS — G4733 Obstructive sleep apnea (adult) (pediatric): Secondary | ICD-10-CM | POA: Diagnosis not present

## 2018-10-27 DIAGNOSIS — Z1331 Encounter for screening for depression: Secondary | ICD-10-CM | POA: Diagnosis not present

## 2018-10-27 DIAGNOSIS — Z1389 Encounter for screening for other disorder: Secondary | ICD-10-CM | POA: Diagnosis not present

## 2018-10-27 DIAGNOSIS — Z23 Encounter for immunization: Secondary | ICD-10-CM | POA: Diagnosis not present

## 2018-10-27 DIAGNOSIS — I10 Essential (primary) hypertension: Secondary | ICD-10-CM | POA: Diagnosis not present

## 2018-10-27 DIAGNOSIS — Z0000 Encounter for general adult medical examination without abnormal findings: Secondary | ICD-10-CM | POA: Diagnosis not present

## 2018-10-27 DIAGNOSIS — I2510 Atherosclerotic heart disease of native coronary artery without angina pectoris: Secondary | ICD-10-CM | POA: Diagnosis not present

## 2018-10-27 DIAGNOSIS — E7800 Pure hypercholesterolemia, unspecified: Secondary | ICD-10-CM | POA: Diagnosis not present

## 2018-10-31 DIAGNOSIS — Z1212 Encounter for screening for malignant neoplasm of rectum: Secondary | ICD-10-CM | POA: Diagnosis not present

## 2018-10-31 DIAGNOSIS — H5203 Hypermetropia, bilateral: Secondary | ICD-10-CM | POA: Diagnosis not present

## 2018-10-31 DIAGNOSIS — H3500 Unspecified background retinopathy: Secondary | ICD-10-CM | POA: Diagnosis not present

## 2018-10-31 DIAGNOSIS — H2513 Age-related nuclear cataract, bilateral: Secondary | ICD-10-CM | POA: Diagnosis not present

## 2018-11-09 ENCOUNTER — Telehealth (HOSPITAL_COMMUNITY): Payer: Self-pay

## 2018-11-09 NOTE — Telephone Encounter (Signed)
Cardiac Rehab Medication Review by a Pharmacist  Does the patient  feel that his/her medications are working for him/her?  yes  Has the patient been experiencing any side effects to the medications prescribed?  Yes, cramping but MD is aware and has already given management recommendations  Does the patient measure his/her own blood pressure or blood glucose at home?  no  Does the patient have any problems obtaining medications due to transportation or finances?   no  Understanding of regimen: good Understanding of indications: good Potential of compliance: good    Pharmacist comments: Patient knew his medications and seemed to be very compliant. He is experiencing some cramping from Brilinta but has already spoken with his doctor about it.    Harrietta Guardian, PharmD PGY1 Pharmacy Resident 11/09/2018    2:25 PM Please check AMION for all [place] numbers

## 2018-11-14 NOTE — Progress Notes (Signed)
Devon Mathews. 68 y.o. male DOB: May 17, 1951 MRN: [id number]      Nutrition Note  No diagnosis found. Past Medical History:  Diagnosis Date  . CAD (coronary artery disease)    Echo, EF =>55%, normal findings  . Chest pain 07/28/2016  . Chest pain, atypical 2012 & 2009   stress myo, EF 65% 2012/ stress myo, EF 65% 2009  . Dizziness 2012   normal findings..  . Essential hypertension 07/28/2016  . Lower extremity edema 07/28/2016  . Obesity   . Obesity (BMI 30-39.9) 07/28/2016  . OSA (obstructive sleep apnea) 07/28/2016  . Restless leg syndrome    mild  . Shortness of breath 07/28/2016   Meds reviewed.    Current Outpatient Medications (Cardiovascular):  .  atorvastatin (LIPITOR) 80 MG tablet, Take 1 tablet (80 mg total) by mouth daily at 6 PM. .  hydrochlorothiazide (MICROZIDE) 12.5 MG capsule, Take 1 capsule (12.5 mg total) by mouth daily. .  metoprolol succinate (TOPROL-XL) 50 MG 24 hr tablet, Take 1 tablet (50 mg total) by mouth daily. Take with or immediately following a meal. .  nitroGLYCERIN (NITROSTAT) 0.4 MG SL tablet, Place 1 tablet (0.4 mg total) under the tongue every 5 (five) minutes x 3 doses as needed for chest pain.   Current Outpatient Medications (Analgesics):  .  aspirin EC 81 MG tablet, Take 81 mg by mouth daily.  Current Outpatient Medications (Hematological):  .  ticagrelor (BRILINTA) 90 MG TABS tablet, Take 1 tablet (90 mg total) by mouth 2 (two) times daily.  Current Outpatient Medications (Other):  .  amitriptyline (ELAVIL) 25 MG tablet, Take 25 mg by mouth at bedtime as needed for sleep. .  pantoprazole (PROTONIX) 40 MG tablet, TAKE ONE TABLET BY MOUTH ONE TIME DAILY. must make follow up appointment for further refills   HT: Ht Readings from Last 1 Encounters:  10/12/18 5\' 9"  (1.753 m)    WT: Wt Readings from Last 5 Encounters:  10/12/18 269 lb (122 kg)  09/26/18 270 lb (122.5 kg)  09/14/18 279 lb 15.8 oz (127 kg)  08/21/18 286 lb 9.6 oz (130  kg)  08/15/17 279 lb 3.2 oz (126.6 kg)     BMI = 39.71  10/12/18   Current tobacco use? No  Labs:  Lipid Panel     Component Value Date/Time   CHOL 132 09/14/2018 0230   TRIG 118 09/14/2018 0230   HDL 39 (L) 09/14/2018 0230   CHOLHDL 3.4 09/14/2018 0230   VLDL 24 09/14/2018 0230   LDLCALC 69 09/14/2018 0230    Lab Results  Component Value Date   HGBA1C 5.2 09/14/2018   CBG (last 3)  No results for input(s): GLUCAP in the last 72 hours.  Nutrition Diagnosis ? Food-and nutrition-related knowledge deficit related to lack of exposure to information as related to diagnosis of: ? CVD  ? Obese  II = 35-39.9 related to excessive energy intake as evidenced by a BMI = 39.71  10/12/18   Nutrition Intervention ? Pt's individual nutrition plan and goals reviewed with pt.  Nutrition Goal(s):   To be determined  Plan:  ? Pt to attend nutrition classes ? Nutrition I ? Nutrition II ? Portion Distortion  ? Will provide client-centered nutrition education as part of interdisciplinary care ? Monitor and evaluate progress toward nutrition goal with team.   Laurina Bustle, MS, RD, LDN 11/14/2018 8:44 AM

## 2018-11-15 ENCOUNTER — Telehealth (HOSPITAL_COMMUNITY): Payer: Self-pay | Admitting: Cardiac Rehabilitation

## 2018-11-15 ENCOUNTER — Telehealth: Payer: Self-pay | Admitting: *Deleted

## 2018-11-15 NOTE — Telephone Encounter (Signed)
Received call from [facility] with cardiac rehab regarding phone conversation with patient. Called and spoke with patient and he has been having chest pain with exertion, relieved with rest. He has had on multiple occasions since last office visit.  Patient denies it being like when he had his MI. Scheduled appointment with Lurena Joiner PA 11/21/18 and advised to wait until after visit to start cardiac rehab. Discussed with Janan Ridge PA and he agreed.

## 2018-11-15 NOTE — Telephone Encounter (Signed)
Phone conversation with pt for CRPII RN interview. Pt c/o chest discomfort on exertion. Pt describes as burning and tightness that occurs when he walks and relieved with rest. Pt states he has noticed it more since he has gone back to work.  Spoke to [place] with Dr. Oval [name] who scheduled pt to be seen by Kerin Ransom, PA Tuesday 11/15/2018 @4pm .  Rip Harbour will call pt to discuss. Orientation will be put on hold for further evaluation.  Andi Hence, RN, BSN Cardiac Pulmonary Rehab .date 12:21 PM

## 2018-11-16 ENCOUNTER — Inpatient Hospital Stay (HOSPITAL_COMMUNITY)
Admission: RE | Admit: 2018-11-16 | Discharge: 2018-11-16 | Disposition: A | Discharge status: Home / Self Care | Payer: BLUE CROSS/BLUE SHIELD | Source: Ambulatory Visit

## 2018-11-18 HISTORY — PX: NM MYOVIEW LTD: HXRAD82

## 2018-11-20 ENCOUNTER — Ambulatory Visit (HOSPITAL_COMMUNITY): Payer: BLUE CROSS/BLUE SHIELD

## 2018-11-21 ENCOUNTER — Ambulatory Visit: Payer: BLUE CROSS/BLUE SHIELD | Admitting: Cardiology

## 2018-11-21 ENCOUNTER — Encounter: Payer: Self-pay | Admitting: Cardiology

## 2018-11-21 VITALS — BP 130/72 | HR 74 | Ht 69.0 in | Wt 266.0 lb

## 2018-11-21 DIAGNOSIS — R079 Chest pain, unspecified: Secondary | ICD-10-CM

## 2018-11-21 NOTE — Assessment & Plan Note (Signed)
Pt was to start cardiac rehab but told the RN he had been having mid sternal chest pain with exertion

## 2018-11-21 NOTE — Patient Instructions (Signed)
Medication Instructions:  Your physician recommends that you continue on your current medications as directed. Please refer to the Current Medication list given to you today. If you need a refill on your cardiac medications before your next appointment, please call your pharmacy.   Lab work: None  If you have labs (blood work) drawn today and your tests are completely normal, you will receive your results only by: Marland Kitchen MyChart Message (if you have MyChart) OR . A paper copy in the mail If you have any lab test that is abnormal or we need to change your treatment, we will call you to review the results.  Testing/Procedures: Your physician has requested that you have an exercise stress myoview. For further information please visit [email or web address]. Please follow instruction sheet, as given. PATIENT WILL BE A 2 DAY STUDY HOLD YOUR METOPROLOL ON THE MORNING OF TEST  Follow-Up: At [facility], you and your health needs are our priority.  As part of our continuing mission to provide you with exceptional heart care, we have created designated Provider Care Teams.  These Care Teams include your primary Cardiologist (physician) and Advanced Practice Providers (APPs -  Physician Assistants and Nurse Practitioners) who all work together to provide you with the care you need, when you need it.  Your physician recommends that you schedule a follow-up appointment in: 3-4 [place], PA-C  Any Other Special Instructions Will Be Listed Below (If Applicable).

## 2018-11-21 NOTE — Progress Notes (Signed)
11/21/2018 Devon Mathews   1951/10/13  [id number]  Primary Physician Tisovec, [name], MD Primary Cardiologist: Dr Oval [name]  HPI:  Pt is a pleasant 68 year old male who is now followed by Dr. Oval [name].  His father, Devon Mathews, Devon Mathews, was a longtime patient of Dr. Lowella Fairy.  The patient -Devon, Mathews had a heart catheterization in 2003 that showed a 40% LAD lesion.  Relook cath in 2006 again showed a 40% LAD lesion.  He had several Myoview's following this, the last one was in 2017, they were all low risk.  Echocardiogram in October 2017 showed good LV function with mild LVH.  Other medical problems include a family history of coronary disease, HTN, obesity, dyslipidemia, and sleep apnea on C-pap  The patient presented to [facility] 09/13/2018 with chest pain consistent with unstable angina.  He ruled in for a non-STEMI with a troponin peak of 0.15.  He underwent diagnostic catheterization and LAD stenting on 09/14/2018.  He had 20% left main narrowing but no other significant disease.  His LV function was preserved.  He was started on lipid therapy, low-dose diuretic, and his beta blocker was increased.  I saw him 09/26/18 and Dr Oval [name] saw him 10/12/18.   He was to start cardiac Rehab but when he was speaking with the RN on the phone and she suggested this appointment.  The patient says he has had mid sternal "gripping" chest pain with exertion.. but not always with exertion.  It is relieved with rest. It is different in quality than his MI symptoms-"like a [place] kicked me in the chest then sat on me".  He also complained of cramping in his Rt leg "from Brilinta".     Current Outpatient Medications  Medication Sig Dispense Refill  . amitriptyline (ELAVIL) 25 MG tablet Take 25 mg by mouth at bedtime as needed for sleep.    Marland Kitchen aspirin EC 81 MG tablet Take 81 mg by mouth daily.    Marland Kitchen atorvastatin (LIPITOR) 80 MG tablet Take 1 tablet (80 mg total) by mouth daily at 6 PM. 30 tablet 11  .  hydrochlorothiazide (MICROZIDE) 12.5 MG capsule Take 1 capsule (12.5 mg total) by mouth daily. 30 capsule 11  . metoprolol succinate (TOPROL-XL) 50 MG 24 hr tablet Take 1 tablet (50 mg total) by mouth daily. Take with or immediately following a meal. 30 tablet 11  . nitroGLYCERIN (NITROSTAT) 0.4 MG SL tablet Place 1 tablet (0.4 mg total) under the tongue every 5 (five) minutes x 3 doses as needed for chest pain. 25 tablet 11  . pantoprazole (PROTONIX) 40 MG tablet TAKE ONE TABLET BY MOUTH ONE TIME DAILY. must make follow up appointment for further refills 15 tablet 0  . ticagrelor (BRILINTA) 90 MG TABS tablet Take 1 tablet (90 mg total) by mouth 2 (two) times daily. 60 tablet 0   No current facility-administered medications for this visit.     Allergies  Allergen Reactions  . Penicillins Other (See Comments) and Hives    ALL OF THE ABOVE     Past Medical History:  Diagnosis Date  . CAD (coronary artery disease)    Echo, EF =>55%, normal findings  . Chest pain 07/28/2016  . Chest pain, atypical 2012 & 2009   stress myo, EF 65% 2012/ stress myo, EF 65% 2009  . Dizziness 2012   normal findings..  . Essential hypertension 07/28/2016  . Lower extremity edema 07/28/2016  . Obesity   . Obesity (BMI  30-39.9) 07/28/2016  . OSA (obstructive sleep apnea) 07/28/2016  . Restless leg syndrome    mild  . Shortness of breath 07/28/2016    Social History   Socioeconomic History  . Marital status: Married    Spouse name: Not on file  . Number of children: Not on file  . Years of education: Not on file  . Highest education level: Not on file  Occupational History  . Not on file  Social Needs  . Financial resource strain: Not on file  . Food insecurity:    Worry: Not on file    Inability: Not on file  . Transportation needs:    Medical: Not on file    Non-medical: Not on file  Tobacco Use  . Smoking status: Never Smoker  . Smokeless tobacco: Never Used  Substance and Sexual  Activity  . Alcohol use: No  . Drug use: No  . Sexual activity: Not on file  Lifestyle  . Physical activity:    Days per week: Not on file    Minutes per session: Not on file  . Stress: Not on file  Relationships  . Social connections:    Talks on phone: Not on file    Gets together: Not on file    Attends religious service: Not on file    Active member of club or organization: Not on file    Attends meetings of clubs or organizations: Not on file    Relationship status: Not on file  . Intimate partner violence:    Fear of current or ex partner: Not on file    Emotionally abused: Not on file    Physically abused: Not on file    Forced sexual activity: Not on file  Other Topics Concern  . Not on file  Social History Narrative  . Not on file     Family History  Problem Relation Age of Onset  . Heart failure Mother   . Other Father        bypass surgery  . Heart disease Father   . Heart failure Father   . COPD Father   . Heart attack Sister   . Diabetes Sister   . Heart failure Sister   . Diabetes Brother   . Other Maternal Grandfather        smoking related disease  . Heart attack Paternal Grandmother      Review of Systems: General: negative for chills, fever, night sweats or weight changes.  Cardiovascular: negative for chest pain, dyspnea on exertion, edema, orthopnea, palpitations, paroxysmal nocturnal dyspnea or shortness of breath Dermatological: negative for rash Respiratory: negative for cough or wheezing Urologic: negative for hematuria Abdominal: negative for nausea, vomiting, diarrhea, bright red blood per rectum, melena, or hematemesis Neurologic: negative for visual changes, syncope, or dizziness Leg cramping All other systems reviewed and are otherwise negative except as noted above.    Blood pressure 130/72, pulse 74, height 5\' 9"  (1.753 m), weight 266 lb (120.7 kg), SpO2 96 %.  General appearance: alert, cooperative, no distress and morbidly  obese Lungs: clear to auscultation bilaterally Heart: regular rate and rhythm Extremities: no edema Skin: Skin color, texture, turgor normal. No rashes or lesions Neurologic: Grossly normal  EKG- [id number]  ASSESSMENT AND PLAN:   Chest pain- moderate risk Pt was to start cardiac rehab but told the RN he had been having mid sternal chest pain with exertion  NSTEMI (non-ST elevated myocardial infarction) [facility]) The patient presented 09/13/2018 with a non-ST  elevation MI, troponin peak was 0.15  CAD S/P percutaneous coronary angioplasty LAD PCI with DES 09/14/2018  OSA (obstructive sleep apnea) Compliant with CPAP  Dyslipidemia LDL was 50 on Lab work from PCP 10/27/2018  Obesity (BMI 30-39.9) He has lost 10 pounds from his preadmission weight.  He admits his diet was pretty poor before he went in the hospital and this is been somewhat of a wake-up call for him.  PLAN  Pt's main complaint is SSCP and cramping in his legs. He chest pain is exertional but not reproducible and different in quality than his MI pain. Check GXT Myoview- if negative reassurance and he will be cleared to start cardiac rehab.  I told him I did not think his leg cramping was from Brilinta and explained he cannot stop this. I told him cramping is usually secondary to new muscle activity-not medications.     On 09/18/2018 we received a preop clearance request from [facility] imaging for an arthrogram CT study.  The patient is not cleared to stop his antiplatelet therapy for this.  He has been made aware.  Jaece Ducharme PA-C 11/21/2018 2:27 PM

## 2018-11-22 ENCOUNTER — Ambulatory Visit (HOSPITAL_COMMUNITY): Payer: BLUE CROSS/BLUE SHIELD

## 2018-11-23 ENCOUNTER — Ambulatory Visit: Payer: BLUE CROSS/BLUE SHIELD | Admitting: Cardiovascular Disease

## 2018-11-24 ENCOUNTER — Ambulatory Visit (HOSPITAL_COMMUNITY): Payer: BLUE CROSS/BLUE SHIELD

## 2018-11-27 ENCOUNTER — Ambulatory Visit (HOSPITAL_COMMUNITY): Payer: BLUE CROSS/BLUE SHIELD

## 2018-11-28 ENCOUNTER — Telehealth: Payer: Self-pay | Admitting: Cardiology

## 2018-11-28 ENCOUNTER — Telehealth (HOSPITAL_COMMUNITY): Payer: Self-pay

## 2018-11-28 NOTE — Telephone Encounter (Signed)
Pt called to ask if okay to have a cortisone shot in his shoulder the morning of his Exercise Nuclear Ned stress test 11/30/18... after talking with the [place] they said there is no contraindication and if the pt is unable to hold on to the Treadmill for any reason they can give him Lexiscan to complete his test. Pt verbalized understanding and agreed.

## 2018-11-28 NOTE — Telephone Encounter (Signed)
Encounter complete. 

## 2018-11-28 NOTE — Telephone Encounter (Signed)
New message   PT is calling because he is scheduled Thursday 02/13 for a Myocardial perfusion and he is suppose to get a cordizone shot the same day at 10:15 and is wondering if this would interfere with the test  Please call

## 2018-11-29 ENCOUNTER — Ambulatory Visit (HOSPITAL_COMMUNITY): Payer: BLUE CROSS/BLUE SHIELD

## 2018-11-30 ENCOUNTER — Ambulatory Visit (HOSPITAL_COMMUNITY)
Admission: RE | Admit: 2018-11-30 | Discharge: 2018-11-30 | Disposition: A | Discharge status: Home / Self Care | Payer: BLUE CROSS/BLUE SHIELD | Source: Ambulatory Visit | Attending: Cardiology | Admitting: Cardiology

## 2018-11-30 DIAGNOSIS — M7541 Impingement syndrome of right shoulder: Secondary | ICD-10-CM | POA: Diagnosis not present

## 2018-11-30 DIAGNOSIS — R079 Chest pain, unspecified: Secondary | ICD-10-CM | POA: Diagnosis not present

## 2018-11-30 MED ORDER — TECHNETIUM TC 99M TETROFOSMIN IV KIT
28.0000 | PACK | Freq: Once | INTRAVENOUS | Status: AC | PRN
  Administered 2018-11-30: 28 via INTRAVENOUS
  Filled 2018-11-30: qty 28

## 2018-12-01 ENCOUNTER — Ambulatory Visit (HOSPITAL_COMMUNITY): Payer: BLUE CROSS/BLUE SHIELD

## 2018-12-01 ENCOUNTER — Ambulatory Visit (HOSPITAL_COMMUNITY)
Admission: RE | Admit: 2018-12-01 | Discharge: 2018-12-01 | Disposition: A | Discharge status: Home / Self Care | Payer: BLUE CROSS/BLUE SHIELD | Source: Ambulatory Visit | Attending: Cardiology | Admitting: Cardiology

## 2018-12-01 ENCOUNTER — Encounter (HOSPITAL_COMMUNITY): Payer: Self-pay | Admitting: *Deleted

## 2018-12-01 ENCOUNTER — Ambulatory Visit (INDEPENDENT_AMBULATORY_CARE_PROVIDER_SITE_OTHER): Payer: BLUE CROSS/BLUE SHIELD | Admitting: Cardiology

## 2018-12-01 ENCOUNTER — Encounter: Payer: Self-pay | Admitting: Cardiology

## 2018-12-01 VITALS — BP 152/72 | HR 63 | Ht 69.0 in | Wt 267.4 lb

## 2018-12-01 DIAGNOSIS — Z9861 Coronary angioplasty status: Secondary | ICD-10-CM

## 2018-12-01 DIAGNOSIS — I25118 Atherosclerotic heart disease of native coronary artery with other forms of angina pectoris: Secondary | ICD-10-CM | POA: Diagnosis not present

## 2018-12-01 DIAGNOSIS — Z01818 Encounter for other preprocedural examination: Secondary | ICD-10-CM | POA: Diagnosis not present

## 2018-12-01 DIAGNOSIS — I2510 Atherosclerotic heart disease of native coronary artery without angina pectoris: Secondary | ICD-10-CM

## 2018-12-01 DIAGNOSIS — E785 Hyperlipidemia, unspecified: Secondary | ICD-10-CM

## 2018-12-01 DIAGNOSIS — I209 Angina pectoris, unspecified: Secondary | ICD-10-CM

## 2018-12-01 DIAGNOSIS — I214 Non-ST elevation (NSTEMI) myocardial infarction: Secondary | ICD-10-CM

## 2018-12-01 DIAGNOSIS — R9439 Abnormal result of other cardiovascular function study: Secondary | ICD-10-CM

## 2018-12-01 LAB — MYOCARDIAL PERFUSION IMAGING
Estimated workload: 7.2 METS
Exercise duration (min): 6 min
Exercise duration (sec): 34 s
LV dias vol: 123 mL (ref 62–150)
LV sys vol: 57 mL
MPHR: 154 {beats}/min
Peak HR: 134 {beats}/min
Percent HR: 87 %
RPE: 20
Rest HR: 72 {beats}/min
SDS: 6
SRS: 6
SSS: 12
TID: 0.87

## 2018-12-01 MED ORDER — TECHNETIUM TC 99M TETROFOSMIN IV KIT
31.8000 | PACK | Freq: Once | INTRAVENOUS | Status: AC | PRN
  Administered 2018-12-01: 31.8 via INTRAVENOUS

## 2018-12-01 NOTE — H&P (View-Only) (Signed)
PCP: Haywood Pao, MD  Cardiologist: Dr. Oval [name] ;; Kerin Ransom, [place]  Clinic Note: Chief Complaint  Patient presents with  . Follow-up    Work in visit to discuss results of [place]  . Coronary Artery Disease    Recent non-STEMI with LAD PCI  . Chest Pain    Here for Myoview    HPI: Devon Mathews. is a 68 y.o. male with CAD-LAD PCI (NSTEMI) who presents today for work in visit to discuss abnormal MYOVIEW STRESS TEST. Former patient of Dr. Rollene Fare - (Cath 2003 & 2006 40% LAD, Low Risk Myoview in 2017) --> non-STEMI with LAD PCI in November 2019.  [name] was last seen on 11/21/2017 by Kerin Ransom for recurrent CP --> Myoview stress test ordered.  He is here today actually for the stress test with grossly abnormal EKG and images prior to being formally read.  Based on initial review, he was scheduled as a work in patient to see me today to discuss cardiac catheterization.  He had labs drawn prior to presenting.  Recent Hospitalizations: None since MI in November 2019  Studies Personally Reviewed - (if available, images/films reviewed: From Epic Chart or Care Everywhere)  TM Myoview (2-day study February 13-14, 2020)- 6:34 min.  7.2 METS -.  Chest pain / tightness -> horizontal ST segment depression of 2 mm noted in II, III, V5 and V6.  8 minutes into rest.  Large/ severe defect in the basal inferior basal inferolateral, mid inferior and mid inferolateral and apical inferior apical lateral/apical location.  Also large/severe defect in the basal-apical anterior and anterolateral wall.  Also medium-sized moderate defect in the basal inferoseptal and mid inferoseptal wall.  Findings are consistent with ischemia and prior microinfarction with peri-infarct ischemia.  HIGH RISK  Cath - PCI LAD 09/13/2018: 99% p-mLAD (BTW d1&d2) -> SYNERGY DES 3.5 X 12 (3.75 mm). 20% LM.  Cx, small RI & co-dom RCA normal.  EF 65%.     Echo October 2017: EF 60-65% with mild LVH.  Mild AI.  Trivial  MR and TR.  Myoview October 2017: NORMAL.  EF 55-65% 63%).  No ischemia or infarction.  LOW RISK  Interval History: [name] continues to note having exertional chest discomfort which is a more localized, sharp and less intense sensation and he had of the time of his MI.  He described his MI pain is being is a 5 [place] kicked him in the chest and then backed up and sat down.  His current symptoms are maybe 1-2 out of 10 as opposed to 10 out of 10 at that time.  He also notes exertional dyspnea.  He describes a sensation as a sharp gripping sensation.  Symptoms have not worsened since he saw [facility], but have remained persistent.  He has not really done much since last visit, but did note having a little bit worse than usual symptoms while trying to exercise on the treadmill stress test today.  Also notes mild cramping  No PND, orthopnea or edema.  No palpitations, lightheadedness, dizziness, weakness or syncope/near syncope. No TIA/amaurosis fugax symptoms. No melena, hematochezia, hematuria, or epstaxis. No claudication.  ROS: A comprehensive was performed. Review of Systems  Constitutional: Negative for malaise/fatigue (Not really fatigue, just lack of desire to try to do exercise because of his chest discomfort) and weight loss.  HENT: Negative for congestion and nosebleeds.   Gastrointestinal: Negative for blood in stool, heartburn and melena.  Genitourinary: Negative for  hematuria.  Musculoskeletal: Positive for joint pain. Negative for falls and myalgias.  Neurological: Negative for dizziness, focal weakness and weakness.  Psychiatric/Behavioral: Negative for depression and memory loss. The patient is not nervous/anxious and does not have insomnia.    I have reviewed and (if needed) personally updated the patient's problem list, medications, allergies, past medical and surgical history, social and family history.   Past Medical History:  Diagnosis Date  . CAD S/P percutaneous coronary  angioplasty 09/13/2018    99% p-mLAD (BTW d1&d2) -> SYNERGY DES 3.5 X 12 (3.75 mm). 20% LM.  Cx, small RI & co-dom RCA normal.  EF 65%.   . Dizziness 2012   normal findings..  . Essential hypertension 07/28/2016  . Lower extremity edema 07/28/2016  . NSTEMI (non-ST elevated myocardial infarction) ([place]) 09/13/2018   The patient presented 09/13/2018 with a non-ST elevation MI, troponin peak was 0.15  . Obesity   . Obesity (BMI 30-39.9) 07/28/2016  . OSA (obstructive sleep apnea) 07/28/2016  . Restless leg syndrome    mild  . Shortness of breath 07/28/2016    Past Surgical History:  Procedure Laterality Date  . CORONARY STENT INTERVENTION N/A 09/13/2018   Procedure: CORONARY STENT INTERVENTION;  Surgeon: Belva Crome, MD;  Location: [facility];  Service: Cardiovascular;; p-m LAD (btw D1&D2) - SYNERGY DES 3.5 x 12 (3.75 mm).  Marland Kitchen KNEE ARTHROSCOPY Right   . LEFT HEART CATH AND CORONARY ANGIOGRAPHY N/A 09/13/2018   Procedure: LEFT HEART CATH AND CORONARY ANGIOGRAPHY;  Surgeon: Belva Crome, MD;  Location: [facility];  Service: Cardiovascular;;  99% p-mLAD (BTW d1&d2) -> SYNERGY DES 3.5 X 12 (3.75 mm). 20% LM.  Cx, small RI & co-dom RCA normal.  EF 65%.   Marland Kitchen LEFT HEART CATH AND CORONARY ANGIOGRAPHY  2003   30-40% proximal segmental stenosis in the LAD  . LEFT HEART CATH AND CORONARY ANGIOGRAPHY  2006   EF >50% & no mitral regurgitation  . NM MYOVIEW LTD  07/2016   Normal.  EF 55-65% 63%).  No ischemia or infarction.  LOW RISK  . NM MYOVIEW LTD  11/2018   6: 34 min.  7.2 METS.  Noted chest pain and tightness.  Horizontal ST of T wave depression (2 mm) II, 3, V5 and V6 (HIGH RISK), large/severe defect basal-apical inferior, inferolateral wall.  Large-severe basal-apical anterior-anterolateral wall.  Medium/moderate defect in basal and mid inferoseptal wall.  Consistent with large prior infarct with peri-infarct ischemia.  HIGH RISK  . TRANSTHORACIC ECHOCARDIOGRAM  07/2016   EF  60-65% with mild LVH.  Mild AI.  Trivial MR and TR.    Current Meds  Medication Sig  . amitriptyline (ELAVIL) 25 MG tablet Take 25 mg by mouth at bedtime as needed for sleep.  Marland Kitchen aspirin EC 81 MG tablet Take 81 mg by mouth daily.  Marland Kitchen atorvastatin (LIPITOR) 80 MG tablet Take 1 tablet (80 mg total) by mouth daily at 6 PM.  . hydrochlorothiazide (MICROZIDE) 12.5 MG capsule Take 1 capsule (12.5 mg total) by mouth daily.  . metoprolol succinate (TOPROL-XL) 50 MG 24 hr tablet Take 1 tablet (50 mg total) by mouth daily. Take with or immediately following a meal.  . nitroGLYCERIN (NITROSTAT) 0.4 MG SL tablet Place 1 tablet (0.4 mg total) under the tongue every 5 (five) minutes x 3 doses as needed for chest pain.  . pantoprazole (PROTONIX) 40 MG tablet TAKE ONE TABLET BY MOUTH ONE TIME DAILY. must make follow  up appointment for further refills  . ticagrelor (BRILINTA) 90 MG TABS tablet Take 1 tablet (90 mg total) by mouth 2 (two) times daily.    Allergies  Allergen Reactions  . Penicillins Other (See Comments) and Hives    ALL OF THE ABOVE     Social History   Tobacco Use  . Smoking status: Never Smoker  . Smokeless tobacco: Never Used  Substance Use Topics  . Alcohol use: No  . Drug use: No   Social History   Social History Narrative  . Not on file    family history includes COPD in his father; Diabetes in his brother and sister; Heart attack in his paternal grandmother and sister; Heart disease in his father; Heart failure in his father, mother, and sister; Other in his father and maternal grandfather.  Wt Readings from Last 3 Encounters:  12/01/18 267 lb 6.4 oz (121.3 kg)  11/30/18 266 lb (120.7 kg)  11/21/18 266 lb (120.7 kg)    PHYSICAL EXAM BP (!) 152/72   Pulse 63   Ht 5\' 9"  (1.753 m)   Wt 267 lb 6.4 oz (121.3 kg)   BMI 39.49 kg/m  Physical Exam  Constitutional: He is oriented to person, place, and time. He appears well-developed and well-nourished. No distress.    Morbidly obese.  Well-groomed.  HENT:  Head: Normocephalic and atraumatic.  Mouth/Throat: Oropharynx is clear and moist.  Eyes: Pupils are equal, round, and reactive to light. Conjunctivae and EOM are normal. No scleral icterus.  Neck: Normal range of motion. Neck supple. No hepatojugular reflux and no JVD present. Carotid bruit is not present. No tracheal deviation present. No thyromegaly present.  Cardiovascular: Normal rate, regular rhythm and normal heart sounds.  No extrasystoles are present. PMI is not displaced (Difficult to palpate due to body habitus). Exam reveals decreased pulses (Mildly decreased pedal pulses). Exam reveals no gallop and no friction rub.  No murmur heard. Pulmonary/Chest: Effort normal and breath sounds normal. No respiratory distress. He has no wheezes. He has no rales.  Abdominal: Soft. Bowel sounds are normal. He exhibits no distension. There is no rebound.  Truncal obesity.  No HJR.  Musculoskeletal: Normal range of motion.        General: No edema.  Neurological: He is alert and oriented to person, place, and time. No cranial nerve deficit.  Psychiatric: He has a normal mood and affect. His behavior is normal. Judgment and thought content normal.  Vitals reviewed.   Adult ECG Report Not checked  Other studies Reviewed: Additional studies/ records that were reviewed today include:  Recent Labs:   Lab Results  Component Value Date   CHOL 132 09/14/2018   HDL 39 (L) 09/14/2018   LDLCALC 69 09/14/2018   TRIG 118 09/14/2018   CHOLHDL 3.4 09/14/2018   Checked today prior to this visit which was a work in visit to plan cath. Lab Results  Component Value Date   CREATININE 0.91 12/01/2018   BUN 14 12/01/2018   NA 138 12/01/2018   K 4.5 12/01/2018   CL 102 12/01/2018   CO2 21 12/01/2018    ASSESSMENT / PLAN: Problem List Items Addressed This Visit    Abnormal nuclear stress test    High risk nuclear stress test prior to formal read.  Formal read  agrees with my reading.  Both EKG and imaging are positive. 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  Symptoms were reproduced on treadmill with abnormal EKG and grossly abnormal imaging.  He had minimal troponin elevation at the time of his non-STEMI so the size of the infarct noted on the Myoview as well out of proportion to what would have been expected.  There is also multiple distribution involvement with inferior wall involvement as well which would suggest a possible secondary lesion.  There is no other lesions noted on his diagnostic cath.  With persistent symptoms, now grossly abnormal Myoview and recent PCI, will need relook cardiac catheterization with possible PCI.   Performing MD:  Glenetta Hew, M.D., M.S.  Procedure: Left heart catheterization with CORONARY ANGIOGRAPHY AND POSSIBLE PERCUTANEOUS CORONARY INTERVENTION  The procedure with Risks/Benefits/Alternatives and Indications was reviewed with the patient.  All questions were answered.    Risks / Complications include, but not limited to: Death, MI, CVA/TIA, VF/VT (with defibrillation), Bradycardia (need for temporary pacer placement), contrast induced nephropathy, bleeding / bruising / hematoma / pseudoaneurysm, vascular or coronary injury (with possible emergent CT or Vascular Surgery), adverse medication reactions, infection.  Additional risks involving the use of radiation with the possibility of radiation burns and cancer were explained in detail.  The patient voices understanding and agree to proceed.    Cath Lab Visit (complete for each Cath Lab visit)  Clinical Evaluation Leading to the Procedure:   ACS: No.  Non-ACS:    Anginal Classification: CCS II  Anti-ischemic medical therapy: Minimal Therapy (1 class of medications)  Non-Invasive Test  Results: High-risk stress test findings: cardiac mortality >3%/year  Prior CABG: No previous CABG        CAD S/P percutaneous coronary angioplasty (Chronic)    Status post LAD PCI a, and now with anterior as well as inferior abnormalities on stress test to evaluate recurrent chest pain. This close to recent PCI, relook cardiac catheterization is required.  Concern for downstream LAD disease but also another vessel being involved.      Relevant Orders   LEFT HEART CATHETERIZATION WITH CORONARY ANGIOGRAM   Coronary artery disease of native artery of native heart with stable angina pectoris (HCC) (Chronic)    Unexpectedly recurrent chest pain after relatively straightforward PCI.  There was a potential for some downstream stenosis that was not treated because of tortuosity of the vessel.  Now with a Myoview showing multivessel distribution abnormality and abnormal treadmill portion as well. Plan: Diagnostic catheterization with possible additional PCI.  Continue aspirin plus Brilinta.  On high-dose statin and beta-blocker.  If blood pressure continues to be elevated, would add amlodipine for antianginal effect if necessary versus ARB.      Relevant Orders   CBC (Completed)   Basic metabolic panel (Completed)   Hyperlipidemia LDL goal <70 (Chronic)    On high-dose statin.  Most recent labs show LDL of 69.  Continue high-dose for now given recurrence of symptoms.  Probably due for recheck in roughly 3 to 4 months.      NSTEMI (non-ST elevated myocardial infarction) (HCC) (Chronic)    Minimal troponin elevation at the time of his non-STEMI.  Very unusual to have a very sizable defect/infarct seen on Myoview with minimal troponin elevation.  This would mean that it be more consistent with probably rest ischemia.      Pre-op testing   Relevant Orders   LEFT HEART CATHETERIZATION WITH CORONARY ANGIOGRAM   CBC (Completed)   Basic  metabolic panel (Completed)      I spent a total  of 39minutes with the patient and chart review. >  50% of the time was spent in direct patient consultation.   Current medicines are reviewed at length with the patient today.  (+/- concerns) n/a The following changes have been made:  n/a  Patient Instructions  Medication Instructions:  NOT NEEDED If you need a refill on your cardiac medications before your next appointment, please call your pharmacy.   Lab work: BMP CBC If you have labs (blood work) drawn today and your tests are completely normal, you will receive your results only by: Marland Kitchen MyChart Message (if you have MyChart) OR . A paper copy in the mail If you have any lab test that is abnormal or we need to change your treatment, we will call you to review the results.  Testing/Procedures: SEE INSTRUCTION SHEET FOR DETAILS Your physician has requested that you have a cardiac catheterization. Cardiac catheterization is used to diagnose and/or treat various heart conditions. Doctors may recommend this procedure for a number of different reasons. The most common reason is to evaluate chest pain. Chest pain can be a symptom of coronary artery disease (CAD), and cardiac catheterization can show whether plaque is narrowing or blocking your heart's arteries. This procedure is also used to evaluate the valves, as well as measure the blood flow and oxygen levels in different parts of your heart. For further information please visit [facility]ugeFiesta[facility]tn. Please follow instruction sheet, as given.   Follow-Up: At [facility], you and your health needs are our priority.  As part of our continuing mission to provide you with exceptional heart care, we have created designated Provider Care Teams.  These Care Teams include your primary Cardiologist (physician) and Advanced Practice Providers (APPs -  Physician Assistants and Nurse Practitioners) who all work together to provide you with the care you need, when you need it.   Studies Ordered:    Orders Placed This Encounter  Procedures  . CBC  . Basic metabolic panel  . LEFT HEART CATHETERIZATION WITH CORONARY Illene Silver, M.D., M.S. Interventional Cardiologist   Pager # [phone] Phone # [phone] [address] [place], [place] [address]   Thank you for choosing Heartcare at [facility]!!

## 2018-12-01 NOTE — Patient Instructions (Signed)
Medication Instructions:  NOT NEEDED If you need a refill on your cardiac medications before your next appointment, please call your pharmacy.   Lab work: BMP CBC If you have labs (blood work) drawn today and your tests are completely normal, you will receive your results only by: Marland Kitchen MyChart Message (if you have MyChart) OR . A paper copy in the mail If you have any lab test that is abnormal or we need to change your treatment, we will call you to review the results.  Testing/Procedures: SEE INSTRUCTION SHEET FOR DETAILS Your physician has requested that you have a cardiac catheterization. Cardiac catheterization is used to diagnose and/or treat various heart conditions. Doctors may recommend this procedure for a number of different reasons. The most common reason is to evaluate chest pain. Chest pain can be a symptom of coronary artery disease (CAD), and cardiac catheterization can show whether plaque is narrowing or blocking your heart's arteries. This procedure is also used to evaluate the valves, as well as measure the blood flow and oxygen levels in different parts of your heart. For further information please visit HugeFiesta.[email or web address]. Please follow instruction sheet, as given.    Follow-Up: At [facility], you and your health needs are our priority.  As part of our continuing mission to provide you with exceptional heart care, we have created designated Provider Care Teams.  These Care Teams include your primary Cardiologist (physician) and Advanced Practice Providers (APPs -  Physician Assistants and Nurse Practitioners) who all work together to provide you with the care you need, when you need it.   Any Other Special Instructions Will Be Listed Below (If Applicable).      [place] [employer] [place] [address] Dept: [phone] Loc: [place].  12/01/2018  You are scheduled for a Cardiac Catheterization on Tuesday, February 18 with Dr. Glenetta Hew.  1. Please arrive at the [facility] (Main Entrance A) at [facility]: [address] [place], [place] [address] at 7:00 AM (This time is two hours before your procedure to ensure your preparation). Free valet parking service is available.   Special note: Every effort is made to have your procedure done on time. Please understand that emergencies sometimes delay scheduled procedures.  2. Diet: Do not eat solid foods after midnight.  The patient may have clear liquids until 5am upon the day of the procedure.  3. Labs: You will need to have blood drawn TODAY. You do not need to be fasting.  4. Medication instructions in preparation for your procedure:  DO NOT  TAKE MICROZIDE  THE MORNING OF TEST.  On the morning of your procedure, take your Aspirin  81 MGand Brilinta/Ticagrelor 90 MG and any morning medicines NOT listed above.  You may use sips of water.  5. Plan for one night stay--bring personal belongings. 6. Bring a current list of your medications and current insurance cards. 7. You MUST have a responsible person to drive you home. 8. Someone MUST be with you the first 24 hours after you arrive home or your discharge will be delayed. 9. Please wear clothes that are easy to get on and off and wear slip-on shoes.  Thank you for allowing [place] to care for you!   -- [place] [facility]

## 2018-12-01 NOTE — Progress Notes (Signed)
PCP: Haywood Pao, MD  Cardiologist: Dr. Oval [name] ;; Kerin Ransom, [place]  Clinic Note: Chief Complaint  Patient presents with  . Follow-up    Work in visit to discuss results of [place]  . Coronary Artery Disease    Recent non-STEMI with LAD PCI  . Chest Pain    Here for Myoview    HPI: Devon Mathews. is a 68 y.o. male with CAD-LAD PCI (NSTEMI) who presents today for work in visit to discuss abnormal MYOVIEW STRESS TEST. Former patient of Dr. Rollene Fare - (Cath 2003 & 2006 40% LAD, Low Risk Myoview in 2017) --> non-STEMI with LAD PCI in November 2019.  Devon Mathews. was last seen on 11/21/2017 by Kerin Ransom for recurrent CP --> Myoview stress test ordered.  He is here today actually for the stress test with grossly abnormal EKG and images prior to being formally read.  Based on initial review, he was scheduled as a work in patient to see me today to discuss cardiac catheterization.  He had labs drawn prior to presenting.  Recent Hospitalizations: None since MI in November 2019  Studies Personally Reviewed - (if available, images/films reviewed: From Epic Chart or Care Everywhere)  TM Myoview (2-day study February 13-14, 2020)- 6:34 min.  7.2 METS -.  Chest pain / tightness -> horizontal ST segment depression of 2 mm noted in II, III, V5 and V6.  8 minutes into rest.  Large/ severe defect in the basal inferior basal inferolateral, mid inferior and mid inferolateral and apical inferior apical lateral/apical location.  Also large/severe defect in the basal-apical anterior and anterolateral wall.  Also medium-sized moderate defect in the basal inferoseptal and mid inferoseptal wall.  Findings are consistent with ischemia and prior microinfarction with peri-infarct ischemia.  HIGH RISK  Cath - PCI LAD 09/13/2018: 99% p-mLAD (BTW d1&d2) -> SYNERGY DES 3.5 X 12 (3.75 mm). 20% LM.  Cx, small RI & co-dom RCA normal.  EF 65%.     Echo October 2017: EF 60-65% with mild LVH.  Mild AI.  Trivial  MR and TR.  Myoview October 2017: NORMAL.  EF 55-65% 63%).  No ischemia or infarction.  LOW RISK  Interval History: Devon Mathews continues to note having exertional chest discomfort which is a more localized, sharp and less intense sensation and he had of the time of his MI.  He described his MI pain is being is a 5 [place] kicked him in the chest and then backed up and sat down.  His current symptoms are maybe 1-2 out of 10 as opposed to 10 out of 10 at that time.  He also notes exertional dyspnea.  He describes a sensation as a sharp gripping sensation.  Symptoms have not worsened since he saw [facility], but have remained persistent.  He has not really done much since last visit, but did note having a little bit worse than usual symptoms while trying to exercise on the treadmill stress test today.  Also notes mild cramping  No PND, orthopnea or edema.  No palpitations, lightheadedness, dizziness, weakness or syncope/near syncope. No TIA/amaurosis fugax symptoms. No melena, hematochezia, hematuria, or epstaxis. No claudication.  ROS: A comprehensive was performed. Review of Systems  Constitutional: Negative for malaise/fatigue (Not really fatigue, just lack of desire to try to do exercise because of his chest discomfort) and weight loss.  HENT: Negative for congestion and nosebleeds.   Gastrointestinal: Negative for blood in stool, heartburn and melena.  Genitourinary: Negative for  hematuria.  Musculoskeletal: Positive for joint pain. Negative for falls and myalgias.  Neurological: Negative for dizziness, focal weakness and weakness.  Psychiatric/Behavioral: Negative for depression and memory loss. The patient is not nervous/anxious and does not have insomnia.    I have reviewed and (if needed) personally updated the patient's problem list, medications, allergies, past medical and surgical history, social and family history.   Past Medical History:  Diagnosis Date  . CAD S/P percutaneous coronary  angioplasty 09/13/2018    99% p-mLAD (BTW d1&d2) -> SYNERGY DES 3.5 X 12 (3.75 mm). 20% LM.  Cx, small RI & co-dom RCA normal.  EF 65%.   . Dizziness 2012   normal findings..  . Essential hypertension 07/28/2016  . Lower extremity edema 07/28/2016  . NSTEMI (non-ST elevated myocardial infarction) ([place]) 09/13/2018   The patient presented 09/13/2018 with a non-ST elevation MI, troponin peak was 0.15  . Obesity   . Obesity (BMI 30-39.9) 07/28/2016  . OSA (obstructive sleep apnea) 07/28/2016  . Restless leg syndrome    mild  . Shortness of breath 07/28/2016    Past Surgical History:  Procedure Laterality Date  . CORONARY STENT INTERVENTION N/A 09/13/2018   Procedure: CORONARY STENT INTERVENTION;  Surgeon: Belva Crome, MD;  Location: [facility];  Service: Cardiovascular;; p-m LAD (btw D1&D2) - SYNERGY DES 3.5 x 12 (3.75 mm).  Marland Kitchen KNEE ARTHROSCOPY Right   . LEFT HEART CATH AND CORONARY ANGIOGRAPHY N/A 09/13/2018   Procedure: LEFT HEART CATH AND CORONARY ANGIOGRAPHY;  Surgeon: Belva Crome, MD;  Location: [facility];  Service: Cardiovascular;;  99% p-mLAD (BTW d1&d2) -> SYNERGY DES 3.5 X 12 (3.75 mm). 20% LM.  Cx, small RI & co-dom RCA normal.  EF 65%.   Marland Kitchen LEFT HEART CATH AND CORONARY ANGIOGRAPHY  2003   30-40% proximal segmental stenosis in the LAD  . LEFT HEART CATH AND CORONARY ANGIOGRAPHY  2006   EF >50% & no mitral regurgitation  . NM MYOVIEW LTD  07/2016   Normal.  EF 55-65% 63%).  No ischemia or infarction.  LOW RISK  . NM MYOVIEW LTD  11/2018   6: 34 min.  7.2 METS.  Noted chest pain and tightness.  Horizontal ST of T wave depression (2 mm) II, 3, V5 and V6 (HIGH RISK), large/severe defect basal-apical inferior, inferolateral wall.  Large-severe basal-apical anterior-anterolateral wall.  Medium/moderate defect in basal and mid inferoseptal wall.  Consistent with large prior infarct with peri-infarct ischemia.  HIGH RISK  . TRANSTHORACIC ECHOCARDIOGRAM  07/2016   EF  60-65% with mild LVH.  Mild AI.  Trivial MR and TR.    Current Meds  Medication Sig  . amitriptyline (ELAVIL) 25 MG tablet Take 25 mg by mouth at bedtime as needed for sleep.  Marland Kitchen aspirin EC 81 MG tablet Take 81 mg by mouth daily.  Marland Kitchen atorvastatin (LIPITOR) 80 MG tablet Take 1 tablet (80 mg total) by mouth daily at 6 PM.  . hydrochlorothiazide (MICROZIDE) 12.5 MG capsule Take 1 capsule (12.5 mg total) by mouth daily.  . metoprolol succinate (TOPROL-XL) 50 MG 24 hr tablet Take 1 tablet (50 mg total) by mouth daily. Take with or immediately following a meal.  . nitroGLYCERIN (NITROSTAT) 0.4 MG SL tablet Place 1 tablet (0.4 mg total) under the tongue every 5 (five) minutes x 3 doses as needed for chest pain.  . pantoprazole (PROTONIX) 40 MG tablet TAKE ONE TABLET BY MOUTH ONE TIME DAILY. must make follow  up appointment for further refills  . ticagrelor (BRILINTA) 90 MG TABS tablet Take 1 tablet (90 mg total) by mouth 2 (two) times daily.    Allergies  Allergen Reactions  . Penicillins Other (See Comments) and Hives    ALL OF THE ABOVE     Social History   Tobacco Use  . Smoking status: Never Smoker  . Smokeless tobacco: Never Used  Substance Use Topics  . Alcohol use: No  . Drug use: No   Social History   Social History Narrative  . Not on file    family history includes COPD in his father; Diabetes in his brother and sister; Heart attack in his paternal grandmother and sister; Heart disease in his father; Heart failure in his father, mother, and sister; Other in his father and maternal grandfather.  Wt Readings from Last 3 Encounters:  12/01/18 267 lb 6.4 oz (121.3 kg)  11/30/18 266 lb (120.7 kg)  11/21/18 266 lb (120.7 kg)    PHYSICAL EXAM BP (!) 152/72   Pulse 63   Ht 5\' 9"  (1.753 m)   Wt 267 lb 6.4 oz (121.3 kg)   BMI 39.49 kg/m  Physical Exam  Constitutional: He is oriented to person, place, and time. He appears well-developed and well-nourished. No distress.    Morbidly obese.  Well-groomed.  HENT:  Head: Normocephalic and atraumatic.  Mouth/Throat: Oropharynx is clear and moist.  Eyes: Pupils are equal, round, and reactive to light. Conjunctivae and EOM are normal. No scleral icterus.  Neck: Normal range of motion. Neck supple. No hepatojugular reflux and no JVD present. Carotid bruit is not present. No tracheal deviation present. No thyromegaly present.  Cardiovascular: Normal rate, regular rhythm and normal heart sounds.  No extrasystoles are present. PMI is not displaced (Difficult to palpate due to body habitus). Exam reveals decreased pulses (Mildly decreased pedal pulses). Exam reveals no gallop and no friction rub.  No murmur heard. Pulmonary/Chest: Effort normal and breath sounds normal. No respiratory distress. He has no wheezes. He has no rales.  Abdominal: Soft. Bowel sounds are normal. He exhibits no distension. There is no rebound.  Truncal obesity.  No HJR.  Musculoskeletal: Normal range of motion.        General: No edema.  Neurological: He is alert and oriented to person, place, and time. No cranial nerve deficit.  Psychiatric: He has a normal mood and affect. His behavior is normal. Judgment and thought content normal.  Vitals reviewed.   Adult ECG Report Not checked  Other studies Reviewed: Additional studies/ records that were reviewed today include:  Recent Labs:   Lab Results  Component Value Date   CHOL 132 09/14/2018   HDL 39 (L) 09/14/2018   LDLCALC 69 09/14/2018   TRIG 118 09/14/2018   CHOLHDL 3.4 09/14/2018   Checked today prior to this visit which was a work in visit to plan cath. Lab Results  Component Value Date   CREATININE 0.91 12/01/2018   BUN 14 12/01/2018   NA 138 12/01/2018   K 4.5 12/01/2018   CL 102 12/01/2018   CO2 21 12/01/2018    ASSESSMENT / PLAN: Problem List Items Addressed This Visit    Abnormal nuclear stress test    High risk nuclear stress test prior to formal read.  Formal read  agrees with my reading.  Both EKG and imaging are positive. 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  Symptoms were reproduced on treadmill with abnormal EKG and grossly abnormal imaging.  He had minimal troponin elevation at the time of his non-STEMI so the size of the infarct noted on the Myoview as well out of proportion to what would have been expected.  There is also multiple distribution involvement with inferior wall involvement as well which would suggest a possible secondary lesion.  There is no other lesions noted on his diagnostic cath.  With persistent symptoms, now grossly abnormal Myoview and recent PCI, will need relook cardiac catheterization with possible PCI.   Performing MD:  Glenetta Hew, M.D., M.S.  Procedure: Left heart catheterization with CORONARY ANGIOGRAPHY AND POSSIBLE PERCUTANEOUS CORONARY INTERVENTION  The procedure with Risks/Benefits/Alternatives and Indications was reviewed with the patient.  All questions were answered.    Risks / Complications include, but not limited to: Death, MI, CVA/TIA, VF/VT (with defibrillation), Bradycardia (need for temporary pacer placement), contrast induced nephropathy, bleeding / bruising / hematoma / pseudoaneurysm, vascular or coronary injury (with possible emergent CT or Vascular Surgery), adverse medication reactions, infection.  Additional risks involving the use of radiation with the possibility of radiation burns and cancer were explained in detail.  The patient voices understanding and agree to proceed.    Cath Lab Visit (complete for each Cath Lab visit)  Clinical Evaluation Leading to the Procedure:   ACS: No.  Non-ACS:    Anginal Classification: CCS II  Anti-ischemic medical therapy: Minimal Therapy (1 class of medications)  Non-Invasive Test  Results: High-risk stress test findings: cardiac mortality >3%/year  Prior CABG: No previous CABG        CAD S/P percutaneous coronary angioplasty (Chronic)    Status post LAD PCI a, and now with anterior as well as inferior abnormalities on stress test to evaluate recurrent chest pain. This close to recent PCI, relook cardiac catheterization is required.  Concern for downstream LAD disease but also another vessel being involved.      Relevant Orders   LEFT HEART CATHETERIZATION WITH CORONARY ANGIOGRAM   Coronary artery disease of native artery of native heart with stable angina pectoris (HCC) (Chronic)    Unexpectedly recurrent chest pain after relatively straightforward PCI.  There was a potential for some downstream stenosis that was not treated because of tortuosity of the vessel.  Now with a Myoview showing multivessel distribution abnormality and abnormal treadmill portion as well. Plan: Diagnostic catheterization with possible additional PCI.  Continue aspirin plus Brilinta.  On high-dose statin and beta-blocker.  If blood pressure continues to be elevated, would add amlodipine for antianginal effect if necessary versus ARB.      Relevant Orders   CBC (Completed)   Basic metabolic panel (Completed)   Hyperlipidemia LDL goal <70 (Chronic)    On high-dose statin.  Most recent labs show LDL of 69.  Continue high-dose for now given recurrence of symptoms.  Probably due for recheck in roughly 3 to 4 months.      NSTEMI (non-ST elevated myocardial infarction) (HCC) (Chronic)    Minimal troponin elevation at the time of his non-STEMI.  Very unusual to have a very sizable defect/infarct seen on Myoview with minimal troponin elevation.  This would mean that it be more consistent with probably rest ischemia.      Pre-op testing   Relevant Orders   LEFT HEART CATHETERIZATION WITH CORONARY ANGIOGRAM   CBC (Completed)   Basic  metabolic panel (Completed)      I spent a total  of 20minutes with the patient and chart review. >  50% of the time was spent in direct patient consultation.   Current medicines are reviewed at length with the patient today.  (+/- concerns) n/a The following changes have been made:  n/a  Patient Instructions  Medication Instructions:  NOT NEEDED If you need a refill on your cardiac medications before your next appointment, please call your pharmacy.   Lab work: BMP CBC If you have labs (blood work) drawn today and your tests are completely normal, you will receive your results only by: Marland Kitchen MyChart Message (if you have MyChart) OR . A paper copy in the mail If you have any lab test that is abnormal or we need to change your treatment, we will call you to review the results.  Testing/Procedures: SEE INSTRUCTION SHEET FOR DETAILS Your physician has requested that you have a cardiac catheterization. Cardiac catheterization is used to diagnose and/or treat various heart conditions. Doctors may recommend this procedure for a number of different reasons. The most common reason is to evaluate chest pain. Chest pain can be a symptom of coronary artery disease (CAD), and cardiac catheterization can show whether plaque is narrowing or blocking your heart's arteries. This procedure is also used to evaluate the valves, as well as measure the blood flow and oxygen levels in different parts of your heart. For further information please visit [email or web address]. Please follow instruction sheet, as given.   Follow-Up: At [facility], you and your health needs are our priority.  As part of our continuing mission to provide you with exceptional heart care, we have created designated Provider Care Teams.  These Care Teams include your primary Cardiologist (physician) and Advanced Practice Providers (APPs -  Physician Assistants and Nurse Practitioners) who all work together to provide you with the care you need, when you need it.   Studies Ordered:    Orders Placed This Encounter  Procedures  . CBC  . Basic metabolic panel  . LEFT HEART CATHETERIZATION WITH CORONARY Illene Silver, M.D., M.S. Interventional Cardiologist   Pager # [phone] Phone # [phone] [address] [place], [place] [address]   Thank you for choosing Heart[facility] at [facility]!!

## 2018-12-01 NOTE — Progress Notes (Signed)
Dr [name] reviewed Myoview study. Pt will be seen by Dr [name] today (Dec 01, 2018) to discuss results of his Myoview study.

## 2018-12-02 ENCOUNTER — Encounter: Payer: Self-pay | Admitting: Cardiology

## 2018-12-02 DIAGNOSIS — R9439 Abnormal result of other cardiovascular function study: Secondary | ICD-10-CM | POA: Insufficient documentation

## 2018-12-02 DIAGNOSIS — Z01818 Encounter for other preprocedural examination: Secondary | ICD-10-CM | POA: Insufficient documentation

## 2018-12-02 LAB — CBC
Hematocrit: 43.6 % (ref 37.5–51.0)
Hemoglobin: 15 g/dL (ref 13.0–17.7)
MCH: 34.7 pg — AB (ref 26.6–33.0)
MCHC: 34.4 g/dL (ref 31.5–35.7)
MCV: 101 fL — ABNORMAL HIGH (ref 79–97)
Platelets: 214 10*3/uL (ref 150–450)
RBC: 4.32 x10E6/uL (ref 4.14–5.80)
RDW: 13.3 % (ref 11.6–15.4)
WBC: 8.5 10*3/uL (ref 3.4–10.8)

## 2018-12-02 LAB — BASIC METABOLIC PANEL
BUN/Creatinine Ratio: 15 (ref 10–24)
BUN: 14 mg/dL (ref 8–27)
CO2: 21 mmol/L (ref 20–29)
Calcium: 9.8 mg/dL (ref 8.6–10.2)
Chloride: 102 mmol/L (ref 96–106)
Creatinine, Ser: 0.91 mg/dL (ref 0.76–1.27)
GFR calc Af Amer: 101 mL/min/{1.73_m2} (ref >59)
GFR calc non Af Amer: 88 mL/min/{1.73_m2} (ref >59)
Glucose: 119 mg/dL — ABNORMAL HIGH (ref 65–99)
Potassium: 4.5 mmol/L (ref 3.5–5.2)
Sodium: 138 mmol/L (ref 134–144)

## 2018-12-02 NOTE — Assessment & Plan Note (Signed)
Status post LAD PCI a, and now with anterior as well as inferior abnormalities on stress test to evaluate recurrent chest pain. This close to recent PCI, relook cardiac catheterization is required.  Concern for downstream LAD disease but also another vessel being involved.

## 2018-12-02 NOTE — Assessment & Plan Note (Signed)
Unexpectedly recurrent chest pain after relatively straightforward PCI.  There was a potential for some downstream stenosis that was not treated because of tortuosity of the vessel.  Now with a Myoview showing multivessel distribution abnormality and abnormal treadmill portion as well. Plan: Diagnostic catheterization with possible additional PCI.  Continue aspirin plus Brilinta.  On high-dose statin and beta-blocker.  If blood pressure continues to be elevated, would add amlodipine for antianginal effect if necessary versus ARB.

## 2018-12-02 NOTE — Assessment & Plan Note (Signed)
On high-dose statin.  Most recent labs show LDL of 69.  Continue high-dose for now given recurrence of symptoms.  Probably due for recheck in roughly 3 to 4 months.

## 2018-12-02 NOTE — Assessment & Plan Note (Signed)
High risk nuclear stress test prior to formal read.  Formal read agrees with my reading.  Both EKG and imaging are positive. Plan: Cardiac catheterization

## 2018-12-02 NOTE — Assessment & Plan Note (Signed)
Still having exertional chest pain that relieves with rest.  Symptoms were reproduced on treadmill with abnormal EKG and grossly abnormal imaging.  He had minimal troponin elevation at the time of his non-STEMI so the size of the infarct noted on the Myoview as well out of proportion to what would have been expected.  There is also multiple distribution involvement with inferior wall involvement as well which would suggest a possible secondary lesion.  There is no other lesions noted on his diagnostic cath.  With persistent symptoms, now grossly abnormal Myoview and recent PCI, will need relook cardiac catheterization with possible PCI.   Performing MD:  Glenetta Hew, M.D., M.S.  Procedure: Left heart catheterization with CORONARY ANGIOGRAPHY AND POSSIBLE PERCUTANEOUS CORONARY INTERVENTION  The procedure with Risks/Benefits/Alternatives and Indications was reviewed with the patient.  All questions were answered.    Risks / Complications include, but not limited to: Death, MI, CVA/TIA, VF/VT (with defibrillation), Bradycardia (need for temporary pacer placement), contrast induced nephropathy, bleeding / bruising / hematoma / pseudoaneurysm, vascular or coronary injury (with possible emergent CT or Vascular Surgery), adverse medication reactions, infection.  Additional risks involving the use of radiation with the possibility of radiation burns and cancer were explained in detail.  The patient voices understanding and agree to proceed.    Cath Lab Visit (complete for each Cath Lab visit)  Clinical Evaluation Leading to the Procedure:   ACS: No.  Non-ACS:    Anginal Classification: CCS II  Anti-ischemic medical therapy: Minimal Therapy (1 class of medications)  Non-Invasive Test Results: High-risk stress test findings: cardiac mortality >3%/year  Prior CABG: No previous CABG

## 2018-12-02 NOTE — Assessment & Plan Note (Signed)
Minimal troponin elevation at the time of his non-STEMI.  Very unusual to have a very sizable defect/infarct seen on Myoview with minimal troponin elevation.  This would mean that it be more consistent with probably rest ischemia.

## 2018-12-04 ENCOUNTER — Ambulatory Visit (HOSPITAL_COMMUNITY): Payer: BLUE CROSS/BLUE SHIELD

## 2018-12-04 ENCOUNTER — Other Ambulatory Visit: Payer: Self-pay | Admitting: *Deleted

## 2018-12-04 ENCOUNTER — Telehealth: Payer: Self-pay | Admitting: *Deleted

## 2018-12-04 DIAGNOSIS — I25118 Atherosclerotic heart disease of native coronary artery with other forms of angina pectoris: Secondary | ICD-10-CM

## 2018-12-04 DIAGNOSIS — I209 Angina pectoris, unspecified: Secondary | ICD-10-CM

## 2018-12-04 DIAGNOSIS — Z01818 Encounter for other preprocedural examination: Secondary | ICD-10-CM

## 2018-12-04 DIAGNOSIS — I2510 Atherosclerotic heart disease of native coronary artery without angina pectoris: Secondary | ICD-10-CM

## 2018-12-04 DIAGNOSIS — R9439 Abnormal result of other cardiovascular function study: Secondary | ICD-10-CM

## 2018-12-04 NOTE — Telephone Encounter (Signed)
Pt contacted pre-catheterization scheduled at [facility] for: Tuesday December 05, 2018 9 AM Verified arrival time and place: [place] Entrance A at: 7 AM  No solid food after midnight prior to cath, clear liquids until 5 AM day of procedure. Contrast allergy: no Verified no diabetes medications.  Hold: HCTZ-AM of procedure.  Except hold medications AM meds can be  taken pre-cath with sip of water including: ASA 81 mg Brilinta 90 mg  Confirmed patient has responsible person to drive home post procedure and observe 24 hours after arriving home: yes

## 2018-12-05 ENCOUNTER — Other Ambulatory Visit: Payer: Self-pay

## 2018-12-05 ENCOUNTER — Ambulatory Visit (HOSPITAL_COMMUNITY): Payer: BLUE CROSS/BLUE SHIELD

## 2018-12-05 ENCOUNTER — Inpatient Hospital Stay (HOSPITAL_COMMUNITY): Payer: BLUE CROSS/BLUE SHIELD

## 2018-12-05 ENCOUNTER — Inpatient Hospital Stay (HOSPITAL_COMMUNITY)
Admission: AD | Admit: 2018-12-05 | Discharge: 2018-12-08 | DRG: 250 | Disposition: A | Discharge status: Home / Self Care | Payer: BLUE CROSS/BLUE SHIELD | Attending: Cardiology | Admitting: Cardiology

## 2018-12-05 ENCOUNTER — Encounter (HOSPITAL_COMMUNITY)
Admission: AD | Disposition: A | Discharge status: Home / Self Care | Payer: Self-pay | Source: Home / Self Care | Attending: Cardiology

## 2018-12-05 DIAGNOSIS — I309 Acute pericarditis, unspecified: Secondary | ICD-10-CM | POA: Diagnosis not present

## 2018-12-05 DIAGNOSIS — Z79899 Other long term (current) drug therapy: Secondary | ICD-10-CM | POA: Diagnosis not present

## 2018-12-05 DIAGNOSIS — Z7982 Long term (current) use of aspirin: Secondary | ICD-10-CM | POA: Diagnosis not present

## 2018-12-05 DIAGNOSIS — N179 Acute kidney failure, unspecified: Secondary | ICD-10-CM | POA: Diagnosis not present

## 2018-12-05 DIAGNOSIS — R570 Cardiogenic shock: Secondary | ICD-10-CM | POA: Diagnosis not present

## 2018-12-05 DIAGNOSIS — Z880 Allergy status to penicillin: Secondary | ICD-10-CM

## 2018-12-05 DIAGNOSIS — Z6838 Body mass index (BMI) 38.0-38.9, adult: Secondary | ICD-10-CM

## 2018-12-05 DIAGNOSIS — I97790 Other intraoperative cardiac functional disturbances during cardiac surgery: Secondary | ICD-10-CM | POA: Diagnosis not present

## 2018-12-05 DIAGNOSIS — I9789 Other postprocedural complications and disorders of the circulatory system, not elsewhere classified: Secondary | ICD-10-CM | POA: Diagnosis not present

## 2018-12-05 DIAGNOSIS — E669 Obesity, unspecified: Secondary | ICD-10-CM | POA: Diagnosis not present

## 2018-12-05 DIAGNOSIS — I25118 Atherosclerotic heart disease of native coronary artery with other forms of angina pectoris: Principal | ICD-10-CM | POA: Diagnosis present

## 2018-12-05 DIAGNOSIS — I314 Cardiac tamponade: Secondary | ICD-10-CM | POA: Diagnosis present

## 2018-12-05 DIAGNOSIS — I10 Essential (primary) hypertension: Secondary | ICD-10-CM | POA: Diagnosis not present

## 2018-12-05 DIAGNOSIS — I214 Non-ST elevation (NSTEMI) myocardial infarction: Secondary | ICD-10-CM

## 2018-12-05 DIAGNOSIS — Z01818 Encounter for other preprocedural examination: Secondary | ICD-10-CM

## 2018-12-05 DIAGNOSIS — I252 Old myocardial infarction: Secondary | ICD-10-CM | POA: Diagnosis not present

## 2018-12-05 DIAGNOSIS — I209 Angina pectoris, unspecified: Secondary | ICD-10-CM | POA: Diagnosis present

## 2018-12-05 DIAGNOSIS — E785 Hyperlipidemia, unspecified: Secondary | ICD-10-CM | POA: Diagnosis not present

## 2018-12-05 DIAGNOSIS — R9431 Abnormal electrocardiogram [ECG] [EKG]: Secondary | ICD-10-CM

## 2018-12-05 DIAGNOSIS — I313 Pericardial effusion (noninflammatory): Secondary | ICD-10-CM | POA: Diagnosis not present

## 2018-12-05 DIAGNOSIS — G4733 Obstructive sleep apnea (adult) (pediatric): Secondary | ICD-10-CM | POA: Diagnosis present

## 2018-12-05 DIAGNOSIS — G2581 Restless legs syndrome: Secondary | ICD-10-CM | POA: Diagnosis present

## 2018-12-05 DIAGNOSIS — R9439 Abnormal result of other cardiovascular function study: Secondary | ICD-10-CM

## 2018-12-05 DIAGNOSIS — I308 Other forms of acute pericarditis: Secondary | ICD-10-CM | POA: Diagnosis not present

## 2018-12-05 DIAGNOSIS — Z955 Presence of coronary angioplasty implant and graft: Secondary | ICD-10-CM | POA: Diagnosis not present

## 2018-12-05 DIAGNOSIS — I2510 Atherosclerotic heart disease of native coronary artery without angina pectoris: Secondary | ICD-10-CM | POA: Diagnosis not present

## 2018-12-05 DIAGNOSIS — I25110 Atherosclerotic heart disease of native coronary artery with unstable angina pectoris: Secondary | ICD-10-CM

## 2018-12-05 DIAGNOSIS — Z9861 Coronary angioplasty status: Secondary | ICD-10-CM | POA: Diagnosis not present

## 2018-12-05 DIAGNOSIS — Z8249 Family history of ischemic heart disease and other diseases of the circulatory system: Secondary | ICD-10-CM

## 2018-12-05 DIAGNOSIS — I219 Acute myocardial infarction, unspecified: Secondary | ICD-10-CM | POA: Diagnosis not present

## 2018-12-05 HISTORY — PX: CORONARY BALLOON ANGIOPLASTY: CATH118233

## 2018-12-05 HISTORY — PX: PERICARDIOCENTESIS: CATH118255

## 2018-12-05 HISTORY — PX: LEFT HEART CATH AND CORONARY ANGIOGRAPHY: CATH118249

## 2018-12-05 LAB — ECHOCARDIOGRAM LIMITED
Height: 69 in
Height: 69 in
WEIGHTICAEL: 4240 [oz_av]
Weight: 4240 oz

## 2018-12-05 LAB — POCT ACTIVATED CLOTTING TIME
Activated Clotting Time: 285 seconds
Activated Clotting Time: 318 seconds
Activated Clotting Time: 285 seconds
Activated Clotting Time: 98 seconds

## 2018-12-05 LAB — MRSA PCR SCREENING: MRSA by PCR: NEGATIVE

## 2018-12-05 LAB — TROPONIN I
Troponin I: 1.27 ng/mL (ref <0.03)
Troponin I: 0.89 ng/mL (ref <0.03)

## 2018-12-05 SURGERY — LEFT HEART CATH AND CORONARY ANGIOGRAPHY
Anesthesia: LOCAL

## 2018-12-05 MED ORDER — FENTANYL CITRATE (PF) 100 MCG/2ML IJ SOLN
INTRAMUSCULAR | Status: AC
  Filled 2018-12-05: qty 2

## 2018-12-05 MED ORDER — ATORVASTATIN CALCIUM 80 MG PO TABS
80.0000 mg | ORAL_TABLET | Freq: Every day | ORAL | Status: DC
  Administered 2018-12-05 – 2018-12-07 (×3): 80 mg via ORAL
  Filled 2018-12-05 (×3): qty 1

## 2018-12-05 MED ORDER — ONDANSETRON HCL 4 MG/2ML IJ SOLN
4.0000 mg | Freq: Four times a day (QID) | INTRAMUSCULAR | Status: DC | PRN
  Administered 2018-12-05 – 2018-12-07 (×2): 4 mg via INTRAVENOUS
  Filled 2018-12-05 (×2): qty 2

## 2018-12-05 MED ORDER — SODIUM CHLORIDE 0.9 % IV SOLN
INTRAVENOUS | Status: AC | PRN
  Administered 2018-12-05: 500 mL via INTRAVENOUS

## 2018-12-05 MED ORDER — LIDOCAINE HCL (PF) 1 % IJ SOLN
INTRAMUSCULAR | Status: AC
  Filled 2018-12-05: qty 30

## 2018-12-05 MED ORDER — NITROGLYCERIN 1 MG/10 ML FOR IR/CATH LAB
INTRA_ARTERIAL | Status: DC | PRN
  Administered 2018-12-05: 200 ug via INTRACORONARY

## 2018-12-05 MED ORDER — CHLORHEXIDINE GLUCONATE 0.12 % MT SOLN
15.0000 mL | Freq: Two times a day (BID) | OROMUCOSAL | Status: DC
  Administered 2018-12-05 – 2018-12-08 (×6): 15 mL via OROMUCOSAL
  Filled 2018-12-05 (×6): qty 15

## 2018-12-05 MED ORDER — NOREPINEPHRINE BITARTRATE 1 MG/ML IV SOLN
INTRAVENOUS | Status: AC | PRN
  Administered 2018-12-05: 20 ug/min via INTRAVENOUS

## 2018-12-05 MED ORDER — MIDAZOLAM HCL 2 MG/2ML IJ SOLN
INTRAMUSCULAR | Status: DC | PRN
  Administered 2018-12-05: 1.5 mg via INTRAVENOUS
  Administered 2018-12-05: 1 mg via INTRAVENOUS

## 2018-12-05 MED ORDER — NITROGLYCERIN 1 MG/10 ML FOR IR/CATH LAB
INTRA_ARTERIAL | Status: AC
  Filled 2018-12-05: qty 10

## 2018-12-05 MED ORDER — SODIUM CHLORIDE 0.9% FLUSH
3.0000 mL | Freq: Two times a day (BID) | INTRAVENOUS | Status: DC
  Administered 2018-12-05 – 2018-12-08 (×4): 3 mL via INTRAVENOUS

## 2018-12-05 MED ORDER — SODIUM CHLORIDE 0.9% FLUSH: 3.0000 mL | INTRAVENOUS | Status: DC | PRN

## 2018-12-05 MED ORDER — TICAGRELOR 90 MG PO TABS
90.0000 mg | ORAL_TABLET | Freq: Two times a day (BID) | ORAL | Status: DC
  Administered 2018-12-05 – 2018-12-08 (×6): 90 mg via ORAL
  Filled 2018-12-05 (×6): qty 1

## 2018-12-05 MED ORDER — HEPARIN SODIUM (PORCINE) 1000 UNIT/ML IJ SOLN
INTRAMUSCULAR | Status: DC | PRN
  Administered 2018-12-05 (×2): 6000 [IU] via INTRAVENOUS

## 2018-12-05 MED ORDER — PROTAMINE SULFATE 10 MG/ML IV SOLN
50.0000 mg | Freq: Once | INTRAVENOUS | Status: DC
  Filled 2018-12-05: qty 5

## 2018-12-05 MED ORDER — LABETALOL HCL 5 MG/ML IV SOLN: 10.0000 mg | INTRAVENOUS | Status: AC | PRN

## 2018-12-05 MED ORDER — PROTAMINE SULFATE 10 MG/ML IV SOLN
INTRAVENOUS | Status: DC | PRN
  Administered 2018-12-05: 50 mg via INTRAVENOUS

## 2018-12-05 MED ORDER — IOHEXOL 350 MG/ML SOLN
INTRAVENOUS | Status: DC | PRN
  Administered 2018-12-05: 220 mL via INTRACARDIAC

## 2018-12-05 MED ORDER — HYDRALAZINE HCL 20 MG/ML IJ SOLN: 5.0000 mg | INTRAMUSCULAR | Status: AC | PRN

## 2018-12-05 MED ORDER — METOPROLOL SUCCINATE ER 50 MG PO TB24
50.0000 mg | ORAL_TABLET | Freq: Every day | ORAL | Status: DC
  Administered 2018-12-06 – 2018-12-08 (×3): 50 mg via ORAL
  Filled 2018-12-05 (×3): qty 1

## 2018-12-05 MED ORDER — MIDAZOLAM HCL 2 MG/2ML IJ SOLN
INTRAMUSCULAR | Status: AC
  Filled 2018-12-05: qty 2

## 2018-12-05 MED ORDER — SODIUM CHLORIDE 0.9 % IV SOLN: 250.0000 mL | INTRAVENOUS | Status: DC | PRN

## 2018-12-05 MED ORDER — MORPHINE SULFATE (PF) 10 MG/ML IV SOLN
INTRAVENOUS | Status: DC | PRN
  Administered 2018-12-05: 2 mg via INTRAVENOUS
  Administered 2018-12-05: 4 mg via INTRAVENOUS
  Administered 2018-12-05 (×2): 2 mg via INTRAVENOUS

## 2018-12-05 MED ORDER — MORPHINE SULFATE (PF) 2 MG/ML IV SOLN
2.0000 mg | INTRAVENOUS | Status: DC | PRN
  Administered 2018-12-05 – 2018-12-07 (×17): 2 mg via INTRAVENOUS
  Filled 2018-12-05 (×17): qty 1

## 2018-12-05 MED ORDER — FENTANYL CITRATE (PF) 100 MCG/2ML IJ SOLN
INTRAMUSCULAR | Status: DC | PRN
  Administered 2018-12-05: 25 ug via INTRAVENOUS
  Administered 2018-12-05: 50 ug via INTRAVENOUS
  Administered 2018-12-05: 25 ug via INTRAVENOUS

## 2018-12-05 MED ORDER — HEPARIN (PORCINE) IN NACL 1000-0.9 UT/500ML-% IV SOLN
INTRAVENOUS | Status: AC
  Filled 2018-12-05: qty 1000

## 2018-12-05 MED ORDER — HEPARIN SODIUM (PORCINE) 1000 UNIT/ML IJ SOLN
INTRAMUSCULAR | Status: AC
  Filled 2018-12-05: qty 1

## 2018-12-05 MED ORDER — PANTOPRAZOLE SODIUM 40 MG PO TBEC
40.0000 mg | DELAYED_RELEASE_TABLET | Freq: Every day | ORAL | Status: DC
  Administered 2018-12-05 – 2018-12-07 (×3): 40 mg via ORAL
  Filled 2018-12-05 (×3): qty 1

## 2018-12-05 MED ORDER — COLCHICINE 0.6 MG PO TABS
0.6000 mg | ORAL_TABLET | Freq: Two times a day (BID) | ORAL | Status: DC
  Administered 2018-12-05 – 2018-12-08 (×6): 0.6 mg via ORAL
  Filled 2018-12-05 (×7): qty 1

## 2018-12-05 MED ORDER — ACETAMINOPHEN 325 MG PO TABS
650.0000 mg | ORAL_TABLET | ORAL | Status: DC | PRN
  Administered 2018-12-05 – 2018-12-07 (×2): 650 mg via ORAL
  Filled 2018-12-05 (×2): qty 2

## 2018-12-05 MED ORDER — VERAPAMIL HCL 2.5 MG/ML IV SOLN
INTRAVENOUS | Status: AC
  Filled 2018-12-05: qty 2

## 2018-12-05 MED ORDER — AMITRIPTYLINE HCL 25 MG PO TABS
25.0000 mg | ORAL_TABLET | Freq: Every evening | ORAL | Status: DC | PRN
  Filled 2018-12-05: qty 1

## 2018-12-05 MED ORDER — SODIUM CHLORIDE 0.9 % IV SOLN
INTRAVENOUS | Status: DC
  Administered 2018-12-05 (×2): via INTRAVENOUS

## 2018-12-05 MED ORDER — LIDOCAINE HCL (PF) 1 % IJ SOLN
INTRAMUSCULAR | Status: DC | PRN
  Administered 2018-12-05: 2 mL

## 2018-12-05 MED ORDER — ORAL CARE MOUTH RINSE: 15.0000 mL | Freq: Two times a day (BID) | OROMUCOSAL | Status: DC

## 2018-12-05 MED ORDER — SODIUM CHLORIDE 0.9 % IV SOLN: INTRAVENOUS | Status: AC

## 2018-12-05 MED ORDER — NOREPINEPHRINE 4 MG/250ML-% IV SOLN
2.0000 ug/min | INTRAVENOUS | Status: DC
  Administered 2018-12-05: 12 ug/min via INTRAVENOUS
  Filled 2018-12-05: qty 250

## 2018-12-05 MED ORDER — HEPARIN (PORCINE) IN NACL 1000-0.9 UT/500ML-% IV SOLN
INTRAVENOUS | Status: DC | PRN
  Administered 2018-12-05 (×2): 500 mL

## 2018-12-05 MED ORDER — ASPIRIN EC 81 MG PO TBEC
81.0000 mg | DELAYED_RELEASE_TABLET | Freq: Every day | ORAL | Status: DC
  Administered 2018-12-06 – 2018-12-08 (×3): 81 mg via ORAL
  Filled 2018-12-05 (×3): qty 1

## 2018-12-05 MED ORDER — PROTAMINE SULFATE 10 MG/ML IV SOLN
INTRAVENOUS | Status: AC
  Filled 2018-12-05: qty 5

## 2018-12-05 MED ORDER — VERAPAMIL HCL 2.5 MG/ML IV SOLN
INTRAVENOUS | Status: DC | PRN
  Administered 2018-12-05: 10 mL via INTRA_ARTERIAL

## 2018-12-05 MED ORDER — MORPHINE SULFATE (PF) 10 MG/ML IV SOLN
INTRAVENOUS | Status: AC
  Filled 2018-12-05: qty 1

## 2018-12-05 SURGICAL SUPPLY — 20 items
BALLN SAPPHIRE 2.0X12 (BALLOONS) ×2
BALLN WOLVERINE 2.50X10 (BALLOONS) ×2
BALLOON SAPPHIRE 2.0X12 (BALLOONS) IMPLANT
BALLOON WOLVERINE 2.50X10 (BALLOONS) IMPLANT
CATH INFINITI 5FR ANG PIGTAIL (CATHETERS) ×1 IMPLANT
CATH OPTITORQUE TIG 4.0 5F (CATHETERS) ×1 IMPLANT
CATH VISTA GUIDE 6FR XBLAD3.5 (CATHETERS) ×1 IMPLANT
DEVICE RAD COMP TR BAND LRG (VASCULAR PRODUCTS) ×1 IMPLANT
ELECT DEFIB PAD ADLT CADENCE (PAD) ×1 IMPLANT
GLIDESHEATH SLEND SS 6F .021 (SHEATH) ×1 IMPLANT
GUIDEWIRE INQWIRE 1.5J.035X260 (WIRE) IMPLANT
INQWIRE 1.5J .035X260CM (WIRE) ×2
KIT HEART LEFT (KITS) ×2 IMPLANT
PACK CARDIAC CATHETERIZATION (CUSTOM PROCEDURE TRAY) ×2 IMPLANT
PERIVAC PERICARDIOCENTESIS 8.3 (TRAY / TRAY PROCEDURE) ×1 IMPLANT
SHEATH PROBE COVER 6X72 (BAG) ×1 IMPLANT
TRANSDUCER W/STOPCOCK (MISCELLANEOUS) ×2 IMPLANT
TUBING CIL FLEX 10 FLL-RA (TUBING) ×2 IMPLANT
WIRE ASAHI SION 190X3X12 .014 (WIRE) ×1 IMPLANT
WIRE SION BLUE 180 (WIRE) ×1 IMPLANT

## 2018-12-05 NOTE — Progress Notes (Signed)
  Echocardiogram 2D Echocardiogram Limited has been performed.  Devon Mathews 12/05/2018, 2:19 PM

## 2018-12-05 NOTE — Op Note (Signed)
12/05/2018  12:34 PM  PATIENT:  Devon Mathews.  68 y.o. male with prior LAD stent from 2019 who really had recurrent chest pain and a grossly abnormal nuclear stress test.  He presented for cardiac catheterization and possible intervention.  PRE-OPERATIVE DIAGNOSIS:  Abnormal ST  POST-OPERATIVE DIAGNOSIS:   90% ostial diagonal (jailed) status post scoring balloon angioplasty reducing to 20% stenosis.  Procedure complication with interventional wire perforation of the distal diagonal branch with evidence of pericardial effusion and beginning of tamponade physiology with borderline cardiogenic shock requiring Levophed and IV fluid boluses  Protamine infusion given  Prolonged balloon inflations in the distal diagonal branch x3 led to occlusion of the distal vessel with no further blush.  Unsuccessful attempted pericardiocentesis  Dr. [name] from [place] surgery is aware, but as he is hemodynamically stable on Levophed, will monitor.   PROCEDURE:  Procedure(s): LEFT HEART CATH AND CORONARY ANGIOGRAPHY (N/A) CORONARY BALLOON ANGIOPLASTY (N/A)   Limited 2D echocardiogram  Attempted/unsuccessful pericardiocentesis (unable to perform either by Dr. [name] or Dr. Martinique due to body habitus)  SURGEON:  Surgeon(s) and Role:    * [name], MD - Primary    * [place], Peter M, [place], Jayadeep S, MD  PHYSICIAN ASSISTANT:   ANESTHESIA:   local and IV sedation; 2.5 mg Versed, 100 mcg fentanyl and 10 mg IV morphine  EBL: Minimal blood loss from procedure, extravasation into the pericardium noted  BLOOD ADMINISTERED:none  Equipment:: Access using ultrasound guidance on the right radial artery placing 6 French sheath.  5 [place] take 4.0 catheter for RCA angiography and LCA angiography. PTCA of diagonal: XB LAD 3.5 guide - SION Blue wire for LAD & SION Black for Diag --Initially angioplasty with 2.0 balloon followed by scoring balloon angioplasty 2.5 mm --Following  initial angioplasty, there was evidence of distal diagonal perforation from the wire. --- Multiple balloon inflations with a 2.0 mm balloon in the distal vessel (2 minutes x 2, 1 minute x 1, 15 minutes x 2 and 20 minutes x 1) --Protamine administered 50 mg x 10 minutes  MEDICATIONS USED:    SPECIMEN: None  TR band: 18 atm, 1230  DICTATION: .Note written in [place]:   Admit to inpatient -We will admit to ICU as he is still on Levophed drip.  Will monitor closely for signs of tamponade.  Dr. [name] aware.  Will hold off on antihypertensives until tomorrow.  We will check troponin levels as he quite likely has had a type IV myocardial infarction  Stat 2D echo upon arrival to CCU to confirm size of pericardial effusion seen during limited echo in the Cath Lab  Anticipate at least a 2-day stay for monitoring.  PATIENT DISPOSITION:  ICU - extubated and stable.   Delay start of Pharmacological VTE agent (>24hrs) due to surgical blood loss or risk of bleeding: not applicable    Glenetta Hew, M.D., M.S. Interventional Cardiologist   Pager # [phone] Phone # [phone] [address] [place], [place] [address]

## 2018-12-05 NOTE — Interval H&P Note (Signed)
History and Physical Interval Note:  12/05/2018 9:12 AM  Devon Mathews.  has presented today for surgery, with the diagnosis of worsening class II angina with abnormal nuclear stress test and known CAD history.    The various methods of treatment have been discussed with the patient and family. After consideration of risks, benefits and other options for treatment, the patient has consented to  Procedure(s): LEFT HEART CATH AND CORONARY ANGIOGRAPHY (N/A) with possible PERCUTANEOUS CORONARY INTERVENTION as a surgical intervention .    The patient's history has been reviewed, patient examined, no change in status, stable for surgery.  I have reviewed the patient's chart and labs.  Questions were answered to the patient's satisfaction.     Cath Lab Visit (complete for each Cath Lab visit)  Clinical Evaluation Leading to the Procedure:   ACS: No.  Non-ACS:    Anginal Classification: CCS II  Anti-ischemic medical therapy: Maximal Therapy (2 or more classes of medications)  Non-Invasive Test Results: High-risk stress test findings: cardiac mortality >3%/year  Prior CABG: No previous CABG  Glenetta Hew

## 2018-12-05 NOTE — Progress Notes (Signed)
  Echocardiogram 2D Echocardiogram Limited has been performed.  Devon Mathews 12/05/2018, 12:36 PM

## 2018-12-05 NOTE — Progress Notes (Signed)
Pt placed on cpap per MD request with 4L bled in. Pt wears nasal pillow at home but was ok to wear nasal mask at this time.

## 2018-12-05 NOTE — Progress Notes (Signed)
Patient wearing CPAP when RT entered room. RT switched mask from nasal mask to patient's home mask (nasal pillows). Patient tolerating CPAP well at this time. RT will monitor as needed.

## 2018-12-06 ENCOUNTER — Encounter (HOSPITAL_COMMUNITY): Payer: Self-pay

## 2018-12-06 ENCOUNTER — Inpatient Hospital Stay (HOSPITAL_COMMUNITY): Payer: BLUE CROSS/BLUE SHIELD

## 2018-12-06 ENCOUNTER — Ambulatory Visit (HOSPITAL_COMMUNITY): Payer: BLUE CROSS/BLUE SHIELD

## 2018-12-06 ENCOUNTER — Other Ambulatory Visit: Payer: Self-pay

## 2018-12-06 DIAGNOSIS — I209 Angina pectoris, unspecified: Secondary | ICD-10-CM

## 2018-12-06 DIAGNOSIS — I309 Acute pericarditis, unspecified: Secondary | ICD-10-CM

## 2018-12-06 DIAGNOSIS — I10 Essential (primary) hypertension: Secondary | ICD-10-CM

## 2018-12-06 DIAGNOSIS — E785 Hyperlipidemia, unspecified: Secondary | ICD-10-CM

## 2018-12-06 LAB — CBC
HCT: 45.9 % (ref 39.0–52.0)
Hemoglobin: 15.5 g/dL (ref 13.0–17.0)
MCH: 34.4 pg — ABNORMAL HIGH (ref 26.0–34.0)
MCHC: 33.8 g/dL (ref 30.0–36.0)
MCV: 102 fL — ABNORMAL HIGH (ref 80.0–100.0)
Platelets: 258 10*3/uL (ref 150–400)
RBC: 4.5 MIL/uL (ref 4.22–5.81)
RDW: 14.1 % (ref 11.5–15.5)
WBC: 14.5 10*3/uL — ABNORMAL HIGH (ref 4.0–10.5)
nRBC: 0 % (ref 0.0–0.2)

## 2018-12-06 LAB — BASIC METABOLIC PANEL
Anion gap: 13 (ref 5–15)
BUN: 32 mg/dL — ABNORMAL HIGH (ref 8–23)
CHLORIDE: 108 mmol/L (ref 98–111)
CO2: 15 mmol/L — ABNORMAL LOW (ref 22–32)
Calcium: 8.1 mg/dL — ABNORMAL LOW (ref 8.9–10.3)
Creatinine, Ser: 1.28 mg/dL — ABNORMAL HIGH (ref 0.61–1.24)
GFR calc Af Amer: >60 mL/min (ref >60)
GFR calc non Af Amer: 58 mL/min — ABNORMAL LOW (ref >60)
Glucose, Bld: 154 mg/dL — ABNORMAL HIGH (ref 70–99)
Potassium: 5.4 mmol/L — ABNORMAL HIGH (ref 3.5–5.1)
Sodium: 136 mmol/L (ref 135–145)

## 2018-12-06 LAB — ECHOCARDIOGRAM LIMITED
HEIGHTINCHES: 69 in
Weight: 4240 oz

## 2018-12-06 LAB — TROPONIN I: TROPONIN I: 1.18 ng/mL — AB (ref <0.03)

## 2018-12-06 MED ORDER — DEXAMETHASONE SODIUM PHOSPHATE 4 MG/ML IJ SOLN
4.0000 mg | Freq: Once | INTRAMUSCULAR | Status: AC
  Administered 2018-12-06: 4 mg via INTRAVENOUS
  Filled 2018-12-06: qty 1

## 2018-12-06 MED ORDER — HEPARIN SODIUM (PORCINE) 5000 UNIT/ML IJ SOLN
5000.0000 [IU] | Freq: Three times a day (TID) | INTRAMUSCULAR | Status: DC
  Administered 2018-12-06 – 2018-12-08 (×6): 5000 [IU] via SUBCUTANEOUS
  Filled 2018-12-06 (×6): qty 1

## 2018-12-06 NOTE — Progress Notes (Signed)
EKG CRITICAL VALUE  Critical/STEMI 7:01AM   12 lead EKG performed.  Critical value noted. Hamad,Alexander, RN notified.   Carollee Sires, CCT 12/06/2018 7:07 AM

## 2018-12-06 NOTE — Plan of Care (Signed)
  Problem: Education: Goal: Individualized Educational Video(s) Outcome: Progressing   Problem: Activity: Goal: Ability to return to baseline activity level will improve Outcome: Progressing   Problem: Cardiovascular: Goal: Ability to achieve and maintain adequate cardiovascular perfusion will improve Outcome: Progressing Goal: Vascular access site(s) Level 0-1 will be maintained Outcome: Progressing   Problem: Health Behavior/Discharge Planning: Goal: Ability to safely manage health-related needs after discharge will improve Outcome: Progressing

## 2018-12-06 NOTE — Progress Notes (Signed)
  Echocardiogram 2D Echocardiogram has been performed.  Devon Mathews 12/06/2018, 12:46 PM

## 2018-12-06 NOTE — Progress Notes (Addendum)
The patient has been seen in conjunction with Ina Homes, MD. All aspects of care have been considered and discussed. The patient has been personally interviewed, examined, and all clinical data has been reviewed.   Acute pericarditis secondary to intrapericardial hemorrhage following PCI related vessel perforation.  No clinical evidence of tamponade.  The effusion was small.  Currently having ongoing chest discomfort that is worse with deep breathing consistent with pericarditis.  1 dose of IV Decadron 4 mg.  Continue colchicine.  Clinical observation.  Echo will be repeated today to ensure that the pericardial effusion is not enlarging.  It is safe to give DVT prophylaxis at this point.    Progress Note  Patient Name: Devon Mathews. Date of Encounter: 12/06/2018  Primary Cardiologist: Skeet Latch, MD   Subjective   Patient is having some chest pressure this AM. Constant in nature but not severe. No change with respiratory. Discussed that his troponin did increase slightly overnight but this is not unexpected. Discussed plan for continued medical management.   Inpatient Medications    Scheduled Meds: . aspirin EC  81 mg Oral Daily  . atorvastatin  80 mg Oral q1800  . chlorhexidine  15 mL Mouth Rinse BID  . colchicine  0.6 mg Oral BID  . mouth rinse  15 mL Mouth Rinse q12n4p  . metoprolol succinate  50 mg Oral Daily  . pantoprazole  40 mg Oral QHS  . sodium chloride flush  3 mL Intravenous Q12H  . ticagrelor  90 mg Oral BID   Continuous Infusions: . sodium chloride    . norepinephrine (LEVOPHED) Adult infusion Stopped (12/05/18 1830)   PRN Meds: sodium chloride, acetaminophen, amitriptyline, morphine injection, ondansetron (ZOFRAN) IV, sodium chloride flush   Vital Signs    Vitals:   12/06/18 0600 12/06/18 0700 12/06/18 0800 12/06/18 0804  BP: (!) 146/89 (!) 152/90 (!) 156/84   Pulse: 83 85    Resp: 11 (!) 24 19   Temp:    98.3 F (36.8 C)  TempSrc:     Oral  SpO2: 91% (!) 89%  95%  Weight:      Height:        Intake/Output Summary (Last 24 hours) at 12/06/2018 0858 Last data filed at 12/06/2018 0800 Gross per 24 hour  Intake 1552.36 ml  Output 975 ml  Net 577.36 ml   Filed Weights   12/05/18 0718  Weight: 120.2 kg   Telemetry    NSR - Personally Reviewed  ECG    NSR with ST elevation in leads I, II, V5-V6.- Personally Reviewed  Physical Exam   Today's Vitals   12/06/18 0700 12/06/18 0717 12/06/18 0800 12/06/18 0804  BP: (!) 152/90  (!) 156/84   Pulse: 85     Resp: (!) 24  19   Temp:    98.3 F (36.8 C)  TempSrc:    Oral  SpO2: (!) 89%   95%  Weight:      Height:      PainSc:  4      Body mass index is 39.13 kg/m.  GEN: No acute distress.   Neck: No JVD Cardiac: RRR, no murmurs, rubs, or gallops.  Respiratory: Clear to auscultation bilaterally. GI: Soft, nontender, non-distended  MS: No edema; No deformity. Neuro:  Nonfocal  Psych: Normal affect   Labs    Chemistry Recent Labs  Lab 12/01/18 1554  NA 138  K 4.5  CL 102  CO2 21  GLUCOSE 119*  BUN 14  CREATININE 0.91  CALCIUM 9.8  GFRNONAA 88  GFRAA 101    Hematology Recent Labs  Lab 12/01/18 1554 12/06/18 0714  WBC 8.5 14.5*  RBC 4.32 4.50  HGB 15.0 15.5  HCT 43.6 45.9  MCV 101* 102.0*  MCH 34.7* 34.4*  MCHC 34.4 33.8  RDW 13.3 14.1  PLT 214 258   Cardiac Enzymes Recent Labs  Lab 12/05/18 1329 12/05/18 1903 12/06/18 0034  TROPONINI 1.27* 0.89* 1.18*   No results for input(s): TROPIPOC in the last 168 hours.   BNPNo results for input(s): BNP, PROBNP in the last 168 hours.   DDimer No results for input(s): DDIMER in the last 168 hours.   Radiology    No results found.  Cardiac Studies   Left Heart Cath 12/05/2018  Culprit lesion:   90% ostial 1st Diag (jailed) -successful scoring balloon angioplasty using 2.5 mm Wolverine scoring balloon  --Distal diagonal wire perforation with pericardial effusion and initial  stages of pericardial tamponade --> successful occlusion with prolonged balloon inflations and administration of protamine  --Unsuccessful pericardiocentesis  Widely patent LAD stent  Otherwise stable coronary arteries.  Normal LVEF and EDP pre-PCI  Clear ST elevations for roughly 15 minutes, most likely type for MI.  Patient Profile     68 y.o. male with know CAD who presented with unstable angina and an abnormal stress test. He was subsequently taken for PCI that was complicated by perforation.   Assessment & Plan    Unstable Angina  CAD  PCI complicated by perforation  Type IV MI - Continues to have chest pain. EKG with some new ST elevation. Troponins elevated. Likely Type 4 MI - Continue pain control with morphine and nitro  - Off levo this AM  - Continue ASA, Ticagrelor, Atorvastatin, Metoprolol.  - Continue colchicine  - Will need ACE/ARB prior to DC  Hypertension  - BP goal <130/80  - Continue metoprolol  - Will start ACE/ARB prior to discharge/   Will discuss the case further with Dr. Tamala Julian.  For questions or updates, please contact [place] Please consult [email or web address] for contact info under Cardiology/STEMI.   Signed, Ina Homes, MD  12/06/2018, 8:58 AM

## 2018-12-07 ENCOUNTER — Encounter (HOSPITAL_COMMUNITY): Payer: Self-pay

## 2018-12-07 DIAGNOSIS — I308 Other forms of acute pericarditis: Secondary | ICD-10-CM

## 2018-12-07 DIAGNOSIS — R9439 Abnormal result of other cardiovascular function study: Secondary | ICD-10-CM

## 2018-12-07 DIAGNOSIS — I219 Acute myocardial infarction, unspecified: Secondary | ICD-10-CM

## 2018-12-07 DIAGNOSIS — I2510 Atherosclerotic heart disease of native coronary artery without angina pectoris: Secondary | ICD-10-CM

## 2018-12-07 DIAGNOSIS — Z9861 Coronary angioplasty status: Secondary | ICD-10-CM

## 2018-12-07 LAB — CBC
HCT: 41 % (ref 39.0–52.0)
Hemoglobin: 13.8 g/dL (ref 13.0–17.0)
MCH: 34.2 pg — ABNORMAL HIGH (ref 26.0–34.0)
MCHC: 33.7 g/dL (ref 30.0–36.0)
MCV: 101.7 fL — AB (ref 80.0–100.0)
Platelets: 207 10*3/uL (ref 150–400)
RBC: 4.03 MIL/uL — ABNORMAL LOW (ref 4.22–5.81)
RDW: 13.7 % (ref 11.5–15.5)
WBC: 11.2 10*3/uL — ABNORMAL HIGH (ref 4.0–10.5)
nRBC: 0 % (ref 0.0–0.2)

## 2018-12-07 LAB — BASIC METABOLIC PANEL
Anion gap: 12 (ref 5–15)
BUN: 30 mg/dL — ABNORMAL HIGH (ref 8–23)
CO2: 16 mmol/L — ABNORMAL LOW (ref 22–32)
Calcium: 8.6 mg/dL — ABNORMAL LOW (ref 8.9–10.3)
Chloride: 106 mmol/L (ref 98–111)
Creatinine, Ser: 1.09 mg/dL (ref 0.61–1.24)
GFR calc Af Amer: >60 mL/min (ref >60)
GFR calc non Af Amer: >60 mL/min (ref >60)
Glucose, Bld: 163 mg/dL — ABNORMAL HIGH (ref 70–99)
Potassium: 4.9 mmol/L (ref 3.5–5.1)
Sodium: 134 mmol/L — ABNORMAL LOW (ref 135–145)

## 2018-12-07 MED ORDER — MAGNESIUM HYDROXIDE 400 MG/5ML PO SUSP
30.0000 mL | Freq: Every day | ORAL | Status: DC | PRN
  Administered 2018-12-07: 30 mL via ORAL
  Filled 2018-12-07: qty 30

## 2018-12-07 NOTE — Progress Notes (Signed)
RT came to put CPAP on patient. Patient is already on CPAP tolerating well.

## 2018-12-07 NOTE — Progress Notes (Signed)
CARDIAC REHAB PHASE I   PRE:  Rate/Rhythm: 85 SR with PACs    BP: sitting 153/70    SaO2: 93 RA  MODE:  Ambulation: 200 ft   POST:  Rate/Rhythm: 103 ST with PACs    BP: sitting 170/88     SaO2: 95 RA  Pt eager to walk. Increased SOB and fatigue with walking. Had to rest x1 and was quite exerted after walk. Endorsed mild CP/pressure after walk that resolved in about 1 min. RN giving meds and gave morphine, which made pt sleepy. Pt would benefit from more walking today plus being up in room. I would suggest d/c tomorrow (wife would feel better as well). I encouraged more walking today.  Discussed MI, PTCA, Brilinta, restrictions, diet, ex, NTG and CRPII with pt and wife. Good reception although pt was sleepy. He was in process of starting CRPII in [place]. I will put in another referral and update them. [place], ACSM 12/07/2018 10:59 AM

## 2018-12-07 NOTE — Progress Notes (Signed)
Progress Note  Patient Name: Devon Mathews. Date of Encounter: 12/07/2018  Primary Cardiologist: Skeet Latch, MD   Subjective   The patient feels much better this morning. Pleuritic pericarditis is essentially resolved. He slept well during the night. He has had no ischemic chest pain. Has not required nitroglycerin.  Inpatient Medications    Scheduled Meds: . aspirin EC  81 mg Oral Daily  . atorvastatin  80 mg Oral q1800  . chlorhexidine  15 mL Mouth Rinse BID  . colchicine  0.6 mg Oral BID  . heparin injection (subcutaneous)  5,000 Units Subcutaneous Q8H  . mouth rinse  15 mL Mouth Rinse q12n4p  . metoprolol succinate  50 mg Oral Daily  . pantoprazole  40 mg Oral QHS  . sodium chloride flush  3 mL Intravenous Q12H  . ticagrelor  90 mg Oral BID   Continuous Infusions: . sodium chloride    . norepinephrine (LEVOPHED) Adult infusion Stopped (12/05/18 1830)   PRN Meds: sodium chloride, acetaminophen, amitriptyline, morphine injection, ondansetron (ZOFRAN) IV, sodium chloride flush   Vital Signs    Vitals:   12/06/18 2300 12/07/18 0008 12/07/18 0049 12/07/18 0750  BP:   (!) 169/89 134/71  Pulse: 84 88 86 72  Resp: (!) 21 16 (!) 24 20  Temp:   99 F (37.2 C)   TempSrc:   Oral Oral  SpO2: 96% 94% 94% 94%  Weight:   121.4 kg   Height:   5\' 10"  (1.778 m)     Intake/Output Summary (Last 24 hours) at 12/07/2018 0908 Last data filed at 12/06/2018 2000 Gross per 24 hour  Intake 580 ml  Output 400 ml  Net 180 ml   Last 3 Weights 12/07/2018 12/05/2018 12/01/2018  Weight (lbs) 267 lb 11.2 oz 265 lb 267 lb 6.4 oz  Weight (kg) 121.428 kg 120.203 kg 121.292 kg      Telemetry    Normal sinus rhythm without atrial fibrillation. Some PACs have been noted increasingly. - Personally Reviewed  ECG    The most recent tracing performed at 7:23 AM on 12/06/2018 reveals classic diffuse ST elevation with PR segment depression consistent with epicardial Mitzi Hansen from pericarditis.  - Personally Reviewed  Physical Exam  Morbid obesity. Pink skin color. GEN: No acute distress.   Neck: No JVD Cardiac: RRR, no murmurs, rubs, or gallops.  Respiratory: Clear to auscultation bilaterally. GI: Soft, nontender, non-distended  MS: No edema; No deformity. Neuro:  Nonfocal  Psych: Normal affect   Labs    Chemistry Recent Labs  Lab 12/01/18 1554 12/06/18 1014  NA 138 136  K 4.5 5.4*  CL 102 108  CO2 21 15*  GLUCOSE 119* 154*  BUN 14 32*  CREATININE 0.91 1.28*  CALCIUM 9.8 8.1*  GFRNONAA 88 58*  GFRAA 101 >60  ANIONGAP  --  13     Hematology Recent Labs  Lab 12/01/18 1554 12/06/18 0714  WBC 8.5 14.5*  RBC 4.32 4.50  HGB 15.0 15.5  HCT 43.6 45.9  MCV 101* 102.0*  MCH 34.7* 34.4*  MCHC 34.4 33.8  RDW 13.3 14.1  PLT 214 258    Cardiac Enzymes Recent Labs  Lab 12/05/18 1329 12/05/18 1903 12/06/18 0034  TROPONINI 1.27* 0.89* 1.18*   No results for input(s): TROPIPOC in the last 168 hours.   BNPNo results for input(s): BNP, PROBNP in the last 168 hours.   DDimer No results for input(s): DDIMER in the last 168 hours.   Radiology  No results found.  Cardiac Studies   2-D Doppler echocardiogram/19/2020: IMPRESSIONS    1. The left ventricle has normal systolic function, with an ejection fraction of 55-60%. The cavity size was normal. Left ventricular diastolic parameters were normal.  2. Small pericardial effusion seen at the apex, lateral wall and anterior to RV.  3. The mitral valve is degenerative. Mild thickening of the mitral valve leaflet. Mild calcification of the mitral valve leaflet.  4. The aortic valve is tricuspid There is Moderate thickening of the aortic valve There is Mild calcification of the aortic valveAortic valve regurgitation is trivial by color flow Doppler.  I personally reviewed all imaging since the perforation, and the most recent echo does not demonstrate any significant change in size of the pericardial  effusion.  Patient Profile     68 y.o. male who was admitted with atypical angina for diagnostic catheterization. A small congealed diagonal branch was felt to be the possible source for the patient's pain. PCI was attempted and, care of by perforation of the distal diagonal. Post-procedure developed EKG evidence of pericarditis and also chest pain consistent with the same.of the significant problems include hyperlipidemia, obesity,obstructive sleep apnea, and essential hypertension.  Assessment & Plan    1. Coronary artery disease with diagonal microperforation occurring during PCI. Clinically stable without symptoms to suggest ischemia or infarction.plan to ambulate and follow clinical course. If laboratory data, EKG, are unremarkable. Could possibly be discharged later today. Needs to walk with phase I cardiac rehabilitation. 2. Hemorrhagic pericarditis with resolving chest pain complaints. Continue cold seen. 3. Acute kidney injury, stage III. Basic metabolic panel will be reevaluated today.  Checking CBC, basic metabolic panel, and EKG. Will have phase I cardiac rehabilitation ambulation. Decision concerning discharge will depend on the above.  For questions or updates, please contact [place] Please consult [email or web address] for contact info under        Signed, Sinclair Grooms, MD  12/07/2018, 9:08 AM

## 2018-12-08 ENCOUNTER — Ambulatory Visit (HOSPITAL_COMMUNITY): Payer: BLUE CROSS/BLUE SHIELD

## 2018-12-08 ENCOUNTER — Telehealth: Payer: Self-pay

## 2018-12-08 ENCOUNTER — Telehealth: Payer: Self-pay | Admitting: Cardiology

## 2018-12-08 DIAGNOSIS — G4733 Obstructive sleep apnea (adult) (pediatric): Secondary | ICD-10-CM

## 2018-12-08 DIAGNOSIS — I25118 Atherosclerotic heart disease of native coronary artery with other forms of angina pectoris: Principal | ICD-10-CM

## 2018-12-08 LAB — BASIC METABOLIC PANEL
Anion gap: 8 (ref 5–15)
BUN: 27 mg/dL — AB (ref 8–23)
CO2: 23 mmol/L (ref 22–32)
Calcium: 8.5 mg/dL — ABNORMAL LOW (ref 8.9–10.3)
Chloride: 104 mmol/L (ref 98–111)
Creatinine, Ser: 0.96 mg/dL (ref 0.61–1.24)
GFR calc Af Amer: >60 mL/min (ref >60)
GFR calc non Af Amer: >60 mL/min (ref >60)
Glucose, Bld: 147 mg/dL — ABNORMAL HIGH (ref 70–99)
Potassium: 4.2 mmol/L (ref 3.5–5.1)
Sodium: 135 mmol/L (ref 135–145)

## 2018-12-08 MED ORDER — COLCHICINE 0.6 MG PO TABS: 0.6000 mg | ORAL_TABLET | Freq: Two times a day (BID) | ORAL | 3 refills | Status: DC

## 2018-12-08 MED FILL — COLCHICINE 0.6 MG TABS: 0.6 | 30 days supply | Qty: 60 | Fill #0 | Status: TO

## 2018-12-08 NOTE — Telephone Encounter (Signed)
Patient is currently admitted

## 2018-12-08 NOTE — Telephone Encounter (Signed)
Patient currently admitted

## 2018-12-08 NOTE — Progress Notes (Signed)
CARDIAC REHAB PHASE I   PRE:  Rate/Rhythm: 83 SR    BP: sitting 145/75    SaO2: 94 RA  MODE:  Ambulation: 430 ft   POST:  Rate/Rhythm: 104 ST    BP: sitting 165/87     SaO2: 96 RA  Pt doing much better today. Able to walk at slow pace. He was able to talk and walk, no rest needed, not as exerted as yesterday. He endorses minimal chest burning at end of walk. VSS. Reviewed ed and activity. Will update CRPII. [id number]   [place], ACSM 12/08/2018 10:11 AM

## 2018-12-08 NOTE — Progress Notes (Signed)
Progress Note  Patient Name: Devon Mathews. Date of Encounter: 12/08/2018  Primary Cardiologist: Skeet Latch, MD   Subjective   Some limitation with ambulating early yesterday.  He ambulated the hall last evening and did quite well.  Still has some mild pericarditic type chest pain but this is improving.  Feels that pain control is adequate for discharge.  Inpatient Medications    Scheduled Meds: . aspirin EC  81 mg Oral Daily  . atorvastatin  80 mg Oral q1800  . chlorhexidine  15 mL Mouth Rinse BID  . colchicine  0.6 mg Oral BID  . heparin injection (subcutaneous)  5,000 Units Subcutaneous Q8H  . mouth rinse  15 mL Mouth Rinse q12n4p  . metoprolol succinate  50 mg Oral Daily  . pantoprazole  40 mg Oral QHS  . sodium chloride flush  3 mL Intravenous Q12H  . ticagrelor  90 mg Oral BID   Continuous Infusions: . sodium chloride    . norepinephrine (LEVOPHED) Adult infusion Stopped (12/05/18 1830)   PRN Meds: sodium chloride, acetaminophen, amitriptyline, magnesium hydroxide, morphine injection, ondansetron (ZOFRAN) IV, sodium chloride flush   Vital Signs    Vitals:   12/07/18 1714 12/07/18 2158 12/07/18 2250 12/08/18 0537  BP:  (!) 169/89  (!) 141/76  Pulse:  86 80 82  Resp:  (!) 24 20 18   Temp: 99.8 F (37.7 C) 99 F (37.2 C)  98.6 F (37 C)  TempSrc: Oral Oral  Oral  SpO2:  98% 99% 95%  Weight:    120.6 kg  Height:        Intake/Output Summary (Last 24 hours) at 12/08/2018 0749 Last data filed at 12/07/2018 2154 Gross per 24 hour  Intake 243 ml  Output -  Net 243 ml   Last 3 Weights 12/08/2018 12/07/2018 12/05/2018  Weight (lbs) 265 lb 12.8 oz 267 lb 11.2 oz 265 lb  Weight (kg) 120.566 kg 121.428 kg 120.203 kg      Telemetry    Normal sinus rhythm without atrial fibrillation. Some PACs have been noted increasingly. - Personally Reviewed  ECG    Performed at 5:43 AM on 12/08/2018 demonstrates diffuse ST elevation consistent with pericarditis.-  Personally Reviewed  Physical Exam  Morbid obesity. Pink skin color. GEN: No acute distress.   Neck:  Unable to identify jugular vein distention. Cardiac:  No pericardial rub is noted. Respiratory: Clear to auscultation bilaterally. GI: Soft, nontender, non-distended  MS: No edema; No deformity. Neuro:  Nonfocal  Psych: Normal affect   Labs    Chemistry Recent Labs  Lab 12/01/18 1554 12/06/18 1014 12/07/18 0932  NA 138 136 134*  K 4.5 5.4* 4.9  CL 102 108 106  CO2 21 15* 16*  GLUCOSE 119* 154* 163*  BUN 14 32* 30*  CREATININE 0.91 1.28* 1.09  CALCIUM 9.8 8.1* 8.6*  GFRNONAA 88 58* >60  GFRAA 101 >60 >60  ANIONGAP  --  13 12     Hematology Recent Labs  Lab 12/01/18 1554 12/06/18 0714 12/07/18 0932  WBC 8.5 14.5* 11.2*  RBC 4.32 4.50 4.03*  HGB 15.0 15.5 13.8  HCT 43.6 45.9 41.0  MCV 101* 102.0* 101.7*  MCH 34.7* 34.4* 34.2*  MCHC 34.4 33.8 33.7  RDW 13.3 14.1 13.7  PLT 214 258 207    Cardiac Enzymes Recent Labs  Lab 12/05/18 1329 12/05/18 1903 12/06/18 0034  TROPONINI 1.27* 0.89* 1.18*   No results for input(s): TROPIPOC in the last 168 hours.  BNPNo results for input(s): BNP, PROBNP in the last 168 hours.   DDimer No results for input(s): DDIMER in the last 168 hours.   Radiology    No results found.  Cardiac Studies   2-D Doppler echocardiogram 12/06/2018: IMPRESSIONS    1. The left ventricle has normal systolic function, with an ejection fraction of 55-60%. The cavity size was normal. Left ventricular diastolic parameters were normal.  2. Small pericardial effusion seen at the apex, lateral wall and anterior to RV.  3. The mitral valve is degenerative. Mild thickening of the mitral valve leaflet. Mild calcification of the mitral valve leaflet.  4. The aortic valve is tricuspid There is Moderate thickening of the aortic valve There is Mild calcification of the aortic valveAortic valve regurgitation is trivial by color flow Doppler.  I  personally reviewed all imaging since the perforation, and the most recent echo does not demonstrate any significant change in size of the pericardial effusion.  Patient Profile     68 y.o. male who was admitted with atypical angina for diagnostic catheterization. A small congealed diagonal branch was felt to be the possible source for the patient's pain. PCI was attempted and, care of by perforation of the distal diagonal. Post-procedure developed EKG evidence of pericarditis and also chest pain consistent with the same.of the significant problems include hyperlipidemia, obesity,obstructive sleep apnea, and essential hypertension.  Assessment & Plan    1. Coronary artery disease with diagonal microperforation: He is doing well with only mild residual chest discomfort.  The electrocardiogram today continues to show 1 to 2 mm of ST elevation diffusely, consistent with pericarditis. 2. Hemorrhagic pericarditis: The electrocardiogram is still consistent with epicardial injury.  No general evidence of worsening effusion or tamponade.  We will continue colchicine.  We will avoid nonsteroidal therapy because of recent stents.  If chest discomfort worsens, a tapering dose short-term corticosteroid therapy would be appropriate. 3. Acute kidney injury, stage III.  Kidney function is resolved back to near normal.  Most recent bicarbonate level was 16.  This will be reassessed prior to discharge.  He will ambulate again this morning.  If no difficulty, will discharge home on colchicine.  Continue dual antiplatelet therapy and other medication therapy as prior to this admission.  Will need a 1 week follow-up to reassure pericardial effusion is not worsening (limited 2D Doppler echocardiogram).  Needs transition of care follow-up in 7 to 10 days.  For questions or updates, please contact [place] Please consult [email or web address] for contact info under        Signed, Sinclair Grooms, MD  12/08/2018, 7:49  AM

## 2018-12-08 NOTE — Discharge Summary (Addendum)
The patient has been seen in conjunction with Vin Bhagat, PAC. All aspects of care have been considered and discussed. The patient has been personally interviewed, examined, and all clinical data has been reviewed.   PCI complicated by diagonal microperforation.  Subsequent hemorrhagic pericarditis now improving on anti-inflammatory therapy (colchicine).  Postprocedure pericardial effusion is small and has not changed over time.  Patient is been able to ambulate without anginal symptoms.  Still has mild pleuritic discomfort but significantly better.  Plan discharge today with 5 to 7-day follow-up with limited echo and office visit.  Continue aspirin and Brilinta because of LAD stent less than 6 months old.  Discharge Summary    Patient ID: Devon Mathews. MRN: [id number]; DOB: 1951/04/02  Admit date: 12/05/2018 Discharge date: 12/08/2018  Primary Care Provider: Haywood Pao, MD  Primary Cardiologist: Skeet Latch, MD   Discharge Diagnoses    Principal Problem:   Abnormal nuclear stress test  Hemorrhagic pericarditis  Pericardial effusion  Active Problems:   CAD S/P percutaneous coronary angioplasty   Angina, class II ([place])   Essential hypertension   OSA (obstructive sleep apnea)   Hyperlipidemia LDL goal <70   Coronary artery disease of native artery of native heart with stable angina pectoris ([place])   Coronary artery disease involving native coronary artery of native heart with unstable angina pectoris ([facility])   Allergies Allergies  Allergen Reactions  . Penicillins Hives and Nausea And Vomiting    Did it involve swelling of the face/tongue/throat, SOB, or low BP? No Did it involve sudden or severe rash/hives, skin peeling, or any reaction on the inside of your mouth or nose? No Did you need to seek medical attention at a hospital or doctor's office? No When did it last happen?30 + years If all above answers are "NO", may proceed with cephalosporin use.      Diagnostic Studies/Procedures    CORONARY BALLOON ANGIOPLASTY 12/05/18  LEFT HEART CATH AND CORONARY ANGIOGRAPHY  PERICARDIOCENTESIS  Conclusion     Previously placed Prox LAD drug eluting stent is widely patent.  Ost 1st Diag lesion is 95% stenosed.  Scoring balloon angioplasty was performed using a BALLOON WOLVERINE 2.50X10.  Post intervention, there is a 20% residual stenosis.  The left ventricular systolic function is normal. The left ventricular ejection fraction is 55-65% by visual estimate.  Successful occlusion of distal diagonal branch with prolonged balloon inflations and protamine infusion after initial wire related perforation of the distal diagonal branch.  1st Diag lesion is 100% stenosed.   SUMMARY  Culprit lesion:   90% ostial 1st Diag (jailed) -successful scoring balloon angioplasty using 2.5 mm Wolverine scoring balloon  --Distal diagonal wire perforation with pericardial effusion and initial stages of pericardial tamponade --> successful occlusion with prolonged balloon inflations and administration of protamine  --Unsuccessful pericardiocentesis  Widely patent LAD stent  Otherwise stable coronary arteries.  Normal LVEF and EDP pre-PCI  Clear ST elevations for roughly 15 minutes, most likely type for MI.   RECOMMENDATIONS  Admit to CCU on Levophed --continue Levophed overnight  Monitor closely for signs of tamponade.  Follow troponins for likely type IV MI  Stat echocardiogram ordered.  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  Dr. [name] from [facility] is aware the patient & current condition   ECHOCARDIOGRAM LIMITED REPORT  12/05/2018 Limited echo for effusion. Max measurements:  Anterior RV 0.78 cm parasternal, 0.975 cm apical window   No assessment for hemodynamics/tamponade on this study,  though limited views do not show RV diastolic collapse. Recommend clinical Correlation.  ECHOCARDIOGRAM LIMITED REPORT  12/06/2018  1. The left ventricle has normal systolic function, with an ejection fraction of 55-60%. The cavity size was normal. Left ventricular diastolic parameters were normal.  2. Small pericardial effusion seen at the apex, lateral wall and anterior to RV.  3. The mitral valve is degenerative. Mild thickening of the mitral valve leaflet. Mild calcification of the mitral valve leaflet.  4. The aortic valve is tricuspid There is Moderate thickening of the aortic valve There is Mild calcification of the aortic valveAortic valve regurgitation is trivial by color flow Doppler.   History of Present Illness     68 year old male with hx of CAD s/p DES to LAD 08/2018, HTN, HLD and OSA on CPAP presented for cath due to abnormal stress test.   He was a longtime patient of Dr. Lowella Fairy. The patient-Devon Mathews a heart catheterization in 2003 that showed a 40% LAD lesion. Relook cath in 2006again showed a 40% LAD lesion. He had several Myoview's following this, the last one was in 2017, they were all low risk. Echocardiogram in October 2017 showed good LV function with mild LVH.  The patient presented to [facility] 09/13/2018 with chest pain consistent with unstable angina. He ruled in for a non-STEMI with a troponin peak of 0.15. He underwent diagnostic catheterization and LAD stenting on 09/14/2018. He had 20% left main narrowing but no other significant disease. His LV function was preserved.  Recently seen in clinic for SSCP. Follow up stress test showed multivessel distribution abnormality and abnormal treadmill portion as well. Seen by DOD Dr. [name] and presented for cath.    Hospital Course     Consultants: None  Coronary artery disease with diagonal microperforation: Cathy showed 90% ostial diagonal (jailed) status post scoring balloon angioplasty  reducing to 20% stenosis. However procedure was complicated by wire perforation of the distal diagonal branch with evidence of pericardial effusion and beginning of tamponade physiology with borderline cardiogenic shock requiring Levophed and IV fluid boluses.  The electrocardiogram today continues to show 1 to 2 mm of ST elevation diffusely, consistent with pericarditis. No symptoms concerning for worsening effusion or tamponade.Amulating well without recurrent symptoms. Continue dual antiplatelet therapy and other medication therapy as prior to this admission. He will need limited 2D Doppler echocardiogram during follow up to r/o pericardial effusion.   Hemorrhagic pericarditis:  Post-procedure developed EKG evidence of pericarditis and also chest pain consistent with the same. Chest pain improved with addition of colchicine. We will avoid nonsteroidal therapy because of recent stents.  If chest discomfort worsens, a tapering dose short-term corticosteroid therapy would be appropriate.  Acute kidney injury, stage III: - SCr 1.28 post procedure. Now normalized.   The patient been seen by Dr. Tamala Julian  today and deemed ready for discharge home. All follow-up appointments have been scheduled. Discharge medications are listed below.   Discharge Vitals Blood pressure (!) 141/76, pulse 82, temperature 98.6 F (37 C), temperature source Oral, resp. rate 18, height 5\' 10"  (1.778 m), weight 120.6 kg, SpO2 95 %.  Filed Weights   12/05/18 0718 12/07/18 0049 12/08/18 0537  Weight: 120.2 kg 121.4 kg 120.6 kg    Labs & Radiologic Studies    CBC Recent Labs    12/06/18 0714 12/07/18 0932  WBC 14.5* 11.2*  HGB 15.5  13.8  HCT 45.9 41.0  MCV 102.0* 101.7*  PLT 258 254   Basic Metabolic Panel Recent Labs    12/07/18 0932 12/08/18 0832  NA 134* 135  K 4.9 4.2  CL 106 104  CO2 16* 23  GLUCOSE 163* 147*  BUN 30* 27*  CREATININE 1.09 0.96  CALCIUM 8.6* 8.5*   Cardiac Enzymes Recent Labs     12/05/18 1329 12/05/18 1903 12/06/18 0034  TROPONINI 1.27* 0.89* 1.18*   Disposition   Pt is being discharged home today in good condition.  Follow-up Plans & Appointments    Follow-up Information    Tiron, Suski, [place]. Go on 12/15/2018.   Specialties:  Cardiology, Radiology Why:  @2 :30pm for hospital follow up Contact information: [address] [place] [address] [phone]          Discharge Instructions    Amb Referral to Cardiac Rehabilitation   Complete by:  As directed    Diagnosis:   PTCA NSTEMI Coronary Stents     Diet - low sodium heart healthy   Complete by:  As directed    Discharge instructions   Complete by:  As directed    NO HEAVY LIFTING (>10lbs) X 2 WEEKS. NO SEXUAL ACTIVITY X 2 WEEKS. NO DRIVING X 1 WEEK. NO SOAKING BATHS, HOT TUBS, POOLS, ETC., X 7 DAYS. No return to work until seen in clinic.   Increase activity slowly   Complete by:  As directed       Discharge Medications   Allergies as of 12/08/2018      Reactions   Penicillins Hives, Nausea And Vomiting   Did it involve swelling of the face/tongue/throat, SOB, or low BP? No Did it involve sudden or severe rash/hives, skin peeling, or any reaction on the inside of your mouth or nose? No Did you need to seek medical attention at a hospital or doctor's office? No When did it last happen?30 + years If all above answers are "NO", may proceed with cephalosporin use.      Medication List    TAKE these medications   amitriptyline 25 MG tablet Commonly known as:  ELAVIL Take 25 mg by mouth at bedtime as needed for sleep.   aspirin EC 81 MG tablet Take 81 mg by mouth daily.   atorvastatin 80 MG tablet Commonly known as:  LIPITOR Take 1 tablet (80 mg total) by mouth daily at 6 PM.   colchicine 0.6 MG tablet Take 1 tablet (0.6 mg total) by mouth 2 (two) times daily.   hydrochlorothiazide 12.5 MG capsule Commonly known as:  MICROZIDE Take 1 capsule (12.5  mg total) by mouth daily.   methocarbamol 500 MG tablet Commonly known as:  ROBAXIN Take 500 mg by mouth 3 (three) times daily as needed for muscle spasms.   metoprolol succinate 50 MG 24 hr tablet Commonly known as:  TOPROL-XL Take 1 tablet (50 mg total) by mouth daily. Take with or immediately following a meal.   nitroGLYCERIN 0.4 MG SL tablet Commonly known as:  NITROSTAT Place 1 tablet (0.4 mg total) under the tongue every 5 (five) minutes x 3 doses as needed for chest pain.   pantoprazole 40 MG tablet Commonly known as:  PROTONIX TAKE ONE TABLET BY MOUTH ONE TIME DAILY. must make follow up appointment for further refills What changed:  See the new instructions.   ticagrelor 90 MG Tabs tablet Commonly known as:  BRILINTA Take 1 tablet (90 mg total) by mouth  2 (two) times daily.        Acute coronary syndrome (MI, NSTEMI, STEMI, etc) this admission?: No.    Outstanding Labs/Studies   Limited echo during follow up  Duration of Discharge Encounter   Greater than 30 minutes including physician time.  Jarrett Soho, PA 12/08/2018, 12:47 PM

## 2018-12-08 NOTE — Telephone Encounter (Signed)
Closed this encounter. New encounter made regarding TOC

## 2018-12-08 NOTE — Telephone Encounter (Signed)
  VIN BHAGAT  Requested TOC appt.  Appt made with [facility] 2/28

## 2018-12-08 NOTE — Telephone Encounter (Signed)
° °  VIN BHAGAT  Requested TOC appt.  Appt made with [facility] 2/28

## 2018-12-11 ENCOUNTER — Ambulatory Visit (HOSPITAL_COMMUNITY): Payer: BLUE CROSS/BLUE SHIELD

## 2018-12-11 ENCOUNTER — Telehealth: Payer: Self-pay | Admitting: Cardiology

## 2018-12-11 NOTE — Telephone Encounter (Signed)
New Message   PT is calling because he said he left the hospital and he has had diarrhea since last week and is wondering if their is anything he can take for it Please call back

## 2018-12-11 NOTE — Telephone Encounter (Signed)
Lm to call back ./cy 

## 2018-12-11 NOTE — Telephone Encounter (Signed)
Spoke with pt, he was given something in the hosp to help him have a BM and has had diarrhea since then. He was given the okay to take imodium OTC. He has a follow up later this week.

## 2018-12-11 NOTE — Telephone Encounter (Signed)
Patient contacted regarding discharge from [facility] on 12/05/18.  Patient understands to follow up with provider Kerin Ransom on 12/15/18 at 2:30 PM at [facility]. Patient understands discharge instructions? YES Patient understands medications and regiment? YES Patient understands to bring all medications to this visit? YES   PT ASKED IF NEEDED TO KEEP 12/15/18 APPT AND INSTRUCTED NEEDED TO KEEP FOR INSURACE PURPOSES PT VERBALIZED UNDERSTANDING

## 2018-12-13 ENCOUNTER — Encounter: Payer: Self-pay | Admitting: Cardiology

## 2018-12-13 ENCOUNTER — Ambulatory Visit: Payer: BLUE CROSS/BLUE SHIELD | Admitting: Cardiology

## 2018-12-13 ENCOUNTER — Telehealth (HOSPITAL_COMMUNITY): Payer: Self-pay

## 2018-12-13 ENCOUNTER — Ambulatory Visit (HOSPITAL_COMMUNITY): Payer: BLUE CROSS/BLUE SHIELD

## 2018-12-13 NOTE — Telephone Encounter (Signed)
Pt insurance is active and benefits verified through [place]. Co-pay $0.00, DED $750.00/$750.00 met, out of pocket $5,000.00/$1,412.97 met, co-insurance 20%. No pre-authorization required. Passport, 12/13/2018 @ 12:11PM, SJG#[id number]  Will contact patient to see if he is interested in the Cardiac Rehab Program. If interested, patient will need to complete follow up appt. Once completed, patient will be contacted for scheduling upon review by the RN Navigator.

## 2018-12-13 NOTE — Telephone Encounter (Signed)
Called patient to see if he is interested in the Cardiac Rehab Program. Patient expressed interest. Explained scheduling process and went over insurance, patient verbalized understanding. Will contact patient for scheduling once f/u has been completed.  °

## 2018-12-14 NOTE — Progress Notes (Signed)
Transitions of Care Follow Up Call Note  Devon Mathews. is an 68 y.o. male who presented to [facility] on 12/05/2018.  The patient had the following prescriptions filled at [facility]: colchicine  Patient was called by pharmacist and HIPAA identifiers were verified. The following questions were asked about the prescriptions filled at [facility]: none  Has the patient been experiencing any side effects to the medications prescribed? Yes - patient stated he has had episodes of diarrhea since starting colchicine. Patient said he has used imodium as needed, which has helped. Patient scheduled for follow-up with MD 12/15/18 and will mention the GI upset. Understanding of regimen: excellent Understanding of indications: excellent Potential of compliance: excellent  Pharmacist comments: Patient understood their regimen and had no questions.  [x]  Patient's prescriptions filled at the [facility] Transitions of Care Pharmacy were transferred to the following pharmacy: [facility] [address]  []  Patient unable to be reached after calling three times and prescriptions filled at the [facility] Transitions of Care Pharmacy were transferred to preferred pharmacy found within their chart.   Britt Boozer 12/14/2018, 5:14 PM Transitions of Care Pharmacy Hours: Monday - Friday 8:30am to 5:00 PM  Phone - [phone]

## 2018-12-15 ENCOUNTER — Ambulatory Visit (HOSPITAL_COMMUNITY): Payer: BLUE CROSS/BLUE SHIELD

## 2018-12-15 ENCOUNTER — Encounter: Payer: Self-pay | Admitting: Cardiology

## 2018-12-15 ENCOUNTER — Ambulatory Visit: Payer: BLUE CROSS/BLUE SHIELD | Admitting: Cardiology

## 2018-12-15 VITALS — BP 142/83 | HR 85 | Ht 69.0 in | Wt 266.0 lb

## 2018-12-15 DIAGNOSIS — G4733 Obstructive sleep apnea (adult) (pediatric): Secondary | ICD-10-CM

## 2018-12-15 DIAGNOSIS — I2510 Atherosclerotic heart disease of native coronary artery without angina pectoris: Secondary | ICD-10-CM

## 2018-12-15 DIAGNOSIS — Z9861 Coronary angioplasty status: Secondary | ICD-10-CM

## 2018-12-15 DIAGNOSIS — I214 Non-ST elevation (NSTEMI) myocardial infarction: Secondary | ICD-10-CM | POA: Diagnosis not present

## 2018-12-15 DIAGNOSIS — I252 Old myocardial infarction: Secondary | ICD-10-CM | POA: Diagnosis not present

## 2018-12-15 DIAGNOSIS — I10 Essential (primary) hypertension: Secondary | ICD-10-CM | POA: Diagnosis not present

## 2018-12-15 DIAGNOSIS — E785 Hyperlipidemia, unspecified: Secondary | ICD-10-CM

## 2018-12-15 NOTE — Assessment & Plan Note (Signed)
Compliant with CPAP 

## 2018-12-15 NOTE — Assessment & Plan Note (Signed)
99% p-mLAD treated with DES 09/14/2018 Chest pain as an OP -Myoview- then cath and PCI (POBA) to jailed Dx 12/05/2018. Procedure complicated by vessel perforation, hemorrhagic tamponade, cardiogenic shock, and AKI. His SCr at discharge was 0.96, echo 12/06/2018- small apical pericardial effusion with normal LVF

## 2018-12-15 NOTE — Assessment & Plan Note (Signed)
The patient presented 09/13/2018 with a non-ST elevation MI, troponin peak was 0.15 Troponin peak post attempted PCI 12/05/2018- 1.27 Echo- EF 55-60%

## 2018-12-15 NOTE — Patient Instructions (Addendum)
Medication Instructions:  Your physician recommends that you continue on your current medications as directed. Please refer to the Current Medication list given to you today. If you need a refill on your cardiac medications before your next appointment, please call your pharmacy.   Lab work: None  If you have labs (blood work) drawn today and your tests are completely normal, you will receive your results only by: Marland Kitchen MyChart Message (if you have MyChart) OR . A paper copy in the mail If you have any lab test that is abnormal or we need to change your treatment, we will call you to review the results.  Testing/Procedures: None   Follow-Up: At [facility], you and your health needs are our priority.  As part of our continuing mission to provide you with exceptional heart care, we have created designated Provider Care Teams.  These Care Teams include your primary Cardiologist (physician) and Advanced Practice Providers (APPs -  Physician Assistants and Nurse Practitioners) who all work together to provide you with the care you need, when you need it. . Follow up with Dr. [name] on 12/27/2018 at 3:20pm.  Any Other Special Instructions Will Be Listed Below (If Applicable).

## 2018-12-15 NOTE — Assessment & Plan Note (Signed)
Controlled.  

## 2018-12-15 NOTE — Progress Notes (Signed)
12/15/2018 Devon Mathews   15-Nov-1950  [id number]  Primary Physician Tisovec, [name], MD Primary Cardiologist: Dr Oval [name]  HPI:  Pt is a pleasant 68 year old male who is now followed by Dr. Oval [name]. His father, Devon Mathews, Sr, was a longtime patient of Dr. Lowella Fairy. The patient-Devon Mathews a heart catheterization in 2003 that showed a 40% LAD lesion. Relook cath in 2006again showed a 40% LAD lesion. He had several Myoview's following this, the last one was in 2017, they were all low risk. Echocardiogram in October 2017 showed good LV function with mild LVH. Other medical problems include a family history of coronary disease, HTN,obesity,dyslipidemia, andsleep apnea on C-pap  The patient presented to [facility] 09/13/2018 with chest pain consistent with unstable angina. He ruled in for a non-STEMI with a troponin peak of 0.15. He underwent diagnostic catheterization and LAD stenting on 09/14/2018. He had 20% left main narrowing but no other significant disease. His LV function was preserved. He was started on lipid therapy,low-dose diuretic, and his beta blocker was increased.I saw him 09/26/18 and Dr Oval [name] saw him 10/12/18.   He developed chest discomfort as an OP concerning for angina.  Myoview was done 12/01/2018 and was abnormal.  He was admitted for diagnostic cath 12/05/2018.  This revealed a 95% jailed Dx1, the previously placed LAD stent was patent.  The pt underwent PCI to the Dx that was complicated by vessel perforation and pericardial effusion.  He was followed closely by Dr [name]  And Dr [name].  Fortunately he did not require pericardiocentesis or surgery.  He was discharged 12/08/2018 on colchicine.  He is in the office today for follow up. He has had some vague chest discomfort but in retrospect he thinks this was indigestion. He has ecchymosis on his abdomin and was concerned about this.    Current Outpatient Medications  Medication Sig  Dispense Refill  . amitriptyline (ELAVIL) 25 MG tablet Take 25 mg by mouth at bedtime as needed for sleep.    Marland Kitchen aspirin EC 81 MG tablet Take 81 mg by mouth daily.    Marland Kitchen atorvastatin (LIPITOR) 80 MG tablet Take 1 tablet (80 mg total) by mouth daily at 6 PM. 30 tablet 11  . colchicine 0.6 MG tablet Take 1 tablet (0.6 mg total) by mouth 2 (two) times daily. 60 tablet 3  . hydrochlorothiazide (MICROZIDE) 12.5 MG capsule Take 1 capsule (12.5 mg total) by mouth daily. 30 capsule 11  . methocarbamol (ROBAXIN) 500 MG tablet Take 500 mg by mouth 3 (three) times daily as needed for muscle spasms.    . metoprolol succinate (TOPROL-XL) 50 MG 24 hr tablet Take 1 tablet (50 mg total) by mouth daily. Take with or immediately following a meal. 30 tablet 11  . nitroGLYCERIN (NITROSTAT) 0.4 MG SL tablet Place 1 tablet (0.4 mg total) under the tongue every 5 (five) minutes x 3 doses as needed for chest pain. 25 tablet 11  . pantoprazole (PROTONIX) 40 MG tablet TAKE ONE TABLET BY MOUTH ONE TIME DAILY. must make follow up appointment for further refills (Patient taking differently: Take 40 mg by mouth daily. ) 15 tablet 0  . ticagrelor (BRILINTA) 90 MG TABS tablet Take 1 tablet (90 mg total) by mouth 2 (two) times daily. 60 tablet 0   No current facility-administered medications for this visit.     Allergies  Allergen Reactions  . Penicillins Hives and Nausea And Vomiting    Did it involve swelling of  the face/tongue/throat, SOB, or low BP? No Did it involve sudden or severe rash/hives, skin peeling, or any reaction on the inside of your mouth or nose? No Did you need to seek medical attention at a hospital or doctor's office? No When did it last happen?30 + years If all above answers are "NO", may proceed with cephalosporin use.     Past Medical History:  Diagnosis Date  . CAD S/P percutaneous coronary angioplasty 09/13/2018    99% p-mLAD (BTW d1&d2) -> SYNERGY DES 3.5 X 12 (3.75 mm). 20% LM.  Cx,  small RI & co-dom RCA normal.  EF 65%.   . Dizziness 2012   normal findings..  . Essential hypertension 07/28/2016  . Lower extremity edema 07/28/2016  . NSTEMI (non-ST elevated myocardial infarction) ([place]) 09/13/2018   The patient presented 09/13/2018 with a non-ST elevation MI, troponin peak was 0.15  . Obesity   . Obesity (BMI 30-39.9) 07/28/2016  . OSA (obstructive sleep apnea) 07/28/2016  . Restless leg syndrome    mild  . Shortness of breath 07/28/2016    Social History   Socioeconomic History  . Marital status: Married    Spouse name: Not on file  . Number of children: Not on file  . Years of education: Not on file  . Highest education level: Not on file  Occupational History  . Not on file  Social Needs  . Financial resource strain: Not on file  . Food insecurity:    Worry: Not on file    Inability: Not on file  . Transportation needs:    Medical: Not on file    Non-medical: Not on file  Tobacco Use  . Smoking status: Never Smoker  . Smokeless tobacco: Never Used  Substance and Sexual Activity  . Alcohol use: No  . Drug use: No  . Sexual activity: Not on file  Lifestyle  . Physical activity:    Days per week: Not on file    Minutes per session: Not on file  . Stress: Not on file  Relationships  . Social connections:    Talks on phone: Not on file    Gets together: Not on file    Attends religious service: Not on file    Active member of club or organization: Not on file    Attends meetings of clubs or organizations: Not on file    Relationship status: Not on file  . Intimate partner violence:    Fear of current or ex partner: Not on file    Emotionally abused: Not on file    Physically abused: Not on file    Forced sexual activity: Not on file  Other Topics Concern  . Not on file  Social History Narrative  . Not on file     Family History  Problem Relation Age of Onset  . Heart failure Mother   . Other Father        bypass surgery  . Heart  disease Father   . Heart failure Father   . COPD Father   . Heart attack Sister   . Diabetes Sister   . Heart failure Sister   . Diabetes Brother   . Other Maternal Grandfather        smoking related disease  . Heart attack Paternal Grandmother      Review of Systems: General: negative for chills, fever, night sweats or weight changes.  Cardiovascular: negative for chest pain, dyspnea on exertion, edema, orthopnea, palpitations, paroxysmal nocturnal dyspnea  or shortness of breath Dermatological: negative for rash Respiratory: negative for cough or wheezing Urologic: negative for hematuria Abdominal: negative for nausea, vomiting, diarrhea, bright red blood per rectum, melena, or hematemesis Neurologic: negative for visual changes, syncope, or dizziness All other systems reviewed and are otherwise negative except as noted above.    Blood pressure (!) 142/83, pulse 85, height 5\' 9"  (1.753 m), weight 266 lb (120.7 kg).  General appearance: alert, cooperative, no distress and morbidly obese Lungs: clear to auscultation bilaterally Heart: regular rate and rhythm and no rub Abdomen: obese, ecchymosis noted, no hematoma Skin: pale, warm, dry Neurologic: Grossly normal  EKG-NSR inferior Qs-new  ASSESSMENT AND PLAN:   CAD S/P percutaneous coronary angioplasty  99% p-mLAD treated with DES 09/14/2018 Chest pain as an OP -Myoview- then cath and PCI (POBA) to jailed Dx 12/05/2018. Procedure complicated by vessel perforation, hemorrhagic tamponade, cardiogenic shock, and AKI. His SCr at discharge was 0.96, echo 12/06/2018- small apical pericardial effusion with normal LVF  History of non-ST elevation myocardial infarction (NSTEMI) The patient presented 09/13/2018 with a non-ST elevation MI, troponin peak was 0.15 Troponin peak post attempted PCI 12/05/2018- 1.27 Echo- EF 55-60%  Essential hypertension Controlled  OSA (obstructive sleep apnea) Compliant with CPAP  Hyperlipidemia  LDL goal <70 LDL-69   PLAN  No change in medical Rx- f/u with Dr [name] in a few weeks to release him to return to work.   Yanixan Mellinger PA-C 12/15/2018 3:07 PM

## 2018-12-15 NOTE — Assessment & Plan Note (Signed)
LDL-69

## 2018-12-18 ENCOUNTER — Telehealth: Payer: Self-pay | Admitting: Cardiology

## 2018-12-18 ENCOUNTER — Ambulatory Visit (HOSPITAL_COMMUNITY): Payer: BLUE CROSS/BLUE SHIELD

## 2018-12-18 ENCOUNTER — Telehealth: Payer: Self-pay | Admitting: Cardiovascular Disease

## 2018-12-18 DIAGNOSIS — I509 Heart failure, unspecified: Secondary | ICD-10-CM | POA: Diagnosis not present

## 2018-12-18 DIAGNOSIS — I4891 Unspecified atrial fibrillation: Secondary | ICD-10-CM | POA: Diagnosis not present

## 2018-12-18 DIAGNOSIS — I2510 Atherosclerotic heart disease of native coronary artery without angina pectoris: Secondary | ICD-10-CM | POA: Diagnosis not present

## 2018-12-18 DIAGNOSIS — R0602 Shortness of breath: Secondary | ICD-10-CM | POA: Diagnosis not present

## 2018-12-18 NOTE — Telephone Encounter (Signed)
Pt c/o Shortness Of Breath: STAT if SOB developed within the last 24 hours or pt is noticeably SOB on the phone  1. Are you currently SOB (can you hear that pt is SOB on the phone)? no  2. How long have you been experiencing SOB? Saturday  3. Are you SOB when sitting or when up moving around? both  4. Are you currently experiencing any other symptoms? Having a hard time taking a deep breath.   Taking brilinta like normal, has not been able to sleep. Tries to lay down and feels like he is being smothered. Wears CPAP machine and he feels like the machine is smothering him. Is worried there may be fluid in his lungs, or may be developing bronchitis. Didn't want to go to the ED with the Flu going around.

## 2018-12-18 NOTE — Telephone Encounter (Addendum)
Spoke with pt who states that he has SOB that started Saturday afternoon. Pt states that he cannot take deep breaths and has difficulty breathing when lying down. Pt states that he recently finds it difficult to sleep with his CPAP mask on because it makes him feel like he is being smothered. He does state that maintaining an upright position helps relieve his SOB. He states that he spoke to 'an on-call doctor' who questioned if he was taking his Brilinta regularly which he states he is taking as prescribed. He also states he is taking his Microzide as prescribed. Pt denies edema to any extremities and states that he believes he has stayed about the same weight since he was last weighed in [place] office on 2/28 but that he does not weigh himself regularly at home. He states that he develops bronchitis around this time each year so it may be responsible for his symptoms, but that he also has concern that there may be fluid in his lungs. He is apprehensive about reporting to ED b/c of fear of exposure to flu. Pt has upcoming visit with Dr. [name] on 3/11 and informed that no available appts with APP or cards sooner than 3/11. Pt states that he will report to ED  Pt has recent Hx of pericardial effusion and he states that he was told during last OV that it was being managed by colchicine Rx

## 2018-12-18 NOTE — Telephone Encounter (Signed)
The benefit of going to emergency room will be that we could reevaluate an echocardiogram and probably chest x-ray to ensure there is no worsening pericardial effusion. ->  If not done with emergency room visit, we should check chest x-ray and echocardiogram in the outpatient setting.  My overall suspicion is that this may be dyspnea related to Brilinta. Recommend that he takes the Brilinta with caffeine.  If this still is symptomatic, would switch to Plavix.  I would also like to give him a prescription for furosemide 40 mg to take twice daily for 2 days and then once daily until seen in follow-up on March 11 unless he is seen sooner.  Glenetta Hew, MD

## 2018-12-18 NOTE — Telephone Encounter (Signed)
Pt called in this morning reporting shortness of breath. Was recently admitted after cath with complication of vessel perforation and pericardial effusion. Placed on colchicine and seen back in the office on 2/28. Reports Saturday evening he noticed he felt short of breath when trying to sleep. Felt ok during the day. Again short of breath when trying to sleep, even with sitting up in the chair. Does not feel short of breath with any other activity. No chest pain. Offered to send a message to the office to see if he could be seen today, vs to the ED. Asked that I send a message and will go to the ED if symptoms change or worsen. ED precautions given.   Devon Mathews

## 2018-12-19 NOTE — Telephone Encounter (Signed)
Not exactly sure why she stopped aspirin and started Eliquis.  Unless there is A. fib, no thing that makes any sense.  I think we can convert from Brilinta to Plavix. -->  Recommend first dose Plavix would be 300 mg then 75 mg daily. This would be an level of first morning dose of Brilinta.  Need to clarify if there is or is not any A. fib.  If not A. fib, I think that Eliquis is not necessary and we can simply just to aspirin Plavix.  However if there was A. fib then we would do Eliquis and Plavix.  Of note.  I think Mr. Helmstetter is Dr. Blenda Mounts patient for future follow-up.  Glenetta Hew, MD

## 2018-12-19 NOTE — Telephone Encounter (Signed)
Spoke to patient . Patient states  He saw his primary  Office yesterday saw Devon Hayward NP.  per patient , the extender was sending  information of office visit    Patient states Ms Devon Mathews did lab work , chest xray, added lasix 20 mg daily and potasium   - states patient has heart failure. Stopped his Aspirin . Stopped hctz ,andstarted eliquis 5 mg twice a day.   Patient states he tired taking caffeine with brilinta that does not work . Patient states when he lays down flat he feels full  Even with using  c-pa pmachine.     RN INFORMED PATIENT OF DR HARDING INSTRUCTIONS TO TAKE  40 MG LASIX FOR 2 DAYS THEN 40 MG DAILY.  PATIENT AWARE WILL SEND MESSAGE TO DR HARDING  AND Devon Mathews TO SEE IF EITHER RECEIVED INFORMATION FROM [facility]

## 2018-12-20 ENCOUNTER — Telehealth: Payer: Self-pay

## 2018-12-20 ENCOUNTER — Emergency Department (HOSPITAL_COMMUNITY): Payer: BLUE CROSS/BLUE SHIELD

## 2018-12-20 ENCOUNTER — Inpatient Hospital Stay (HOSPITAL_COMMUNITY)
Admission: EM | Admit: 2018-12-20 | Discharge: 2018-12-27 | DRG: 271 | Disposition: A | Discharge status: Home / Self Care | Payer: BLUE CROSS/BLUE SHIELD | Attending: Surgery | Admitting: Surgery

## 2018-12-20 ENCOUNTER — Ambulatory Visit (HOSPITAL_COMMUNITY): Payer: BLUE CROSS/BLUE SHIELD

## 2018-12-20 ENCOUNTER — Other Ambulatory Visit: Payer: Self-pay

## 2018-12-20 ENCOUNTER — Inpatient Hospital Stay (HOSPITAL_COMMUNITY): Payer: BLUE CROSS/BLUE SHIELD

## 2018-12-20 ENCOUNTER — Telehealth: Payer: Self-pay | Admitting: Cardiovascular Disease

## 2018-12-20 ENCOUNTER — Encounter (HOSPITAL_COMMUNITY): Payer: Self-pay | Admitting: Emergency Medicine

## 2018-12-20 DIAGNOSIS — I4891 Unspecified atrial fibrillation: Secondary | ICD-10-CM | POA: Diagnosis not present

## 2018-12-20 DIAGNOSIS — Z825 Family history of asthma and other chronic lower respiratory diseases: Secondary | ICD-10-CM | POA: Diagnosis not present

## 2018-12-20 DIAGNOSIS — Z79899 Other long term (current) drug therapy: Secondary | ICD-10-CM | POA: Diagnosis not present

## 2018-12-20 DIAGNOSIS — I9788 Other intraoperative complications of the circulatory system, not elsewhere classified: Secondary | ICD-10-CM | POA: Diagnosis present

## 2018-12-20 DIAGNOSIS — I313 Pericardial effusion (noninflammatory): Secondary | ICD-10-CM | POA: Diagnosis not present

## 2018-12-20 DIAGNOSIS — G459 Transient cerebral ischemic attack, unspecified: Secondary | ICD-10-CM | POA: Diagnosis not present

## 2018-12-20 DIAGNOSIS — R600 Localized edema: Secondary | ICD-10-CM | POA: Diagnosis present

## 2018-12-20 DIAGNOSIS — I110 Hypertensive heart disease with heart failure: Secondary | ICD-10-CM | POA: Diagnosis not present

## 2018-12-20 DIAGNOSIS — I314 Cardiac tamponade: Secondary | ICD-10-CM | POA: Diagnosis present

## 2018-12-20 DIAGNOSIS — I4819 Other persistent atrial fibrillation: Secondary | ICD-10-CM | POA: Diagnosis not present

## 2018-12-20 DIAGNOSIS — I509 Heart failure, unspecified: Secondary | ICD-10-CM | POA: Diagnosis not present

## 2018-12-20 DIAGNOSIS — Z8673 Personal history of transient ischemic attack (TIA), and cerebral infarction without residual deficits: Secondary | ICD-10-CM | POA: Diagnosis present

## 2018-12-20 DIAGNOSIS — E785 Hyperlipidemia, unspecified: Secondary | ICD-10-CM | POA: Diagnosis not present

## 2018-12-20 DIAGNOSIS — D7589 Other specified diseases of blood and blood-forming organs: Secondary | ICD-10-CM | POA: Diagnosis present

## 2018-12-20 DIAGNOSIS — Z8249 Family history of ischemic heart disease and other diseases of the circulatory system: Secondary | ICD-10-CM | POA: Diagnosis not present

## 2018-12-20 DIAGNOSIS — I252 Old myocardial infarction: Secondary | ICD-10-CM | POA: Diagnosis not present

## 2018-12-20 DIAGNOSIS — G4733 Obstructive sleep apnea (adult) (pediatric): Secondary | ICD-10-CM | POA: Diagnosis present

## 2018-12-20 DIAGNOSIS — I3139 Other pericardial effusion (noninflammatory): Secondary | ICD-10-CM

## 2018-12-20 DIAGNOSIS — Z833 Family history of diabetes mellitus: Secondary | ICD-10-CM | POA: Diagnosis not present

## 2018-12-20 DIAGNOSIS — I351 Nonrheumatic aortic (valve) insufficiency: Secondary | ICD-10-CM | POA: Diagnosis not present

## 2018-12-20 DIAGNOSIS — I10 Essential (primary) hypertension: Secondary | ICD-10-CM | POA: Diagnosis present

## 2018-12-20 DIAGNOSIS — Z9861 Coronary angioplasty status: Secondary | ICD-10-CM | POA: Diagnosis not present

## 2018-12-20 DIAGNOSIS — G2581 Restless legs syndrome: Secondary | ICD-10-CM | POA: Diagnosis present

## 2018-12-20 DIAGNOSIS — I5032 Chronic diastolic (congestive) heart failure: Secondary | ICD-10-CM | POA: Diagnosis present

## 2018-12-20 DIAGNOSIS — Z6838 Body mass index (BMI) 38.0-38.9, adult: Secondary | ICD-10-CM | POA: Diagnosis not present

## 2018-12-20 DIAGNOSIS — I25119 Atherosclerotic heart disease of native coronary artery with unspecified angina pectoris: Secondary | ICD-10-CM | POA: Diagnosis not present

## 2018-12-20 DIAGNOSIS — I480 Paroxysmal atrial fibrillation: Secondary | ICD-10-CM | POA: Diagnosis present

## 2018-12-20 DIAGNOSIS — Z7902 Long term (current) use of antithrombotics/antiplatelets: Secondary | ICD-10-CM

## 2018-12-20 DIAGNOSIS — I2510 Atherosclerotic heart disease of native coronary artery without angina pectoris: Secondary | ICD-10-CM | POA: Diagnosis not present

## 2018-12-20 DIAGNOSIS — Z880 Allergy status to penicillin: Secondary | ICD-10-CM

## 2018-12-20 DIAGNOSIS — Z7901 Long term (current) use of anticoagulants: Secondary | ICD-10-CM | POA: Diagnosis not present

## 2018-12-20 DIAGNOSIS — Z955 Presence of coronary angioplasty implant and graft: Secondary | ICD-10-CM

## 2018-12-20 DIAGNOSIS — R0602 Shortness of breath: Secondary | ICD-10-CM | POA: Diagnosis present

## 2018-12-20 DIAGNOSIS — I318 Other specified diseases of pericardium: Secondary | ICD-10-CM | POA: Diagnosis not present

## 2018-12-20 DIAGNOSIS — J90 Pleural effusion, not elsewhere classified: Secondary | ICD-10-CM | POA: Diagnosis not present

## 2018-12-20 DIAGNOSIS — R4182 Altered mental status, unspecified: Secondary | ICD-10-CM | POA: Diagnosis not present

## 2018-12-20 HISTORY — DX: Unspecified atrial fibrillation: I48.91

## 2018-12-20 LAB — COMPREHENSIVE METABOLIC PANEL
ALK PHOS: 85 U/L (ref 38–126)
ALT: 33 U/L (ref 0–44)
AST: 29 U/L (ref 15–41)
Albumin: 3.2 g/dL — ABNORMAL LOW (ref 3.5–5.0)
Anion gap: 11 (ref 5–15)
BUN: 13 mg/dL (ref 8–23)
CALCIUM: 8.9 mg/dL (ref 8.9–10.3)
CO2: 22 mmol/L (ref 22–32)
Chloride: 104 mmol/L (ref 98–111)
Creatinine, Ser: 1.08 mg/dL (ref 0.61–1.24)
GFR calc Af Amer: >60 mL/min (ref >60)
GFR calc non Af Amer: >60 mL/min (ref >60)
Glucose, Bld: 115 mg/dL — ABNORMAL HIGH (ref 70–99)
Potassium: 3.7 mmol/L (ref 3.5–5.1)
Sodium: 137 mmol/L (ref 135–145)
Total Bilirubin: 1.1 mg/dL (ref 0.3–1.2)
Total Protein: 6.6 g/dL (ref 6.5–8.1)

## 2018-12-20 LAB — CBC WITH DIFFERENTIAL/PLATELET
Abs Immature Granulocytes: 0.04 10*3/uL (ref 0.00–0.07)
Basophils Absolute: 0 10*3/uL (ref 0.0–0.1)
Basophils Relative: 1 %
EOS ABS: 0.2 10*3/uL (ref 0.0–0.5)
Eosinophils Relative: 3 %
HCT: 38.4 % — ABNORMAL LOW (ref 39.0–52.0)
Hemoglobin: 13 g/dL (ref 13.0–17.0)
Immature Granulocytes: 1 %
Lymphocytes Relative: 24 %
Lymphs Abs: 1.4 10*3/uL (ref 0.7–4.0)
MCH: 34.7 pg — AB (ref 26.0–34.0)
MCHC: 33.9 g/dL (ref 30.0–36.0)
MCV: 102.4 fL — ABNORMAL HIGH (ref 80.0–100.0)
Monocytes Absolute: 0.6 10*3/uL (ref 0.1–1.0)
Monocytes Relative: 11 %
Neutro Abs: 3.5 10*3/uL (ref 1.7–7.7)
Neutrophils Relative %: 60 %
Platelets: 265 10*3/uL (ref 150–400)
RBC: 3.75 MIL/uL — ABNORMAL LOW (ref 4.22–5.81)
RDW: 13.7 % (ref 11.5–15.5)
WBC: 5.7 10*3/uL (ref 4.0–10.5)
nRBC: 0 % (ref 0.0–0.2)

## 2018-12-20 LAB — URINALYSIS, ROUTINE W REFLEX MICROSCOPIC
Bilirubin Urine: NEGATIVE
Glucose, UA: NEGATIVE mg/dL
Hgb urine dipstick: NEGATIVE
Ketones, ur: NEGATIVE mg/dL
Leukocytes,Ua: NEGATIVE
Nitrite: NEGATIVE
Protein, ur: NEGATIVE mg/dL
SPECIFIC GRAVITY, URINE: 1.004 — AB (ref 1.005–1.030)
pH: 6 (ref 5.0–8.0)

## 2018-12-20 LAB — RAPID URINE DRUG SCREEN, HOSP PERFORMED
Amphetamines: NOT DETECTED
Barbiturates: NOT DETECTED
Benzodiazepines: NOT DETECTED
Cocaine: NOT DETECTED
Opiates: NOT DETECTED
Tetrahydrocannabinol: NOT DETECTED

## 2018-12-20 LAB — TROPONIN I: Troponin I: 0.03 ng/mL (ref <0.03)

## 2018-12-20 LAB — BRAIN NATRIURETIC PEPTIDE: B Natriuretic Peptide: 209.7 pg/mL — ABNORMAL HIGH (ref 0.0–100.0)

## 2018-12-20 LAB — CBG MONITORING, ED: Glucose-Capillary: 111 mg/dL — ABNORMAL HIGH (ref 70–99)

## 2018-12-20 MED ORDER — PANTOPRAZOLE SODIUM 40 MG PO TBEC
40.0000 mg | DELAYED_RELEASE_TABLET | Freq: Every day | ORAL | Status: DC
  Administered 2018-12-20 – 2018-12-26 (×7): 40 mg via ORAL
  Filled 2018-12-20 (×7): qty 1

## 2018-12-20 MED ORDER — TICAGRELOR 90 MG PO TABS: 90.0000 mg | ORAL_TABLET | Freq: Two times a day (BID) | ORAL | Status: DC

## 2018-12-20 MED ORDER — METHOCARBAMOL 500 MG PO TABS: 500.0000 mg | ORAL_TABLET | Freq: Three times a day (TID) | ORAL | Status: DC | PRN

## 2018-12-20 MED ORDER — AMITRIPTYLINE HCL 25 MG PO TABS
25.0000 mg | ORAL_TABLET | Freq: Every evening | ORAL | Status: DC | PRN
  Filled 2018-12-20: qty 1

## 2018-12-20 MED ORDER — DILTIAZEM HCL 25 MG/5ML IV SOLN
20.0000 mg | Freq: Once | INTRAVENOUS | Status: AC
  Administered 2018-12-20: 20 mg via INTRAVENOUS
  Filled 2018-12-20: qty 5

## 2018-12-20 MED ORDER — METOPROLOL SUCCINATE ER 25 MG PO TB24
75.0000 mg | ORAL_TABLET | Freq: Every day | ORAL | Status: DC
  Administered 2018-12-21: 25 mg via ORAL
  Filled 2018-12-20: qty 3

## 2018-12-20 MED ORDER — STROKE: EARLY STAGES OF RECOVERY BOOK
Freq: Once | Status: AC
  Administered 2018-12-20: 21:00:00

## 2018-12-20 MED ORDER — CLOPIDOGREL BISULFATE 75 MG PO TABS
75.0000 mg | ORAL_TABLET | Freq: Every day | ORAL | Status: DC
  Administered 2018-12-22 – 2018-12-27 (×5): 75 mg via ORAL
  Filled 2018-12-20 (×5): qty 1

## 2018-12-20 MED ORDER — FUROSEMIDE 40 MG PO TABS: 40.0000 mg | ORAL_TABLET | Freq: Every day | ORAL | Status: DC

## 2018-12-20 MED ORDER — NITROGLYCERIN 0.4 MG SL SUBL: 0.4000 mg | SUBLINGUAL_TABLET | SUBLINGUAL | Status: DC | PRN

## 2018-12-20 MED ORDER — ATORVASTATIN CALCIUM 80 MG PO TABS
80.0000 mg | ORAL_TABLET | Freq: Every day | ORAL | Status: DC
  Administered 2018-12-21 – 2018-12-26 (×6): 80 mg via ORAL
  Filled 2018-12-20 (×7): qty 1

## 2018-12-20 MED ORDER — FUROSEMIDE 10 MG/ML IJ SOLN
40.0000 mg | Freq: Once | INTRAMUSCULAR | Status: AC
  Administered 2018-12-20: 40 mg via INTRAVENOUS
  Filled 2018-12-20: qty 4

## 2018-12-20 MED ORDER — POTASSIUM CHLORIDE CRYS ER 10 MEQ PO TBCR
10.0000 meq | EXTENDED_RELEASE_TABLET | Freq: Every day | ORAL | Status: DC
  Administered 2018-12-21 – 2018-12-24 (×4): 10 meq via ORAL
  Filled 2018-12-20 (×4): qty 1

## 2018-12-20 MED ORDER — ACETAMINOPHEN 325 MG PO TABS
650.0000 mg | ORAL_TABLET | Freq: Four times a day (QID) | ORAL | Status: DC | PRN
  Administered 2018-12-22: 650 mg via ORAL
  Filled 2018-12-20: qty 2

## 2018-12-20 MED ORDER — METOPROLOL SUCCINATE ER 100 MG PO TB24: 100.0000 mg | ORAL_TABLET | Freq: Every day | ORAL | Status: DC

## 2018-12-20 MED ORDER — FUROSEMIDE 40 MG PO TABS
40.0000 mg | ORAL_TABLET | Freq: Two times a day (BID) | ORAL | Status: DC
  Administered 2018-12-20 – 2018-12-21 (×2): 40 mg via ORAL
  Filled 2018-12-20 (×2): qty 1

## 2018-12-20 MED ORDER — CLOPIDOGREL BISULFATE 75 MG PO TABS
300.0000 mg | ORAL_TABLET | Freq: Once | ORAL | Status: AC
  Administered 2018-12-21: 300 mg via ORAL
  Filled 2018-12-20: qty 4

## 2018-12-20 MED ORDER — SENNOSIDES-DOCUSATE SODIUM 8.6-50 MG PO TABS: 1.0000 | ORAL_TABLET | Freq: Every evening | ORAL | Status: DC | PRN

## 2018-12-20 MED ORDER — LORAZEPAM 2 MG/ML IJ SOLN
2.0000 mg | Freq: Once | INTRAMUSCULAR | Status: AC | PRN
  Administered 2018-12-20: 2 mg via INTRAVENOUS
  Filled 2018-12-20: qty 1

## 2018-12-20 MED ORDER — COLCHICINE 0.6 MG PO TABS
0.6000 mg | ORAL_TABLET | Freq: Two times a day (BID) | ORAL | Status: DC
  Administered 2018-12-20 – 2018-12-27 (×13): 0.6 mg via ORAL
  Filled 2018-12-20 (×13): qty 1

## 2018-12-20 MED ORDER — FUROSEMIDE 20 MG PO TABS: 20.0000 mg | ORAL_TABLET | ORAL | Status: DC

## 2018-12-20 MED ORDER — APIXABAN 5 MG PO TABS
5.0000 mg | ORAL_TABLET | Freq: Two times a day (BID) | ORAL | Status: DC
  Administered 2018-12-21: 5 mg via ORAL
  Filled 2018-12-20: qty 1

## 2018-12-20 NOTE — Procedures (Signed)
  Devon A. Merlene Laughter, MD     [email or web address]           HISTORY: The patient is a 68 year old male who presents with shaking of the right side worrisome for seizures.  MEDICATIONS:  Current Facility-Administered Medications:  .   stroke: mapping our early stages of recovery book, , Does not apply, Once, Gilles Chiquito B, MD .  acetaminophen (TYLENOL) tablet 650 mg, 650 mg, Oral, Q6H PRN, Sid Falcon, MD .  amitriptyline (ELAVIL) tablet 25 mg, 25 mg, Oral, QHS PRN, Sid Falcon, MD .  apixaban (ELIQUIS) tablet 5 mg, 5 mg, Oral, BID, Sid Falcon, MD .  Derrill Memo ON 12/21/2018] atorvastatin (LIPITOR) tablet 80 mg, 80 mg, Oral, q1800, Sid Falcon, MD .  Derrill Memo ON 12/21/2018] clopidogrel (PLAVIX) tablet 300 mg, 300 mg, Oral, Once, Sid Falcon, MD .  Derrill Memo ON 12/22/2018] clopidogrel (PLAVIX) tablet 75 mg, 75 mg, Oral, Daily, Gilles Chiquito B, MD .  colchicine tablet 0.6 mg, 0.6 mg, Oral, BID, Gilles Chiquito B, MD .  furosemide (LASIX) tablet 40 mg, 40 mg, Oral, BID, Sid Falcon, MD .  Derrill Memo ON 12/22/2018] furosemide (LASIX) tablet 40 mg, 40 mg, Oral, Daily, Gilles Chiquito B, MD .  methocarbamol (ROBAXIN) tablet 500 mg, 500 mg, Oral, TID PRN, Sid Falcon, MD .  Derrill Memo ON 12/21/2018] metoprolol succinate (TOPROL-XL) 24 hr tablet 75 mg, 75 mg, Oral, Q lunch, Gilles Chiquito B, MD .  nitroGLYCERIN (NITROSTAT) SL tablet 0.4 mg, 0.4 mg, Sublingual, Q5 Min x 3 PRN, Gilles Chiquito B, MD .  pantoprazole (PROTONIX) EC tablet 40 mg, 40 mg, Oral, QHS, Gilles Chiquito B, MD .  Derrill Memo ON 12/21/2018] potassium chloride (K-DUR,KLOR-CON) CR tablet 10 mEq, 10 mEq, Oral, Daily, Gilles Chiquito B, MD .  senna-docusate (Senokot-S) tablet 1 tablet, 1 tablet, Oral, QHS PRN, Sid Falcon, MD     ANALYSIS: A 16 channel recording using standard 10 20 measurements is conducted for 21 minutes.  There is a well-formed posterior dominant rhythm of 8.5 Hz which attenuates with eye  opening.  There is beta activity observed in frontal areas.  Awake and drowsy activities are observed.  Photic stimulation and hyperventilation are not carried out.  Focal or lateral slowing are not documented.  No clear epileptiform activities are appreciated.   IMPRESSION: 1.  This is a normal recording of the awake and drowsy states.      Saraiah Bhat A. Merlene Mathews, M.D.  Diplomate, Tax adviser of Psychiatry and Neurology ( Neurology).

## 2018-12-20 NOTE — H&P (Signed)
History and Physical    Devon Mathews. QIW:[id number] DOB: 1951/07/03 DOA: 12/20/2018  PCP: Haywood Pao, MD  Patient coming from: Home  Chief Complaint: Home  HPI: Devon Kai. is a 68 y.o. male with medical history significant of CAD, OSA, HTN, Afib, RLS who presents with acute neurological symptoms.  He had a recent complicated admission with PCI complicated by diagonal microperforation, hemorrhagic pericarditis, pericardial effusion, borderline cardiogenic shock, AKI.  He was doing well as of Friday when he was seen in the Cardiology on 2/28.  However, over the weekend he developed SOB and presented to his PCP where he was found to have new onset Afib and symptoms of heart failure.  He improved with lasix administration and was changed from brilinta to plavix and started on eliquis.  Today, he was visiting with a friend when he noted that he had hand shaking on the right, was trying to remove a CPAP which was not there and felt out of his head.  He remembers the event.  It lasted from 20-30 seconds.  He presented to the ED and was found to be in Afib again and symptoms had resolved.  Neurology and Dr. [name] from cardiology have also seen the patient.    ED Course: In the ED, he was noted to be in Afib with RVR into the 130s.  Labs were relatively normal, with a troponin of 0.03 (down from 1.18), BNP of 209.  He was seen by Neurology who recommended an EEG and MRI of the brain.  CT of the brain was normal.  CXR showed new small effusions bilaterally.  EKG showed Afib.    Review of Systems: As per HPI otherwise 10 point review of systems negative.   Past Medical History:  Diagnosis Date  . Atrial fibrillation ([place])   . CAD S/P percutaneous coronary angioplasty 09/13/2018    99% p-mLAD (BTW d1&d2) -> SYNERGY DES 3.5 X 12 (3.75 mm). 20% LM.  Cx, small RI & co-dom RCA normal.  EF 65%.   . Dizziness 2012   normal findings..  . Essential hypertension 07/28/2016  . Lower extremity edema  07/28/2016  . NSTEMI (non-ST elevated myocardial infarction) ([place]) 09/13/2018   The patient presented 09/13/2018 with a non-ST elevation MI, troponin peak was 0.15  . Obesity   . Obesity (BMI 30-39.9) 07/28/2016  . OSA (obstructive sleep apnea) 07/28/2016  . Restless leg syndrome    mild  . Shortness of breath 07/28/2016    Past Surgical History:  Procedure Laterality Date  . CORONARY BALLOON ANGIOPLASTY N/A 12/05/2018   Procedure: CORONARY BALLOON ANGIOPLASTY;  Surgeon: [name], MD;  Location: [place] [facility];  Service: Cardiovascular;  Laterality: N/A;  . CORONARY STENT INTERVENTION N/A 09/13/2018   Procedure: CORONARY STENT INTERVENTION;  Surgeon: Belva Crome, MD;  Location: [place] [facility];  Service: Cardiovascular;; p-m LAD (btw D1&D2) - SYNERGY DES 3.5 x 12 (3.75 mm).  Marland Kitchen KNEE ARTHROSCOPY Right   . LEFT HEART CATH AND CORONARY ANGIOGRAPHY N/A 09/13/2018   Procedure: LEFT HEART CATH AND CORONARY ANGIOGRAPHY;  Surgeon: Belva Crome, MD;  Location: [place] [facility];  Service: Cardiovascular;;  99% p-mLAD (BTW d1&d2) -> SYNERGY DES 3.5 X 12 (3.75 mm). 20% LM.  Cx, small RI & co-dom RCA normal.  EF 65%.   Marland Kitchen LEFT HEART CATH AND CORONARY ANGIOGRAPHY  2003   30-40% proximal segmental stenosis in the LAD  . LEFT HEART CATH AND  CORONARY ANGIOGRAPHY  2006   EF >50% & no mitral regurgitation  . LEFT HEART CATH AND CORONARY ANGIOGRAPHY N/A 12/05/2018   Procedure: LEFT HEART CATH AND CORONARY ANGIOGRAPHY;  Surgeon: [name], MD;  Location: [place] [facility];  Service: Cardiovascular;  Laterality: N/A;  . NM MYOVIEW LTD  07/2016   Normal.  EF 55-65% 63%).  No ischemia or infarction.  LOW RISK  . NM MYOVIEW LTD  11/2018   6: 34 min.  7.2 METS.  Noted chest pain and tightness.  Horizontal ST of T wave depression (2 mm) II, 3, V5 and V6 (HIGH RISK), large/severe defect basal-apical inferior, inferolateral wall.  Large-severe basal-apical anterior-anterolateral wall.   Medium/moderate defect in basal and mid inferoseptal wall.  Consistent with large prior infarct with peri-infarct ischemia.  HIGH RISK  . PERICARDIOCENTESIS N/A 12/05/2018   Procedure: PERICARDIOCENTESIS;  Surgeon: [name], MD;  Location: [place] [facility];  Service: Cardiovascular;  Laterality: N/A;  . TRANSTHORACIC ECHOCARDIOGRAM  07/2016   EF 60-65% with mild LVH.  Mild AI.  Trivial MR and TR.   Reviewed with patient.   reports that he has never smoked. He has never used smokeless tobacco. He reports that he does not drink alcohol or use drugs.  Allergies  Allergen Reactions  . Penicillins Hives and Nausea And Vomiting    Did it involve swelling of the face/tongue/throat, SOB, or low BP? No Did it involve sudden or severe rash/hives, skin peeling, or any reaction on the inside of your mouth or nose? No Did you need to seek medical attention at a hospital or doctor's office? No When did it last happen?30 + years If all above answers are "NO", may proceed with cephalosporin use.     Family History  Problem Relation Age of Onset  . Heart failure Mother   . Other Father        bypass surgery  . Heart disease Father   . Heart failure Father   . COPD Father   . Heart attack Sister   . Diabetes Sister   . Heart failure Sister   . Diabetes Brother   . Other Maternal Grandfather        smoking related disease  . Heart attack Paternal Grandmother      Prior to Admission medications   Medication Sig Start Date End Date Taking? Authorizing Provider  Acetaminophen (TYLENOL PO) Take 2 tablets by mouth as needed (help releive pressure in his chest).   Yes [provider]  amitriptyline (ELAVIL) 25 MG tablet Take 25 mg by mouth at bedtime as needed for sleep.   Yes [provider]  atorvastatin (LIPITOR) 80 MG tablet Take 1 tablet (80 mg total) by mouth daily at 6 PM. 09/14/18  Yes Dunn, Areta Haber, PA-C  colchicine 0.6 MG tablet Take 1 tablet (0.6 mg total)  by mouth 2 (two) times daily. 12/08/18  Yes Bhagat, Bhavinkumar, PA  ELIQUIS 5 MG TABS tablet Take 5 mg by mouth 2 (two) times daily. 12/18/18  Yes [provider]  furosemide (LASIX) 20 MG tablet Take 20-40 mg by mouth See admin instructions. Take 40 mg Bid 12/20/18 and 12/21/18. Starting 12/22/18 daily 12/18/18  Yes [provider]  methocarbamol (ROBAXIN) 500 MG tablet Take 500 mg by mouth 3 (three) times daily as needed for muscle spasms.   Yes [provider]  metoprolol succinate (TOPROL-XL) 50 MG 24 hr tablet Take 1 tablet (50 mg total) by mouth  daily. Take with or immediately following a meal. 09/14/18  Yes Dunn, Areta Haber, PA-C  nitroGLYCERIN (NITROSTAT) 0.4 MG SL tablet Place 1 tablet (0.4 mg total) under the tongue every 5 (five) minutes x 3 doses as needed for chest pain. 09/14/18  Yes Dunn, Ryan M, PA-C  pantoprazole (PROTONIX) 40 MG tablet TAKE ONE TABLET BY MOUTH ONE TIME DAILY. must make follow up appointment for further refills Patient taking differently: Take 40 mg by mouth at bedtime.  10/07/14  Yes [name], MD  potassium chloride (K-DUR,KLOR-CON) 10 MEQ tablet Take 10 mEq by mouth daily.   Yes [provider]  ticagrelor (BRILINTA) 90 MG TABS tablet Take 1 tablet (90 mg total) by mouth 2 (two) times daily. 09/14/18  Yes Dunn, Areta Haber, PA-C  hydrochlorothiazide (MICROZIDE) 12.5 MG capsule Take 1 capsule (12.5 mg total) by mouth daily. Patient not taking: Reported on 12/20/2018 09/14/18   Rise Mu, PA-C    Physical Exam:  Constitutional: NAD, calm, comfortable, sitting on side of bed.  Vitals:   12/20/18 1627 12/20/18 1815 12/20/18 1819 12/20/18 1844  BP: 126/84  109/71 121/71  Pulse:  96 98 97  Resp: (!) 22 (!) 22 (!) 23 (!) 24  Temp:    97.6 F (36.4 C)  TempSrc:    Oral  SpO2:  93% 95% 98%   Eyes:  lids and conjunctivae normal ENMT: Mucous membranes are moist. Normal dentition.  Neck: normal, supple, no masses Respiratory: no  wheezing.  Mild crackles at bases, normal work of breathing.  Cardiovascular: Irreg Irreg, tachycardic, no murmur, pulses palpable in radial arteries Abdomen: Obese, NT, +BS, large ecchymosis on right abdomen Musculoskeletal: no clubbing / cyanosis. Normal muscle tone.  Skin: He has a ruddy complexion, appears baseline, large superficial ecchymosis on the right abdomen.  Neurologic: CN 2-12 grossly intact. Sensation intact to light touch. Strength 5/5 in all 4.  Speech normal without slurring.  Memory appears intact.  Psychiatric: Normal judgment and insight. Alert and oriented x 3. Normal mood.    Labs on Admission: I have personally reviewed following labs and imaging studies  CBC: Recent Labs  Lab 12/20/18 1000  WBC 5.7  NEUTROABS 3.5  HGB 13.0  HCT 38.4*  MCV 102.4*  PLT 323   Basic Metabolic Panel: Recent Labs  Lab 12/20/18 1000  NA 137  K 3.7  CL 104  CO2 22  GLUCOSE 115*  BUN 13  CREATININE 1.08  CALCIUM 8.9   GFR: Estimated Creatinine Clearance: 86.3 mL/min (by C-G formula based on SCr of 1.08 mg/dL). Liver Function Tests: Recent Labs  Lab 12/20/18 1000  AST 29  ALT 33  ALKPHOS 85  BILITOT 1.1  PROT 6.6  ALBUMIN 3.2*   No results for input(s): LIPASE, AMYLASE in the last 168 hours. No results for input(s): AMMONIA in the last 168 hours. Coagulation Profile: No results for input(s): INR, PROTIME in the last 168 hours. Cardiac Enzymes: Recent Labs  Lab 12/20/18 1000  TROPONINI 0.03*   BNP (last 3 results) No results for input(s): PROBNP in the last 8760 hours. HbA1C: No results for input(s): HGBA1C in the last 72 hours. CBG: Recent Labs  Lab 12/20/18 1524  GLUCAP 111*   Lipid Profile: No results for input(s): CHOL, HDL, LDLCALC, TRIG, CHOLHDL, LDLDIRECT in the last 72 hours. Thyroid Function Tests: No results for input(s): TSH, T4TOTAL, FREET4, T3FREE, THYROIDAB in the last 72 hours. Anemia Panel: No results for input(s): VITAMINB12,  FOLATE, FERRITIN,  TIBC, IRON, RETICCTPCT in the last 72 hours. Urine analysis:    Component Value Date/Time   COLORURINE STRAW (A) 12/20/2018 1025   APPEARANCEUR CLEAR 12/20/2018 1025   LABSPEC 1.004 (L) 12/20/2018 1025   PHURINE 6.0 12/20/2018 1025   GLUCOSEU NEGATIVE 12/20/2018 1025   HGBUR NEGATIVE 12/20/2018 1025   BILIRUBINUR NEGATIVE 12/20/2018 1025   KETONESUR NEGATIVE 12/20/2018 1025   PROTEINUR NEGATIVE 12/20/2018 1025   UROBILINOGEN 0.2 01/19/2011 1803   NITRITE NEGATIVE 12/20/2018 1025   LEUKOCYTESUR NEGATIVE 12/20/2018 1025    Radiological Exams on Admission: Dg Chest 2 View  Result Date: 12/20/2018 CLINICAL DATA:  Cough, dyspnea and new onset of atrial fibrillation. EXAM: CHEST - 2 VIEW COMPARISON:  Chest x-ray dated 09/12/2018 FINDINGS: There are new small bilateral pleural effusions. There is slight prominence of the pulmonary vascularity on the lateral view. Heart size is within normal limits considering the AP technique. No acute bone abnormality. IMPRESSION: New small bilateral pleural effusions. Prominent pulmonary vascularity demonstrated on the lateral view. Electronically Signed   By: Lorriane Shire M.D.   On: 12/20/2018 11:25   Ct Head Wo Contrast  Result Date: 12/20/2018 CLINICAL DATA:  68 year old male with altered mental status, in atrial fibrillation on arrival. EXAM: CT HEAD WITHOUT CONTRAST TECHNIQUE: Contiguous axial images were obtained from the base of the skull through the vertex without intravenous contrast. COMPARISON:  Orbit radiographs 11/07/2014. FINDINGS: Brain: No midline shift, ventriculomegaly, mass effect, evidence of mass lesion, intracranial hemorrhage or evidence of cortically based acute infarction. Gray-white matter differentiation is within normal limits throughout the brain. Cerebral volume is within normal limits for age. Vascular: Mild Calcified atherosclerosis at the skull base. No suspicious intracranial vascular hyperdensity. Skull:  Negative. Sinuses/Orbits: Paranasal Visualized paranasal sinuses and mastoids are well pneumatized. Other: No acute orbit or scalp soft tissue findings. IMPRESSION: Normal for age non contrast CT appearance of the brain. Electronically Signed   By: Genevie Ann M.D.   On: 12/20/2018 12:07   Mr [name] Head Wo Contrast  Result Date: 12/20/2018 CLINICAL DATA:  68 y/o M; altered mental status and atrial fibrillation on arrival. EXAM: MRI HEAD WITHOUT CONTRAST MRA HEAD WITHOUT CONTRAST TECHNIQUE: Multiplanar, multiecho pulse sequences of the brain and surrounding structures were obtained without intravenous contrast. Angiographic images of the head were obtained using MRA technique without contrast. COMPARISON:  12/20/2018 CT head FINDINGS: MRI HEAD FINDINGS Brain: No acute infarction, hemorrhage, hydrocephalus, extra-axial collection or mass effect. 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 Few punctate nonspecific T2 FLAIR hyperintensities in subcortical and periventricular white matter are compatible with minimal chronic microvascular ischemic changes. 2-3 mm subependymal nodules (4 identified, series 7, image 18-19) of the right lateral ventricle. Vascular: Normal flow voids. Skull and upper cervical spine: Normal marrow signal. Sinuses/Orbits: Negative. Other: None. MRA HEAD FINDINGS Anterior circulation: No large vessel occlusion, aneurysm, or significant stenosis is identified. Posterior circulation: No large vessel occlusion, aneurysm, or significant stenosis is identified. Anatomic variation: None significant. IMPRESSION: 1. No acute intracranial abnormality. 2. Minimal chronic microvascular ischemic changes of white matter. 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 3. 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 4. Normal MRA of the head. Electronically Signed   By:  Kristine Garbe M.D.   On: 12/20/2018 18:22   Mr Brain Wo Contrast  Result Date: 12/20/2018 CLINICAL DATA:  68 y/o M; altered mental status and atrial fibrillation  on arrival. EXAM: MRI HEAD WITHOUT CONTRAST MRA HEAD WITHOUT CONTRAST TECHNIQUE: Multiplanar, multiecho pulse sequences of the brain and surrounding structures were obtained without intravenous contrast. Angiographic images of the head were obtained using MRA technique without contrast. COMPARISON:  12/20/2018 CT head FINDINGS: MRI HEAD FINDINGS Brain: No acute infarction, hemorrhage, hydrocephalus, extra-axial collection or mass effect. 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 Few punctate nonspecific T2 FLAIR hyperintensities in subcortical and periventricular white matter are compatible with minimal chronic microvascular ischemic changes. 2-3 mm subependymal nodules (4 identified, series 7, image 18-19) of the right lateral ventricle. Vascular: Normal flow voids. Skull and upper cervical spine: Normal marrow signal. Sinuses/Orbits: Negative. Other: None. MRA HEAD FINDINGS Anterior circulation: No large vessel occlusion, aneurysm, or significant stenosis is identified. Posterior circulation: No large vessel occlusion, aneurysm, or significant stenosis is identified. Anatomic variation: None significant. IMPRESSION: 1. No acute intracranial abnormality. 2. Minimal chronic microvascular ischemic changes of white matter. 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 3. 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 4. Normal MRA of the head. Electronically Signed   By: Kristine Garbe M.D.   On: 12/20/2018 18:22    EKG: Independently reviewed. Afib  Assessment/Plan  TIA (transient ischemic attack) - In the setting of new onset Afib, recent start of eliquis, TIA seems most likely -  Neurology following - Check A1C and lipid panel - MRI/MRA pending - Limited TTE - EEG  New onset atrial fibrillation - discussed with Dr. [name] in the room - Continue eliquis - Increase metoprolol to 75mg  daily - start dilt ggt if HR not below 100 - Telemetry  Hemorrhagic pericarditis - Repeat TTE, limited - Continue colchicine  CAD S/P percutaneous coronary angioplasty History of non-ST elevation myocardial infarction (NSTEMI) - Change to plavix given need for Eliquis; 300mg  tomorrow AM and then 75mg  - continue statin - other treatment as above    Essential hypertension - Continue BP control with metoprolol, change to 75mg  daily - Add another agent if needed and BP not at goal  OSA (obstructive sleep apnea) - CPAP at night    Hyperlipidemia LDL goal <70 - Continue statin    Macrocytosis with mildly low HCT - Check folate and B12  DVT prophylaxis: Eliquis  Code Status: Full Family Communication: Wife at bedside  Disposition Plan: Admit for further work up  [employer] called: Neurology, Dr. [name] Cardiology  Admission status: Cardiac tele, IP    Gilles Chiquito MD [facility]  If 7PM-7AM, please contact night-coverage [email or web address]  12/20/2018, 7:21 PM

## 2018-12-20 NOTE — Telephone Encounter (Signed)
Pt call stating he's neighbor stop by to visit this morning and report for about 30 sec he was shaking, and disoriented. He reports he took his BP afterwards and it was 162/145 HR 131. He denies headache, facial droop, or slurred speech. Pt states he is currently in route to ED. Pt instructed to continue with plan to report to ED for further evaluations.

## 2018-12-20 NOTE — Consult Note (Signed)
Cardiology Consultation:   Patient ID: Devon Mathews. MRN: [id number]; DOB: May 17, 1951  Admit date: 12/20/2018 Date of Consult: 12/20/2018  Primary Care Provider: Haywood Pao, MD Primary Cardiologist: Skeet Latch, MD  Primary Electrophysiologist:  None   Patient Profile:   Devon Mathews. is a 68 y.o. male with a hx of CAD-PCI with recent hospitalization following PTCA complicated by guidewire perforation (on February 18) who is being seen today for the evaluation of new onset A. fib with possible TIA and CHF at the request of Dr. Gilles Chiquito.  History of Present Illness:   Devon Mathews has a longstanding history of chronic catheterization dating back to 2003 where he had a 40% LAD lesion.  He has had several Myoview was done in the interim there was low risk.  He subsequently had a non-ST elevation MI on September 13, 2018 with minimal troponin elevation and he underwent PCI with a DES stent to the LAD that did jailed diagonal branch. He initially did very well following PCI, but then was seen by Dr. Oval [name] earlier this year and had a Myoview stress test done that revealed anterior ischemia.  He was then seen by me (Dr. [name]) as a work in visit for the abnormal Myoview and was scheduled for cardiac catheterization on February 18. This cath showed a 95% stenosis in the jailed first diagonal branch that was jailed by the DES stent from November.  In the process of doing PTCA of this vessel, the procedure was complicated by distal branch wire perforation from the guidewire that was treated with prolonged balloon inflations to tamponade flow into the very distal portion of the diagonal branch.  There was evidence of pericardial effusion and he was briefly hypotensive.  Failed pericardiocentesis.  He stabilized and did not require pericardial window.  He then was monitored in the hospital for several days post procedure (with expected mild Type IVa -periprocedural MI) and was mostly troubled  by chest pain from the site of the prior attempted pericardiocentesis.  He did also have pericardiac pain and was treated empirically with colchicine.  Follow-up echocardiogram showed very small apical pericardial effusion with normal EF.  He was doing well and saw Kerin Ransom, [place] on February 28.  He was doing very well at that time with no major complaints.  Had some vague chest discomfort that he felt was indigestion and also some discomfort at the site of his attempted pericardiocentesis.  He then called him on the morning of March 2 stating that starting on Saturday, March 29, he started noticing difficulty breathing.  Hard to take a deep breath with lying down.  Having his difficulty despite try to use CPAP mask.  Was having to sit in the upright position.  There was question about it possibly being related to his Brilinta, but also that he was no longer on his diuretic.  Instead of going to emergency room, he actually went to his PCPs office on the morning of March 1.  They did a chest x-ray, added 20 mg Lasix as well as potassium.  They indicated that he was in heart failure.  They also stopped aspirin and started Eliquis but no comment about A. fib (presuming that he was clearly in A. fib at the time of this evaluation.).  He indicated that he tried using caffeine with Brilinta but that did not help.  (The patient's wife indicated that when he was in the PCP office they were told that he was  in atrial fibrillation which is a new diagnosis for him).  My recommendation was for him to take 40 mg Lasix for 2 days then 40 mg daily after that.  We also talked about possibly converting from Eliquis to Plavix -both for respiratory issues and secondary him being started on Eliquis. He stated that he felt much better after the increased diuretic.  Stepped relatively well however on this morning, he felt a little funny and asked his neighbor to come over and sit with him for little bit.  Shortly after the  neighbor arrived he noticed his right hand starting to tremor and he started stuttering his speech and rubbing his head.  His neighbor felt like he may be having a seizure.  The symptom lasted less than then a couple minutes.  No facial droop or slurred speech just stuttering speech and shaking and disorientation.  Following this episode he was noted to be a little bit confused.  He checked his blood pressure and it was 162/95 with a heart rate of 131.  That was the highest it had been both the heart rate and blood pressure all day.  Blood pressures been doing fine but the heart rate has been anywhere from 98-121 all morning long most likely because of A. fib.  Upon arrival to the emergency room, he was feeling much better.  Symptoms are totally resolved, his heart rate was still in the low 100s but blood pressure better. He tells me from a symptomatic standpoint as far as CHF he is not having any more PND orthopnea.  The dyspnea is also notably improved.  He has not had any more chest pain or pressure except occasionally with certain movements at the site of his pericardiocentesis attempt.  He is taking I think 3 or 4 doses of Eliquis having started Monday night. He does not feel his heart rate beating fast or irregular. With exception of this 1 TIA/seizure-like episode, he has not had any syncope or near syncope.  Since increasing his diuretic he has not had any PND orthopnea or significant edema.  No exertional dyspnea.   No melena, hematochezia, epistaxis or hematuria .  No significant bleeding or bruising.  Past Medical History:  Diagnosis Date  . Atrial fibrillation ([place])   . CAD S/P percutaneous coronary angioplasty 09/13/2018    99% p-mLAD (BTW d1&d2) -> SYNERGY DES 3.5 X 12 (3.75 mm). 20% LM.  Cx, small RI & co-dom RCA normal.  EF 65%.   . Dizziness 2012   normal findings..  . Essential hypertension 07/28/2016  . Lower extremity edema 07/28/2016  . NSTEMI (non-ST elevated myocardial  infarction) ([place]) 09/13/2018   The patient presented 09/13/2018 with a non-ST elevation MI, troponin peak was 0.15  . Obesity   . Obesity (BMI 30-39.9) 07/28/2016  . OSA (obstructive sleep apnea) 07/28/2016  . Restless leg syndrome    mild  . Shortness of breath 07/28/2016    Past Surgical History:  Procedure Laterality Date  . CORONARY BALLOON ANGIOPLASTY N/A 12/05/2018   Procedure: CORONARY BALLOON ANGIOPLASTY;  Surgeon: [name], MD;  Location: [place] [facility];  Service: Cardiovascular;  Laterality: N/A;  . CORONARY STENT INTERVENTION N/A 09/13/2018   Procedure: CORONARY STENT INTERVENTION;  Surgeon: Belva Crome, MD;  Location: [place] [facility];  Service: Cardiovascular;; p-m LAD (btw D1&D2) - SYNERGY DES 3.5 x 12 (3.75 mm).  Marland Kitchen KNEE ARTHROSCOPY Right   . LEFT HEART CATH AND CORONARY ANGIOGRAPHY N/A 09/13/2018  Procedure: LEFT HEART CATH AND CORONARY ANGIOGRAPHY;  Surgeon: Belva Crome, MD;  Location: [place] [facility];  Service: Cardiovascular;;  99% p-mLAD (BTW d1&d2) -> SYNERGY DES 3.5 X 12 (3.75 mm). 20% LM.  Cx, small RI & co-dom RCA normal.  EF 65%.   Marland Kitchen LEFT HEART CATH AND CORONARY ANGIOGRAPHY  2003   30-40% proximal segmental stenosis in the LAD  . LEFT HEART CATH AND CORONARY ANGIOGRAPHY  2006   EF >50% & no mitral regurgitation  . LEFT HEART CATH AND CORONARY ANGIOGRAPHY N/A 12/05/2018   Procedure: LEFT HEART CATH AND CORONARY ANGIOGRAPHY;  Surgeon: [name], MD;  Location: [place] [facility];  Service: Cardiovascular;  Laterality: N/A;  . NM MYOVIEW LTD  07/2016   Normal.  EF 55-65% 63%).  No ischemia or infarction.  LOW RISK  . NM MYOVIEW LTD  11/2018   6: 34 min.  7.2 METS.  Noted chest pain and tightness.  Horizontal ST of T wave depression (2 mm) II, 3, V5 and V6 (HIGH RISK), large/severe defect basal-apical inferior, inferolateral wall.  Large-severe basal-apical anterior-anterolateral wall.  Medium/moderate defect in basal and mid inferoseptal  wall.  Consistent with large prior infarct with peri-infarct ischemia.  HIGH RISK  . PERICARDIOCENTESIS N/A 12/05/2018   Procedure: PERICARDIOCENTESIS;  Surgeon: [name], MD;  Location: [facility] [facility];  Service: Cardiovascular;  Laterality: N/A;  . TRANSTHORACIC ECHOCARDIOGRAM  07/2016   EF 60-65% with mild LVH.  Mild AI.  Trivial MR and TR.     Home Medications:  Prior to Admission medications   Medication Sig Start Date End Date Taking? Authorizing Provider  Acetaminophen (TYLENOL PO) Take 2 tablets by mouth as needed (help releive pressure in his chest).   Yes [provider]  amitriptyline (ELAVIL) 25 MG tablet Take 25 mg by mouth at bedtime as needed for sleep.   Yes [provider]  atorvastatin (LIPITOR) 80 MG tablet Take 1 tablet (80 mg total) by mouth daily at 6 PM. 09/14/18  Yes Dunn, Areta Haber, PA-C  colchicine 0.6 MG tablet Take 1 tablet (0.6 mg total) by mouth 2 (two) times daily. 12/08/18  Yes Bhagat, Bhavinkumar, PA  ELIQUIS 5 MG TABS tablet Take 5 mg by mouth 2 (two) times daily. 12/18/18  Yes [provider]  furosemide (LASIX) 20 MG tablet Take 20-40 mg by mouth See admin instructions. Take 40 mg Bid 12/20/18 and 12/21/18. Starting 12/22/18 daily 12/18/18  Yes [provider]  methocarbamol (ROBAXIN) 500 MG tablet Take 500 mg by mouth 3 (three) times daily as needed for muscle spasms.   Yes [provider]  metoprolol succinate (TOPROL-XL) 50 MG 24 hr tablet Take 1 tablet (50 mg total) by mouth daily. Take with or immediately following a meal. 09/14/18  Yes Dunn, Areta Haber, PA-C  nitroGLYCERIN (NITROSTAT) 0.4 MG SL tablet Place 1 tablet (0.4 mg total) under the tongue every 5 (five) minutes x 3 doses as needed for chest pain. 09/14/18  Yes Dunn, Ryan M, PA-C  pantoprazole (PROTONIX) 40 MG tablet TAKE ONE TABLET BY MOUTH ONE TIME DAILY. must make follow up appointment for further refills Patient taking differently: Take 40 mg by mouth  at bedtime.  10/07/14  Yes [name], MD  potassium chloride (K-DUR,KLOR-CON) 10 MEQ tablet Take 10 mEq by mouth daily.   Yes [provider]  ticagrelor (BRILINTA) 90 MG TABS tablet Take 1 tablet (90 mg total) by mouth 2 (two)  times daily. 09/14/18  Yes Dunn, Areta Haber, PA-C  hydrochlorothiazide (MICROZIDE) 12.5 MG capsule Take 1 capsule (12.5 mg total) by mouth daily. Patient not taking: Reported on 12/20/2018 09/14/18   Rise Mu, PA-C   --No longer on HCTZ.  Currently on furosemide 20 mg (actually taking 40 mg a day) He is also currently on Brilinta 90 twice daily with plans to convert to clopidogrel.   Inpatient Medications:   Allergies:    Allergies  Allergen Reactions  . Penicillins Hives and Nausea And Vomiting    Did it involve swelling of the face/tongue/throat, SOB, or low BP? No Did it involve sudden or severe rash/hives, skin peeling, or any reaction on the inside of your mouth or nose? No Did you need to seek medical attention at a hospital or doctor's office? No When did it last happen?30 + years If all above answers are "NO", may proceed with cephalosporin use.     Social History:   Social History   Tobacco Use  . Smoking status: Never Smoker  . Smokeless tobacco: Never Used  Substance Use Topics  . Alcohol use: No  . Drug use: No   Social History   Social History Narrative  . Not on file     Family History:    Family History  Problem Relation Age of Onset  . Heart failure Mother   . Other Father        bypass surgery  . Heart disease Father   . Heart failure Father   . COPD Father   . Heart attack Sister   . Diabetes Sister   . Heart failure Sister   . Diabetes Brother   . Other Maternal Grandfather        smoking related disease  . Heart attack Paternal Grandmother      ROS: Per HPI Review of Systems  Constitutional: Positive for chills (Felt a little bit like this today, but not) and malaise/fatigue (Over the weekend  he felt this way, but also felt a little bit strange this morning). Negative for fever.  Respiratory: Positive for shortness of breath (Also resolved). Negative for cough, sputum production and wheezing.   Cardiovascular: Positive for chest pain (Musculoskeletal pain from pericardiocentesis attempt.  No angina), orthopnea and PND. Negative for leg swelling.       PND and orthopnea per HPI.  Both now resolved  Gastrointestinal: Positive for heartburn. Negative for nausea and vomiting.  Musculoskeletal: Negative for back pain, falls and joint pain.  Skin: Negative.   Neurological: Positive for dizziness, speech change and focal weakness.       TIA symptoms noted above  Psychiatric/Behavioral: Negative.   All other systems reviewed and are negative.   Physical Exam/Data:   Vitals:   12/20/18 1315 12/20/18 1330 12/20/18 1400 12/20/18 1445  BP:      Pulse: 86 (!) 104 100 (!) 103  Resp: (!) 27 16 19 20   Temp:      TempSrc:      SpO2: 97% 97% 98% 98%    Intake/Output Summary (Last 24 hours) at 12/20/2018 1640 Last data filed at 12/20/2018 1025 Gross per 24 hour  Intake -  Output 275 ml  Net -275 ml   Last 3 Weights 12/15/2018 12/08/2018 12/07/2018  Weight (lbs) 266 lb 265 lb 12.8 oz 267 lb 11.2 oz  Weight (kg) 120.657 kg 120.566 kg 121.428 kg     There is no height or weight on file to calculate BMI.  General:  Well nourished, well developed, in no acute distress.  Essentially morbidly obese HEENT: [place]/AT/EOMI Lymph: no LAN Neck: no JVD or carotid bruit Endocrine:  No thryomegaly Vascular: No carotid bruits; FA pulses 2+ bilaterally without bruits  Cardiac:  normal S1, S2; RRR; no murmur rub or gallop.  Mild tenderness in the epigastric/subxiphoid space. Lungs:  clear to auscultation bilaterally, no wheezing, rhonchi or rales.  Nonlabored Abd: soft, nontender, no hepatomegaly.  Obese Ext: no clubbing/cyanosis or edema Musculoskeletal:  No deformities, BUE and BLE strength normal and  equal Skin: warm and dry  Neuro:  CNs 2-12 intact, no focal abnormalities noted Psych:  Normal mood and affect    EKG:  The EKG was personally reviewed and demonstrates: A. fib with RVR rate 144 bpm.  Diffuse mild subtle nonspecific changes.  Previous ST elevation no longer noted Telemetry:  Telemetry was personally reviewed and demonstrates: A. fib rates from 98 to 121 bpm  Relevant CV Studies:   Cardiac Cath having PCI with complication December 05, 2018  SUMMARY  Culprit lesion:   90% ostial 1st Diag (jailed) -successful scoring balloon angioplasty using 2.5 mm Wolverine scoring balloon  --Distal diagonal wire perforation with pericardial effusion and initial stages of pericardial tamponade --> successful occlusion with prolonged balloon inflations and administration of protamine  --Unsuccessful pericardiocentesis  Widely patent LAD stent  Otherwise stable coronary arteries.  Normal LVEF and EDP pre-PCI  Clear ST elevations for roughly 15 minutes, most likely type for MI.   RECOMMENDATIONS  Admit to CCU on Levophed --continue Levophed overnight  Monitor closely for signs of tamponade.  Follow troponins for likely type IV MI  Stat echocardiogram ordered.  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   Final Echo 12/06/2018:   1. The left ventricle has normal systolic function, with an ejection fraction of 55-60%. The cavity size was normal. Left ventricular diastolic parameters were normal.  2. Small pericardial effusion seen at the apex, lateral wall and anterior to RV.  3. The mitral valve is degenerative. Mild thickening of the mitral valve leaflet. Mild calcification of the mitral valve leaflet.  4. The aortic valve is tricuspid There is Moderate thickening of the aortic valve There is Mild calcification of the aortic valveAortic  valve regurgitation is trivial by color flow Doppler.   Total time was Laboratory Data:  Chemistry Recent Labs  Lab 12/20/18 1000  NA 137  K 3.7  CL 104  CO2 22  GLUCOSE 115*  BUN 13  CREATININE 1.08  CALCIUM 8.9  GFRNONAA >60  GFRAA >60  ANIONGAP 11    Recent Labs  Lab 12/20/18 1000  PROT 6.6  ALBUMIN 3.2*  AST 29  ALT 33  ALKPHOS 85  BILITOT 1.1   Hematology Recent Labs  Lab 12/20/18 1000  WBC 5.7  RBC 3.75*  HGB 13.0  HCT 38.4*  MCV 102.4*  MCH 34.7*  MCHC 33.9  RDW 13.7  PLT 265   Cardiac Enzymes Recent Labs  Lab 12/20/18 1000  TROPONINI 0.03*   No results for input(s): TROPIPOC in the last 168 hours.  BNP Recent Labs  Lab 12/20/18 1000  BNP 209.7*    DDimer No results for input(s): DDIMER in the last 168 hours.  Radiology/Studies:  Dg Chest 2 View  Result Date: 12/20/2018 CLINICAL DATA:  Cough, dyspnea and new onset of atrial fibrillation.  EXAM: CHEST - 2 VIEW COMPARISON:  Chest x-ray dated 09/12/2018 FINDINGS: There are new small bilateral pleural effusions. There is slight prominence of the pulmonary vascularity on the lateral view. Heart size is within normal limits considering the AP technique. No acute bone abnormality. IMPRESSION: New small bilateral pleural effusions. Prominent pulmonary vascularity demonstrated on the lateral view. Electronically Signed   By: Lorriane Shire M.D.   On: 12/20/2018 11:25   Ct Head Wo Contrast  Result Date: 12/20/2018 CLINICAL DATA:  68 year old male with altered mental status, in atrial fibrillation on arrival. EXAM: CT HEAD WITHOUT CONTRAST TECHNIQUE: Contiguous axial images were obtained from the base of the skull through the vertex without intravenous contrast. COMPARISON:  Orbit radiographs 11/07/2014. FINDINGS: Brain: No midline shift, ventriculomegaly, mass effect, evidence of mass lesion, intracranial hemorrhage or evidence of cortically based acute infarction. Gray-white matter differentiation is  within normal limits throughout the brain. Cerebral volume is within normal limits for age. Vascular: Mild Calcified atherosclerosis at the skull base. No suspicious intracranial vascular hyperdensity. Skull: Negative. Sinuses/Orbits: Paranasal Visualized paranasal sinuses and mastoids are well pneumatized. Other: No acute orbit or scalp soft tissue findings. IMPRESSION: Normal for age non contrast CT appearance of the brain. Electronically Signed   By: Genevie Ann M.D.   On: 12/20/2018 12:07    Assessment and Plan:   Principal Problem:   TIA (transient ischemic attack) Active Problems:   CAD S/P percutaneous coronary angioplasty   New onset atrial fibrillation ([facility])   Essential hypertension   Lower extremity edema   Shortness of breath   OSA (obstructive sleep apnea)   History of non-ST elevation myocardial infarction (NSTEMI)   Hyperlipidemia LDL goal <70   Recent bout of Acute on chronic DHF complicated by new Dx Afib  Recently started on Eliquis (only 3 doses PTA) --> ASA d/c'x  TIA & CHF Sx resolved.   Recommend - Change from Brilinta 90 mg BID to Plavix -- dose 300 mg Plavix in AM & 75 mg daily after  Check limited 2 D Echo -- need to reassess effusion & EF  Continue Eliquis --> would like to have ~4 weeks of coverage & consider DCCV (with current ? TIA Sx, not good timing for TEE-DCCV).  No ASA  HCTZ was d/c'd from Med list =-> now on Lasix 20 mg daily with additional 20 mg PRN based on Sliding Scale (for egt gain >3 lb in 1 day). - would continue at least 20 mg daily  Increase Beta Blocker dose - try 75 mg Toprol & use Diltiazem gtt if HR remains > 100 (can convert to PO once stable).  Continue Colchcine.  Continue Statin. Marland Kitchen  Recommendations d/w TRH MD.      For questions or updates, please contact [facility] Please consult [email or web address] for contact info under     Signed, Glenetta Hew, MD  12/20/2018 4:40 PM

## 2018-12-20 NOTE — ED Notes (Signed)
ED TO INPATIENT HANDOFF REPORT  ED Nurse Name and Phone #: Davene Costain [phone]  S Name/Age/Gender Devon Mathews. 68 y.o. male Room/Bed: 026C/026C  Code Status   Code Status: Prior  Home/SNF/Other Home Patient oriented to: self, place, time and situation Is this baseline? Yes   Triage Complete: Triage complete  Chief Complaint hbp/ams/poss high hrt rate/hx of chf  Triage Note Pt arrives to ED from home with complaints of with one minute of AMS today that his neighbor observed. Pts neighbor said that the pt's speech was "confusing".  Pt denies any weakness, CP, or SOB. Pt is in A-fib on arrival.    Allergies Allergies  Allergen Reactions  . Penicillins Hives and Nausea And Vomiting    Did it involve swelling of the face/tongue/throat, SOB, or low BP? No Did it involve sudden or severe rash/hives, skin peeling, or any reaction on the inside of your mouth or nose? No Did you need to seek medical attention at a hospital or doctor's office? No When did it last happen?30 + years If all above answers are "NO", may proceed with cephalosporin use.     Level of Care/Admitting Diagnosis ED Disposition    ED Disposition Condition Comment   Admit  Hospital Area: [facility] [[id number]]  Level of Care: Cardiac Telemetry [103]  Diagnosis: TIA (transient ischemic attack) [[id number]]  Admitting Physician: Sid Falcon [phone]  Attending Physician: Sid Falcon [phone]  Estimated length of stay: past midnight tomorrow  Certification:: I certify this patient will need inpatient services for at least 2 midnights  PT Class (Do Not Modify): Inpatient [101]  PT Acc Code (Do Not Modify): Private [1]       B Medical/Surgery History Past Medical History:  Diagnosis Date  . Atrial fibrillation ([place])   . CAD S/P percutaneous coronary angioplasty 09/13/2018    99% p-mLAD (BTW d1&d2) -> SYNERGY DES 3.5 X 12 (3.75 mm). 20% LM.  Cx, small RI & co-dom RCA normal.  EF 65%.    . Dizziness 2012   normal findings..  . Essential hypertension 07/28/2016  . Lower extremity edema 07/28/2016  . NSTEMI (non-ST elevated myocardial infarction) ([place]) 09/13/2018   The patient presented 09/13/2018 with a non-ST elevation MI, troponin peak was 0.15  . Obesity   . Obesity (BMI 30-39.9) 07/28/2016  . OSA (obstructive sleep apnea) 07/28/2016  . Restless leg syndrome    mild  . Shortness of breath 07/28/2016   Past Surgical History:  Procedure Laterality Date  . CORONARY BALLOON ANGIOPLASTY N/A 12/05/2018   Procedure: CORONARY BALLOON ANGIOPLASTY;  Surgeon: [name], MD;  Location: [facility];  Service: Cardiovascular;  Laterality: N/A;  . CORONARY STENT INTERVENTION N/A 09/13/2018   Procedure: CORONARY STENT INTERVENTION;  Surgeon: Belva Crome, MD;  Location: [facility];  Service: Cardiovascular;; p-m LAD (btw D1&D2) - SYNERGY DES 3.5 x 12 (3.75 mm).  Marland Kitchen KNEE ARTHROSCOPY Right   . LEFT HEART CATH AND CORONARY ANGIOGRAPHY N/A 09/13/2018   Procedure: LEFT HEART CATH AND CORONARY ANGIOGRAPHY;  Surgeon: Belva Crome, MD;  Location: [facility];  Service: Cardiovascular;;  99% p-mLAD (BTW d1&d2) -> SYNERGY DES 3.5 X 12 (3.75 mm). 20% LM.  Cx, small RI & co-dom RCA normal.  EF 65%.   Marland Kitchen LEFT HEART CATH AND CORONARY ANGIOGRAPHY  2003   30-40% proximal segmental stenosis in the LAD  . LEFT HEART CATH AND CORONARY ANGIOGRAPHY  2006  EF >50% & no mitral regurgitation  . LEFT HEART CATH AND CORONARY ANGIOGRAPHY N/A 12/05/2018   Procedure: LEFT HEART CATH AND CORONARY ANGIOGRAPHY;  Surgeon: [name], MD;  Location: [facility];  Service: Cardiovascular;  Laterality: N/A;  . NM MYOVIEW LTD  07/2016   Normal.  EF 55-65% 63%).  No ischemia or infarction.  LOW RISK  . NM MYOVIEW LTD  11/2018   6: 34 min.  7.2 METS.  Noted chest pain and tightness.  Horizontal ST of T wave depression (2 mm) II, 3, V5 and V6 (HIGH RISK), large/severe defect  basal-apical inferior, inferolateral wall.  Large-severe basal-apical anterior-anterolateral wall.  Medium/moderate defect in basal and mid inferoseptal wall.  Consistent with large prior infarct with peri-infarct ischemia.  HIGH RISK  . PERICARDIOCENTESIS N/A 12/05/2018   Procedure: PERICARDIOCENTESIS;  Surgeon: [name], MD;  Location: [facility];  Service: Cardiovascular;  Laterality: N/A;  . TRANSTHORACIC ECHOCARDIOGRAM  07/2016   EF 60-65% with mild LVH.  Mild AI.  Trivial MR and TR.     A IV Location/Drains/Wounds Patient Lines/Drains/Airways Status   Active Line/Drains/Airways    Name:   Placement date:   Placement time:   Site:   Days:   Peripheral IV 12/05/18 Left Antecubital   12/05/18    0800    Antecubital   15   Peripheral IV 12/20/18 Right Antecubital   12/20/18    1016    Antecubital   less than 1   Wound / Incision (Open or Dehisced) 12/05/18 Puncture Abdomen Medial;Upper   12/05/18    2000    Abdomen   15          Intake/Output Last 24 hours  Intake/Output Summary (Last 24 hours) at 12/20/2018 1639 Last data filed at 12/20/2018 1025 Gross per 24 hour  Intake -  Output 275 ml  Net -275 ml    Labs/Imaging Results for orders placed or performed during the hospital encounter of 12/20/18 (from the past 48 hour(s))  Troponin I - Once     Status: Abnormal   Collection Time: 12/20/18 10:00 AM  Result Value Ref Range   Troponin I 0.03 (HH) <0.03 ng/mL    Comment: CRITICAL RESULT CALLED TO, READ BACK BY AND VERIFIED WITH: Shelly Rubenstein, RN AT 1119 ON 12/19/2018 BY SAINVILUS S CORRECT CALL DATE  12/20/2018 Performed at [facility], [place] [address], [place], [place] [address] CORRECTED ON 03/04 AT 1414: PREVIOUSLY REPORTED AS 0.03 CRITICAL RESULT CALLED TO, READ BACK BY AND VERIFIED WITH: Shelly Rubenstein, RN AT 1119 ON 12/19/2018 BY SAINVILUS S   Brain natriuretic peptide     Status: Abnormal   Collection Time: 12/20/18 10:00 AM  Result Value Ref Range   B  Natriuretic Peptide 209.7 (H) 0.0 - 100.0 pg/mL    Comment: Performed at [facility], [address], [place], [place] [address]  Comprehensive metabolic panel     Status: Abnormal   Collection Time: 12/20/18 10:00 AM  Result Value Ref Range   Sodium 137 135 - 145 mmol/L   Potassium 3.7 3.5 - 5.1 mmol/L   Chloride 104 98 - 111 mmol/L   CO2 22 22 - 32 mmol/L   Glucose, Bld 115 (H) 70 - 99 mg/dL   BUN 13 8 - 23 mg/dL   Creatinine, Ser 1.08 0.61 - 1.24 mg/dL   Calcium 8.9 8.9 - 10.3 mg/dL   Total Protein 6.6 6.5 - 8.1  g/dL   Albumin 3.2 (L) 3.5 - 5.0 g/dL   AST 29 15 - 41 U/L   ALT 33 0 - 44 U/L   Alkaline Phosphatase 85 38 - 126 U/L   Total Bilirubin 1.1 0.3 - 1.2 mg/dL   GFR calc non Af Amer >60 >60 mL/min   GFR calc Af Amer >60 >60 mL/min   Anion gap 11 5 - 15    Comment: Performed at [facility] [address], [place], [place] [address]  CBC with Differential     Status: Abnormal   Collection Time: 12/20/18 10:00 AM  Result Value Ref Range   WBC 5.7 4.0 - 10.5 K/uL   RBC 3.75 (L) 4.22 - 5.81 MIL/uL   Hemoglobin 13.0 13.0 - 17.0 g/dL   HCT 38.4 (L) 39.0 - 52.0 %   MCV 102.4 (H) 80.0 - 100.0 fL   MCH 34.7 (H) 26.0 - 34.0 pg   MCHC 33.9 30.0 - 36.0 g/dL   RDW 13.7 11.5 - 15.5 %   Platelets 265 150 - 400 K/uL   nRBC 0.0 0.0 - 0.2 %   Neutrophils Relative % 60 %   Neutro Abs 3.5 1.7 - 7.7 K/uL   Lymphocytes Relative 24 %   Lymphs Abs 1.4 0.7 - 4.0 K/uL   Monocytes Relative 11 %   Monocytes Absolute 0.6 0.1 - 1.0 K/uL   Eosinophils Relative 3 %   Eosinophils Absolute 0.2 0.0 - 0.5 K/uL   Basophils Relative 1 %   Basophils Absolute 0.0 0.0 - 0.1 K/uL   Immature Granulocytes 1 %   Abs Immature Granulocytes 0.04 0.00 - 0.07 K/uL    Comment: Performed at [facility] [address], [place], [place] [address]  Urinalysis, Routine w reflex microscopic     Status: Abnormal   Collection Time: 12/20/18 10:25 AM  Result Value Ref Range   Color, Urine STRAW  (A) YELLOW   APPearance CLEAR CLEAR   Specific Gravity, Urine 1.004 (L) 1.005 - 1.030   pH 6.0 5.0 - 8.0   Glucose, UA NEGATIVE NEGATIVE mg/dL   Hgb urine dipstick NEGATIVE NEGATIVE   Bilirubin Urine NEGATIVE NEGATIVE   Ketones, ur NEGATIVE NEGATIVE mg/dL   Protein, ur NEGATIVE NEGATIVE mg/dL   Nitrite NEGATIVE NEGATIVE   Leukocytes,Ua NEGATIVE NEGATIVE    Comment: Performed at [facility] [address], [place], [place] [address]  Urine rapid drug screen (hosp performed)     Status: None   Collection Time: 12/20/18 10:25 AM  Result Value Ref Range   Opiates NONE DETECTED NONE DETECTED   Cocaine NONE DETECTED NONE DETECTED   Benzodiazepines NONE DETECTED NONE DETECTED   Amphetamines NONE DETECTED NONE DETECTED   Tetrahydrocannabinol NONE DETECTED NONE DETECTED   Barbiturates NONE DETECTED NONE DETECTED    Comment: (NOTE) DRUG SCREEN FOR MEDICAL PURPOSES ONLY.  IF CONFIRMATION IS NEEDED FOR ANY PURPOSE, NOTIFY LAB WITHIN 5 DAYS. LOWEST DETECTABLE LIMITS FOR URINE DRUG SCREEN Drug Class                     Cutoff (ng/mL) Amphetamine and metabolites    1000 Barbiturate and metabolites    200 Benzodiazepine                 735 Tricyclics and metabolites     300 Opiates and metabolites        300 Cocaine and metabolites  300 THC                            50 Performed at [facility], [place] [address], [place], [place] [address]   CBG monitoring, ED     Status: Abnormal   Collection Time: 12/20/18  3:24 PM  Result Value Ref Range   Glucose-Capillary 111 (H) 70 - 99 mg/dL   Dg Chest 2 View  Result Date: 12/20/2018 CLINICAL DATA:  Cough, dyspnea and new onset of atrial fibrillation. EXAM: CHEST - 2 VIEW COMPARISON:  Chest x-ray dated 09/12/2018 FINDINGS: There are new small bilateral pleural effusions. There is slight prominence of the pulmonary vascularity on the lateral view. Heart size is within normal limits considering the AP technique. No acute bone  abnormality. IMPRESSION: New small bilateral pleural effusions. Prominent pulmonary vascularity demonstrated on the lateral view. Electronically Signed   By: Lorriane Shire M.D.   On: 12/20/2018 11:25   Ct Head Wo Contrast  Result Date: 12/20/2018 CLINICAL DATA:  68 year old male with altered mental status, in atrial fibrillation on arrival. EXAM: CT HEAD WITHOUT CONTRAST TECHNIQUE: Contiguous axial images were obtained from the base of the skull through the vertex without intravenous contrast. COMPARISON:  Orbit radiographs 11/07/2014. FINDINGS: Brain: No midline shift, ventriculomegaly, mass effect, evidence of mass lesion, intracranial hemorrhage or evidence of cortically based acute infarction. Gray-white matter differentiation is within normal limits throughout the brain. Cerebral volume is within normal limits for age. Vascular: Mild Calcified atherosclerosis at the skull base. No suspicious intracranial vascular hyperdensity. Skull: Negative. Sinuses/Orbits: Paranasal Visualized paranasal sinuses and mastoids are well pneumatized. Other: No acute orbit or scalp soft tissue findings. IMPRESSION: Normal for age non contrast CT appearance of the brain. Electronically Signed   By: Genevie Ann M.D.   On: 12/20/2018 12:07    Pending Labs [employer] (From admission, onward)    Start     Ordered   Signed and Held  Hemoglobin A1c  Tomorrow morning,   R     Signed and Held   Signed and Held  Lipid panel  Tomorrow morning,   R    Comments:  Fasting    Signed and Held          Vitals/Pain Today's Vitals   12/20/18 1315 12/20/18 1330 12/20/18 1400 12/20/18 1445  BP:      Pulse: 86 (!) 104 100 (!) 103  Resp: (!) 27 16 19 20   Temp:      TempSrc:      SpO2: 97% 97% 98% 98%  PainSc:        Isolation Precautions No active isolations  Medications Medications  diltiazem (CARDIZEM) injection 20 mg (20 mg Intravenous Given 12/20/18 1021)  furosemide (LASIX) injection 40 mg (40 mg Intravenous  Given 12/20/18 1402)  LORazepam (ATIVAN) injection 2 mg (2 mg Intravenous Given 12/20/18 1633)    Mobility walks Low fall risk   Focused Assessments Cardiac Assessment Handoff:    Lab Results  Component Value Date   TROPONINI 0.03 ([place]) 12/20/2018   No results found for: DDIMER Does the Patient currently have chest pain? No     R Recommendations: See Admitting Provider Note  Report given to:   Additional Notes: Pt in Afib RVR and given cardizem, possible TIA, BNP of 209

## 2018-12-20 NOTE — Progress Notes (Signed)
EEG Completed; Results Pending  

## 2018-12-20 NOTE — Telephone Encounter (Signed)
° ° °  Patient calling to report BP 162/145 HR 131

## 2018-12-20 NOTE — ED Provider Notes (Signed)
[facility] EMERGENCY DEPARTMENT Provider Note   CSN: [id number] Arrival date & time: 12/20/18  8891    History   Chief Complaint Chief Complaint  Patient presents with  . Altered Mental Status    HPI Devon Erion. is a 68 y.o. male.     HPI  68 year old male presents with a chief complaint of an episode of confusion.  Patient states this morning he was checking his blood pressure and heart rate as he was recently diagnosed with A. fib by his PCP.  He had a friend come over to talk.   While his friend was there he had about 30 seconds of confusion where he felt disoriented and was picking at his face, thinking he had his CPAP mask on, which he did not.  He also states his right hand/arm was shaking.  This spontaneously resolved and he feels fine now.  Patient was recently put on Eliquis for the new A. fib.  He does not feel palpitations.  Over the last 4 days he is been having cough and dyspnea and was told he had fluid in his lungs.  He was told to increase his fluid pill at home.  He has been taking all of his meds compliantly.  No headache, dizziness or sweating this morning.  No current chest pain.  Past Medical History:  Diagnosis Date  . Atrial fibrillation ([place])   . CAD S/P percutaneous coronary angioplasty 09/13/2018    99% p-mLAD (BTW d1&d2) -> SYNERGY DES 3.5 X 12 (3.75 mm). 20% LM.  Cx, small RI & co-dom RCA normal.  EF 65%.   . Dizziness 2012   normal findings..  . Essential hypertension 07/28/2016  . Lower extremity edema 07/28/2016  . NSTEMI (non-ST elevated myocardial infarction) ([place]) 09/13/2018   The patient presented 09/13/2018 with a non-ST elevation MI, troponin peak was 0.15  . Obesity   . Obesity (BMI 30-39.9) 07/28/2016  . OSA (obstructive sleep apnea) 07/28/2016  . Restless leg syndrome    mild  . Shortness of breath 07/28/2016    Patient Active Problem List   Diagnosis Date Noted  . Abnormal nuclear stress test 12/02/2018  .  Pre-op testing 12/02/2018  . Hyperlipidemia LDL goal <70 09/14/2018  . CAD S/P percutaneous coronary angioplasty 09/13/2018  . History of non-ST elevation myocardial infarction (NSTEMI) 09/13/2018  . Angina, class II ([place]) 07/28/2016  . Essential hypertension 07/28/2016  . Obesity (BMI 30-39.9) 07/28/2016  . Lower extremity edema 07/28/2016  . Shortness of breath 07/28/2016  . OSA (obstructive sleep apnea) 07/28/2016    Past Surgical History:  Procedure Laterality Date  . CORONARY BALLOON ANGIOPLASTY N/A 12/05/2018   Procedure: CORONARY BALLOON ANGIOPLASTY;  Surgeon: [name], MD;  Location: [facility];  Service: Cardiovascular;  Laterality: N/A;  . CORONARY STENT INTERVENTION N/A 09/13/2018   Procedure: CORONARY STENT INTERVENTION;  Surgeon: Belva Crome, MD;  Location: [facility];  Service: Cardiovascular;; p-m LAD (btw D1&D2) - SYNERGY DES 3.5 x 12 (3.75 mm).  Marland Kitchen KNEE ARTHROSCOPY Right   . LEFT HEART CATH AND CORONARY ANGIOGRAPHY N/A 09/13/2018   Procedure: LEFT HEART CATH AND CORONARY ANGIOGRAPHY;  Surgeon: Belva Crome, MD;  Location: [facility];  Service: Cardiovascular;;  99% p-mLAD (BTW d1&d2) -> SYNERGY DES 3.5 X 12 (3.75 mm). 20% LM.  Cx, small RI & co-dom RCA normal.  EF 65%.   Marland Kitchen LEFT HEART CATH AND CORONARY ANGIOGRAPHY  2003  30-40% proximal segmental stenosis in the LAD  . LEFT HEART CATH AND CORONARY ANGIOGRAPHY  2006   EF >50% & no mitral regurgitation  . LEFT HEART CATH AND CORONARY ANGIOGRAPHY N/A 12/05/2018   Procedure: LEFT HEART CATH AND CORONARY ANGIOGRAPHY;  Surgeon: [name], MD;  Location: [facility];  Service: Cardiovascular;  Laterality: N/A;  . NM MYOVIEW LTD  07/2016   Normal.  EF 55-65% 63%).  No ischemia or infarction.  LOW RISK  . NM MYOVIEW LTD  11/2018   6: 34 min.  7.2 METS.  Noted chest pain and tightness.  Horizontal ST of T wave depression (2 mm) II, 3, V5 and V6 (HIGH RISK), large/severe defect  basal-apical inferior, inferolateral wall.  Large-severe basal-apical anterior-anterolateral wall.  Medium/moderate defect in basal and mid inferoseptal wall.  Consistent with large prior infarct with peri-infarct ischemia.  HIGH RISK  . PERICARDIOCENTESIS N/A 12/05/2018   Procedure: PERICARDIOCENTESIS;  Surgeon: [name], MD;  Location: [facility];  Service: Cardiovascular;  Laterality: N/A;  . TRANSTHORACIC ECHOCARDIOGRAM  07/2016   EF 60-65% with mild LVH.  Mild AI.  Trivial MR and TR.        Home Medications    Prior to Admission medications   Medication Sig Start Date End Date Taking? Authorizing Provider  Acetaminophen (TYLENOL PO) Take 2 tablets by mouth as needed (help releive pressure in his chest).   Yes [provider]  amitriptyline (ELAVIL) 25 MG tablet Take 25 mg by mouth at bedtime as needed for sleep.   Yes [provider]  atorvastatin (LIPITOR) 80 MG tablet Take 1 tablet (80 mg total) by mouth daily at 6 PM. 09/14/18  Yes Dunn, Areta Haber, PA-C  colchicine 0.6 MG tablet Take 1 tablet (0.6 mg total) by mouth 2 (two) times daily. 12/08/18  Yes Bhagat, Bhavinkumar, PA  ELIQUIS 5 MG TABS tablet Take 5 mg by mouth 2 (two) times daily. 12/18/18  Yes [provider]  furosemide (LASIX) 20 MG tablet Take 20-40 mg by mouth See admin instructions. Take 40 mg Bid 12/20/18 and 12/21/18. Starting 12/22/18 daily 12/18/18  Yes [provider]  methocarbamol (ROBAXIN) 500 MG tablet Take 500 mg by mouth 3 (three) times daily as needed for muscle spasms.   Yes [provider]  metoprolol succinate (TOPROL-XL) 50 MG 24 hr tablet Take 1 tablet (50 mg total) by mouth daily. Take with or immediately following a meal. 09/14/18  Yes Dunn, Areta Haber, PA-C  nitroGLYCERIN (NITROSTAT) 0.4 MG SL tablet Place 1 tablet (0.4 mg total) under the tongue every 5 (five) minutes x 3 doses as needed for chest pain. 09/14/18  Yes Dunn, Ryan M, PA-C  pantoprazole  (PROTONIX) 40 MG tablet TAKE ONE TABLET BY MOUTH ONE TIME DAILY. must make follow up appointment for further refills Patient taking differently: Take 40 mg by mouth at bedtime.  10/07/14  Yes [name], MD  potassium chloride (K-DUR,KLOR-CON) 10 MEQ tablet Take 10 mEq by mouth daily.   Yes [provider]  ticagrelor (BRILINTA) 90 MG TABS tablet Take 1 tablet (90 mg total) by mouth 2 (two) times daily. 09/14/18  Yes Dunn, Areta Haber, PA-C  hydrochlorothiazide (MICROZIDE) 12.5 MG capsule Take 1 capsule (12.5 mg total) by mouth daily. Patient not taking: Reported on 12/20/2018 09/14/18   Rise Mu, PA-C    Family History Family History  Problem Relation Age of Onset  . Heart failure Mother   .  Other Father        bypass surgery  . Heart disease Father   . Heart failure Father   . COPD Father   . Heart attack Sister   . Diabetes Sister   . Heart failure Sister   . Diabetes Brother   . Other Maternal Grandfather        smoking related disease  . Heart attack Paternal Grandmother     Social History Social History   Tobacco Use  . Smoking status: Never Smoker  . Smokeless tobacco: Never Used  Substance Use Topics  . Alcohol use: No  . Drug use: No     Allergies   Penicillins   Review of Systems Review of Systems  Constitutional: Negative for fever.  Respiratory: Positive for cough and shortness of breath.   Cardiovascular: Positive for leg swelling. Negative for chest pain.  Neurological: Negative for dizziness, weakness, numbness and headaches.  Psychiatric/Behavioral: Positive for confusion and hallucinations.  All other systems reviewed and are negative.    Physical Exam Updated Vital Signs BP 134/90 (BP Location: Right Arm)   Pulse (!) 103   Temp 98.2 F (36.8 C) (Oral)   Resp 20   SpO2 98%   Physical Exam Vitals signs and nursing note reviewed.  Constitutional:      General: He is not in acute distress.    Appearance: He is well-developed.  He is obese. He is not ill-appearing or diaphoretic.  HENT:     Head: Normocephalic and atraumatic.     Right Ear: External ear normal.     Left Ear: External ear normal.     Nose: Nose normal.  Eyes:     General:        Right eye: No discharge.        Left eye: No discharge.     Extraocular Movements: Extraocular movements intact.     Pupils: Pupils are equal, round, and reactive to light.  Neck:     Musculoskeletal: Neck supple.  Cardiovascular:     Rate and Rhythm: Tachycardia present. Rhythm irregular.     Heart sounds: Normal heart sounds.  Pulmonary:     Effort: Pulmonary effort is normal.     Breath sounds: Rales present.     Comments: Minimal bibasilar rales Abdominal:     Palpations: Abdomen is soft.     Tenderness: There is no abdominal tenderness.  Skin:    General: Skin is warm and dry.  Neurological:     Mental Status: He is alert and oriented to person, place, and time.     Comments: CN 3-12 grossly intact. 5/5 strength in all 4 extremities. Grossly normal sensation. Normal finger to nose.   Psychiatric:        Mood and Affect: Mood is not anxious.      ED Treatments / Results  Labs (all labs ordered are listed, but only abnormal results are displayed) Labs Reviewed  URINALYSIS, ROUTINE W REFLEX MICROSCOPIC - Abnormal; Notable for the following components:      Result Value   Color, Urine STRAW (*)    Specific Gravity, Urine 1.004 (*)    All other components within normal limits  TROPONIN I - Abnormal; Notable for the following components:   Troponin I 0.03 (*)    All other components within normal limits  BRAIN NATRIURETIC PEPTIDE - Abnormal; Notable for the following components:   B Natriuretic Peptide 209.7 (*)    All other components within normal limits  COMPREHENSIVE METABOLIC PANEL - Abnormal; Notable for the following components:   Glucose, Bld 115 (*)    Albumin 3.2 (*)    All other components within normal limits  CBC WITH  DIFFERENTIAL/PLATELET - Abnormal; Notable for the following components:   RBC 3.75 (*)    HCT 38.4 (*)    MCV 102.4 (*)    MCH 34.7 (*)    All other components within normal limits  RAPID URINE DRUG SCREEN, HOSP PERFORMED  CBG MONITORING, ED    EKG EKG Interpretation  Date/Time:  Wednesday December 20 2018 10:17:41 EST Ventricular Rate:  124 PR Interval:    QRS Duration: 87 QT Interval:  344 QTC Calculation: 453 R Axis:   62 Text Interpretation:  Atrial fibrillation with RVR Paired ventricular premature complexes Borderline T abnormalities, diffuse leads Baseline wander in lead(s) V3 V5 Afib new since Feb 2020 STEMI no longer present Confirmed by Sherwood Gambler [phone]) on 12/20/2018 10:36:43 AM   Radiology Dg Chest 2 View  Result Date: 12/20/2018 CLINICAL DATA:  Cough, dyspnea and new onset of atrial fibrillation. EXAM: CHEST - 2 VIEW COMPARISON:  Chest x-ray dated 09/12/2018 FINDINGS: There are new small bilateral pleural effusions. There is slight prominence of the pulmonary vascularity on the lateral view. Heart size is within normal limits considering the AP technique. No acute bone abnormality. IMPRESSION: New small bilateral pleural effusions. Prominent pulmonary vascularity demonstrated on the lateral view. Electronically Signed   By: Lorriane Shire M.D.   On: 12/20/2018 11:25   Ct Head Wo Contrast  Result Date: 12/20/2018 CLINICAL DATA:  68 year old male with altered mental status, in atrial fibrillation on arrival. EXAM: CT HEAD WITHOUT CONTRAST TECHNIQUE: Contiguous axial images were obtained from the base of the skull through the vertex without intravenous contrast. COMPARISON:  Orbit radiographs 11/07/2014. FINDINGS: Brain: No midline shift, ventriculomegaly, mass effect, evidence of mass lesion, intracranial hemorrhage or evidence of cortically based acute infarction. Gray-white matter differentiation is within normal limits throughout the brain. Cerebral volume is within normal  limits for age. Vascular: Mild Calcified atherosclerosis at the skull base. No suspicious intracranial vascular hyperdensity. Skull: Negative. Sinuses/Orbits: Paranasal Visualized paranasal sinuses and mastoids are well pneumatized. Other: No acute orbit or scalp soft tissue findings. IMPRESSION: Normal for age non contrast CT appearance of the brain. Electronically Signed   By: Genevie Ann M.D.   On: 12/20/2018 12:07    Procedures Procedures (including critical care time)  Medications Ordered in ED Medications  diltiazem (CARDIZEM) injection 20 mg (20 mg Intravenous Given 12/20/18 1021)  furosemide (LASIX) injection 40 mg (40 mg Intravenous Given 12/20/18 1402)     Initial Impression / Assessment and Plan / ED Course  I have reviewed the triage vital signs and the nursing notes.  Pertinent labs & imaging results that were available during my care of the patient were reviewed by me and considered in my medical decision making (see chart for details).        Patient appears to have A. fib with RVR which has improved with diltiazem bolus.  He is feeling fine at this time.  He is noted to be a little short of breath and does have a degree of heart failure though not bad.  His episode this morning is unclear.  Possible seizure versus TIA.  Neurology has been consulted and recommends MRI/MRA as well as EEG and of overnight observation.  Hospitalist will admit.  Low level troponin is probably from CHF rather than ACS.  Final Clinical Impressions(s) / ED Diagnoses   Final diagnoses:  TIA (transient ischemic attack)  Atrial fibrillation with RVR (HCC)  Acute congestive heart failure, unspecified heart failure type [facility])    ED Discharge Orders    None       Sherwood Gambler, MD 12/20/18 [phone]

## 2018-12-20 NOTE — ED Notes (Signed)
Patient transported to MRI 

## 2018-12-20 NOTE — ED Notes (Signed)
Cardiology at bedside.

## 2018-12-20 NOTE — Consult Note (Signed)
    Pt seen & evaluated in ER (was there with IM Team). New Dx Afib with ? 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   Recommend - Change from Brilinta 90 mg BID to Plavix -- dose 300 mg Plavix in AM & 75 mg daily after  Check limited 2 D Echo -- need to reassess effusion & EF  Continue Eliquis --> would like to have ~4 weeks of coverage & consider DCCV (with current ? TIA Sx, not good timing for TEE-DCCV).  No ASA  HCTZ was d/c'd from Med list =-> now on Lasix 20 mg daily with additional 20 mg PRN based on Sliding Scale (for egt gain >3 lb in 1 day).  Increase Beta Blocker dose - try 75 mg Toprol & use Diltiazem gtt if HR remains > 100 (can convert to PO once stable).  Continue Colchcine. .  Recommendations d/w TRH MD. Full Note to follow   Glenetta Hew, M.D., M.S. Interventional Cardiologist   Pager # [phone] Phone # [phone] [address] [place], [place] [address]

## 2018-12-20 NOTE — Telephone Encounter (Signed)
I reviewed office note from 12/20/2018.  The patient has gone into AF (EKG included in office records) and had some CHF.  Lasix and Eliquis added, changing Brilinta to Plavix sounds appropriate. I will make sure he has a follow up.   Kerin Ransom PA-C 12/20/2018 2:45 PM

## 2018-12-20 NOTE — Progress Notes (Signed)
Pt set up on CPAP HS per request. Pt unable to tolerated CPAP of 10cmH2O and was titrated to a tolerable pressure of 6.5cmH2O.

## 2018-12-20 NOTE — Consult Note (Addendum)
Neurology Consultation  Reason for Consult: Possible seizure Referring Physician: Dr. Regenia Skeeter  CC: Possible seizure  History is obtained from: Patient  HPI: Devon Mathews. is a 68 y.o. male with history of shortness of breath, restless leg syndrome, obstructive sleep apnea, obesity, non-ST elevated myocardial infarct, lower extremity edema, essential hypertension, atrial fibrillation which is newly diagnosed is on Eliquis.  Patient states that lately he has been having some shortness of breath to which she went to see his cardiologist.  He did see the PCP who diagnosed him with congestive heart failure and increased his Lasix.  Today he was sitting with his neighbor when suddenly he felt as though he was very short of breath, at that time he states that his right arm head a tremulous activity and he continued to perseverate on his friend's name for about 10 seconds.  At that point he was able to state his friend's name and that he thought he should go to the hospital.  At this time he is at baseline.  He has never had a seizure per patient.  He was able to understand his friend and move all extremities during this episode.  ED course: CT head, labs  ROS: A 14 point ROS was performed and is negative except as noted in the HPI.   Past Medical History:  Diagnosis Date  . Atrial fibrillation ([place])   . CAD S/P percutaneous coronary angioplasty 09/13/2018    99% p-mLAD (BTW d1&d2) -> SYNERGY DES 3.5 X 12 (3.75 mm). 20% LM.  Cx, small RI & co-dom RCA normal.  EF 65%.   . Dizziness 2012   normal findings..  . Essential hypertension 07/28/2016  . Lower extremity edema 07/28/2016  . NSTEMI (non-ST elevated myocardial infarction) ([place]) 09/13/2018   The patient presented 09/13/2018 with a non-ST elevation MI, troponin peak was 0.15  . Obesity   . Obesity (BMI 30-39.9) 07/28/2016  . OSA (obstructive sleep apnea) 07/28/2016  . Restless leg syndrome    mild  . Shortness of breath 07/28/2016      Family History  Problem Relation Age of Onset  . Heart failure Mother   . Other Father        bypass surgery  . Heart disease Father   . Heart failure Father   . COPD Father   . Heart attack Sister   . Diabetes Sister   . Heart failure Sister   . Diabetes Brother   . Other Maternal Grandfather        smoking related disease  . Heart attack Paternal Grandmother    Social History:   reports that he has never smoked. He has never used smokeless tobacco. He reports that he does not drink alcohol or use drugs.  Medications No current facility-administered medications for this encounter.   Current Outpatient Medications:  .  Acetaminophen (TYLENOL PO), Take 2 tablets by mouth as needed (help releive pressure in his chest)., Disp: , Rfl:  .  amitriptyline (ELAVIL) 25 MG tablet, Take 25 mg by mouth at bedtime as needed for sleep., Disp: , Rfl:  .  atorvastatin (LIPITOR) 80 MG tablet, Take 1 tablet (80 mg total) by mouth daily at 6 PM., Disp: 30 tablet, Rfl: 11 .  colchicine 0.6 MG tablet, Take 1 tablet (0.6 mg total) by mouth 2 (two) times daily., Disp: 60 tablet, Rfl: 3 .  ELIQUIS 5 MG TABS tablet, Take 5 mg by mouth 2 (two) times daily., Disp: , Rfl:  .  furosemide (LASIX) 20 MG tablet, Take 20-40 mg by mouth See admin instructions. Take 40 mg Bid 12/20/18 and 12/21/18. Starting 12/22/18 daily, Disp: , Rfl:  .  methocarbamol (ROBAXIN) 500 MG tablet, Take 500 mg by mouth 3 (three) times daily as needed for muscle spasms., Disp: , Rfl:  .  metoprolol succinate (TOPROL-XL) 50 MG 24 hr tablet, Take 1 tablet (50 mg total) by mouth daily. Take with or immediately following a meal., Disp: 30 tablet, Rfl: 11 .  nitroGLYCERIN (NITROSTAT) 0.4 MG SL tablet, Place 1 tablet (0.4 mg total) under the tongue every 5 (five) minutes x 3 doses as needed for chest pain., Disp: 25 tablet, Rfl: 11 .  pantoprazole (PROTONIX) 40 MG tablet, TAKE ONE TABLET BY MOUTH ONE TIME DAILY. must make follow up  appointment for further refills (Patient taking differently: Take 40 mg by mouth at bedtime. ), Disp: 15 tablet, Rfl: 0 .  potassium chloride (K-DUR,KLOR-CON) 10 MEQ tablet, Take 10 mEq by mouth daily., Disp: , Rfl:  .  ticagrelor (BRILINTA) 90 MG TABS tablet, Take 1 tablet (90 mg total) by mouth 2 (two) times daily., Disp: 60 tablet, Rfl: 0 .  hydrochlorothiazide (MICROZIDE) 12.5 MG capsule, Take 1 capsule (12.5 mg total) by mouth daily. (Patient not taking: Reported on 12/20/2018), Disp: 30 capsule, Rfl: 11   Exam: Current vital signs: BP 134/90 (BP Location: Right Arm)   Pulse 100   Temp 98.2 F (36.8 C) (Oral)   Resp 19   SpO2 98%  Vital signs in last 24 hours: Temp:  [98.2 F (36.8 C)] 98.2 F (36.8 C) (03/04 1011) Pulse Rate:  [86-122] 100 (03/04 1400) Resp:  [13-27] 19 (03/04 1400) BP: (134)/(90) 134/90 (03/04 1011) SpO2:  [94 %-98 %] 98 % (03/04 1400)  Physical Exam  Constitutional: Appears well-developed and well-nourished.  Psych: Affect appropriate to situation Eyes: No scleral injection HENT: No OP obstrucion Head: Normocephalic.  Cardiovascular: Normal rate and regular rhythm.  Respiratory: Effort normal, non-labored breathing GI: Soft.  distension. There is no tenderness.  Skin: WDI with bruising on the inferior aspect of his abdomen  Neuro: Mental Status: Patient is alert, oriented, able to follow commands. Cranial Nerves: II: Visual Fields are full. III,IV, VI: EOMI without ptosis or diploplia.  Pupils are equal, round, and reactive to light.   V: Facial sensation is symmetric to temperature VII: Facial movement is symmetric.  VIII: hearing is intact to voice X: Uvula elevates symmetrically XI: Shoulder shrug is symmetric. XII: tongue is midline without atrophy or fasciculations.  Motor: Tone is normal. Bulk is normal. 5/5 strength was present in bilateral upper extremities, left leg however right leg has 4/5 strength when extended and trying to resist  downward pressure. Sensory: Sensation is symmetric to light touch and temperature in the arms and legs. Deep Tendon Reflexes: 0 at the ankles, 1+ bilateral knees, 2+ bilateral upper extremities Plantars: Toes are downgoing bilaterally.  Cerebellar: FNF and HKS are intact bilaterally  Labs I have reviewed labs in epic and the results pertinent to this consultation are:   CBC    Component Value Date/Time   WBC 5.7 12/20/2018 1000   RBC 3.75 (L) 12/20/2018 1000   HGB 13.0 12/20/2018 1000   HGB 15.0 12/01/2018 1554   HCT 38.4 (L) 12/20/2018 1000   HCT 43.6 12/01/2018 1554   PLT 265 12/20/2018 1000   PLT 214 12/01/2018 1554   MCV 102.4 (H) 12/20/2018 1000   MCV 101 (H) 12/01/2018 1554  MCH 34.7 (H) 12/20/2018 1000   MCHC 33.9 12/20/2018 1000   RDW 13.7 12/20/2018 1000   RDW 13.3 12/01/2018 1554   LYMPHSABS 1.4 12/20/2018 1000   MONOABS 0.6 12/20/2018 1000   EOSABS 0.2 12/20/2018 1000   BASOSABS 0.0 12/20/2018 1000    CMP     Component Value Date/Time   NA 137 12/20/2018 1000   NA 138 12/01/2018 1554   K 3.7 12/20/2018 1000   CL 104 12/20/2018 1000   CO2 22 12/20/2018 1000   GLUCOSE 115 (H) 12/20/2018 1000   BUN 13 12/20/2018 1000   BUN 14 12/01/2018 1554   CREATININE 1.08 12/20/2018 1000   CREATININE 1.03 06/27/2013 0840   CALCIUM 8.9 12/20/2018 1000   PROT 6.6 12/20/2018 1000   ALBUMIN 3.2 (L) 12/20/2018 1000   AST 29 12/20/2018 1000   ALT 33 12/20/2018 1000   ALKPHOS 85 12/20/2018 1000   BILITOT 1.1 12/20/2018 1000   GFRNONAA >60 12/20/2018 1000   GFRAA >60 12/20/2018 1000    Lipid Panel     Component Value Date/Time   CHOL 132 09/14/2018 0230   TRIG 118 09/14/2018 0230   HDL 39 (L) 09/14/2018 0230   CHOLHDL 3.4 09/14/2018 0230   VLDL 24 09/14/2018 0230   LDLCALC 69 09/14/2018 0230     Imaging I have reviewed the images obtained:  CT-scan of the brain-normal brain for age no acute abnormalities  MRI examination of the brain-pending  EEG  pending    Etta Quill PA-C [facility] [phone]  M-F  (9:00 am- 5:00 PM)  12/20/2018, 2:16 PM   I have seen the patient and reviewed the above note.  He had a transient episode of right arm tremulousness and confusion.  He states that he tried to take off his CPAP mask even though he was not wearing one, and used both arms to do this.  His demonstration of the movements that he was experiencing are more consistent with tremulousness than seizure.  He does not have any clear focal findings now other than asymmetric eyelid closure, which he states has been present since eyelid surgery.  Assessment:  68 year old male with transient episode of tremulousness and confusion.  His description makes me think that seizure is relatively unlikely.  Given no other episodes, as well as negative EEG, I would not start antiepileptics.  I suspect that he may have had some clot in the process of forming which was freed by the new anticoagulants causing a transient ischemic attack.  Treatment would be anticoagulation.   Recommendations: 1) complete TIA work-up with lipids, A1c, limited echo, Dopplers 2) continue anticoagulation with Eliquis 3) stroke team to follow  Roland Rack, MD [facility] [phone]  If 7pm- 7am, please page neurology on call as listed in [place].

## 2018-12-20 NOTE — ED Triage Notes (Signed)
Pt arrives to ED from home with complaints of with one minute of AMS today that his neighbor observed. Pts neighbor said that the pt's speech was "confusing".  Pt denies any weakness, CP, or SOB. Pt is in A-fib on arrival.

## 2018-12-21 ENCOUNTER — Inpatient Hospital Stay (HOSPITAL_COMMUNITY): Payer: BLUE CROSS/BLUE SHIELD

## 2018-12-21 ENCOUNTER — Ambulatory Visit: Payer: BLUE CROSS/BLUE SHIELD | Admitting: Cardiology

## 2018-12-21 DIAGNOSIS — I313 Pericardial effusion (noninflammatory): Secondary | ICD-10-CM

## 2018-12-21 DIAGNOSIS — I351 Nonrheumatic aortic (valve) insufficiency: Secondary | ICD-10-CM

## 2018-12-21 DIAGNOSIS — I509 Heart failure, unspecified: Secondary | ICD-10-CM

## 2018-12-21 DIAGNOSIS — G459 Transient cerebral ischemic attack, unspecified: Secondary | ICD-10-CM

## 2018-12-21 LAB — LIPID PANEL
Cholesterol: 79 mg/dL (ref 0–200)
HDL: 26 mg/dL — ABNORMAL LOW (ref >40)
LDL Cholesterol: 42 mg/dL (ref 0–99)
Total CHOL/HDL Ratio: 3 RATIO
Triglycerides: 54 mg/dL (ref <150)
VLDL: 11 mg/dL (ref 0–40)

## 2018-12-21 LAB — ECHOCARDIOGRAM LIMITED

## 2018-12-21 LAB — HEMOGLOBIN A1C
Hgb A1c MFr Bld: 5.5 % (ref 4.8–5.6)
Mean Plasma Glucose: 111.15 mg/dL

## 2018-12-21 LAB — VITAMIN B12: Vitamin B-12: 309 pg/mL (ref 180–914)

## 2018-12-21 MED ORDER — PERFLUTREN LIPID MICROSPHERE
1.0000 mL | INTRAVENOUS | Status: DC | PRN
  Administered 2018-12-21: 2 mL via INTRAVENOUS
  Filled 2018-12-21: qty 10

## 2018-12-21 MED ORDER — METOPROLOL SUCCINATE ER 25 MG PO TB24: 25.0000 mg | ORAL_TABLET | Freq: Every day | ORAL | Status: DC

## 2018-12-21 NOTE — Progress Notes (Signed)
Patient is on CPAP at this time using his own nasal pillows.

## 2018-12-21 NOTE — Progress Notes (Signed)
VASCULAR LAB PRELIMINARY  PRELIMINARY  PRELIMINARY  PRELIMINARY  Carotid duplex completed.    Preliminary report:  See CV proc for results  Josphine Laffey, RVT 12/21/2018, 11:37 AM

## 2018-12-21 NOTE — Progress Notes (Signed)
Patient arrived to the floor from the ED Alert and oriented X 4 Denies pain Skin clean dry and intact Placed on tele All questions and concerns addressed. Bed in the lowest position with with alarm set. Call light in reach Wife at bedside.

## 2018-12-21 NOTE — Progress Notes (Addendum)
STROKE TEAM PROGRESS NOTE   INTERVAL HISTORY Patient in bed NAD. I have personally reviewed history of present illness for the patient. He had a transient episode of speech difficulties which lasted less than a minute.  Vitals:   12/20/18 2239 12/21/18 0015 12/21/18 0325 12/21/18 0730  BP: 117/68 119/66 128/77 109/84  Pulse: 90 93 (!) 108 87  Resp: 20 18 18 18   Temp: 98 F (36.7 C) 98.1 F (36.7 C) 98.3 F (36.8 C) 98.4 F (36.9 C)  TempSrc: Oral Oral Oral Oral  SpO2: 96% 96% 98% 95%    CBC:  Recent Labs  Lab 12/20/18 1000  WBC 5.7  NEUTROABS 3.5  HGB 13.0  HCT 38.4*  MCV 102.4*  PLT 175    Basic Metabolic Panel:  Recent Labs  Lab 12/20/18 1000  NA 137  K 3.7  CL 104  CO2 22  GLUCOSE 115*  BUN 13  CREATININE 1.08  CALCIUM 8.9   Lipid Panel:     Component Value Date/Time   CHOL 79 12/21/2018 0528   TRIG 54 12/21/2018 0528   HDL 26 (L) 12/21/2018 0528   CHOLHDL 3.0 12/21/2018 0528   VLDL 11 12/21/2018 0528   LDLCALC 42 12/21/2018 0528   HgbA1c:  Lab Results  Component Value Date   HGBA1C 5.5 12/21/2018   Urine Drug Screen:     Component Value Date/Time   LABOPIA NONE DETECTED 12/20/2018 1025   COCAINSCRNUR NONE DETECTED 12/20/2018 1025   LABBENZ NONE DETECTED 12/20/2018 1025   AMPHETMU NONE DETECTED 12/20/2018 1025   THCU NONE DETECTED 12/20/2018 1025   LABBARB NONE DETECTED 12/20/2018 1025    Alcohol Level No results found for: ETH  IMAGING Dg Chest 2 View  Result Date: 12/20/2018 CLINICAL DATA:  Cough, dyspnea and new onset of atrial fibrillation. EXAM: CHEST - 2 VIEW COMPARISON:  Chest x-ray dated 09/12/2018 FINDINGS: There are new small bilateral pleural effusions. There is slight prominence of the pulmonary vascularity on the lateral view. Heart size is within normal limits considering the AP technique. No acute bone abnormality. IMPRESSION: New small bilateral pleural effusions. Prominent pulmonary vascularity demonstrated on the lateral  view. Electronically Signed   By: Lorriane Shire M.D.   On: 12/20/2018 11:25   Ct Head Wo Contrast  Result Date: 12/20/2018 CLINICAL DATA:  68 year old male with altered mental status, in atrial fibrillation on arrival. EXAM: CT HEAD WITHOUT CONTRAST TECHNIQUE: Contiguous axial images were obtained from the base of the skull through the vertex without intravenous contrast. COMPARISON:  Orbit radiographs 11/07/2014. FINDINGS: Brain: No midline shift, ventriculomegaly, mass effect, evidence of mass lesion, intracranial hemorrhage or evidence of cortically based acute infarction. Gray-white matter differentiation is within normal limits throughout the brain. Cerebral volume is within normal limits for age. Vascular: Mild Calcified atherosclerosis at the skull base. No suspicious intracranial vascular hyperdensity. Skull: Negative. Sinuses/Orbits: Paranasal Visualized paranasal sinuses and mastoids are well pneumatized. Other: No acute orbit or scalp soft tissue findings. IMPRESSION: Normal for age non contrast CT appearance of the brain. Electronically Signed   By: Genevie Ann M.D.   On: 12/20/2018 12:07   Mr [name] Head Wo Contrast  Result Date: 12/20/2018 CLINICAL DATA:  68 y/o M; altered mental status and atrial fibrillation on arrival. EXAM: MRI HEAD WITHOUT CONTRAST MRA HEAD WITHOUT CONTRAST TECHNIQUE: Multiplanar, multiecho pulse sequences of the brain and surrounding structures were obtained without intravenous contrast. Angiographic images of the head were obtained using MRA technique without contrast. COMPARISON:  12/20/2018 CT head FINDINGS: MRI HEAD FINDINGS Brain: No acute infarction, hemorrhage, hydrocephalus, extra-axial collection or mass effect. 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 Few punctate nonspecific T2 FLAIR hyperintensities in subcortical and periventricular white matter are compatible with minimal chronic microvascular ischemic changes. 2-3 mm subependymal nodules (4  identified, series 7, image 18-19) of the right lateral ventricle. Vascular: Normal flow voids. Skull and upper cervical spine: Normal marrow signal. Sinuses/Orbits: Negative. Other: None. MRA HEAD FINDINGS Anterior circulation: No large vessel occlusion, aneurysm, or significant stenosis is identified. Posterior circulation: No large vessel occlusion, aneurysm, or significant stenosis is identified. Anatomic variation: None significant. IMPRESSION: 1. No acute intracranial abnormality. 2. Minimal chronic microvascular ischemic changes of white matter. 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 3. 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 4. Normal MRA of the head. Electronically Signed   By: Kristine Garbe M.D.   On: 12/20/2018 18:22   Mr Brain Wo Contrast  Result Date: 12/20/2018 CLINICAL DATA:  68 y/o M; altered mental status and atrial fibrillation on arrival. EXAM: MRI HEAD WITHOUT CONTRAST MRA HEAD WITHOUT CONTRAST TECHNIQUE: Multiplanar, multiecho pulse sequences of the brain and surrounding structures were obtained without intravenous contrast. Angiographic images of the head were obtained using MRA technique without contrast. COMPARISON:  12/20/2018 CT head FINDINGS: MRI HEAD FINDINGS Brain: No acute infarction, hemorrhage, hydrocephalus, extra-axial collection or mass effect. 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 Few punctate nonspecific T2 FLAIR hyperintensities in subcortical and periventricular white matter are compatible with minimal chronic microvascular ischemic changes. 2-3 mm subependymal nodules (4 identified, series 7, image 18-19) of the right lateral ventricle. Vascular: Normal flow voids. Skull and upper cervical spine: Normal marrow signal. Sinuses/Orbits: Negative. Other: None. MRA HEAD FINDINGS Anterior circulation: No large  vessel occlusion, aneurysm, or significant stenosis is identified. Posterior circulation: No large vessel occlusion, aneurysm, or significant stenosis is identified. Anatomic variation: None significant. IMPRESSION: 1. No acute intracranial abnormality. 2. Minimal chronic microvascular ischemic changes of white matter. 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 3. 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 4. Normal MRA of the head. Electronically Signed   By: Kristine Garbe M.D.   On: 12/20/2018 18:22    PHYSICAL EXAM  Obese middle-aged Caucasian male not in distress. . Afebrile. Head is nontraumatic. Neck is supple without bruit.    Cardiac exam no murmur or gallop. Lungs are clear to auscultation. Distal pulses are well felt. Neurological Exam ;  Awake  Alert oriented x 3. Normal speech and language.eye movements full without nystagmus.fundi were not visualized. Vision acuity and fields appear normal. Hearing is normal. Palatal movements are normal. Face symmetric. Tongue midline. Normal strength, tone, reflexes and coordination. Normal sensation. Gait deferred.   ASSESSMENT/PLAN Mr. Devon Mathews. is a 68 y.o. male with history of  SOB, RLS, OSA, HTN, Afib ( on eliquis) presenting with possible seizure. His right arm had tremulous activity for about 10 seconds on 12/20/2018. MRI- negative for stroke. MRA: normal EEG: normal  Transient episode of speech difficulty questionable TIA:   secondary to a. 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  MRA   Normal   Carotid Doppler  pending  2D Echo  pending  LDL 42  HgbA1c 5.5  Eliquis for VTE prophylaxis Diet Order            Diet Heart Room service appropriate? Yes; Fluid consistency: Thin  Diet effective now                Eliquis (apixaban) daily prior to admission, now on Eliquis (apixaban) daily.   Therapy recommendations:  Pending   Disposition:  pending  Hypertension  Stable . Permissive hypertension (OK if < 220/120) but gradually normalize in 5-7 days . Long-term BP goal normotensive  Hyperlipidemia  Home meds:  atorvastatin 80 mg , resumed in hospital  LDL 42, goal < 70  Continue statin at discharge  Diabetes type II  HgbA1c 5.5, goal < 7.0  Controlled  Other Stroke Risk Factors  Advanced age  Coronary artery disease  Obstructive sleep apnea, CPAP at night  Atrial fibrillation    Other Active Problems  hemorrhagic pericarditis  Hospital day # [place], MSN, NP-C [facility] [phone] I have personally obtained history,examined this patient, reviewed notes, independently viewed imaging studies, participated in medical decision making and plan of care.ROS completed by me personally and pertinent positives fully documented  I have made any additions or clarifications directly to the above note. Agree with note above. He presented with transient episode of speech difficulties with verbal perseveration and difficulty expressing himself but lasting only 10-15 seconds. He denied any loss of consciousness, headache or any other focal symptoms. Is unclear whether this represents a TIA not but brain imaging and neurovascular workup is unremarkable. Continue eliquis for stroke prevention given history of atrial fibrillation and use CPAP every night for his sleep apnea. Continue ongoing stroke workup. Discussed with patient and Dr.Hongalgi Greater than 50% time during this 35 minute visit was spent on counseling and coordination of care about his TIA like episode and answering questions Antony Contras, MD Medical Director [place] Pager: [phone] 12/21/2018 2:45 PM   To contact Stroke Continuity provider, please refer to  [email or web address] After hours, contact General Neurology

## 2018-12-21 NOTE — Progress Notes (Signed)
PT Cancellation Note  Patient Details Name: Devon Mathews. MRN: [id number] DOB: [birth year]   Cancelled Treatment:    Reason Eval/Treat Not Completed: Patient at procedure or test/unavailable Transport staff in room taking pt down for procedure. Will follow up as pt available and as schedule allows.   Leighton Ruff, PT, DPT  Acute Rehabilitation Services  Pager: [phone] Office: [phone]    Devon Mathews 12/21/2018, 10:55 AM

## 2018-12-21 NOTE — Telephone Encounter (Signed)
Patient currently admitted

## 2018-12-21 NOTE — Progress Notes (Signed)
  Echocardiogram 2D Echocardiogram has been performed.  Garima Chronis L Androw 12/21/2018, 11:58 AM

## 2018-12-21 NOTE — Progress Notes (Signed)
PROGRESS NOTE   Devon Mathews.  WGN:[id number]    DOB: Mar 06, 1951    DOA: 12/20/2018  PCP: Haywood Pao, MD   I have briefly reviewed patients previous medical records in [facility].  Brief Narrative:  68 year old married male with PMH of CAD status post DES to LAD 08/2018, HTN, HLD, obesity, OSA on CPAP, RLS, recent hospitalization 12/05/2018-12/08/2018 when he presented for cath due to an abnormal stress test after seen in clinic for chest pain, complicated hospital course including PCI complicated by diagonal microperforation, hemorrhagic pericarditis/pericardial effusion, early tamponade, borderline cardiogenic shock requiring pressors, Acute kidney injury, seen by outpatient cardiology on 2/28 but on weekend PTA developed dyspnea, orthopnea, unable to get an appointment with his cardiologist and hence seen by PCP where reportedly diagnosed with new onset A. fib and CHF, HCTZ changed to Williams discontinued, Brilinta changed to Plavix and Eliquis started with improvement in symptoms, now presented to ED with transient confusion, speech difficulty and shaking of right hand.  Admitted for possible TIA, new onset A. fib with RVR and hemorrhagic pericarditis.  Cardiology and neurology consulted.  On 3/5, cardiology advised that patient has large pericardial effusion with early tamponade features, recommended transferring him to cardiac progressive care unit for close monitoring and have consulted CVTS for possible pericardial window.  Eliquis discontinued, last dose was 3/5 at 8:55 AM.   Assessment & Plan:   Principal Problem:   TIA (transient ischemic attack) Active Problems:   CAD S/P percutaneous coronary angioplasty   Essential hypertension   Lower extremity edema   Shortness of breath   OSA (obstructive sleep apnea)   History of non-ST elevation myocardial infarction (NSTEMI)   Hyperlipidemia LDL goal <70   Atrial fibrillation with RVR (HCC)   Pericardial effusion without  cardiac tamponade   1. Transient episode of speech difficulties with verbal perseveration: Neurology consulted and assisted with evaluation and management.  Initially admitted as a possible TIA.  Stroke MD follow-up appreciated who opines that it is unclear whether this represents a TIA, brain imaging and neurovascular work-up is unremarkable.  Patient was transitioned to Plavix and Eliquis on admission.  However as per cardiology follow-up, pericardial effusion has enlarged, needs intervention, hence Eliquis discontinued.  CT head: Normal.  MRI brain: No acute intracranial abnormality.  MRA head: Normal.TTE: LVEF > 65%.  Moderate circumferential pericardial effusion.  Carotid Dopplers: Near normal.  LDL 42.  A1c 5.5. 2. Pericardial effusion: TTE shows moderate circumferential pericardial effusion and when compared to 2/20, this pericardial effusion has increased in size and concern for early tamponade.  Cardiology input appreciated.  I discussed with Dr. [name] who recommends stopping Lasix, reducing Toprol-XL from 75 to 25 mg daily to avoid hypotension, transfer to cardiac progressive care unit.  As per note, patient had difficulty with attempted pericardiocentesis at the time of initial guidewire perforation, moreover patient on Plavix and Eliquis, thereby feel pericardial window is likely a safer option.  Cardiology have consulted CVTS for possible pericardial window.  He may require amiodarone for rate control.  Continue colchicine. 3. New onset A. fib with RVR: 4. Essential hypertension: Controlled. 5. Hyperlipidemia: LDL 42, goal <7.  Continue atorvastatin. 6. OSA on CPAP: Unable to tolerate hospital CPAP last night and used nasal cannula oxygen.  May consider bringing home CPAP. 7. Obesity/BMI 39.28  DVT prophylaxis: SCDs.  Eliquis discontinued-Last dose was on 3/5 at 8:55 AM. Code Status: Full Family Communication: None at bedside Disposition: Transfer to cardiac progressive unit  on  12/21/2018.   Consultants:  Cardiology Neurology CVTS-pending  Procedures:  None  Antimicrobials:  None   Subjective: No recurrence of speech difficulties, confusion or involuntary limb movements.  Denies dyspnea, chest pain, palpitations, dizziness or lightheadedness.  States that this is the best he has felt in a long time.  ROS: As above, otherwise negative  Objective:  Vitals:   12/21/18 0325 12/21/18 0730 12/21/18 1213 12/21/18 1441  BP: 128/77 109/84 106/61 115/80  Pulse: (!) 108 87 (!) 108 (!) 110  Resp: 18 18 18    Temp: 98.3 F (36.8 C) 98.4 F (36.9 C) 98.3 F (36.8 C)   TempSrc: Oral Oral Oral   SpO2: 98% 95% 97%     Examination:  General exam: Pleasant middle-age male, moderately built and obese sitting up comfortably in bed without distress. Respiratory system: Clear to auscultation. Respiratory effort normal. Cardiovascular system: S1 & S2 heard, irregularly irregular and mildly tachycardic. No JVD, murmurs, rubs, gallops or clicks.  Trace bilateral ankle edema.  Telemetry personally reviewed: A. fib with RVR with ventricular rate ranging in the 100s-120s. Gastrointestinal system: Abdomen is nondistended, soft and nontender. No organomegaly or masses felt. Normal bowel sounds heard. Central nervous system: Alert and oriented. No focal neurological deficits. Extremities: Symmetric 5 x 5 power. Skin: No rashes, lesions or ulcers Psychiatry: Judgement and insight appear normal. Mood & affect appropriate.     Data Reviewed: I have personally reviewed following labs and imaging studies  CBC: Recent Labs  Lab 12/20/18 1000  WBC 5.7  NEUTROABS 3.5  HGB 13.0  HCT 38.4*  MCV 102.4*  PLT 097   Basic Metabolic Panel: Recent Labs  Lab 12/20/18 1000  NA 137  K 3.7  CL 104  CO2 22  GLUCOSE 115*  BUN 13  CREATININE 1.08  CALCIUM 8.9   Liver Function Tests: Recent Labs  Lab 12/20/18 1000  AST 29  ALT 33  ALKPHOS 85  BILITOT 1.1  PROT 6.6   ALBUMIN 3.2*   Coagulation Profile: No results for input(s): INR, PROTIME in the last 168 hours. Cardiac Enzymes: Recent Labs  Lab 12/20/18 1000  TROPONINI 0.03*   HbA1C: Recent Labs    12/21/18 0528  HGBA1C 5.5   CBG: Recent Labs  Lab 12/20/18 1524  GLUCAP 111*    No results found for this or any previous visit (from the past 240 hour(s)).       Radiology Studies: Dg Chest 2 View  Result Date: 12/20/2018 CLINICAL DATA:  Cough, dyspnea and new onset of atrial fibrillation. EXAM: CHEST - 2 VIEW COMPARISON:  Chest x-ray dated 09/12/2018 FINDINGS: There are new small bilateral pleural effusions. There is slight prominence of the pulmonary vascularity on the lateral view. Heart size is within normal limits considering the AP technique. No acute bone abnormality. IMPRESSION: New small bilateral pleural effusions. Prominent pulmonary vascularity demonstrated on the lateral view. Electronically Signed   By: Lorriane Shire M.D.   On: 12/20/2018 11:25   Ct Head Wo Contrast  Result Date: 12/20/2018 CLINICAL DATA:  68 year old male with altered mental status, in atrial fibrillation on arrival. EXAM: CT HEAD WITHOUT CONTRAST TECHNIQUE: Contiguous axial images were obtained from the base of the skull through the vertex without intravenous contrast. COMPARISON:  Orbit radiographs 11/07/2014. FINDINGS: Brain: No midline shift, ventriculomegaly, mass effect, evidence of mass lesion, intracranial hemorrhage or evidence of cortically based acute infarction. Gray-white matter differentiation is within normal limits throughout the brain. Cerebral volume is within normal  limits for age. Vascular: Mild Calcified atherosclerosis at the skull base. No suspicious intracranial vascular hyperdensity. Skull: Negative. Sinuses/Orbits: Paranasal Visualized paranasal sinuses and mastoids are well pneumatized. Other: No acute orbit or scalp soft tissue findings. IMPRESSION: Normal for age non contrast CT  appearance of the brain. Electronically Signed   By: Genevie Ann M.D.   On: 12/20/2018 12:07   Mr [name] Head Wo Contrast  Result Date: 12/20/2018 CLINICAL DATA:  68 y/o M; altered mental status and atrial fibrillation on arrival. EXAM: MRI HEAD WITHOUT CONTRAST MRA HEAD WITHOUT CONTRAST TECHNIQUE: Multiplanar, multiecho pulse sequences of the brain and surrounding structures were obtained without intravenous contrast. Angiographic images of the head were obtained using MRA technique without contrast. COMPARISON:  12/20/2018 CT head FINDINGS: MRI HEAD FINDINGS Brain: No acute infarction, hemorrhage, hydrocephalus, extra-axial collection or mass effect. 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 Few punctate nonspecific T2 FLAIR hyperintensities in subcortical and periventricular white matter are compatible with minimal chronic microvascular ischemic changes. 2-3 mm subependymal nodules (4 identified, series 7, image 18-19) of the right lateral ventricle. Vascular: Normal flow voids. Skull and upper cervical spine: Normal marrow signal. Sinuses/Orbits: Negative. Other: None. MRA HEAD FINDINGS Anterior circulation: No large vessel occlusion, aneurysm, or significant stenosis is identified. Posterior circulation: No large vessel occlusion, aneurysm, or significant stenosis is identified. Anatomic variation: None significant. IMPRESSION: 1. No acute intracranial abnormality. 2. Minimal chronic microvascular ischemic changes of white matter. 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 3. 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 4. Normal MRA of the head. Electronically Signed   By: Kristine Garbe M.D.   On: 12/20/2018 18:22   Mr Brain Wo Contrast  Result Date: 12/20/2018 CLINICAL DATA:  68 y/o M; altered mental status and atrial fibrillation on arrival. EXAM: MRI  HEAD WITHOUT CONTRAST MRA HEAD WITHOUT CONTRAST TECHNIQUE: Multiplanar, multiecho pulse sequences of the brain and surrounding structures were obtained without intravenous contrast. Angiographic images of the head were obtained using MRA technique without contrast. COMPARISON:  12/20/2018 CT head FINDINGS: MRI HEAD FINDINGS Brain: No acute infarction, hemorrhage, hydrocephalus, extra-axial collection or mass effect. 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 Few punctate nonspecific T2 FLAIR hyperintensities in subcortical and periventricular white matter are compatible with minimal chronic microvascular ischemic changes. 2-3 mm subependymal nodules (4 identified, series 7, image 18-19) of the right lateral ventricle. Vascular: Normal flow voids. Skull and upper cervical spine: Normal marrow signal. Sinuses/Orbits: Negative. Other: None. MRA HEAD FINDINGS Anterior circulation: No large vessel occlusion, aneurysm, or significant stenosis is identified. Posterior circulation: No large vessel occlusion, aneurysm, or significant stenosis is identified. Anatomic variation: None significant. IMPRESSION: 1. No acute intracranial abnormality. 2. Minimal chronic microvascular ischemic changes of white matter. 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 3. 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 4. Normal MRA of the head. Electronically Signed   By: Kristine Garbe M.D.   On: 12/20/2018 18:22   Vas US Carotid (at [place] Only)  Result Date: 12/21/2018 Carotid Arterial Duplex Study Indications:       TIA and Altered mental status, shaking. Risk Factors:      Hypertension, coronary artery disease. Other Factors:     Atrial fibrillation, OSA. Limitations:       Body habitus, depth of vessels,  breathing interference Comparison Study:  Prior study from 12/15/10 is  available for comparison Performing Technologist: Sharion Dove RVS  Examination Guidelines: A complete evaluation includes B-mode imaging, spectral Doppler, color Doppler, and power Doppler as needed of all accessible portions of each vessel. Bilateral testing is considered an integral part of a complete examination. Limited examinations for reoccurring indications may be performed as noted.  Right Carotid Findings: +----------+--------+--------+--------+--------+--------+           PSV cm/sEDV cm/sStenosisDescribeComments +----------+--------+--------+--------+--------+--------+ CCA Prox  137     29                               +----------+--------+--------+--------+--------+--------+ CCA Distal83      22                               +----------+--------+--------+--------+--------+--------+ ICA Prox  70      13                               +----------+--------+--------+--------+--------+--------+ ICA Distal32      8                                +----------+--------+--------+--------+--------+--------+ ECA       160     20                               +----------+--------+--------+--------+--------+--------+ +----------+--------+-------+--------+-------------------+           PSV cm/sEDV cmsDescribeArm Pressure (mmHG) +----------+--------+-------+--------+-------------------+ [id number]                                         +----------+--------+-------+--------+-------------------+  Left Carotid Findings: +----------+--------+--------+--------+--------+--------+           PSV cm/sEDV cm/sStenosisDescribeComments +----------+--------+--------+--------+--------+--------+ CCA Prox  68      18                               +----------+--------+--------+--------+--------+--------+ CCA Distal46      8                                +----------+--------+--------+--------+--------+--------+ ICA Prox  39      13                                +----------+--------+--------+--------+--------+--------+ ICA Distal43      19                               +----------+--------+--------+--------+--------+--------+ +----------+--------+--------+--------+-------------------+ SubclavianPSV cm/sEDV cm/sDescribeArm Pressure (mmHG) +----------+--------+--------+--------+-------------------+           78                                          +----------+--------+--------+--------+-------------------+  Summary: Right Carotid: The extracranial vessels were near-normal with only minimal  wall                thickening or plaque. Left Carotid: The extracranial vessels were near-normal with only minimal wall               thickening or plaque. Vertebrals:  Bilateral vertebral arteries were not visualized. Subclavians: Normal flow hemodynamics were seen in bilateral subclavian              arteries. *See table(s) above for measurements and observations.     Preliminary         Scheduled Meds: . atorvastatin  80 mg Oral q1800  . [START ON 12/22/2018] clopidogrel  75 mg Oral Daily  . colchicine  0.6 mg Oral BID  . [START ON 12/22/2018] metoprolol succinate  25 mg Oral Q lunch  . pantoprazole  40 mg Oral QHS  . potassium chloride  10 mEq Oral Daily   Continuous Infusions:   LOS: 1 day     Vernell Leep, MD, FACP, [facility]. [facility]  To contact the attending provider between 7A-7P or the covering provider during after hours 7P-7A, please log into the web site [email or web address] and access using universal [place] password for that web site. If you do not have the password, please call the hospital operator.  12/21/2018, 3:10 PM

## 2018-12-21 NOTE — Consult Note (Signed)
[facility] Center[facility] [address]       [place] [address]             [phone]      Cardiothoracic Surgery Consultation  Reason for Consult: Pericardial effusion Referring Physician: Dr. Fonda Kinder. is an 68 y.o. male.  HPI:   The patient is a 68 year old morbidly obese gentleman with a history of coronary disease status post DES to the mid LAD in 08/2018 after presenting with a non-ST segment elevation MI.  He said that he felt much better but then had a couple episodes of brief left-sided chest pain.  He underwent a nuclear stress test on 11/30/2018 which showed a medium sized severe perfusion defect in inferior and inferolateral walls with stress that was partially reversible at rest as well as a large defect of severe severity in the anterior wall from the base to the apex that was partially reversible at rest.  This was felt to be consistent with ischemia and was a high risk study.  Patient subsequently underwent cardiac catheterization on 12/05/2018 and the culprit was felt to be a 90% ostial first diagonal that was jailed by the previous stent.  The patient underwent PCI which was complicated by a wire perforation in the distal diagonal with development of a pericardial effusion and pericardial tamponade with hemodynamic instability.  Patient was treated with balloon occlusion of the diagonal branch and administration of protamine.  The previously placed LAD stent was widely patent.  The scarring arteries were otherwise stable.  He had an echocardiogram after the procedure as well as later that evening which showed a pericardial effusion that was classified as small and patient no evidence of tamponade.  Follow-up echo the next morning again showed a small pericardial effusion without tamponade.  Patient was discharged home on aspirin and Brilinta for his recent LAD stent and colchicine for suspected pericarditis.  The patient said that he did well until this past Sunday  when he developed significant shortness of breath and could not sleep all night.  He was sitting up in a chair and still could not sleep.  On Monday morning he went to his primary physician because he could not get into the cardiology office and was noted to be in atrial fibrillation with a rapid ventricular response.  Chest x-ray reportedly showed congestive heart failure.  His aspirin was apparently stopped and he was started on Eliquis.Marland Kitchen  He was treated with Lasix 20 mg but did not have an improvement in his breathing.  He called to the cardiology office and his Lasix was increased to 40 mg with some improvement.  Then yesterday morning he said that he did not feel right and had his neighbor come over to his house.  He began having some tremors in his right hand and some stuttering speech and the neighbor felt like he may be having a seizure or a stroke.  The symptoms lasted less than a few minutes.  The neighbor called 911 and the patient was brought to the emergency room where he felt much better.  He has been hemodynamically stable since presentation.  He has remained in atrial fibrillation with a controlled rate.  A follow-up echocardiogram today showed an increase in the size of his pericardial effusion to moderate and it was circumferential.  There were early signs of possible tamponade on echo with inversion of the right ventricular wall and inversion of the right atrial wall.  Past Medical History:  Diagnosis Date  . Atrial fibrillation ([place])   . CAD S/P percutaneous coronary angioplasty 09/13/2018    99% p-mLAD (BTW d1&d2) -> SYNERGY DES 3.5 X 12 (3.75 mm). 20% LM.  Cx, small RI & co-dom RCA normal.  EF 65%.   . Dizziness 2012   normal findings..  . Essential hypertension 07/28/2016  . Lower extremity edema 07/28/2016  . NSTEMI (non-ST elevated myocardial infarction) ([facility]) 09/13/2018   The patient presented 09/13/2018 with a non-ST elevation MI, troponin peak was 0.15  . Obesity   . Obesity  (BMI 30-39.9) 07/28/2016  . OSA (obstructive sleep apnea) 07/28/2016  . Restless leg syndrome    mild  . Shortness of breath 07/28/2016    Past Surgical History:  Procedure Laterality Date  . CORONARY BALLOON ANGIOPLASTY N/A 12/05/2018   Procedure: CORONARY BALLOON ANGIOPLASTY;  Surgeon: [name], MD;  Location: [facility];  Service: Cardiovascular;  Laterality: N/A;  . CORONARY STENT INTERVENTION N/A 09/13/2018   Procedure: CORONARY STENT INTERVENTION;  Surgeon: Belva Crome, MD;  Location: [facility];  Service: Cardiovascular;; p-m LAD (btw D1&D2) - SYNERGY DES 3.5 x 12 (3.75 mm).  Marland Kitchen KNEE ARTHROSCOPY Right   . LEFT HEART CATH AND CORONARY ANGIOGRAPHY N/A 09/13/2018   Procedure: LEFT HEART CATH AND CORONARY ANGIOGRAPHY;  Surgeon: Belva Crome, MD;  Location: [facility];  Service: Cardiovascular;;  99% p-mLAD (BTW d1&d2) -> SYNERGY DES 3.5 X 12 (3.75 mm). 20% LM.  Cx, small RI & co-dom RCA normal.  EF 65%.   Marland Kitchen LEFT HEART CATH AND CORONARY ANGIOGRAPHY  2003   30-40% proximal segmental stenosis in the LAD  . LEFT HEART CATH AND CORONARY ANGIOGRAPHY  2006   EF >50% & no mitral regurgitation  . LEFT HEART CATH AND CORONARY ANGIOGRAPHY N/A 12/05/2018   Procedure: LEFT HEART CATH AND CORONARY ANGIOGRAPHY;  Surgeon: [name], MD;  Location: [facility];  Service: Cardiovascular;  Laterality: N/A;  . NM MYOVIEW LTD  07/2016   Normal.  EF 55-65% 63%).  No ischemia or infarction.  LOW RISK  . NM MYOVIEW LTD  11/2018   6: 34 min.  7.2 METS.  Noted chest pain and tightness.  Horizontal ST of T wave depression (2 mm) II, 3, V5 and V6 (HIGH RISK), large/severe defect basal-apical inferior, inferolateral wall.  Large-severe basal-apical anterior-anterolateral wall.  Medium/moderate defect in basal and mid inferoseptal wall.  Consistent with large prior infarct with peri-infarct ischemia.  HIGH RISK  . PERICARDIOCENTESIS N/A 12/05/2018   Procedure:  PERICARDIOCENTESIS;  Surgeon: [name], MD;  Location: [facility];  Service: Cardiovascular;  Laterality: N/A;  . TRANSTHORACIC ECHOCARDIOGRAM  07/2016   EF 60-65% with mild LVH.  Mild AI.  Trivial MR and TR.    Family History  Problem Relation Age of Onset  . Heart failure Mother   . Other Father        bypass surgery  . Heart disease Father   . Heart failure Father   . COPD Father   . Heart attack Sister   . Diabetes Sister   . Heart failure Sister   . Diabetes Brother   . Other Maternal Grandfather        smoking related disease  . Heart attack Paternal Grandmother     Social History:  reports that he has never smoked. He has never used smokeless tobacco. He reports that he  does not drink alcohol or use drugs.  Allergies:  Allergies  Allergen Reactions  . Penicillins Hives and Nausea And Vomiting    Did it involve swelling of the face/tongue/throat, SOB, or low BP? No Did it involve sudden or severe rash/hives, skin peeling, or any reaction on the inside of your mouth or nose? No Did you need to seek medical attention at a hospital or doctor's office? No When did it last happen?30 + years If all above answers are "NO", may proceed with cephalosporin use.     Medications:  I have reviewed the patient's current medications. Prior to Admission:  Medications Prior to Admission  Medication Sig Dispense Refill Last Dose  . Acetaminophen (TYLENOL PO) Take 2 tablets by mouth as needed (help releive pressure in his chest).   12/16/2018 at prn  . amitriptyline (ELAVIL) 25 MG tablet Take 25 mg by mouth at bedtime as needed for sleep.   Past Week at prn  . atorvastatin (LIPITOR) 80 MG tablet Take 1 tablet (80 mg total) by mouth daily at 6 PM. 30 tablet 11 12/19/2018 at Unknown time  . colchicine 0.6 MG tablet Take 1 tablet (0.6 mg total) by mouth 2 (two) times daily. 60 tablet 3 12/20/2018 at Unknown time  . ELIQUIS 5 MG TABS tablet Take 5 mg by mouth 2 (two)  times daily.   12/20/2018 at 0730  . furosemide (LASIX) 20 MG tablet Take 20-40 mg by mouth See admin instructions. Take 40 mg Bid 12/20/18 and 12/21/18. Starting 12/22/18 daily   12/20/2018 at Unknown time  . methocarbamol (ROBAXIN) 500 MG tablet Take 500 mg by mouth 3 (three) times daily as needed for muscle spasms.   Past Week at prn  . metoprolol succinate (TOPROL-XL) 50 MG 24 hr tablet Take 1 tablet (50 mg total) by mouth daily. Take with or immediately following a meal. 30 tablet 11 12/20/2018 at 0730  . nitroGLYCERIN (NITROSTAT) 0.4 MG SL tablet Place 1 tablet (0.4 mg total) under the tongue every 5 (five) minutes x 3 doses as needed for chest pain. 25 tablet 11 unknown at prn  . pantoprazole (PROTONIX) 40 MG tablet TAKE ONE TABLET BY MOUTH ONE TIME DAILY. must make follow up appointment for further refills (Patient taking differently: Take 40 mg by mouth at bedtime. ) 15 tablet 0 12/19/2018 at Unknown time  . potassium chloride (K-DUR,KLOR-CON) 10 MEQ tablet Take 10 mEq by mouth daily.   12/20/2018 at Unknown time  . ticagrelor (BRILINTA) 90 MG TABS tablet Take 1 tablet (90 mg total) by mouth 2 (two) times daily. 60 tablet 0 12/20/2018 at 0730  . hydrochlorothiazide (MICROZIDE) 12.5 MG capsule Take 1 capsule (12.5 mg total) by mouth daily. (Patient not taking: Reported on 12/20/2018) 30 capsule 11 Not Taking at Unknown time   Scheduled: . atorvastatin  80 mg Oral q1800  . [START ON 12/22/2018] clopidogrel  75 mg Oral Daily  . colchicine  0.6 mg Oral BID  . [START ON 12/22/2018] metoprolol succinate  25 mg Oral Q lunch  . pantoprazole  40 mg Oral QHS  . potassium chloride  10 mEq Oral Daily   Continuous:  [id number], amitriptyline, methocarbamol, nitroGLYCERIN, senna-docusate  Results for orders placed or performed during the hospital encounter of 12/20/18 (from the past 48 hour(s))  Troponin I - Once     Status: Abnormal   Collection Time: 12/20/18 10:00 AM  Result Value Ref Range   Troponin I  0.03 (HH) <0.03 ng/mL  Comment: CRITICAL RESULT CALLED TO, READ BACK BY AND VERIFIED WITH: Shelly Rubenstein, RN AT 1119 ON 12/19/2018 BY SAINVILUS S CORRECT CALL DATE  12/20/2018 Performed at [facility], [place] [address], [place], [place] [address] CORRECTED ON 03/04 AT 1414: PREVIOUSLY REPORTED AS 0.03 CRITICAL RESULT CALLED TO, READ BACK BY AND VERIFIED WITH: Shelly Rubenstein, RN AT 1119 ON 12/19/2018 BY SAINVILUS S   Brain natriuretic peptide     Status: Abnormal   Collection Time: 12/20/18 10:00 AM  Result Value Ref Range   B Natriuretic Peptide 209.7 (H) 0.0 - 100.0 pg/mL    Comment: Performed at [facility], [address], [place], [place] [address]  Comprehensive metabolic panel     Status: Abnormal   Collection Time: 12/20/18 10:00 AM  Result Value Ref Range   Sodium 137 135 - 145 mmol/L   Potassium 3.7 3.5 - 5.1 mmol/L   Chloride 104 98 - 111 mmol/L   CO2 22 22 - 32 mmol/L   Glucose, Bld 115 (H) 70 - 99 mg/dL   BUN 13 8 - 23 mg/dL   Creatinine, Ser 1.08 0.61 - 1.24 mg/dL   Calcium 8.9 8.9 - 10.3 mg/dL   Total Protein 6.6 6.5 - 8.1 g/dL   Albumin 3.2 (L) 3.5 - 5.0 g/dL   AST 29 15 - 41 U/L   ALT 33 0 - 44 U/L   Alkaline Phosphatase 85 38 - 126 U/L   Total Bilirubin 1.1 0.3 - 1.2 mg/dL   GFR calc non Af Amer >60 >60 mL/min   GFR calc Af Amer >60 >60 mL/min   Anion gap 11 5 - 15    Comment: Performed at [facility] [address] [place], [place] [address]  CBC with Differential     Status: Abnormal   Collection Time: 12/20/18 10:00 AM  Result Value Ref Range   WBC 5.7 4.0 - 10.5 K/uL   RBC 3.75 (L) 4.22 - 5.81 MIL/uL   Hemoglobin 13.0 13.0 - 17.0 g/dL   HCT 38.4 (L) 39.0 - 52.0 %   MCV 102.4 (H) 80.0 - 100.0 fL   MCH 34.7 (H) 26.0 - 34.0 pg   MCHC 33.9 30.0 - 36.0 g/dL   RDW 13.7 11.5 - 15.5 %   Platelets 265 150 - 400 K/uL   nRBC 0.0 0.0 - 0.2 %   Neutrophils Relative % 60 %   Neutro Abs 3.5 1.7 - 7.7 K/uL   Lymphocytes Relative 24 %   Lymphs Abs 1.4  0.7 - 4.0 K/uL   Monocytes Relative 11 %   Monocytes Absolute 0.6 0.1 - 1.0 K/uL   Eosinophils Relative 3 %   Eosinophils Absolute 0.2 0.0 - 0.5 K/uL   Basophils Relative 1 %   Basophils Absolute 0.0 0.0 - 0.1 K/uL   Immature Granulocytes 1 %   Abs Immature Granulocytes 0.04 0.00 - 0.07 K/uL    Comment: Performed at [facility] [address] [place], [place] [address]  Urinalysis, Routine w reflex microscopic     Status: Abnormal   Collection Time: 12/20/18 10:25 AM  Result Value Ref Range   Color, Urine STRAW (A) YELLOW   APPearance CLEAR CLEAR   Specific Gravity, Urine 1.004 (L) 1.005 - 1.030   pH 6.0 5.0 - 8.0   Glucose, UA NEGATIVE NEGATIVE mg/dL   Hgb urine dipstick NEGATIVE NEGATIVE   Bilirubin Urine NEGATIVE NEGATIVE   Ketones, ur NEGATIVE NEGATIVE mg/dL  Protein, ur NEGATIVE NEGATIVE mg/dL   Nitrite NEGATIVE NEGATIVE   Leukocytes,Ua NEGATIVE NEGATIVE    Comment: Performed at [facility], [place] [address], [place], [place] [address]  Urine rapid drug screen (hosp performed)     Status: None   Collection Time: 12/20/18 10:25 AM  Result Value Ref Range   Opiates NONE DETECTED NONE DETECTED   Cocaine NONE DETECTED NONE DETECTED   Benzodiazepines NONE DETECTED NONE DETECTED   Amphetamines NONE DETECTED NONE DETECTED   Tetrahydrocannabinol NONE DETECTED NONE DETECTED   Barbiturates NONE DETECTED NONE DETECTED    Comment: (NOTE) DRUG SCREEN FOR MEDICAL PURPOSES ONLY.  IF CONFIRMATION IS NEEDED FOR ANY PURPOSE, NOTIFY LAB WITHIN 5 DAYS. LOWEST DETECTABLE LIMITS FOR URINE DRUG SCREEN Drug Class                     Cutoff (ng/mL) Amphetamine and metabolites    1000 Barbiturate and metabolites    200 Benzodiazepine                 841 Tricyclics and metabolites     300 Opiates and metabolites        300 Cocaine and metabolites        300 THC                            50 Performed at [facility], [place] [address] [place], [place] [address]   CBG  monitoring, ED     Status: Abnormal   Collection Time: 12/20/18  3:24 PM  Result Value Ref Range   Glucose-Capillary 111 (H) 70 - 99 mg/dL  Hemoglobin A1c     Status: None   Collection Time: 12/21/18  5:28 AM  Result Value Ref Range   Hgb A1c MFr Bld 5.5 4.8 - 5.6 %    Comment: (NOTE) Pre diabetes:          5.7%-6.4% Diabetes:              >6.4% Glycemic control for   <7.0% adults with diabetes    Mean Plasma Glucose 111.15 mg/dL    Comment: Performed at [facility] [address] [place], [place] [address]  Lipid panel     Status: Abnormal   Collection Time: 12/21/18  5:28 AM  Result Value Ref Range   Cholesterol 79 0 - 200 mg/dL   Triglycerides 54 <150 mg/dL   HDL 26 (L) >40 mg/dL   Total CHOL/HDL Ratio 3.0 RATIO   VLDL 11 0 - 40 mg/dL   LDL Cholesterol 42 0 - 99 mg/dL    Comment:        Total Cholesterol/HDL:CHD Risk Coronary Heart Disease Risk Table                     Men   Women  1/2 Average Risk   3.4   3.3  Average Risk       5.0   4.4  2 X Average Risk   9.6   7.1  3 X Average Risk  23.4   11.0        Use the calculated Patient Ratio above and the CHD Risk Table to determine the patient's CHD Risk.        ATP III CLASSIFICATION (LDL):  <100     mg/dL   Optimal  100-129  mg/dL   Near or Above  Optimal  130-159  mg/dL   Borderline  160-189  mg/dL   High  >190     mg/dL   Very High Performed at [facility] [address], [place], [place] [address]   Vitamin B12     Status: None   Collection Time: 12/21/18  5:28 AM  Result Value Ref Range   Vitamin B-12 309 180 - 914 pg/mL    Comment: (NOTE) This assay is not validated for testing neonatal or myeloproliferative syndrome specimens for Vitamin B12 levels. Performed at [facility], [place] [address] [place], [place] [address]     Dg Chest 2 View  Result Date: 12/20/2018 CLINICAL DATA:  Cough, dyspnea and new onset of atrial fibrillation. EXAM: CHEST - 2 VIEW  COMPARISON:  Chest x-ray dated 09/12/2018 FINDINGS: There are new small bilateral pleural effusions. There is slight prominence of the pulmonary vascularity on the lateral view. Heart size is within normal limits considering the AP technique. No acute bone abnormality. IMPRESSION: New small bilateral pleural effusions. Prominent pulmonary vascularity demonstrated on the lateral view. Electronically Signed   By: Lorriane Shire M.D.   On: 12/20/2018 11:25   Ct Head Wo Contrast  Result Date: 12/20/2018 CLINICAL DATA:  68 year old male with altered mental status, in atrial fibrillation on arrival. EXAM: CT HEAD WITHOUT CONTRAST TECHNIQUE: Contiguous axial images were obtained from the base of the skull through the vertex without intravenous contrast. COMPARISON:  Orbit radiographs 11/07/2014. FINDINGS: Brain: No midline shift, ventriculomegaly, mass effect, evidence of mass lesion, intracranial hemorrhage or evidence of cortically based acute infarction. Gray-white matter differentiation is within normal limits throughout the brain. Cerebral volume is within normal limits for age. Vascular: Mild Calcified atherosclerosis at the skull base. No suspicious intracranial vascular hyperdensity. Skull: Negative. Sinuses/Orbits: Paranasal Visualized paranasal sinuses and mastoids are well pneumatized. Other: No acute orbit or scalp soft tissue findings. IMPRESSION: Normal for age non contrast CT appearance of the brain. Electronically Signed   By: Genevie Ann M.D.   On: 12/20/2018 12:07   Mr [name] Head Wo Contrast  Result Date: 12/20/2018 CLINICAL DATA:  68 y/o M; altered mental status and atrial fibrillation on arrival. EXAM: MRI HEAD WITHOUT CONTRAST MRA HEAD WITHOUT CONTRAST TECHNIQUE: Multiplanar, multiecho pulse sequences of the brain and surrounding structures were obtained without intravenous contrast. Angiographic images of the head were obtained using MRA technique without contrast. COMPARISON:  12/20/2018 CT head  FINDINGS: MRI HEAD FINDINGS Brain: No acute infarction, hemorrhage, hydrocephalus, extra-axial collection or mass effect. 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 Few punctate nonspecific T2 FLAIR hyperintensities in subcortical and periventricular white matter are compatible with minimal chronic microvascular ischemic changes. 2-3 mm subependymal nodules (4 identified, series 7, image 18-19) of the right lateral ventricle. Vascular: Normal flow voids. Skull and upper cervical spine: Normal marrow signal. Sinuses/Orbits: Negative. Other: None. MRA HEAD FINDINGS Anterior circulation: No large vessel occlusion, aneurysm, or significant stenosis is identified. Posterior circulation: No large vessel occlusion, aneurysm, or significant stenosis is identified. Anatomic variation: None significant. IMPRESSION: 1. No acute intracranial abnormality. 2. Minimal chronic microvascular ischemic changes of white matter. 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 3. 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 4. Normal MRA of the head. Electronically Signed   By: Kristine Garbe M.D.   On: 12/20/2018 18:22   Mr Brain Wo Contrast  Result Date: 12/20/2018  CLINICAL DATA:  68 y/o M; altered mental status and atrial fibrillation on arrival. EXAM: MRI HEAD WITHOUT CONTRAST MRA HEAD WITHOUT CONTRAST TECHNIQUE: Multiplanar, multiecho pulse sequences of the brain and surrounding structures were obtained without intravenous contrast. Angiographic images of the head were obtained using MRA technique without contrast. COMPARISON:  12/20/2018 CT head FINDINGS: MRI HEAD FINDINGS Brain: No acute infarction, hemorrhage, hydrocephalus, extra-axial collection or mass effect. 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 Few punctate nonspecific T2 FLAIR  hyperintensities in subcortical and periventricular white matter are compatible with minimal chronic microvascular ischemic changes. 2-3 mm subependymal nodules (4 identified, series 7, image 18-19) of the right lateral ventricle. Vascular: Normal flow voids. Skull and upper cervical spine: Normal marrow signal. Sinuses/Orbits: Negative. Other: None. MRA HEAD FINDINGS Anterior circulation: No large vessel occlusion, aneurysm, or significant stenosis is identified. Posterior circulation: No large vessel occlusion, aneurysm, or significant stenosis is identified. Anatomic variation: None significant. IMPRESSION: 1. No acute intracranial abnormality. 2. Minimal chronic microvascular ischemic changes of white matter. 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 3. 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 4. Normal MRA of the head. Electronically Signed   By: Kristine Garbe M.D.   On: 12/20/2018 18:22   Vas US Carotid (at [facility] Only)  Result Date: 12/21/2018 Carotid Arterial Duplex Study Indications:       TIA and Altered mental status, shaking. Risk Factors:      Hypertension, coronary artery disease. Other Factors:     Atrial fibrillation, OSA. Limitations:       Body habitus, depth of vessels, breathing interference Comparison Study:  Prior study from 12/15/10 is available for comparison Performing Technologist: Sharion Dove RVS  Examination Guidelines: A complete evaluation includes B-mode imaging, spectral Doppler, color Doppler, and power Doppler as needed of all accessible portions of each vessel. Bilateral testing is considered an integral part of a complete examination. Limited examinations for reoccurring indications may be performed as noted.  Right Carotid Findings: +----------+--------+--------+--------+--------+--------+           PSV cm/sEDV  cm/sStenosisDescribeComments +----------+--------+--------+--------+--------+--------+ CCA Prox  137     29                               +----------+--------+--------+--------+--------+--------+ CCA Distal83      22                               +----------+--------+--------+--------+--------+--------+ ICA Prox  70      13                               +----------+--------+--------+--------+--------+--------+ ICA Distal32      8                                +----------+--------+--------+--------+--------+--------+ ECA       160     20                               +----------+--------+--------+--------+--------+--------+ +----------+--------+-------+--------+-------------------+           PSV cm/sEDV cmsDescribeArm Pressure (mmHG) +----------+--------+-------+--------+-------------------+ [id number]                                         +----------+--------+-------+--------+-------------------+  Left Carotid Findings: +----------+--------+--------+--------+--------+--------+           PSV cm/sEDV cm/sStenosisDescribeComments +----------+--------+--------+--------+--------+--------+ CCA Prox  68      18                               +----------+--------+--------+--------+--------+--------+ CCA Distal46      8                                +----------+--------+--------+--------+--------+--------+ ICA Prox  39      13                               +----------+--------+--------+--------+--------+--------+ ICA Distal43      19                               +----------+--------+--------+--------+--------+--------+ +----------+--------+--------+--------+-------------------+ SubclavianPSV cm/sEDV cm/sDescribeArm Pressure (mmHG) +----------+--------+--------+--------+-------------------+           78                                          +----------+--------+--------+--------+-------------------+  Summary: Right  Carotid: The extracranial vessels were near-normal with only minimal wall                thickening or plaque. Left Carotid: The extracranial vessels were near-normal with only minimal wall               thickening or plaque. Vertebrals:  Bilateral vertebral arteries were not visualized. Subclavians: Normal flow hemodynamics were seen in bilateral subclavian              arteries. *See table(s) above for measurements and observations.     Preliminary     Review of Systems  Constitutional: Negative for chills, fever, malaise/fatigue and weight loss.  HENT: Negative.   Eyes: Negative.   Respiratory: Positive for shortness of breath.   Cardiovascular: Positive for orthopnea. Negative for chest pain and leg swelling.  Gastrointestinal: Negative.   Genitourinary: Negative.   Musculoskeletal: Negative.   Skin: Negative.   Neurological: Positive for dizziness and tremors. Negative for loss of consciousness.  Psychiatric/Behavioral: Negative.    Blood pressure 130/83, pulse 95, temperature 98.2 F (36.8 C), temperature source Oral, resp. rate 18, SpO2 98 %. Physical Exam  Constitutional: He is oriented to person, place, and time.  Obese gentleman in no distress  HENT:  Head: Normocephalic and atraumatic.  Eyes: Pupils are equal, round, and reactive to light. EOM are normal.  Neck: Normal range of motion. No JVD present.  Cardiovascular: Normal rate, regular rhythm, normal heart sounds and intact distal pulses.  No murmur heard. Respiratory: Breath sounds normal. No respiratory distress. He has no wheezes. He has no rales.  GI: Soft. Bowel sounds are normal. There is no abdominal tenderness.  Protuberant belly  Musculoskeletal:        General: No edema.  Lymphadenopathy:    He has no cervical adenopathy.  Neurological: He is alert and oriented to person, place, and time.  Skin: Skin is warm and dry.  Psychiatric: He has a normal mood and affect.   ECHOCARDIOGRAM LIMITED REPORT        Patient Name:  [name] Date of Exam: 12/21/2018 Medical Rec #:  [id number]      Height:       69.0 in Accession #:    [id number]     Weight:       266.0 lb Date of Birth:  04/21/1951       BSA:          2.33 m Patient Age:    53 years       BP:           109/84 mmHg Patient Gender: M              HR:           102 bpm. Exam Location:  Inpatient    Procedure: 2D Echo and Intracardiac Opacification Agent  Indications:    TIA 435.9 / G45.9   History:        Patient has prior history of Echocardiogram examinations, most                 recent 12/06/2018. STEMI, Atrial Fibrillation; Signs/Symptoms:                 Shortness of Breath; Risk Factors: Diabetes, Hypertension and                 Obesity. Pericardial effusion.   Sonographer:    Talmage Coin Referring Phys: [place]    1. The left ventricle has hyperdynamic systolic function, with an ejection fraction of >65%. Left ventricular diastology could not be evaluated due to nondiagnostic images.  2. The mitral valve is normal in structure.  3. Aortic valve regurgitation is mild by color flow Doppler.  4. Moderate pericardial effusion.  5. The pericardial effusion is circumferential.  6. There is inversion of the right ventricular wall, inversion of the right atrial wall and excessive respiratory variation in the mitral valve spectral Doppler velocities.  7. When compared to the prior study: Compared to study 11/2018, the pericardial effusion has incrased in size and now significant effusion present posterior as well as anteriorly. There is RA inversion and suggestive of subtle intermittent RV diastolic  collapse. There is some respiratory variation in the [facility] inlow pattern with respirations. The IVC is not well visualized. Concern for early tamponade.  FINDINGS  Left Ventricle: The left ventricle has hyperdynamic systolic function, with an ejection fraction of >65%. Left ventricular diastology  could not be evaluated due to nondiagnostic images. Definity contrast agent was given IV to delineate the left  ventricular endocardial borders. Pericardium: A moderately sized pericardial effusion is present. The pericardial effusion is circumferential. There is inversion of the right ventricular wall, inversion of the right atrial wall and excessive respiratory variation in the mitral valve  spectral Doppler velocities. Mitral Valve: The mitral valve is normal in structure. Mitral valve regurgitation is trivial by color flow Doppler. Aortic Valve: Aortic valve regurgitation is mild by color flow Doppler. Compared to previous exam: Compared to study 11/2018, the pericardial effusion has incrased in size and now significant effusion present posterior as well as anteriorly. There is RA inversion and suggestive of mild RV diastolic collapse. There is some  respiratory variation in the [facility] inlow pattern with respirations. The IVC is not well visualized. Concern for early tamponade.   LEFT VENTRICLE PLAX 2D (Teich) LV EF:          45.9 % LVIDd:          4.40 cm  LVIDs:          3.40 cm LV PW:          0.70 cm LV IVS:         0.70 cm LV SV:          40 ml  LEFT ATRIUM         Index LA diam:    4.00 cm 1.72 cm/m    AORTA Ao Root diam: 3.20 cm    [name] MD Electronically signed by [name] MD Signature Date/Time: 12/21/2018/1:29:22 PM    Assessment/Plan:  This 68 year old gentleman has a moderate pericardial effusion with early echo signs of tamponade although he has been hemodynamically stable and not currently having any symptoms since hospitalization.  The amount of pericardial effusion is increased from his initial event on 12/05/2018 when he had a wire perforation of a diagonal branch.  I suspect that he had retained blood in his pericardium which resulted in some pericarditis and further fluid production.  This may have been complicated by ongoing use of anticoagulants and  institution of Eliquis on Monday of this week.  I agree that the pericardial effusion should be drained.  This can be done by subxiphoid pericardial window although that is not an insignificant procedure in this morbidly obese gentleman with a large protuberant abdomen.  He received Eliquis this morning and I think it would be best to stop Eliquis for at least 48 hours before proceeding with pericardial window to minimize any bleeding complications.  It is a long distance from his skin to his pericardium and any bleeding will be difficult to visualize and correct.  He is currently asymptomatic and hemodynamically stable and I think it would be safe to wait until Monday morning to perform subxiphoid pericardial window.  If he develops any hemodynamic compromise or worse symptoms over the weekend it could be done more urgently.  I think it is okay to continue him on dual antiplatelet therapy for his recent stent if necessary and he could be placed on heparin while the Eliquis is washing out.  I discussed the operative procedure with the patient including alternatives, benefits, and risk including but not limited to bleeding, blood transfusion, infection, injury to the heart, possible need for median sternotomy to manage bleeding, and recurrent pericardial effusion.  He understands all this and agrees to proceed.  I will tentatively plan to do him on Monday morning.      [name], MD 12/21/2018, 7:20 PM

## 2018-12-21 NOTE — Progress Notes (Signed)
Reports given to Tabitha in 6E.

## 2018-12-21 NOTE — Progress Notes (Signed)
Physical Therapy Evaluation/ Discharge Patient Details Name: Devon Mathews. MRN: [id number] DOB: October 01, 1951 Today's Date: 12/21/2018   History of Present Illness  Patient is 68 y/o male admitted to hospital secondary to TIA likely from new onset afib. Per notes, patient had a recent complicated admission: PCI on 02/19/38 complicated by diagonal microperforation, hemorrhagic pericarditis, pericardial effusion, borderline cardiogenic shock, AKI. PMH includes CAD, HTN, OSA, NSTEMI, DM, and HLD.  Clinical Impression  Patient admitted to hospital secondary to problems above and with deficits below. Patient required supervision to ambulate without AD. No LOB noted during dynamic gait tasks. Patient HR increased to 166bpm briefly but decreased to upper 120 to low 130s with standing rest break. Returned to low 100s at end of session with seated rest. Educated patient and wife on BEFAST. Patient reports following up with cardiac rehab following d/c and MD approval. No further acute PT needs identified. All education has been completed and the patient has no further questions. See below for any follow-up Physical Therapy or equipment needs. PT is signing off. Thank you for this referral. If needs change, please re-consult.      Follow Up Recommendations No PT follow up    Equipment Recommendations  None recommended by PT    Recommendations for Other Services       Precautions / Restrictions Precautions Precautions: None Restrictions Weight Bearing Restrictions: No      Mobility  Bed Mobility               General bed mobility comments: Patient in chair upon arrival  Transfers Overall transfer level: Modified independent Equipment used: None             General transfer comment: Patient at modI level to stand without AD. Denies dizziness  Ambulation/Gait Ambulation/Gait assistance: Supervision Gait Distance (Feet): 250 Feet Assistive device: None Gait Pattern/deviations:  Step-through pattern Gait velocity: decreased Gait velocity interpretation: 1.31 - 2.62 ft/sec, indicative of limited community ambulator General Gait Details: Patient required supervision for ambulation without AD for safety. HR increased to 166 briefly but decreased with standing rest break. Patient denies dizziness, palpitations, lightheadedness, SOB while ambulating.   Stairs Stairs: Yes Stairs assistance: Min guard Stair Management: One rail Left;Alternating pattern;Forwards Number of Stairs: 4 General stair comments: Patient completed stair navigation with use of single hand rail and min guard for safety. No LOB noted. Patient reports feeling comfortable with stairs.   Wheelchair Mobility    Modified Rankin (Stroke Patients Only)       Balance Overall balance assessment: Needs assistance Sitting-balance support: Feet supported;No upper extremity supported Sitting balance-Leahy Scale: Good     Standing balance support: No upper extremity supported Standing balance-Leahy Scale: Good Standing balance comment: no LOB noted                 Standardized Balance Assessment Standardized Balance Assessment : Dynamic Gait Index   Dynamic Gait Index Level Surface: Normal Gait with Horizontal Head Turns: Normal Gait with Vertical Head Turns: Normal Step Over Obstacle: Normal Step Around Obstacles: Normal Steps: Mild Impairment       Pertinent Vitals/Pain Pain Assessment: Faces Faces Pain Scale: No hurt    Home Living Family/patient expects to be discharged to:: Private residence Living Arrangements: Spouse/significant other Available Help at Discharge: Family;Available PRN/intermittently Type of Home: House Home Access: Stairs to enter Entrance Stairs-Rails: Left Entrance Stairs-Number of Steps: 6 Home Layout: One level Home Equipment: None      Prior Function Level of  Independence: Independent         Comments: still working     Journalist, newspaper         Extremity/Trunk Assessment   Upper Extremity Assessment Upper Extremity Assessment: Overall WFL for tasks assessed    Lower Extremity Assessment Lower Extremity Assessment: Overall WFL for tasks assessed    Cervical / Trunk Assessment Cervical / Trunk Assessment: Normal  Communication   Communication: No difficulties  Cognition Arousal/Alertness: Awake/alert Behavior During Therapy: WFL for tasks assessed/performed Overall Cognitive Status: Within Functional Limits for tasks assessed                                        General Comments General comments (skin integrity, edema, etc.): Educated patient on BEFAST for signs and symptoms of stroke.     Exercises     Assessment/Plan    PT Assessment Patent does not need any further PT services  PT Problem List         PT Treatment Interventions      PT Goals (Current goals can be found in the Care Plan section)  Acute Rehab PT Goals Patient Stated Goal: go home PT Goal Formulation: With patient Time For Goal Achievement: 12/21/18 Potential to Achieve Goals: Good    Frequency     Barriers to discharge        Co-evaluation               AM-PAC PT "6 Clicks" Mobility  Outcome Measure Help needed turning from your back to your side while in a flat bed without using bedrails?: None Help needed moving from lying on your back to sitting on the side of a flat bed without using bedrails?: None Help needed moving to and from a bed to a chair (including a wheelchair)?: None Help needed standing up from a chair using your arms (e.g., wheelchair or bedside chair)?: None Help needed to walk in hospital room?: None Help needed climbing 3-5 steps with a railing? : A Little 6 Click Score: 23    End of Session Equipment Utilized During Treatment: Gait belt Activity Tolerance: Patient tolerated treatment well Patient left: in chair;with call bell/phone within reach;with family/visitor present Nurse  Communication: Mobility status PT Visit Diagnosis: Other abnormalities of gait and mobility (R26.89)    Time: 9702-6378 PT Time Calculation (min) (ACUTE ONLY): 15 min   Charges:   PT Evaluation $PT Eval Low Complexity: 1 Low          Erick Blinks, SPT  Erick Blinks 12/21/2018, 2:57 PM

## 2018-12-21 NOTE — Progress Notes (Signed)
Pt refused CPAP, on 2liter of oxygen and continuous pulse ox.

## 2018-12-21 NOTE — Significant Event (Addendum)
Rapid Response Event Note  Overview: Triage - Pericardial Effusion   Initial Focused Assessment: Patient is s/p a recent cardiac catheterization - wire perforation - developed pericardial tamponade. Patient was admitted this admission for confusion, disoriention, RT arm/hand shaking - resolved quickly per patient and CHF symptoms. TIA workup was started. Upon arrival, he states he feels better, no longer short of breath, denies chest pain. Neuro intact. HR - low 100s, + pericardial rub, skin warm and dry. Patient was pleasant and conversant. VSS.  Interventions: - NO RRT Interventions   Plan of Care: - Patient to transfer to Cardiac PCU/SDU when bed is available. Since patient's neurologic symptoms have resolved, transferring patient to cardiac PCU/SDU is very appropriate. Patient made aware of this and is in agreement.  - 6E RN called and made aware.  - Call RR RN if needed.  Event Summary:    at     [place], Delice Lesch

## 2018-12-21 NOTE — Progress Notes (Signed)
SLP Cancellation Note  Patient Details Name: Devon Mathews. MRN: [id number] DOB: [birth year]   Cancelled treatment:       Reason Eval/Treat Not Completed: Patient at procedure or test/unavailable   Aleks Nawrot, Katherene Ponto 12/21/2018, 12:07 PM

## 2018-12-21 NOTE — Progress Notes (Addendum)
Progress Note  Patient Name: Devon Mathews. Date of Encounter: 12/21/2018  Primary Cardiologist: Skeet Latch, MD  / Glenetta Hew, MD  Subjective   Feels OK today -- no more dizziness or confusion.   NO CP or SOB.  Inpatient Medications    Scheduled Meds: . atorvastatin  80 mg Oral q1800  . [START ON 12/22/2018] clopidogrel  75 mg Oral Daily  . colchicine  0.6 mg Oral BID  . [START ON 12/22/2018] furosemide  40 mg Oral Daily  . metoprolol succinate  75 mg Oral Q lunch  . pantoprazole  40 mg Oral QHS  . potassium chloride  10 mEq Oral Daily   Continuous Infusions:  PRN Meds: acetaminophen, amitriptyline, methocarbamol, nitroGLYCERIN, senna-docusate   Vital Signs    Vitals:   12/21/18 0325 12/21/18 0730 12/21/18 1213 12/21/18 1441  BP: 128/77 109/84 106/61 115/80  Pulse: (!) 108 87 (!) 108 (!) 110  Resp: 18 18 18    Temp: 98.3 F (36.8 C) 98.4 F (36.9 C) 98.3 F (36.8 C)   TempSrc: Oral Oral Oral   SpO2: 98% 95% 97%     Intake/Output Summary (Last 24 hours) at 12/21/2018 1446 Last data filed at 12/21/2018 0900 Gross per 24 hour  Intake 360 ml  Output 1450 ml  Net -1090 ml   Last 3 Weights 12/15/2018 12/08/2018 12/07/2018  Weight (lbs) 266 lb 265 lb 12.8 oz 267 lb 11.2 oz  Weight (kg) 120.657 kg 120.566 kg 121.428 kg      Telemetry    Remains in A. fib heart rates from the 90s to 120s depending on activity- Personally Reviewed  ECG    No new EKG- Personally Reviewed  Physical Exam   GEN: No acute distress.  Sitting in chair comfortably. Neck: No JVD or carotid bruit Cardiac:  Borderline tachycardic irregularly irregular rhythm., no murmurs, rubs, or gallops.  Respiratory: Clear to auscultation bilaterally.  Nonlabored, good air movement GI: Soft, nontender, non-distended.  Obese MS: No edema; No deformity. Neuro:  Nonfocal  Psych: Normal affect   Labs    Chemistry Recent Labs  Lab 12/20/18 1000  NA 137  K 3.7  CL 104  CO2 22  GLUCOSE 115*    BUN 13  CREATININE 1.08  CALCIUM 8.9  PROT 6.6  ALBUMIN 3.2*  AST 29  ALT 33  ALKPHOS 85  BILITOT 1.1  GFRNONAA >60  GFRAA >60  ANIONGAP 11     Hematology Recent Labs  Lab 12/20/18 1000  WBC 5.7  RBC 3.75*  HGB 13.0  HCT 38.4*  MCV 102.4*  MCH 34.7*  MCHC 33.9  RDW 13.7  PLT 265    Cardiac Enzymes Recent Labs  Lab 12/20/18 1000  TROPONINI 0.03*   No results for input(s): TROPIPOC in the last 168 hours.   BNP Recent Labs  Lab 12/20/18 1000  BNP 209.7*     DDimer No results for input(s): DDIMER in the last 168 hours.   Radiology    Dg Chest 2 View  Result Date: 12/20/2018 CLINICAL DATA:  Cough, dyspnea and new onset of atrial fibrillation. EXAM: CHEST - 2 VIEW COMPARISON:  Chest x-ray dated 09/12/2018 FINDINGS: There are new small bilateral pleural effusions. There is slight prominence of the pulmonary vascularity on the lateral view. Heart size is within normal limits considering the AP technique. No acute bone abnormality. IMPRESSION: New small bilateral pleural effusions. Prominent pulmonary vascularity demonstrated on the lateral view. Electronically Signed   By: Jeneen Rinks  Maxwell M.D.   On: 12/20/2018 11:25   Ct Head Wo Contrast  Result Date: 12/20/2018 CLINICAL DATA:  68 year old male with altered mental status, in atrial fibrillation on arrival. EXAM: CT HEAD WITHOUT CONTRAST TECHNIQUE: Contiguous axial images were obtained from the base of the skull through the vertex without intravenous contrast. COMPARISON:  Orbit radiographs 11/07/2014. FINDINGS: Brain: No midline shift, ventriculomegaly, mass effect, evidence of mass lesion, intracranial hemorrhage or evidence of cortically based acute infarction. Gray-white matter differentiation is within normal limits throughout the brain. Cerebral volume is within normal limits for age. Vascular: Mild Calcified atherosclerosis at the skull base. No suspicious intracranial vascular hyperdensity. Skull: Negative.  Sinuses/Orbits: Paranasal Visualized paranasal sinuses and mastoids are well pneumatized. Other: No acute orbit or scalp soft tissue findings. IMPRESSION: Normal for age non contrast CT appearance of the brain. Electronically Signed   By: Genevie Ann M.D.   On: 12/20/2018 12:07   Mr [name] Head Wo Contrast  Result Date: 12/20/2018 CLINICAL DATA:  68 y/o M; altered mental status and atrial fibrillation on arrival. EXAM: MRI HEAD WITHOUT CONTRAST MRA HEAD WITHOUT CONTRAST TECHNIQUE: Multiplanar, multiecho pulse sequences of the brain and surrounding structures were obtained without intravenous contrast. Angiographic images of the head were obtained using MRA technique without contrast. COMPARISON:  12/20/2018 CT head FINDINGS: MRI HEAD FINDINGS Brain: No acute infarction, hemorrhage, hydrocephalus, extra-axial collection or mass effect. 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 Few punctate nonspecific T2 FLAIR hyperintensities in subcortical and periventricular white matter are compatible with minimal chronic microvascular ischemic changes. 2-3 mm subependymal nodules (4 identified, series 7, image 18-19) of the right lateral ventricle. Vascular: Normal flow voids. Skull and upper cervical spine: Normal marrow signal. Sinuses/Orbits: Negative. Other: None. MRA HEAD FINDINGS Anterior circulation: No large vessel occlusion, aneurysm, or significant stenosis is identified. Posterior circulation: No large vessel occlusion, aneurysm, or significant stenosis is identified. Anatomic variation: None significant. IMPRESSION: 1. No acute intracranial abnormality. 2. Minimal chronic microvascular ischemic changes of white matter. 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 3. 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 4. Normal MRA of the head. Electronically Signed   By: Kristine Garbe M.D.   On: 12/20/2018 18:22   Mr Brain Wo Contrast  Result Date: 12/20/2018 CLINICAL DATA:  68 y/o M; altered mental status and atrial fibrillation on arrival. EXAM: MRI HEAD WITHOUT CONTRAST MRA HEAD WITHOUT CONTRAST TECHNIQUE: Multiplanar, multiecho pulse sequences of the brain and surrounding structures were obtained without intravenous contrast. Angiographic images of the head were obtained using MRA technique without contrast. COMPARISON:  12/20/2018 CT head FINDINGS: MRI HEAD FINDINGS Brain: No acute infarction, hemorrhage, hydrocephalus, extra-axial collection or mass effect. 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 Few punctate nonspecific T2 FLAIR hyperintensities in subcortical and periventricular white matter are compatible with minimal chronic microvascular ischemic changes. 2-3 mm subependymal nodules (4 identified, series 7, image 18-19) of the right lateral ventricle. Vascular: Normal flow voids. Skull and upper cervical spine: Normal marrow signal. Sinuses/Orbits: Negative. Other: None. MRA HEAD FINDINGS Anterior circulation: No large vessel occlusion, aneurysm, or significant stenosis is identified. Posterior circulation: No large vessel occlusion, aneurysm, or significant stenosis is identified. Anatomic variation: None significant. IMPRESSION: 1. No acute intracranial abnormality. 2. Minimal chronic microvascular ischemic changes of white matter. 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 3. 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 4. Normal MRA of the head. Electronically Signed   By: Kristine Garbe M.D.   On: 12/20/2018 18:22   Vas US Carotid (at [place] Only)  Result Date: 12/21/2018 Carotid Arterial Duplex Study Indications:       TIA and Altered mental status, shaking. Risk Factors:      Hypertension,  coronary artery disease. Other Factors:     Atrial fibrillation, OSA. Limitations:       Body habitus, depth of vessels, breathing interference Comparison Study:  Prior study from 12/15/10 is available for comparison Performing Technologist: Sharion Dove RVS  Examination Guidelines: A complete evaluation includes B-mode imaging, spectral Doppler, color Doppler, and power Doppler as needed of all accessible portions of each vessel. Bilateral testing is considered an integral part of a complete examination. Limited examinations for reoccurring indications may be performed as noted.  Right Carotid Findings: +----------+--------+--------+--------+--------+--------+           PSV cm/sEDV cm/sStenosisDescribeComments +----------+--------+--------+--------+--------+--------+ CCA Prox  137     29                               +----------+--------+--------+--------+--------+--------+ CCA Distal83      22                               +----------+--------+--------+--------+--------+--------+ ICA Prox  70      13                               +----------+--------+--------+--------+--------+--------+ ICA Distal32      8                                +----------+--------+--------+--------+--------+--------+ ECA       160     20                               +----------+--------+--------+--------+--------+--------+ +----------+--------+-------+--------+-------------------+           PSV cm/sEDV cmsDescribeArm Pressure (mmHG) +----------+--------+-------+--------+-------------------+ [id number]                                         +----------+--------+-------+--------+-------------------+  Left Carotid Findings: +----------+--------+--------+--------+--------+--------+           PSV cm/sEDV cm/sStenosisDescribeComments +----------+--------+--------+--------+--------+--------+ CCA Prox  68      18                                +----------+--------+--------+--------+--------+--------+ CCA Distal46      8                                +----------+--------+--------+--------+--------+--------+ ICA Prox  39      13                               +----------+--------+--------+--------+--------+--------+ ICA Distal43      19                               +----------+--------+--------+--------+--------+--------+ +----------+--------+--------+--------+-------------------+  SubclavianPSV cm/sEDV cm/sDescribeArm Pressure (mmHG) +----------+--------+--------+--------+-------------------+           78                                          +----------+--------+--------+--------+-------------------+  Summary: Right Carotid: The extracranial vessels were near-normal with only minimal wall                thickening or plaque. Left Carotid: The extracranial vessels were near-normal with only minimal wall               thickening or plaque. Vertebrals:  Bilateral vertebral arteries were not visualized. Subclavians: Normal flow hemodynamics were seen in bilateral subclavian              arteries. *See table(s) above for measurements and observations.     Preliminary     Cardiac Studies    Echocardiogram 12/21/2018: (Personally reviewed) hyperdynamic LV with EF greater than 65%.  Moderate circumferential pericardial effusion.  Mild inversion of the RV wall and RA wall with excessive respiratory variation in mitral valve velocities.  Pericardial effusion is notably larger than most recent postprocedural echocardiogram.  There is concern for possible early tamponade physiology  Patient Profile     Alfonse Garringer. is a 68 y.o. male with a hx of CAD-PCI with recent hospitalization following PTCA complicated by guidewire perforation (on February 18) who is being seen today for the evaluation of new onset A. fib with possible TIA and CHF (would be diastolic - given normal EF) at the request of Dr. Gilles Chiquito. Seen by  neurology.  Not certain if this was a TIA. Echocardiogram reviewed 12/21/2018 showing worsening pericardial effusion.  Assessment & Plan    Principal Problem:   TIA (transient ischemic attack) Active Problems:   CAD S/P percutaneous coronary angioplasty   Atrial fibrillation with RVR (HCC)   Pericardial effusion without cardiac tamponade   Essential hypertension   Lower extremity edema   Shortness of breath   OSA (obstructive sleep apnea)   History of non-ST elevation myocardial infarction (NSTEMI)   Hyperlipidemia LDL goal <70  Possible TIA symptoms seem to have resolved.  No recurrent symptoms.  So far work-up has been negative. -->  Still cannot exclude A. fib related thromboembolism.  Concern today is enlarging pericardial effusion.  There appears to be some signs on the echocardiogram to suggest early tamponade physiology, however clinically he is not in tamponade.  He remains in A. fib with rates in the 90s to 1 teens, but is hemodynamic stable with normal blood pressure. -- I suspect that AFib has resulted in transient Diastolic Dysfunction related HFpEF, felt better after diuresis.  However, with concern for enlarging pericardial effusion, recommend holding diuresis.  I have reviewed the echo with Dr. Irish Lack.  We both agree that given the difficulty with attempted pericardiocentesis at the time of his initial iatrogenic guidewire perforation that another attempted pericardiocentesis is is warranted.  There is concern also with him now being on Plavix plus Eliquis. -->  Pericardial window is likely a safer option.  His last dose of Eliquis unfortunately was this morning at roughly 9 AM and therefore be out of his system by 9 AM to 10 AM tomorrow.  He is on Plavix having been loaded with thoughts of converting from Brilinta to Plavix in conjunction with DOAC.  Aspirin has been discontinued.  With borderline pressures, will just give 25 mg of Toprol as opposed to 75 today.  Will need  to monitor, low threshold to consider amiodarone temporarily. Unfortunately, we are not able to be protected at this point with Eliquis because of concern for enlarging effusion potentially being hemorrhagic with recent pericarditis.  It is unlikely that this would be from the perforation as there was no flow in that distal vessel segment upon completion of prolonged balloon inflation.  Recommendation:  Have consulted CVTS to consider possibility of pericardial window (this is not urgent today,, but would be preferable to attempted pericardiocentesis)  I have asked to transfer the patient to cardiac progressive care unit for closer monitoring.  Reduced dose of Toprol to 25 mg for now to avoid hypotension.  May require amiodarone for rate control.  For now stop Eliquis, however we need to continue Plavix -> restarting Eliquis would be based on CT surgical recommendations  Continue colchicine  We will hold diuretic  Continue statin     Recommendations have been reviewed with Dr. Algis Liming.  I reviewed the plan with the patient and his wife.  They understand the new findings and my recommendations.  Will await CT surgical consultation.  On call/evening team notified as I will not be present this evening.  (I am available by cell phone)   For questions or updates, please contact [place] Please consult [email or web address] for contact info under        Signed, Glenetta Hew, MD  12/21/2018, 2:46 PM

## 2018-12-21 NOTE — Telephone Encounter (Signed)
Called and spoke with pt in regards to CR, pt stated he is currently is the hospital from an event that happen on 12/20/2018. Adv CR RN navigator, will hold off on scheduling patient for CR.  Placed in Medical on bin.

## 2018-12-22 ENCOUNTER — Ambulatory Visit (HOSPITAL_COMMUNITY): Payer: BLUE CROSS/BLUE SHIELD

## 2018-12-22 ENCOUNTER — Telehealth: Payer: Self-pay | Admitting: Cardiology

## 2018-12-22 ENCOUNTER — Telehealth: Payer: Self-pay

## 2018-12-22 DIAGNOSIS — I4891 Unspecified atrial fibrillation: Secondary | ICD-10-CM

## 2018-12-22 DIAGNOSIS — I314 Cardiac tamponade: Secondary | ICD-10-CM

## 2018-12-22 LAB — FOLATE RBC
Folate, Hemolysate: 381 ng/mL
Folate, RBC: 1052 ng/mL (ref >498)
Hematocrit: 36.2 % — ABNORMAL LOW (ref 37.5–51.0)

## 2018-12-22 IMAGING — DX DG CHEST 2V
2 series · 2 of 2 positions shown · non-contrast
Comparison: 06/23/2008

CLINICAL DATA: Chest pain

EXAM:
CHEST - 2 VIEW

[chest lat]
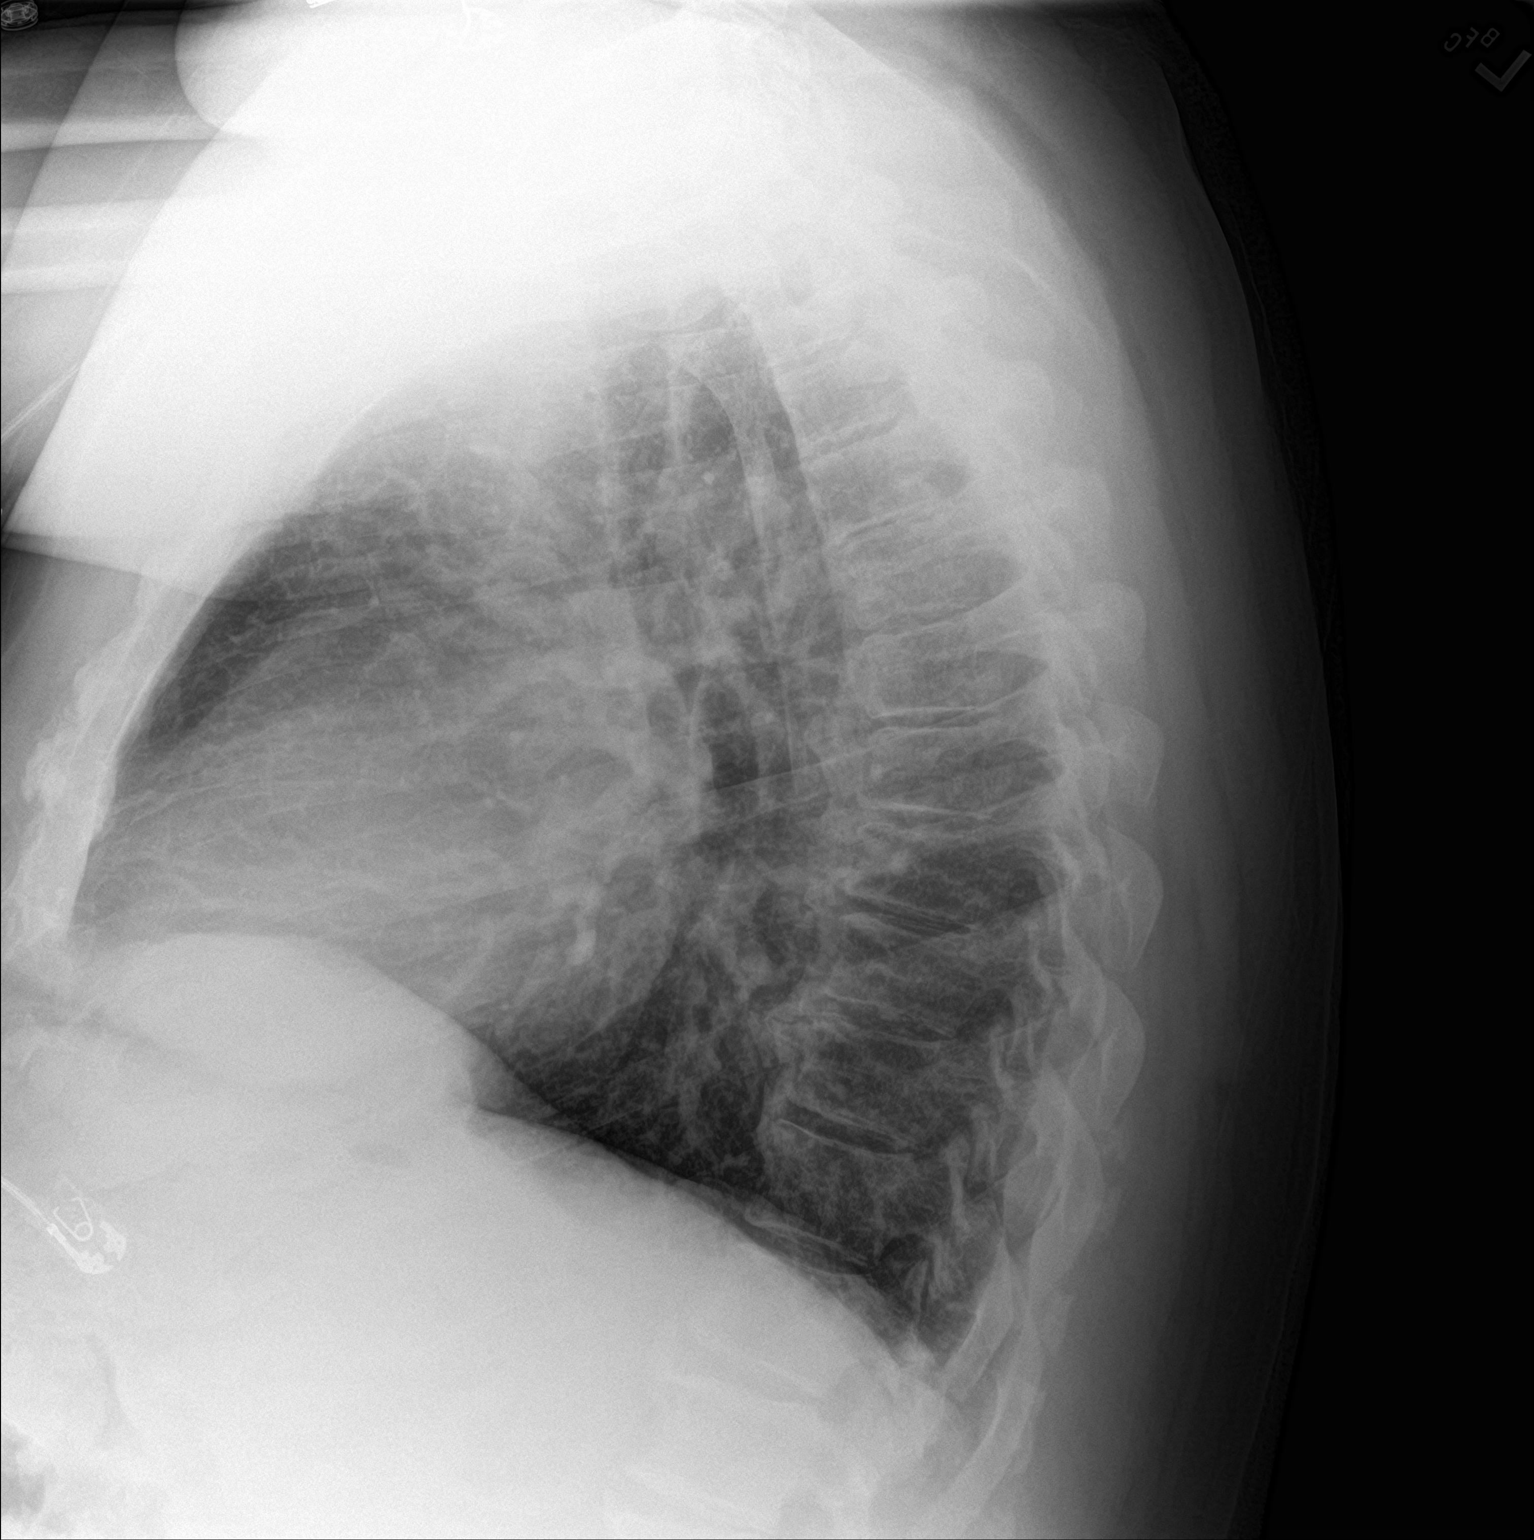

[chest pa]
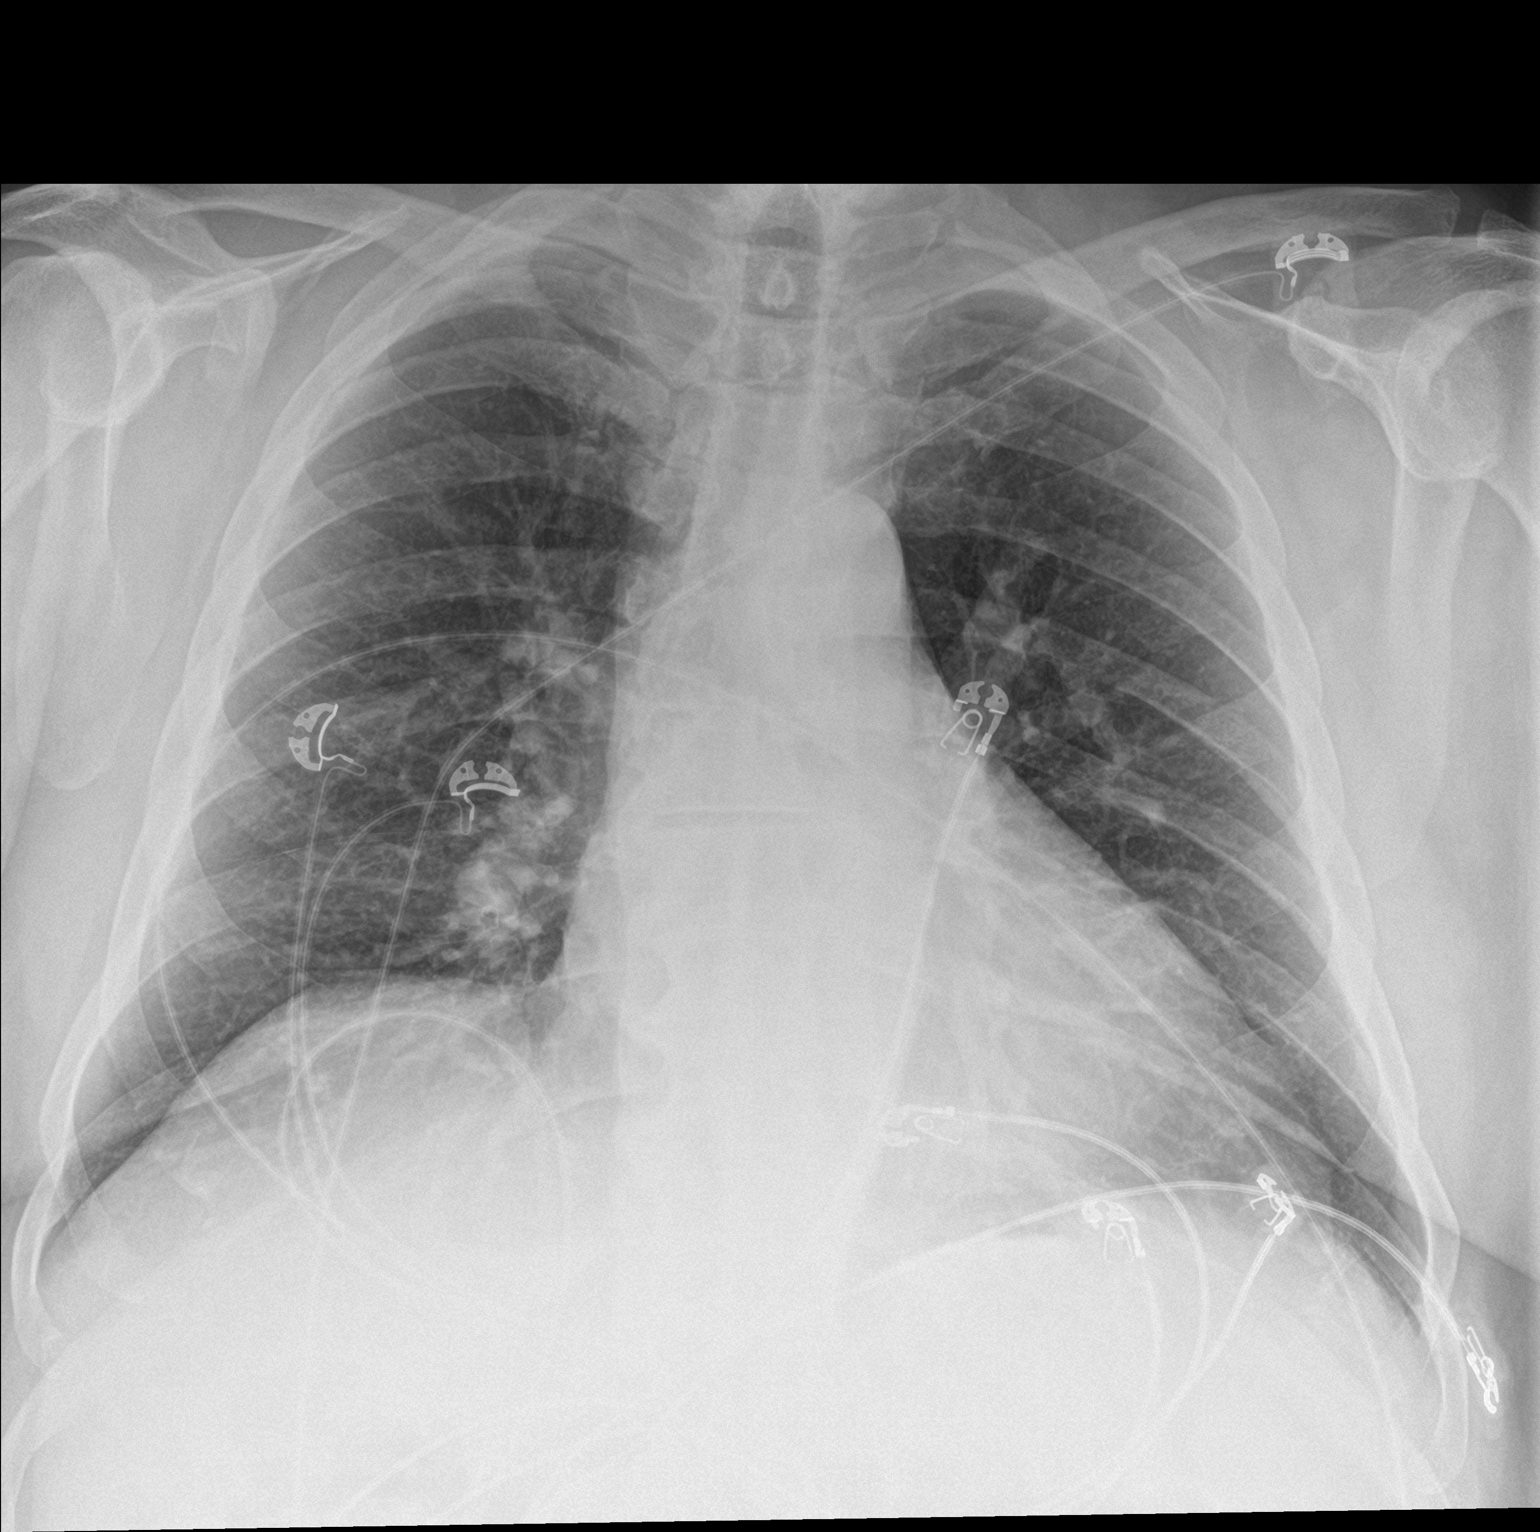

[2 of 2 positions shown; findings below may reference images not displayed]

FINDINGS: No acute opacity or pleural effusion. Mild cardiomegaly.
Degenerative osteophytes of the spine.
IMPRESSION: No active cardiopulmonary disease.  Mild cardiomegaly

## 2018-12-22 MED ORDER — ALUM & MAG HYDROXIDE-SIMETH 200-200-20 MG/5ML PO SUSP: 30.0000 mL | Freq: Four times a day (QID) | ORAL | Status: DC | PRN

## 2018-12-22 MED ORDER — PREDNISONE 20 MG PO TABS
50.0000 mg | ORAL_TABLET | Freq: Every day | ORAL | Status: AC
  Administered 2018-12-22 – 2018-12-24 (×3): 50 mg via ORAL
  Filled 2018-12-22 (×3): qty 2

## 2018-12-22 MED ORDER — KETOROLAC TROMETHAMINE 10 MG PO TABS
10.0000 mg | ORAL_TABLET | Freq: Four times a day (QID) | ORAL | Status: AC | PRN
  Filled 2018-12-22: qty 1

## 2018-12-22 MED ORDER — PREDNISONE 10 MG PO TABS: 10.0000 mg | ORAL_TABLET | Freq: Every day | ORAL | Status: DC

## 2018-12-22 MED ORDER — METOPROLOL TARTRATE 25 MG PO TABS
25.0000 mg | ORAL_TABLET | Freq: Four times a day (QID) | ORAL | Status: DC
  Administered 2018-12-22 – 2018-12-27 (×20): 25 mg via ORAL
  Filled 2018-12-22 (×22): qty 1

## 2018-12-22 MED ORDER — METOPROLOL TARTRATE 25 MG PO TABS: 25.0000 mg | ORAL_TABLET | Freq: Three times a day (TID) | ORAL | Status: DC

## 2018-12-22 MED ORDER — LORAZEPAM 0.5 MG PO TABS
0.5000 mg | ORAL_TABLET | Freq: Three times a day (TID) | ORAL | Status: DC | PRN
  Administered 2018-12-22 – 2018-12-24 (×6): 0.5 mg via ORAL
  Filled 2018-12-22 (×6): qty 1

## 2018-12-22 MED ORDER — PREDNISONE 20 MG PO TABS: 20.0000 mg | ORAL_TABLET | Freq: Every day | ORAL | Status: DC

## 2018-12-22 MED ORDER — PREDNISONE 20 MG PO TABS
40.0000 mg | ORAL_TABLET | Freq: Every day | ORAL | Status: DC
  Administered 2018-12-26 – 2018-12-27 (×2): 40 mg via ORAL
  Filled 2018-12-22 (×2): qty 2

## 2018-12-22 MED ORDER — PREDNISONE 20 MG PO TABS
30.0000 mg | ORAL_TABLET | Freq: Every day | ORAL | Status: DC
  Filled 2018-12-22: qty 1

## 2018-12-22 NOTE — Telephone Encounter (Signed)
Per MR: [facility] have one on hold they sent out a packet for him to sign release form and they need 25>00 check 25:00

## 2018-12-22 NOTE — Progress Notes (Signed)
STROKE TEAM PROGRESS NOTE   INTERVAL HISTORY Patient in bed NAD. No neurological complaints. 2-D echo showed increased pericardial effusion and patient has been seen by cardiothoracic surgery and plan is to hold eliquis for a few days prior to surgery.  Vitals:   12/22/18 0739 12/22/18 1049 12/22/18 1131 12/22/18 1355  BP:  (!) 124/96 99/72 113/79  Pulse:  (!) 129 (!) 110 92  Resp:   14   Temp:   98.4 F (36.9 C)   TempSrc:   Oral   SpO2:   100%   Weight: 118.1 kg     Height:        CBC:  Recent Labs  Lab 12/20/18 1000  WBC 5.7  NEUTROABS 3.5  HGB 13.0  HCT 38.4*  MCV 102.4*  PLT 220    Basic Metabolic Panel:  Recent Labs  Lab 12/20/18 1000  NA 137  K 3.7  CL 104  CO2 22  GLUCOSE 115*  BUN 13  CREATININE 1.08  CALCIUM 8.9   Lipid Panel:     Component Value Date/Time   CHOL 79 12/21/2018 0528   TRIG 54 12/21/2018 0528   HDL 26 (L) 12/21/2018 0528   CHOLHDL 3.0 12/21/2018 0528   VLDL 11 12/21/2018 0528   LDLCALC 42 12/21/2018 0528   HgbA1c:  Lab Results  Component Value Date   HGBA1C 5.5 12/21/2018   Urine Drug Screen:     Component Value Date/Time   LABOPIA NONE DETECTED 12/20/2018 1025   COCAINSCRNUR NONE DETECTED 12/20/2018 1025   LABBENZ NONE DETECTED 12/20/2018 1025   AMPHETMU NONE DETECTED 12/20/2018 1025   THCU NONE DETECTED 12/20/2018 1025   LABBARB NONE DETECTED 12/20/2018 1025    Alcohol Level No results found for: [facility]  IMAGING Mr [name] Head Wo Contrast  Result Date: 12/20/2018 CLINICAL DATA:  68 y/o M; altered mental status and atrial fibrillation on arrival. EXAM: MRI HEAD WITHOUT CONTRAST MRA HEAD WITHOUT CONTRAST TECHNIQUE: Multiplanar, multiecho pulse sequences of the brain and surrounding structures were obtained without intravenous contrast. Angiographic images of the head were obtained using MRA technique without contrast. COMPARISON:  12/20/2018 CT head FINDINGS: MRI HEAD FINDINGS Brain: No acute infarction, hemorrhage,  hydrocephalus, extra-axial collection or mass effect. 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 Few punctate nonspecific T2 FLAIR hyperintensities in subcortical and periventricular white matter are compatible with minimal chronic microvascular ischemic changes. 2-3 mm subependymal nodules (4 identified, series 7, image 18-19) of the right lateral ventricle. Vascular: Normal flow voids. Skull and upper cervical spine: Normal marrow signal. Sinuses/Orbits: Negative. Other: None. MRA HEAD FINDINGS Anterior circulation: No large vessel occlusion, aneurysm, or significant stenosis is identified. Posterior circulation: No large vessel occlusion, aneurysm, or significant stenosis is identified. Anatomic variation: None significant. IMPRESSION: 1. No acute intracranial abnormality. 2. Minimal chronic microvascular ischemic changes of white matter. 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 3. 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 4. Normal MRA of the head. Electronically Signed   By: Kristine Garbe M.D.   On: 12/20/2018 18:22   Mr Brain Wo Contrast  Result Date: 12/20/2018 CLINICAL DATA:  68 y/o M; altered mental status and atrial fibrillation on arrival. EXAM: MRI HEAD WITHOUT CONTRAST MRA HEAD WITHOUT CONTRAST TECHNIQUE: Multiplanar, multiecho pulse sequences of the brain and surrounding structures were obtained without intravenous contrast. Angiographic images of the head were obtained using MRA technique without  contrast. COMPARISON:  12/20/2018 CT head FINDINGS: MRI HEAD FINDINGS Brain: No acute infarction, hemorrhage, hydrocephalus, extra-axial collection or mass effect. 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 Few punctate nonspecific T2 FLAIR hyperintensities in subcortical and periventricular white matter are compatible  with minimal chronic microvascular ischemic changes. 2-3 mm subependymal nodules (4 identified, series 7, image 18-19) of the right lateral ventricle. Vascular: Normal flow voids. Skull and upper cervical spine: Normal marrow signal. Sinuses/Orbits: Negative. Other: None. MRA HEAD FINDINGS Anterior circulation: No large vessel occlusion, aneurysm, or significant stenosis is identified. Posterior circulation: No large vessel occlusion, aneurysm, or significant stenosis is identified. Anatomic variation: None significant. IMPRESSION: 1. No acute intracranial abnormality. 2. Minimal chronic microvascular ischemic changes of white matter. 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 3. 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 4. Normal MRA of the head. Electronically Signed   By: Kristine Garbe M.D.   On: 12/20/2018 18:22   Vas US Carotid (at [place] Only)  Result Date: 12/22/2018 Carotid Arterial Duplex Study Indications:       TIA and Altered mental status, shaking. Risk Factors:      Hypertension, coronary artery disease. Other Factors:     Atrial fibrillation, OSA. Limitations:       Body habitus, depth of vessels, breathing interference Comparison Study:  Prior study from 12/15/10 is available for comparison Performing Technologist: Sharion Dove RVS  Examination Guidelines: A complete evaluation includes B-mode imaging, spectral Doppler, color Doppler, and power Doppler as needed of all accessible portions of each vessel. Bilateral testing is considered an integral part of a complete examination. Limited examinations for reoccurring indications may be performed as noted.  Right Carotid Findings: +----------+--------+--------+--------+--------+--------+           PSV cm/sEDV cm/sStenosisDescribeComments +----------+--------+--------+--------+--------+--------+ CCA  Prox  137     29                               +----------+--------+--------+--------+--------+--------+ CCA Distal83      22                               +----------+--------+--------+--------+--------+--------+ ICA Prox  70      13                               +----------+--------+--------+--------+--------+--------+ ICA Distal32      8                                +----------+--------+--------+--------+--------+--------+ ECA       160     20                               +----------+--------+--------+--------+--------+--------+ +----------+--------+-------+--------+-------------------+           PSV cm/sEDV cmsDescribeArm Pressure (mmHG) +----------+--------+-------+--------+-------------------+ [id number]                                         +----------+--------+-------+--------+-------------------+  Left Carotid Findings: +----------+--------+--------+--------+--------+--------+  PSV cm/sEDV cm/sStenosisDescribeComments +----------+--------+--------+--------+--------+--------+ CCA Prox  68      18                               +----------+--------+--------+--------+--------+--------+ CCA Distal46      8                                +----------+--------+--------+--------+--------+--------+ ICA Prox  39      13                               +----------+--------+--------+--------+--------+--------+ ICA Distal43      19                               +----------+--------+--------+--------+--------+--------+ +----------+--------+--------+--------+-------------------+ SubclavianPSV cm/sEDV cm/sDescribeArm Pressure (mmHG) +----------+--------+--------+--------+-------------------+           78                                          +----------+--------+--------+--------+-------------------+  Summary: Right Carotid: The extracranial vessels were near-normal with only minimal wall                thickening  or plaque. Left Carotid: The extracranial vessels were near-normal with only minimal wall               thickening or plaque. Vertebrals:  Bilateral vertebral arteries were not visualized. Subclavians: Normal flow hemodynamics were seen in bilateral subclavian              arteries. *See table(s) above for measurements and observations.  Electronically signed by Antony Contras MD on 12/22/2018 at 12:54:12 PM.    Final     PHYSICAL EXAM  Obese middle-aged Caucasian male not in distress. . Afebrile. Head is nontraumatic. Neck is supple without bruit.    Cardiac exam no murmur or gallop. Lungs are clear to auscultation. Distal pulses are well felt. Neurological Exam ;  Awake  Alert oriented x 3. Normal speech and language.eye movements full without nystagmus.fundi were not visualized. Vision acuity and fields appear normal. Hearing is normal. Palatal movements are normal. Face symmetric. Tongue midline. Normal strength, tone, reflexes and coordination. Normal sensation. Gait deferred.   ASSESSMENT/PLAN Devon Mathews. is a 68 y.o. male with history of  SOB, RLS, OSA, HTN, Afib ( on eliquis) presenting with possible seizure. His right arm had tremulous activity for about 10 seconds on 12/20/2018. MRI- negative for stroke. MRA: normal EEG: normal  Transient episode of speech difficulty questionable TIA:   secondary to a. 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  MRA   Normal   Carotid Doppler bilateral minimum wall thickening without significant stenosis2D Echo  Normal ejection fraction but increased pericardial effusion in size with mild right ventricular diastolic collapse compared with previous echo from February 2020  LDL 42  HgbA1c 5.5  Eliquis for VTE prophylaxis Diet Order            Diet Heart Room service appropriate? Yes; Fluid consistency: Thin  Diet effective now  Eliquis  (apixaban) daily prior to admission, now on Eliquis (apixaban) daily.   Therapy recommendations:  Pending   Disposition:  pending  Hypertension  Stable . Permissive hypertension (OK if < 220/120) but gradually normalize in 5-7 days . Long-term BP goal normotensive  Hyperlipidemia  Home meds:  atorvastatin 80 mg , resumed in hospital  LDL 42, goal < 70  Continue statin at discharge  Diabetes type II  HgbA1c 5.5, goal < 7.0  Controlled  Other Stroke Risk Factors  Advanced age  Coronary artery disease  Obstructive sleep apnea, CPAP at night  Atrial fibrillation    Other Active Problems  hemorrhagic pericarditis  Hospital day # 2  He presented with transient episode of speech difficulties with verbal perseveration and difficulty expressing himself but lasting only 10-15 seconds. He denied any loss of consciousness, headache or any other focal symptoms. Is unclear whether this represents a TIA not but brain imaging and neurovascular workup is unremarkable.The patient indicates understanding of these issues and agrees with the plan. With holding eliquis to allow cardiac surgery to drain pericardial effusion. Resume eliquis when safe from surgical standpoint.Marland Kitchenaggressive risk factor modification and use CPAP every night for his sleep apnea.  Discussed with patient  Stroke team will sign off. Kindly call for questions. Antony Contras, MD Medical Director [place] Pager: [phone] 12/22/2018 3:17 PM   To contact Stroke Continuity provider, please refer to [email or web address] After hours, contact General Neurology

## 2018-12-22 NOTE — Progress Notes (Signed)
PROGRESS NOTE   Devon Mathews.  KGM:[id number]    DOB: 09/09/1951    DOA: 12/20/2018  PCP: Haywood Pao, MD   I have briefly reviewed patients previous medical records in [facility].  Brief Narrative:  68 year old married male with PMH of CAD status post DES to LAD 08/2018, HTN, HLD, obesity, OSA on CPAP, RLS, recent hospitalization 12/05/2018-12/08/2018 when he presented for cath due to an abnormal stress test after seen in clinic for chest pain, complicated hospital course including PCI complicated by diagonal microperforation, hemorrhagic pericarditis/pericardial effusion, early tamponade, borderline cardiogenic shock requiring pressors, Acute kidney injury, seen by outpatient cardiology on 2/28 but on weekend PTA developed dyspnea, orthopnea, unable to get an appointment with his cardiologist and hence seen by PCP where reportedly diagnosed with new onset A. fib and CHF, HCTZ changed to [place] discontinued, Brilinta changed to Plavix and Eliquis started with improvement in symptoms, now presented to ED with transient confusion, speech difficulty and shaking of right hand.  Admitted for possible TIA, new onset A. fib with RVR and hemorrhagic pericarditis.  Cardiology and neurology consulted.  On 3/5, cardiology advised that patient has large pericardial effusion with early tamponade features, recommended transferring him to cardiac progressive care unit for close monitoring and have consulted CVTS for possible pericardial window.  Eliquis discontinued, last dose was 3/5 at 8:55 AM. CVTS plans pericardial window on 12/25/2018.   Assessment & Plan:   Principal Problem:   TIA (transient ischemic attack) Active Problems:   CAD S/P percutaneous coronary angioplasty   Essential hypertension   Lower extremity edema   Shortness of breath   OSA (obstructive sleep apnea)   History of non-ST elevation myocardial infarction (NSTEMI)   Hyperlipidemia LDL goal <70   Atrial fibrillation with  RVR (HCC)   Pericardial effusion without cardiac tamponade   1. Transient episode of speech difficulties with verbal perseveration: Neurology consulted and assisted with evaluation and management.  Initially admitted as a possible TIA.  Stroke MD follow-up appreciated who opines that it is unclear whether this represents a TIA, brain imaging and neurovascular work-up is unremarkable.  Patient was transitioned to Plavix and Eliquis on admission.  However as per cardiology follow-up, pericardial effusion has enlarged, needs intervention, hence Eliquis discontinued.  CT head: Normal.  MRI brain: No acute intracranial abnormality.  MRA head: Normal.TTE: LVEF > 65%.  Moderate circumferential pericardial effusion.  Carotid Dopplers: Near normal.  LDL 42.  A1c 5.5.  Stable without recurrent symptoms. 2. Pericardial effusion with early tamponade by echo/hemorrhagic pericarditis: TTE shows moderate circumferential pericardial effusion and when compared to 2/20, this pericardial effusion has increased in size and concern for early tamponade.  Cardiology input appreciated.  I discussed with Dr. [name] 3/5 who recommended stopping Lasix, reducing Toprol-XL from 75 to 25 mg daily to avoid hypotension, transfer to cardiac progressive care unit.  As per note, patient had difficulty with attempted pericardiocentesis at the time of initial guidewire perforation, moreover patient on Plavix and Eliquis, thereby feel pericardial window is likely a safer option.  Cardiology have consulted CVTS for possible pericardial window.  He may require amiodarone for rate control.  Continue colchicine.  CVTS input appreciated, plan for pericardial window on 3/9 unless he worsens prior to that when need to be done urgently.  Intermittent chest pain, probably related to pericarditis, cardiology considering NSAIDs versus steroids. 3. New onset A. fib with RVR: Currently on Toprol-XL 25 mg daily, rate mostly controlled in the 100-110'.  Occasional RVR in the 140s.  Eliquis discontinued after 3/5 dose.  On Plavix alone.  Defer antiplatelet therapy and anticoagulation decisions to cardiology., 4. Essential hypertension: Controlled. 5. Hyperlipidemia: LDL 42, goal <7.  Continue atorvastatin. 6. OSA on CPAP: Unable to tolerate hospital CPAP last night and used nasal cannula oxygen.  May consider bringing home CPAP. 7. Obesity/BMI 39.28 8. GERD: Resume home dose of PPI.  This may also be contributing to his chest pain. 9. Anxiety: Patient anxious regarding his complex medical condition and upcoming cardiac procedure and requests some medication.  PRN Ativan started.  DVT prophylaxis: SCDs.  Eliquis discontinued-Last dose was on 3/5 at 8:55 AM. Code Status: Full Family Communication: None at bedside Disposition: Transfer to cardiac progressive unit on 12/21/2018.  Since his primary medical issues are currently cardiac, I requested the cardiology service to take over care onto their service.  TRH will then sign off.   Consultants:  Cardiology Neurology CVTS Procedures:  None  Antimicrobials:  None   Subjective: Transient anterior chest pain, worse with deep breathing.  Also reports being on PPI at home.  Anxious.  Currently chest pain resolved.  No dyspnea or palpitations.  ROS: As above, otherwise negative  Objective:  Vitals:   12/21/18 2350 12/22/18 0450 12/22/18 0730 12/22/18 0739  BP: 113/82 126/77 136/83   Pulse: 99 97 (!) 139   Resp:  14    Temp: 98.3 F (36.8 C) (!) 97.4 F (36.3 C) 97.6 F (36.4 C)   TempSrc: Oral Oral Oral   SpO2: 97% 95% 96%   Weight:    118.1 kg  Height:        Examination:  General exam: Pleasant middle-age male, moderately built and obese sitting up comfortably in bed without distress. Respiratory system: Clear to auscultation. Respiratory effort normal.  Stable Cardiovascular system: S1 & S2 heard, irregularly irregular and mildly tachycardic. No JVD, murmurs, rubs, gallops  or clicks.  Trace bilateral ankle edema.  Telemetry personally reviewed: A. fib with RVR with ventricular rate ranging 100-110's Gastrointestinal system: Abdomen is nondistended, soft and nontender. No organomegaly or masses felt. Normal bowel sounds heard.  Stable Central nervous system: Alert and oriented. No focal neurological deficits. Extremities: Symmetric 5 x 5 power. Skin: No rashes, lesions or ulcers Psychiatry: Judgement and insight appear normal. Mood & affect anxious and tearful.     Data Reviewed: I have personally reviewed following labs and imaging studies  CBC: Recent Labs  Lab 12/20/18 1000  WBC 5.7  NEUTROABS 3.5  HGB 13.0  HCT 38.4*  MCV 102.4*  PLT 440   Basic Metabolic Panel: Recent Labs  Lab 12/20/18 1000  NA 137  K 3.7  CL 104  CO2 22  GLUCOSE 115*  BUN 13  CREATININE 1.08  CALCIUM 8.9   Liver Function Tests: Recent Labs  Lab 12/20/18 1000  AST 29  ALT 33  ALKPHOS 85  BILITOT 1.1  PROT 6.6  ALBUMIN 3.2*   Coagulation Profile: No results for input(s): INR, PROTIME in the last 168 hours. Cardiac Enzymes: Recent Labs  Lab 12/20/18 1000  TROPONINI 0.03*   HbA1C: Recent Labs    12/21/18 0528  HGBA1C 5.5   CBG: Recent Labs  Lab 12/20/18 1524  GLUCAP 111*    No results found for this or any previous visit (from the past 240 hour(s)).       Radiology Studies: Dg Chest 2 View  Result Date: 12/20/2018 CLINICAL DATA:  Cough, dyspnea and new onset  of atrial fibrillation. EXAM: CHEST - 2 VIEW COMPARISON:  Chest x-ray dated 09/12/2018 FINDINGS: There are new small bilateral pleural effusions. There is slight prominence of the pulmonary vascularity on the lateral view. Heart size is within normal limits considering the AP technique. No acute bone abnormality. IMPRESSION: New small bilateral pleural effusions. Prominent pulmonary vascularity demonstrated on the lateral view. Electronically Signed   By: Lorriane Shire M.D.   On:  12/20/2018 11:25   Ct Head Wo Contrast  Result Date: 12/20/2018 CLINICAL DATA:  68 year old male with altered mental status, in atrial fibrillation on arrival. EXAM: CT HEAD WITHOUT CONTRAST TECHNIQUE: Contiguous axial images were obtained from the base of the skull through the vertex without intravenous contrast. COMPARISON:  Orbit radiographs 11/07/2014. FINDINGS: Brain: No midline shift, ventriculomegaly, mass effect, evidence of mass lesion, intracranial hemorrhage or evidence of cortically based acute infarction. Gray-white matter differentiation is within normal limits throughout the brain. Cerebral volume is within normal limits for age. Vascular: Mild Calcified atherosclerosis at the skull base. No suspicious intracranial vascular hyperdensity. Skull: Negative. Sinuses/Orbits: Paranasal Visualized paranasal sinuses and mastoids are well pneumatized. Other: No acute orbit or scalp soft tissue findings. IMPRESSION: Normal for age non contrast CT appearance of the brain. Electronically Signed   By: Genevie Ann M.D.   On: 12/20/2018 12:07   Mr [name] Head Wo Contrast  Result Date: 12/20/2018 CLINICAL DATA:  68 y/o M; altered mental status and atrial fibrillation on arrival. EXAM: MRI HEAD WITHOUT CONTRAST MRA HEAD WITHOUT CONTRAST TECHNIQUE: Multiplanar, multiecho pulse sequences of the brain and surrounding structures were obtained without intravenous contrast. Angiographic images of the head were obtained using MRA technique without contrast. COMPARISON:  12/20/2018 CT head FINDINGS: MRI HEAD FINDINGS Brain: No acute infarction, hemorrhage, hydrocephalus, extra-axial collection or mass effect. 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 Few punctate nonspecific T2 FLAIR hyperintensities in subcortical and periventricular white matter are compatible with minimal chronic microvascular ischemic changes. 2-3 mm subependymal nodules (4 identified, series 7, image 18-19) of the right lateral  ventricle. Vascular: Normal flow voids. Skull and upper cervical spine: Normal marrow signal. Sinuses/Orbits: Negative. Other: None. MRA HEAD FINDINGS Anterior circulation: No large vessel occlusion, aneurysm, or significant stenosis is identified. Posterior circulation: No large vessel occlusion, aneurysm, or significant stenosis is identified. Anatomic variation: None significant. IMPRESSION: 1. No acute intracranial abnormality. 2. Minimal chronic microvascular ischemic changes of white matter. 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 3. 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 4. Normal MRA of the head. Electronically Signed   By: Kristine Garbe M.D.   On: 12/20/2018 18:22   Mr Brain Wo Contrast  Result Date: 12/20/2018 CLINICAL DATA:  68 y/o M; altered mental status and atrial fibrillation on arrival. EXAM: MRI HEAD WITHOUT CONTRAST MRA HEAD WITHOUT CONTRAST TECHNIQUE: Multiplanar, multiecho pulse sequences of the brain and surrounding structures were obtained without intravenous contrast. Angiographic images of the head were obtained using MRA technique without contrast. COMPARISON:  12/20/2018 CT head FINDINGS: MRI HEAD FINDINGS Brain: No acute infarction, hemorrhage, hydrocephalus, extra-axial collection or mass effect. 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 Few punctate nonspecific T2 FLAIR hyperintensities in subcortical and periventricular white matter are compatible with minimal chronic microvascular ischemic changes. 2-3 mm subependymal nodules (4 identified, series 7, image 18-19) of the right lateral ventricle. Vascular: Normal flow voids. Skull and upper cervical spine: Normal  marrow signal. Sinuses/Orbits: Negative. Other: None. MRA HEAD FINDINGS Anterior circulation: No large vessel occlusion, aneurysm, or significant stenosis is  identified. Posterior circulation: No large vessel occlusion, aneurysm, or significant stenosis is identified. Anatomic variation: None significant. IMPRESSION: 1. No acute intracranial abnormality. 2. Minimal chronic microvascular ischemic changes of white matter. 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 3. 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 4. Normal MRA of the head. Electronically Signed   By: Kristine Garbe M.D.   On: 12/20/2018 18:22   Vas US Carotid (at [place] Only)  Result Date: 12/21/2018 Carotid Arterial Duplex Study Indications:       TIA and Altered mental status, shaking. Risk Factors:      Hypertension, coronary artery disease. Other Factors:     Atrial fibrillation, OSA. Limitations:       Body habitus, depth of vessels, breathing interference Comparison Study:  Prior study from 12/15/10 is available for comparison Performing Technologist: Sharion Dove RVS  Examination Guidelines: A complete evaluation includes B-mode imaging, spectral Doppler, color Doppler, and power Doppler as needed of all accessible portions of each vessel. Bilateral testing is considered an integral part of a complete examination. Limited examinations for reoccurring indications may be performed as noted.  Right Carotid Findings: +----------+--------+--------+--------+--------+--------+           PSV cm/sEDV cm/sStenosisDescribeComments +----------+--------+--------+--------+--------+--------+ CCA Prox  137     29                               +----------+--------+--------+--------+--------+--------+ CCA Distal83      22                               +----------+--------+--------+--------+--------+--------+ ICA Prox  70      13                               +----------+--------+--------+--------+--------+--------+ ICA Distal32      8                                 +----------+--------+--------+--------+--------+--------+ ECA       160     20                               +----------+--------+--------+--------+--------+--------+ +----------+--------+-------+--------+-------------------+           PSV cm/sEDV cmsDescribeArm Pressure (mmHG) +----------+--------+-------+--------+-------------------+ [id number]                                         +----------+--------+-------+--------+-------------------+  Left Carotid Findings: +----------+--------+--------+--------+--------+--------+           PSV cm/sEDV cm/sStenosisDescribeComments +----------+--------+--------+--------+--------+--------+ CCA Prox  68      18                               +----------+--------+--------+--------+--------+--------+ CCA Distal46      8                                +----------+--------+--------+--------+--------+--------+  ICA Prox  39      13                               +----------+--------+--------+--------+--------+--------+ ICA Distal43      19                               +----------+--------+--------+--------+--------+--------+ +----------+--------+--------+--------+-------------------+ SubclavianPSV cm/sEDV cm/sDescribeArm Pressure (mmHG) +----------+--------+--------+--------+-------------------+           78                                          +----------+--------+--------+--------+-------------------+  Summary: Right Carotid: The extracranial vessels were near-normal with only minimal wall                thickening or plaque. Left Carotid: The extracranial vessels were near-normal with only minimal wall               thickening or plaque. Vertebrals:  Bilateral vertebral arteries were not visualized. Subclavians: Normal flow hemodynamics were seen in bilateral subclavian              arteries. *See table(s) above for measurements and observations.     Preliminary         Scheduled  Meds: . atorvastatin  80 mg Oral q1800  . clopidogrel  75 mg Oral Daily  . colchicine  0.6 mg Oral BID  . metoprolol tartrate  25 mg Oral TID  . pantoprazole  40 mg Oral QHS  . potassium chloride  10 mEq Oral Daily   Continuous Infusions:   LOS: 2 days     Vernell Leep, MD, FACP, [facility]. [facility]  To contact the attending provider between 7A-7P or the covering provider during after hours 7P-7A, please log into the web site [email or web address] and access using universal [place] password for that web site. If you do not have the password, please call the hospital operator.  12/22/2018, 9:15 AM

## 2018-12-22 NOTE — Progress Notes (Signed)
Procedure(s) (LRB): SUBXYPHOID PERICARDIAL WINDOW (N/A) TRANSESOPHAGEAL ECHOCARDIOGRAM (TEE) (N/A) Subjective: Had some mild central chest discomfort this am that he thinks was indigestion.  Objective: Vital signs in last 24 hours: Temp:  [97.4 F (36.3 C)-98.9 F (37.2 C)] 98.4 F (36.9 C) (03/06 1131) Pulse Rate:  [92-139] 92 (03/06 1355) Cardiac Rhythm: Atrial fibrillation (03/06 0900) Resp:  [14-18] 14 (03/06 1131) BP: (99-161)/(65-96) 113/79 (03/06 1355) SpO2:  [93 %-100 %] 100 % (03/06 1131) Weight:  [118.1 kg-118.2 kg] 118.1 kg (03/06 0739)  Hemodynamic parameters for last 24 hours:    Intake/Output from previous day: 03/05 0701 - 03/06 0700 In: 1000 [P.O.:1000] Out: 300 [Urine:300] Intake/Output this shift: Total I/O In: 360 [P.O.:360] Out: -   General appearance: alert and cooperative Heart: irregularly irregular rhythm Lungs: clear to auscultation bilaterally  Lab Results: Recent Labs    12/20/18 1000  WBC 5.7  HGB 13.0  HCT 38.4*  PLT 265   BMET:  Recent Labs    12/20/18 1000  NA 137  K 3.7  CL 104  CO2 22  GLUCOSE 115*  BUN 13  CREATININE 1.08  CALCIUM 8.9    PT/INR: No results for input(s): LABPROT, INR in the last 72 hours. ABG No results found for: PHART, HCO3, TCO2, ACIDBASEDEF, O2SAT CBG (last 3)  Recent Labs    12/20/18 1524  GLUCAP 111*    Assessment/Plan:  Moderate to large pericardial effusion with pre-tamponade findings on echo. He remains hemodynamically stable. Plan subxyphoid pericardial window Monday am after Eliquis washout. I discussed status and plans with patient and wife and answered their questions.  LOS: 2 days    [name] 12/22/2018

## 2018-12-22 NOTE — Progress Notes (Signed)
SLP Cancellation Note  Patient Details Name: Devon Mathews. MRN: [id number] DOB: Oct 28, 1950   Cancelled treatment:       Reason Eval/Treat Not Completed: SLP screened, no needs identified as pt/wife say that his symptoms have resolved and MRI was negative for acute infarct. BE FAST was reviewed with pt and wife. SLP will sign off.   Venita Sheffield Sadi Arave 12/22/2018, 4:14 PM  Pollyann Glen, M.A. [place] [facility] education officer [phone] Office [phone]

## 2018-12-22 NOTE — Progress Notes (Addendum)
Progress Note  Patient Name: Devon Mathews. Date of Encounter: 12/22/2018  Primary Cardiologist: Skeet Latch, MD   Subjective   He has pleuritic chest pain this morning. No dyspnea. Laying comfortably.   Inpatient Medications    Scheduled Meds: . atorvastatin  80 mg Oral q1800  . clopidogrel  75 mg Oral Daily  . colchicine  0.6 mg Oral BID  . metoprolol tartrate  25 mg Oral QID  . pantoprazole  40 mg Oral QHS  . potassium chloride  10 mEq Oral Daily  . predniSONE  50 mg Oral Q breakfast   Followed by  . [START ON 12/25/2018] predniSONE  40 mg Oral Q breakfast   Followed by  . [START ON 12/28/2018] predniSONE  30 mg Oral Q breakfast   Followed by  . [START ON 12/31/2018] predniSONE  20 mg Oral Q breakfast   Followed by  . [START ON 01/03/2019] predniSONE  10 mg Oral Q breakfast   Continuous Infusions:  PRN Meds: acetaminophen, alum & mag hydroxide-simeth, amitriptyline, ketorolac, LORazepam, methocarbamol, nitroGLYCERIN, senna-docusate   Vital Signs    Vitals:   12/22/18 0739 12/22/18 1049 12/22/18 1131 12/22/18 1355  BP:  (!) 124/96 99/72 113/79  Pulse:  (!) 129 (!) 110 92  Resp:   14   Temp:   98.4 F (36.9 C)   TempSrc:   Oral   SpO2:   100%   Weight: 118.1 kg     Height:        Intake/Output Summary (Last 24 hours) at 12/22/2018 1615 Last data filed at 12/22/2018 1447 Gross per 24 hour  Intake 1020 ml  Output -  Net 1020 ml   Last 3 Weights 12/22/2018 12/21/2018 12/15/2018  Weight (lbs) 260 lb 4.8 oz 260 lb 9.6 oz 266 lb  Weight (kg) 118.071 kg 118.207 kg 120.657 kg      Telemetry    afib at 90-110s - Personally Reviewed  ECG    None today   Physical Exam   GEN: Obese male in no acute distress.   Neck: No JVD Cardiac:  Irregularly irregular rhythm with borderline tachycardia, no murmurs, rubs, or gallops.  Respiratory: Clear to auscultation bilaterally.  Nonlabored, good air movement GI: Soft, nontender, non-distended.  Obese MS: No  clubbing/cyanosis edema; No deformity. Neuro:  Nonfocal  Psych: Normal mood and affect   Labs    Chemistry Recent Labs  Lab 12/20/18 1000  NA 137  K 3.7  CL 104  CO2 22  GLUCOSE 115*  BUN 13  CREATININE 1.08  CALCIUM 8.9  PROT 6.6  ALBUMIN 3.2*  AST 29  ALT 33  ALKPHOS 85  BILITOT 1.1  GFRNONAA >60  GFRAA >60  ANIONGAP 11     Hematology Recent Labs  Lab 12/20/18 1000 12/21/18 0528  WBC 5.7  --   RBC 3.75*  --   HGB 13.0  --   HCT 38.4* 36.2*  MCV 102.4*  --   MCH 34.7*  --   MCHC 33.9  --   RDW 13.7  --   PLT 265  --     Cardiac Enzymes Recent Labs  Lab 12/20/18 1000  TROPONINI 0.03*   No results for input(s): TROPIPOC in the last 168 hours.   BNP Recent Labs  Lab 12/20/18 1000  BNP 209.7*     DDimer No results for input(s): DDIMER in the last 168 hours.   Radiology    Mr [facility] Contrast  Result Date: 12/20/2018 CLINICAL DATA:  68 y/o M; altered mental status and atrial fibrillation on arrival. EXAM: MRI HEAD WITHOUT CONTRAST MRA HEAD WITHOUT CONTRAST TECHNIQUE: Multiplanar, multiecho pulse sequences of the brain and surrounding structures were obtained without intravenous contrast. Angiographic images of the head were obtained using MRA technique without contrast. COMPARISON:  12/20/2018 CT head FINDINGS: MRI HEAD FINDINGS Brain: No acute infarction, hemorrhage, hydrocephalus, extra-axial collection or mass effect. 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 Few punctate nonspecific T2 FLAIR hyperintensities in subcortical and periventricular white matter are compatible with minimal chronic microvascular ischemic changes. 2-3 mm subependymal nodules (4 identified, series 7, image 18-19) of the right lateral ventricle. Vascular: Normal flow voids. Skull and upper cervical spine: Normal marrow signal. Sinuses/Orbits: Negative. Other: None. MRA HEAD FINDINGS Anterior circulation: No large vessel occlusion, aneurysm, or  significant stenosis is identified. Posterior circulation: No large vessel occlusion, aneurysm, or significant stenosis is identified. Anatomic variation: None significant. IMPRESSION: 1. No acute intracranial abnormality. 2. Minimal chronic microvascular ischemic changes of white matter. 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 3. 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 4. Normal MRA of the head. Electronically Signed   By: Kristine Garbe M.D.   On: 12/20/2018 18:22   Mr Brain Wo Contrast  Result Date: 12/20/2018 CLINICAL DATA:  68 y/o M; altered mental status and atrial fibrillation on arrival. EXAM: MRI HEAD WITHOUT CONTRAST MRA HEAD WITHOUT CONTRAST TECHNIQUE: Multiplanar, multiecho pulse sequences of the brain and surrounding structures were obtained without intravenous contrast. Angiographic images of the head were obtained using MRA technique without contrast. COMPARISON:  12/20/2018 CT head FINDINGS: MRI HEAD FINDINGS Brain: No acute infarction, hemorrhage, hydrocephalus, extra-axial collection or mass effect. 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 Few punctate nonspecific T2 FLAIR hyperintensities in subcortical and periventricular white matter are compatible with minimal chronic microvascular ischemic changes. 2-3 mm subependymal nodules (4 identified, series 7, image 18-19) of the right lateral ventricle. Vascular: Normal flow voids. Skull and upper cervical spine: Normal marrow signal. Sinuses/Orbits: Negative. Other: None. MRA HEAD FINDINGS Anterior circulation: No large vessel occlusion, aneurysm, or significant stenosis is identified. Posterior circulation: No large vessel occlusion, aneurysm, or significant stenosis is identified. Anatomic variation: None significant. IMPRESSION: 1. No acute intracranial abnormality. 2. Minimal  chronic microvascular ischemic changes of white matter. 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 3. 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 4. Normal MRA of the head. Electronically Signed   By: Kristine Garbe M.D.   On: 12/20/2018 18:22   Vas US Carotid (at [place] Only)  Result Date: 12/22/2018 Carotid Arterial Duplex Study Indications:       TIA and Altered mental status, shaking. Risk Factors:      Hypertension, coronary artery disease. Other Factors:     Atrial fibrillation, OSA. Limitations:       Body habitus, depth of vessels, breathing interference Comparison Study:  Prior study from 12/15/10 is available for comparison Performing Technologist: Sharion Dove RVS  Examination Guidelines: A complete evaluation includes B-mode imaging, spectral Doppler, color Doppler, and power Doppler as needed of all accessible portions of each vessel. Bilateral testing is considered an integral part of a complete examination. Limited examinations for reoccurring indications may be performed as noted.  Right Carotid Findings: +----------+--------+--------+--------+--------+--------+  PSV cm/sEDV cm/sStenosisDescribeComments +----------+--------+--------+--------+--------+--------+ CCA Prox  137     29                               +----------+--------+--------+--------+--------+--------+ CCA Distal83      22                               +----------+--------+--------+--------+--------+--------+ ICA Prox  70      13                               +----------+--------+--------+--------+--------+--------+ ICA Distal32      8                                +----------+--------+--------+--------+--------+--------+ ECA       160     20                               +----------+--------+--------+--------+--------+--------+  +----------+--------+-------+--------+-------------------+           PSV cm/sEDV cmsDescribeArm Pressure (mmHG) +----------+--------+-------+--------+-------------------+ [id number]                                         +----------+--------+-------+--------+-------------------+  Left Carotid Findings: +----------+--------+--------+--------+--------+--------+           PSV cm/sEDV cm/sStenosisDescribeComments +----------+--------+--------+--------+--------+--------+ CCA Prox  68      18                               +----------+--------+--------+--------+--------+--------+ CCA Distal46      8                                +----------+--------+--------+--------+--------+--------+ ICA Prox  39      13                               +----------+--------+--------+--------+--------+--------+ ICA Distal43      19                               +----------+--------+--------+--------+--------+--------+ +----------+--------+--------+--------+-------------------+ SubclavianPSV cm/sEDV cm/sDescribeArm Pressure (mmHG) +----------+--------+--------+--------+-------------------+           78                                          +----------+--------+--------+--------+-------------------+  Summary: Right Carotid: The extracranial vessels were near-normal with only minimal wall                thickening or plaque. Left Carotid: The extracranial vessels were near-normal with only minimal wall               thickening or plaque. Vertebrals:  Bilateral vertebral arteries were not visualized. Subclavians: Normal flow hemodynamics were seen in bilateral subclavian              arteries. *See table(s)  above for measurements and observations.  Electronically signed by Antony Contras MD on 12/22/2018 at 12:54:12 PM.    Final     Cardiac Studies   Echo 12/21/18 IMPRESSIONS   1. The left ventricle has hyperdynamic systolic function, with an ejection fraction of >65%. Left  ventricular diastology could not be evaluated due to nondiagnostic images.  2. The mitral valve is normal in structure.  3. Aortic valve regurgitation is mild by color flow Doppler.  4. Moderate pericardial effusion.  5. The pericardial effusion is circumferential.  6. There is inversion of the right ventricular wall, inversion of the right atrial wall and excessive respiratory variation in the mitral valve spectral Doppler velocities.  7. When compared to the prior study: Compared to study 11/2018, the pericardial effusion has incrased in size and now significant effusion present posterior as well as anteriorly. There is RA inversion and suggestive of subtle intermittent RV diastolic  collapse. There is some respiratory variation in the [facility] inlow pattern with respirations. The IVC is not well visualized. Concern for early tamponade.  Patient Profile     68 y.o. male with hx of CAD-PCI with recent hospitalization following PTCA complicated by guidewire perforation (on February 18) who presented with new onset A. fib with possible TIA and CHF. Hospital course complicated by enlarging pericardial effusion with some signs on the echocardiogram to suggest early tamponade physiology, however clinically he is not in tamponade. Seen by [facility] and plan for subxiphoid pericardial window Monday. Last dose of Eliquis AM of 12/21/18.  Assessment & Plan    Principal Problem:   TIA (transient ischemic attack) Active Problems:   CAD S/P percutaneous coronary angioplasty   Atrial fibrillation with RVR (HCC)   Pericardial effusion without cardiac tamponade   Essential hypertension   Lower extremity edema   Shortness of breath   OSA (obstructive sleep apnea)   History of non-ST elevation myocardial infarction (NSTEMI)   Hyperlipidemia LDL goal <70   New onset atrial fibrillation ([place])    1. Enlarging pericardia effusion  - Early sign of tamponade physiology by echo but hemodynamically stable. Plan for  pericardial window Monday as long remains stable. Last dose of Eliquis 3/5 AM.  - Per Dr. [name] "he could be placed on heparin while the Eliquis is washing out" will defer to Dr. [name] (see below reference heparin).   2. CAD s/p successful scoring balloon angioplasty ostial 1st diag 12/05/18 (and DES PCI in November 2019) - Changed from Brilinta 90 mg BID to Plavix -- dose 300 mg then & 75 mg daily - Continue Statin. Off ASA due to anticoagulation  3. Atrial fibrillation-new onset (persistent)/with RVR - Anticoagulation as above. HR in 90-100s. Seems goes little higher with movement. On Toprol XL 25mg  daily (reduce from 75 mg to avoid hypotension).  Change Toprol to metoprolol Tartrate 25 mg 4 times daily.   Concerning his possible uremia being related to admitting symptoms of possible TIA.  Would like to give a full 48 hours off of anticoagulation (Eliquis) to allow for any bleeding to heal.  Would then have low threshold to consider starting IV heparin tomorrow to run until midnight Sunday.  4. 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 He has intermittent pleuritic chest pain. Worsen with deep breath and bending down. Will given PRN Toradol.   We will start steroid taper for additional management.  Initial presenting symptom was concerning for possible TIA which somewhat problematic in a patient who has enlarging pericardial effusion that has  concern for possible hemorrhagic component.  This is now also in the setting of having A. fib RVR  Continue current statin dose.     Signed, Leanor Kail, PA  12/22/2018, 8:22 AM    ATTENDING ATTESTATION  I have seen, examined and evaluated the patient this AM along with Mr. Curly [place], [place].  After reviewing all the available data and chart, we discussed the patients laboratory, study & physical findings as well as symptoms in detail. I agree with his findings, examination as well as impression recommendations as per our discussion.     Attending adjustments noted in italics.   Devon Mathews continues to do remarkably well.  He does not really notice his A. fib, he is noticing a little bit of pleuritic discomfort today.  I think is not unreasonable to titrate up his pericarditis regimen since were no longer able to consider NSAIDs, would add steroids pending his pericardial window next Monday. A. fib rate has gone up, I initially backed off on his Toprol dose yesterday for fear of potential dropping his blood pressures with borderline to a tamponade physiology on echo.  Would feel more comfortable changing to 4 times daily low-dose oral beta-blocker to see if we can control his rate.  If not, we consider the possibility of amiodarone versus IV diltiazem.  Appreciate Dr. Vivi Martens rapid consultation yesterday.  He was admitted with the patient from the original cardiac catheterization.  I discussed it with him in the morning.  Definitely concerned about the length of tunneling to the pericardium and therefore would like to wait until Monday to be fully off of Eliquis.  If we did start heparin would want to make sure that it turns off at midnight Sunday night.     Glenetta Hew, M.D., M.S. Interventional Cardiologist   Pager # [phone] Phone # [phone] [address] [place],  [address]   For questions or updates, please contact [place] Please consult [email or web address] for contact info under

## 2018-12-22 NOTE — Telephone Encounter (Signed)
New Message   Patient's wife calling for patient's FMLA papers to be faxed and wants to know if it's been done yet.  Please give patient a call.

## 2018-12-22 NOTE — Telephone Encounter (Signed)
Spoke with patients spouse and advised to check with patients employer to see if out of work note was received and if not to contact office and I would refax letter. Spouse voiced understanding.

## 2018-12-22 NOTE — Evaluation (Signed)
Occupational Therapy Evaluation Patient Details Name: Devon Mathews. MRN: [id number] DOB: [birth year] Today's Date: 12/22/2018    History of Present Illness Patient is 68 y/o male admitted to hospital secondary to TIA likely from new onset afib. Per notes, patient had a recent complicated admission: PCI on 4/62/70 complicated by diagonal microperforation, hemorrhagic pericarditis, pericardial effusion, borderline cardiogenic shock, AKI. PMH includes CAD, HTN, OSA, NSTEMI, DM, and HLD.   Clinical Impression   Pt PTA: home with spouse, independent with previous history above regarding recent hospitalizations. Pt currently with no focal deficits- pt alert, oriented with clear speech. Pt performing all functional tasks and mobility with modified independence and no assist for ADL. Pt would require assist to pick item off of floor due to large body habitus. Pt has no needs for OT skilled services at this time. OT signing off.    Follow Up Recommendations  No OT follow up    Equipment Recommendations  None recommended by OT    Recommendations for Other Services       Precautions / Restrictions Precautions Precautions: None Restrictions Weight Bearing Restrictions: No      Mobility Bed Mobility               General bed mobility comments: Patient in chair upon arrival  Transfers Overall transfer level: Modified independent               General transfer comment: Patient at modI level to stand without AD. Denies dizziness    Balance Overall balance assessment: Modified Independent   Sitting balance-Leahy Scale: Good       Standing balance-Leahy Scale: Good                             ADL either performed or assessed with clinical judgement   ADL Overall ADL's : At baseline                                             Vision Baseline Vision/History: Wears glasses Vision Assessment?: No apparent visual deficits     Perception      Praxis      Pertinent Vitals/Pain Pain Assessment: No/denies pain     Hand Dominance     Extremity/Trunk Assessment Upper Extremity Assessment Upper Extremity Assessment: Overall WFL for tasks assessed   Lower Extremity Assessment Lower Extremity Assessment: Overall WFL for tasks assessed;Defer to PT evaluation   Cervical / Trunk Assessment Cervical / Trunk Assessment: Normal   Communication Communication Communication: No difficulties   Cognition Arousal/Alertness: Awake/alert Behavior During Therapy: WFL for tasks assessed/performed Overall Cognitive Status: Within Functional Limits for tasks assessed                                     General Comments       Exercises     Shoulder Instructions      Home Living Family/patient expects to be discharged to:: Private residence Living Arrangements: Spouse/significant other Available Help at Discharge: Family;Available PRN/intermittently Type of Home: House Home Access: Stairs to enter [employer] of Steps: 6 Entrance Stairs-Rails: Left Home Layout: One level     Bathroom Shower/Tub: Teacher, early years/pre: Standard     Home Equipment: None  Prior Functioning/Environment Level of Independence: Independent        Comments: still working        OT Problem List:        OT Treatment/Interventions:      OT Goals(Current goals can be found in the care plan section)    OT Frequency:     Barriers to D/C:            Co-evaluation              AM-PAC OT "6 Clicks" Daily Activity     Outcome Measure Help from another person eating meals?: None Help from another person taking care of personal grooming?: None Help from another person toileting, which includes using toliet, bedpan, or urinal?: None Help from another person bathing (including washing, rinsing, drying)?: None Help from another person to put on and taking off regular upper body  clothing?: None Help from another person to put on and taking off regular lower body clothing?: None 6 Click Score: 24   End of Session Nurse Communication: Mobility status  Activity Tolerance: Patient tolerated treatment well Patient left: in chair;with call bell/phone within reach  OT Visit Diagnosis: Unsteadiness on feet (R26.81);Muscle weakness (generalized) (M62.81)                Time: 1102-1117 OT Time Calculation (min): 14 min Charges:  OT General Charges $OT Visit: 1 Visit OT Evaluation $OT Eval Low Complexity: 1 Low  Darryl Nestle) Marsa Aris OTR/L Acute Rehabilitation Services Pager: [phone] Office: [phone]   Fredda Hammed 12/22/2018, 2:40 PM

## 2018-12-22 NOTE — Telephone Encounter (Signed)
Pt notified, he is very upset and then stated that he has it and will take care of it  and then hung up.

## 2018-12-23 DIAGNOSIS — Z9861 Coronary angioplasty status: Secondary | ICD-10-CM

## 2018-12-23 DIAGNOSIS — I252 Old myocardial infarction: Secondary | ICD-10-CM

## 2018-12-23 DIAGNOSIS — I313 Pericardial effusion (noninflammatory): Principal | ICD-10-CM

## 2018-12-23 DIAGNOSIS — I10 Essential (primary) hypertension: Secondary | ICD-10-CM

## 2018-12-23 DIAGNOSIS — G459 Transient cerebral ischemic attack, unspecified: Secondary | ICD-10-CM

## 2018-12-23 DIAGNOSIS — I2510 Atherosclerotic heart disease of native coronary artery without angina pectoris: Secondary | ICD-10-CM

## 2018-12-23 DIAGNOSIS — I4891 Unspecified atrial fibrillation: Secondary | ICD-10-CM

## 2018-12-23 LAB — BASIC METABOLIC PANEL
Anion gap: 11 (ref 5–15)
BUN: 15 mg/dL (ref 8–23)
CO2: 25 mmol/L (ref 22–32)
Calcium: 9.1 mg/dL (ref 8.9–10.3)
Chloride: 104 mmol/L (ref 98–111)
Creatinine, Ser: 0.98 mg/dL (ref 0.61–1.24)
GFR calc Af Amer: >60 mL/min (ref >60)
GFR calc non Af Amer: >60 mL/min (ref >60)
Glucose, Bld: 109 mg/dL — ABNORMAL HIGH (ref 70–99)
Potassium: 4.3 mmol/L (ref 3.5–5.1)
Sodium: 140 mmol/L (ref 135–145)

## 2018-12-23 LAB — CBC
HCT: 36 % — ABNORMAL LOW (ref 39.0–52.0)
Hemoglobin: 11.7 g/dL — ABNORMAL LOW (ref 13.0–17.0)
MCH: 33.5 pg (ref 26.0–34.0)
MCHC: 32.5 g/dL (ref 30.0–36.0)
MCV: 103.2 fL — ABNORMAL HIGH (ref 80.0–100.0)
Platelets: 241 10*3/uL (ref 150–400)
RBC: 3.49 MIL/uL — ABNORMAL LOW (ref 4.22–5.81)
RDW: 13.5 % (ref 11.5–15.5)
WBC: 5.4 10*3/uL (ref 4.0–10.5)
nRBC: 0 % (ref 0.0–0.2)

## 2018-12-23 NOTE — Progress Notes (Signed)
Progress Note  Patient Name: Devon Mathews. Date of Encounter: 12/23/2018  Primary Cardiologist: Skeet Latch, MD   Subjective   Chest feels better today - may be d/t steroids.  Inpatient Medications    Scheduled Meds: . atorvastatin  80 mg Oral q1800  . clopidogrel  75 mg Oral Daily  . colchicine  0.6 mg Oral BID  . metoprolol tartrate  25 mg Oral QID  . pantoprazole  40 mg Oral QHS  . potassium chloride  10 mEq Oral Daily  . predniSONE  50 mg Oral Q breakfast   Followed by  . [START ON 12/25/2018] predniSONE  40 mg Oral Q breakfast   Followed by  . [START ON 12/28/2018] predniSONE  30 mg Oral Q breakfast   Followed by  . [START ON 12/31/2018] predniSONE  20 mg Oral Q breakfast   Followed by  . [START ON 01/03/2019] predniSONE  10 mg Oral Q breakfast   Continuous Infusions:  PRN Meds: acetaminophen, alum & mag hydroxide-simeth, amitriptyline, ketorolac, LORazepam, methocarbamol, nitroGLYCERIN, senna-docusate   Vital Signs    Vitals:   12/23/18 0410 12/23/18 0518 12/23/18 0820 12/23/18 1114  BP: 110/76  127/73 117/73  Pulse:   99   Resp:      Temp:  97.8 F (36.6 C) 98.4 F (36.9 C)   TempSrc:  Oral Oral   SpO2:   97%   Weight:  117.8 kg    Height:        Intake/Output Summary (Last 24 hours) at 12/23/2018 1142 Last data filed at 12/22/2018 1447 Gross per 24 hour  Intake 360 ml  Output -  Net 360 ml   Last 3 Weights 12/23/2018 12/22/2018 12/21/2018  Weight (lbs) 259 lb 9.6 oz 260 lb 4.8 oz 260 lb 9.6 oz  Weight (kg) 117.754 kg 118.071 kg 118.207 kg      Telemetry    Afib with CVR - Personally Reviewed  ECG    None today   Physical Exam   General appearance: alert, no distress and morbidly obese Neck: no carotid bruit, no JVD and thyroid not enlarged, symmetric, no tenderness/mass/nodules Lungs: clear to auscultation bilaterally Heart: regular rate and rhythm, S1, S2 normal, no murmur, click, rub or gallop Abdomen: soft, non-tender; bowel sounds  normal; no masses,  no organomegaly Extremities: extremities normal, atraumatic, no cyanosis or edema Pulses: 2+ and symmetric Skin: Skin color, texture, turgor normal. No rashes or lesions Neurologic: Grossly normal Psych: Pleasant  Labs    Chemistry Recent Labs  Lab 12/20/18 1000 12/23/18 0627  NA 137 140  K 3.7 4.3  CL 104 104  CO2 22 25  GLUCOSE 115* 109*  BUN 13 15  CREATININE 1.08 0.98  CALCIUM 8.9 9.1  PROT 6.6  --   ALBUMIN 3.2*  --   AST 29  --   ALT 33  --   ALKPHOS 85  --   BILITOT 1.1  --   GFRNONAA >60 >60  GFRAA >60 >60  ANIONGAP 11 11     Hematology Recent Labs  Lab 12/20/18 1000 12/21/18 0528 12/23/18 0627  WBC 5.7  --  5.4  RBC 3.75*  --  3.49*  HGB 13.0  --  11.7*  HCT 38.4* 36.2* 36.0*  MCV 102.4*  --  103.2*  MCH 34.7*  --  33.5  MCHC 33.9  --  32.5  RDW 13.7  --  13.5  PLT 265  --  241    Cardiac  Enzymes Recent Labs  Lab 12/20/18 1000  TROPONINI 0.03*   No results for input(s): TROPIPOC in the last 168 hours.   BNP Recent Labs  Lab 12/20/18 1000  BNP 209.7*     DDimer No results for input(s): DDIMER in the last 168 hours.   Radiology    No results found.  Cardiac Studies   Echo 12/21/18 IMPRESSIONS   1. The left ventricle has hyperdynamic systolic function, with an ejection fraction of >65%. Left ventricular diastology could not be evaluated due to nondiagnostic images.  2. The mitral valve is normal in structure.  3. Aortic valve regurgitation is mild by color flow Doppler.  4. Moderate pericardial effusion.  5. The pericardial effusion is circumferential.  6. There is inversion of the right ventricular wall, inversion of the right atrial wall and excessive respiratory variation in the mitral valve spectral Doppler velocities.  7. When compared to the prior study: Compared to study 11/2018, the pericardial effusion has incrased in size and now significant effusion present posterior as well as anteriorly. There is RA  inversion and suggestive of subtle intermittent RV diastolic  collapse. There is some respiratory variation in the [facility] inlow pattern with respirations. The IVC is not well visualized. Concern for early tamponade.  Patient Profile     68 y.o. male with hx of CAD-PCI with recent hospitalization following PTCA complicated by guidewire perforation (on February 18) who presented with new onset A. fib with possible TIA and CHF. Hospital course complicated by enlarging pericardial effusion with some signs on the echocardiogram to suggest early tamponade physiology, however clinically he is not in tamponade. Seen by [facility] and plan for subxiphoid pericardial window Monday. Last dose of Eliquis AM of 12/21/18.  Assessment & Plan    Principal Problem:   TIA (transient ischemic attack) Active Problems:   CAD S/P percutaneous coronary angioplasty   Essential hypertension   Lower extremity edema   Shortness of breath   OSA (obstructive sleep apnea)   History of non-ST elevation myocardial infarction (NSTEMI)   Hyperlipidemia LDL goal <70   Atrial fibrillation with RVR (HCC)   Pericardial effusion without cardiac tamponade   New onset atrial fibrillation ([place])   1. Enlarging pericardial  effusion  - Early sign of tamponade physiology by echo but hemodynamically stable. Plan for pericardial window Monday as long remains stable. Last dose of Eliquis 3/5 AM.  - Per Dr. [name] "he could be placed on heparin while the Eliquis is washing out" will defer to Dr. [name] (see below reference heparin).  - Plan for pericardial window on Monday to allow Eliquis washout  2. CAD s/p successful scoring balloon angioplasty ostial 1st diag 12/05/18 (and DES PCI in November 2019) - Changed from Brilinta 90 mg BID to Plavix -- dose 300 mg then & 75 mg daily - Continue Statin. Off ASA due to anticoagulation  3. Atrial fibrillation-new onset (persistent)/with RVR - Anticoagulation as above. HR in 90-100s. Seems goes little  higher with movement. On Toprol XL 25mg  daily (reduce from 75 mg to avoid hypotension).  Change Toprol to metoprolol Tartrate 25 mg 4 times daily.   Concerning his possible uremia being related to admitting symptoms of possible TIA.  Would like to give a full 48 hours off of anticoagulation (Eliquis) to allow for any bleeding to heal.  Would then have low threshold to consider starting IV heparin tomorrow to run until midnight Sunday.  4. 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 He has intermittent pleuritic  chest pain. Worsen with deep breath and bending down. Will given PRN Toradol.  - on steroids as well  Pixie Casino, MD, [facility], [place] Director of the Advanced Lipid Disorders &  Cardiovascular Risk Reduction Clinic Diplomate of the American Board of Clinical Lipidology Attending Cardiologist  Direct Dial: [phone]  Fax: [phone]  Website:  [email or web address]

## 2018-12-24 ENCOUNTER — Encounter (HOSPITAL_COMMUNITY): Payer: Self-pay | Admitting: Anesthesiology

## 2018-12-24 DIAGNOSIS — G4733 Obstructive sleep apnea (adult) (pediatric): Secondary | ICD-10-CM

## 2018-12-24 LAB — BASIC METABOLIC PANEL
Anion gap: 8 (ref 5–15)
BUN: 19 mg/dL (ref 8–23)
CO2: 25 mmol/L (ref 22–32)
Calcium: 8.8 mg/dL — ABNORMAL LOW (ref 8.9–10.3)
Chloride: 108 mmol/L (ref 98–111)
Creatinine, Ser: 1.01 mg/dL (ref 0.61–1.24)
GFR calc Af Amer: >60 mL/min (ref >60)
Glucose, Bld: 98 mg/dL (ref 70–99)
Potassium: 4.4 mmol/L (ref 3.5–5.1)
Sodium: 141 mmol/L (ref 135–145)

## 2018-12-24 LAB — CBC
HCT: 35.9 % — ABNORMAL LOW (ref 39.0–52.0)
Hemoglobin: 11.9 g/dL — ABNORMAL LOW (ref 13.0–17.0)
MCH: 34.6 pg — ABNORMAL HIGH (ref 26.0–34.0)
MCHC: 33.1 g/dL (ref 30.0–36.0)
MCV: 104.4 fL — ABNORMAL HIGH (ref 80.0–100.0)
PLATELETS: 230 10*3/uL (ref 150–400)
RBC: 3.44 MIL/uL — ABNORMAL LOW (ref 4.22–5.81)
RDW: 13.5 % (ref 11.5–15.5)
WBC: 5.8 10*3/uL (ref 4.0–10.5)
nRBC: 0 % (ref 0.0–0.2)

## 2018-12-24 MED ORDER — LEVOFLOXACIN IN D5W 750 MG/150ML IV SOLN
750.0000 mg | INTRAVENOUS | Status: AC
  Administered 2018-12-25: 750 mg via INTRAVENOUS
  Filled 2018-12-24: qty 150

## 2018-12-24 MED ORDER — SODIUM CHLORIDE 0.9% FLUSH: 10.0000 mL | INTRAVENOUS | Status: DC | PRN

## 2018-12-24 NOTE — Progress Notes (Signed)
Pt inquiring about IV heparin to be started prior to pericardial window in am.  MD paged to clarify if heparin to be started.  Awaiting return call from Dr. Radford Pax.  Updated pt nurse, [place].  Will continue to monitor patient closely.

## 2018-12-24 NOTE — Progress Notes (Signed)
Progress Note   Subjective   Doing well today, the patient denies CP or SOB.  No new concerns  Inpatient Medications    Scheduled Meds: . atorvastatin  80 mg Oral q1800  . clopidogrel  75 mg Oral Daily  . colchicine  0.6 mg Oral BID  . metoprolol tartrate  25 mg Oral QID  . pantoprazole  40 mg Oral QHS  . potassium chloride  10 mEq Oral Daily  . [START ON 12/25/2018] predniSONE  40 mg Oral Q breakfast   Followed by  . [START ON 12/28/2018] predniSONE  30 mg Oral Q breakfast   Followed by  . [START ON 12/31/2018] predniSONE  20 mg Oral Q breakfast   Followed by  . [START ON 01/03/2019] predniSONE  10 mg Oral Q breakfast   Continuous Infusions:  PRN Meds: acetaminophen, alum & mag hydroxide-simeth, amitriptyline, ketorolac, LORazepam, methocarbamol, nitroGLYCERIN, senna-docusate   Vital Signs    Vitals:   12/23/18 2211 12/24/18 0017 12/24/18 0508 12/24/18 0821  BP: 112/73 107/88 113/74 108/81  Pulse: 81 89 76 75  Resp: 20 18 20    Temp: 98.5 F (36.9 C) 98.4 F (36.9 C) 98.5 F (36.9 C) 98.2 F (36.8 C)  TempSrc:   Oral Oral  SpO2: 92% 97% 100% 98%  Weight:   118 kg   Height:       No intake or output data in the 24 hours ending 12/24/18 1220 Filed Weights   12/22/18 0739 12/23/18 0518 12/24/18 0508  Weight: 118.1 kg 117.8 kg 118 kg    Telemetry    Rate controlled afib - Personally Reviewed  Physical Exam   GEN- The patient is very well appearing, alert and oriented x 3 today.   Head- normocephalic, atraumatic Eyes-  Sclera clear, conjunctiva pink Ears- hearing intact Oropharynx- clear Neck- supple, Lungs- Clear to ausculation bilaterally, normal work of breathing Heart- irregular rate and rhythm  GI- soft, NT, ND, + BS Extremities- no clubbing, cyanosis, or edema  MS- no significant deformity or atrophy Skin- no rash or lesion Psych- euthymic mood, full affect Neuro- strength and sensation are intact   Labs    Chemistry Recent Labs  Lab  12/20/18 1000 12/23/18 0627 12/24/18 0457  NA 137 140 141  K 3.7 4.3 4.4  CL 104 104 108  CO2 22 25 25   GLUCOSE 115* 109* 98  BUN 13 15 19   CREATININE 1.08 0.98 1.01  CALCIUM 8.9 9.1 8.8*  PROT 6.6  --   --   ALBUMIN 3.2*  --   --   AST 29  --   --   ALT 33  --   --   ALKPHOS 85  --   --   BILITOT 1.1  --   --   GFRNONAA >60 >60 >60  GFRAA >60 >60 >60  ANIONGAP 11 11 8      Hematology Recent Labs  Lab 12/20/18 1000 12/21/18 0528 12/23/18 0627 12/24/18 0457  WBC 5.7  --  5.4 5.8  RBC 3.75*  --  3.49* 3.44*  HGB 13.0  --  11.7* 11.9*  HCT 38.4* 36.2* 36.0* 35.9*  MCV 102.4*  --  103.2* 104.4*  MCH 34.7*  --  33.5 34.6*  MCHC 33.9  --  32.5 33.1  RDW 13.7  --  13.5 13.5  PLT 265  --  241 230    Cardiac Enzymes Recent Labs  Lab 12/20/18 1000  TROPONINI 0.03*   No results for input(s):  TROPIPOC in the last 168 hours.      Patient Profile     68 y.o. male with hx of CAD-PCI with recent hospitalization following PTCA complicated by guidewire perforation (on February 18)who presented withnew onset A. fib with possible TIA and CHF. Hospital course complicated by enlarging pericardial effusion with some signs on the echocardiogram to suggest early tamponade physiology, however clinically he is not in tamponade. Seen by [facility] and plan for subxiphoid pericardial window Monday. Last dose of Eliquis AM of 12/21/18.  Assessment & Plan    1.  Large pericardial effusion post PTCA with guidewire perforation Planned for subxiphoid window tomorrow.  NPO after midnight.  2. Paroxysmal atrial fibrillation New and possibly related to #1 above There is concern for recent TIA resume anticoagulation when able post pericardial drainage. Would consider cardioversion in 3-4 weeks  3. CAD s/p recent PTCA No ischemic symptoms No changes On plavix  4. OSA Compliance with CPAP is advised  5. HTN Stable No change required today  Thompson Grayer MD, [facility] 12/24/2018 12:20 PM

## 2018-12-24 NOTE — Anesthesia Preprocedure Evaluation (Addendum)
Anesthesia Evaluation  Patient identified by MRN, date of birth, ID band Patient awake    Reviewed: Allergy & Precautions, NPO status , Patient's Chart, lab work & pertinent test results, reviewed documented beta blocker date and time   Airway Mallampati: III  TM Distance: >3 FB Neck ROM: Full    Dental  (+) Dental Advisory Given   Pulmonary shortness of breath, sleep apnea ,    breath sounds clear to auscultation       Cardiovascular hypertension, Pt. on medications + angina + CAD, + Past MI and + Cardiac Stents  + dysrhythmias Atrial Fibrillation  Rhythm:Irregular Rate:Tachycardia  Echo 12/21/18 IMPRESSIONS  1. The left ventricle has hyperdynamic systolic function, with an ejection fraction of >65%. Left ventricular diastology could not be evaluated due to nondiagnostic images. 2. The mitral valve is normal in structure. 3. Aortic valve regurgitation is mild by color flow Doppler. 4. Moderate pericardial effusion. 5. The pericardial effusion is circumferential. 6. There is inversion of the right ventricular wall, inversion of the right atrial wall and excessive respiratory variation in the mitral valve spectral Doppler velocities. 7. When compared to the prior study: Compared to study 11/2018, the pericardial effusion has incrased in size and now significant effusion present posterior as well as anteriorly. There is RA inversion and suggestive of subtle intermittent RV diastolic  collapse. There is some respiratory variation in the [facility] inlow pattern with respirations. The IVC is not well visualized. Concern for early tamponade.   Neuro/Psych TIA   GI/Hepatic negative GI ROS, Neg liver ROS,   Endo/Other  negative endocrine ROS  Renal/GU negative Renal ROS     Musculoskeletal   Abdominal   Peds  Hematology negative hematology ROS (+) anemia ,   Anesthesia Other Findings   Reproductive/Obstetrics                             Lab Results  Component Value Date   WBC 5.5 12/25/2018   HGB 11.6 (L) 12/25/2018   HCT 35.3 (L) 12/25/2018   MCV 103.8 (H) 12/25/2018   PLT 218 12/25/2018   Lab Results  Component Value Date   CREATININE 1.05 12/25/2018   BUN 18 12/25/2018   NA 139 12/25/2018   K 3.8 12/25/2018   CL 107 12/25/2018   CO2 28 12/25/2018    Anesthesia Physical Anesthesia Plan  ASA: IV  Anesthesia Plan: General   Post-op Pain Management:    Induction: Intravenous  PONV Risk Score and Plan: 2 and Ondansetron, Dexamethasone and Treatment may vary due to age or medical condition  Airway Management Planned: Oral ETT  Additional Equipment: Arterial line, TEE, CVP and Ultrasound Guidance Line Placement  Intra-op Plan:   Post-operative Plan: Possible Post-op intubation/ventilation and Extubation in OR  Informed Consent: I have reviewed the patients History and Physical, chart, labs and discussed the procedure including the risks, benefits and alternatives for the proposed anesthesia with the patient or authorized representative who has indicated his/her understanding and acceptance.     Dental advisory given  Plan Discussed with: CRNA  Anesthesia Plan Comments:        Anesthesia Quick Evaluation

## 2018-12-25 ENCOUNTER — Ambulatory Visit (HOSPITAL_COMMUNITY): Payer: BLUE CROSS/BLUE SHIELD

## 2018-12-25 ENCOUNTER — Inpatient Hospital Stay (HOSPITAL_COMMUNITY): Payer: BLUE CROSS/BLUE SHIELD | Admitting: Anesthesiology

## 2018-12-25 ENCOUNTER — Inpatient Hospital Stay (HOSPITAL_COMMUNITY): Payer: BLUE CROSS/BLUE SHIELD

## 2018-12-25 ENCOUNTER — Encounter (HOSPITAL_COMMUNITY)
Admission: EM | Disposition: A | Discharge status: Home / Self Care | Payer: Self-pay | Source: Home / Self Care | Attending: Cardiology

## 2018-12-25 DIAGNOSIS — I3139 Other pericardial effusion (noninflammatory): Secondary | ICD-10-CM | POA: Diagnosis present

## 2018-12-25 DIAGNOSIS — I313 Pericardial effusion (noninflammatory): Secondary | ICD-10-CM

## 2018-12-25 HISTORY — PX: SUBXYPHOID PERICARDIAL WINDOW: SHX5075

## 2018-12-25 HISTORY — PX: TEE WITHOUT CARDIOVERSION: SHX5443

## 2018-12-25 LAB — GLUCOSE, CAPILLARY
GLUCOSE-CAPILLARY: 201 mg/dL — AB (ref 70–99)
Glucose-Capillary: 75 mg/dL (ref 70–99)
Glucose-Capillary: 112 mg/dL — ABNORMAL HIGH (ref 70–99)
Glucose-Capillary: 131 mg/dL — ABNORMAL HIGH (ref 70–99)

## 2018-12-25 LAB — BASIC METABOLIC PANEL
Anion gap: 4 — ABNORMAL LOW (ref 5–15)
BUN: 18 mg/dL (ref 8–23)
CO2: 28 mmol/L (ref 22–32)
Calcium: 8.4 mg/dL — ABNORMAL LOW (ref 8.9–10.3)
Chloride: 107 mmol/L (ref 98–111)
Creatinine, Ser: 1.05 mg/dL (ref 0.61–1.24)
GFR calc Af Amer: >60 mL/min (ref >60)
GFR calc non Af Amer: >60 mL/min (ref >60)
Glucose, Bld: 105 mg/dL — ABNORMAL HIGH (ref 70–99)
Potassium: 3.8 mmol/L (ref 3.5–5.1)
Sodium: 139 mmol/L (ref 135–145)

## 2018-12-25 LAB — CBC
HEMATOCRIT: 35.3 % — AB (ref 39.0–52.0)
HEMOGLOBIN: 11.6 g/dL — AB (ref 13.0–17.0)
MCH: 34.1 pg — ABNORMAL HIGH (ref 26.0–34.0)
MCHC: 32.9 g/dL (ref 30.0–36.0)
MCV: 103.8 fL — ABNORMAL HIGH (ref 80.0–100.0)
Platelets: 218 10*3/uL (ref 150–400)
RBC: 3.4 MIL/uL — ABNORMAL LOW (ref 4.22–5.81)
RDW: 13.5 % (ref 11.5–15.5)
WBC: 5.5 10*3/uL (ref 4.0–10.5)
nRBC: 0 % (ref 0.0–0.2)

## 2018-12-25 LAB — PREPARE RBC (CROSSMATCH)

## 2018-12-25 LAB — ABO/RH: ABO/RH(D): O POS

## 2018-12-25 SURGERY — CREATION, PERICARDIAL WINDOW, SUBXIPHOID APPROACH
Anesthesia: General

## 2018-12-25 MED ORDER — INSULIN ASPART 100 UNIT/ML ~~LOC~~ SOLN
0.0000 [IU] | Freq: Three times a day (TID) | SUBCUTANEOUS | Status: DC
  Administered 2018-12-25: 2 [IU] via SUBCUTANEOUS
  Administered 2018-12-25: 8 [IU] via SUBCUTANEOUS

## 2018-12-25 MED ORDER — OXYCODONE HCL 5 MG PO TABS: 5.0000 mg | ORAL_TABLET | ORAL | Status: DC | PRN

## 2018-12-25 MED ORDER — DEXAMETHASONE SODIUM PHOSPHATE 10 MG/ML IJ SOLN
INTRAMUSCULAR | Status: AC
  Filled 2018-12-25: qty 1

## 2018-12-25 MED ORDER — ACETAMINOPHEN 160 MG/5ML PO SOLN: 1000.0000 mg | Freq: Four times a day (QID) | ORAL | Status: DC

## 2018-12-25 MED ORDER — PHENYLEPHRINE 40 MCG/ML (10ML) SYRINGE FOR IV PUSH (FOR BLOOD PRESSURE SUPPORT)
PREFILLED_SYRINGE | INTRAVENOUS | Status: AC
  Filled 2018-12-25: qty 10

## 2018-12-25 MED ORDER — SUCCINYLCHOLINE CHLORIDE 200 MG/10ML IV SOSY
PREFILLED_SYRINGE | INTRAVENOUS | Status: DC | PRN
  Administered 2018-12-25: 140 mg via INTRAVENOUS

## 2018-12-25 MED ORDER — LACTATED RINGERS IV SOLN
INTRAVENOUS | Status: DC | PRN
  Administered 2018-12-25: 07:00:00 via INTRAVENOUS

## 2018-12-25 MED ORDER — DEXAMETHASONE SODIUM PHOSPHATE 10 MG/ML IJ SOLN
INTRAMUSCULAR | Status: DC | PRN
  Administered 2018-12-25: 10 mg via INTRAVENOUS

## 2018-12-25 MED ORDER — MIDAZOLAM HCL 5 MG/5ML IJ SOLN
INTRAMUSCULAR | Status: DC | PRN
  Administered 2018-12-25 (×2): 1 mg via INTRAVENOUS

## 2018-12-25 MED ORDER — FENTANYL CITRATE (PF) 100 MCG/2ML IJ SOLN
INTRAMUSCULAR | Status: AC
  Administered 2018-12-25: 50 ug via INTRAVENOUS
  Filled 2018-12-25: qty 2

## 2018-12-25 MED ORDER — ONDANSETRON HCL 4 MG/2ML IJ SOLN: 4.0000 mg | Freq: Four times a day (QID) | INTRAMUSCULAR | Status: DC | PRN

## 2018-12-25 MED ORDER — DEXTROSE-NACL 5-0.9 % IV SOLN
INTRAVENOUS | Status: DC
  Administered 2018-12-25: 11:00:00 via INTRAVENOUS

## 2018-12-25 MED ORDER — MIDAZOLAM HCL 2 MG/2ML IJ SOLN
INTRAMUSCULAR | Status: AC
  Filled 2018-12-25: qty 2

## 2018-12-25 MED ORDER — PHENYLEPHRINE 40 MCG/ML (10ML) SYRINGE FOR IV PUSH (FOR BLOOD PRESSURE SUPPORT)
PREFILLED_SYRINGE | INTRAVENOUS | Status: DC | PRN
  Administered 2018-12-25: 80 ug via INTRAVENOUS

## 2018-12-25 MED ORDER — ONDANSETRON HCL 4 MG/2ML IJ SOLN
INTRAMUSCULAR | Status: AC
  Filled 2018-12-25: qty 2

## 2018-12-25 MED ORDER — SODIUM CHLORIDE 0.9 % IV SOLN: 10.0000 mL/h | Freq: Once | INTRAVENOUS | Status: DC

## 2018-12-25 MED ORDER — SUCCINYLCHOLINE CHLORIDE 200 MG/10ML IV SOSY
PREFILLED_SYRINGE | INTRAVENOUS | Status: AC
  Filled 2018-12-25: qty 10

## 2018-12-25 MED ORDER — SODIUM CHLORIDE 0.9 % IV SOLN
INTRAVENOUS | Status: DC | PRN
  Administered 2018-12-25: 25 ug/min via INTRAVENOUS

## 2018-12-25 MED ORDER — ACETAMINOPHEN 500 MG PO TABS
1000.0000 mg | ORAL_TABLET | Freq: Four times a day (QID) | ORAL | Status: DC
  Administered 2018-12-25 – 2018-12-27 (×9): 1000 mg via ORAL
  Filled 2018-12-25 (×9): qty 2

## 2018-12-25 MED ORDER — FENTANYL CITRATE (PF) 250 MCG/5ML IJ SOLN
INTRAMUSCULAR | Status: AC
  Filled 2018-12-25: qty 5

## 2018-12-25 MED ORDER — SUGAMMADEX SODIUM 500 MG/5ML IV SOLN
INTRAVENOUS | Status: DC | PRN
  Administered 2018-12-25: 236.4 mg via INTRAVENOUS

## 2018-12-25 MED ORDER — SUGAMMADEX SODIUM 500 MG/5ML IV SOLN
INTRAVENOUS | Status: AC
  Filled 2018-12-25: qty 5

## 2018-12-25 MED ORDER — LIDOCAINE 2% (20 MG/ML) 5 ML SYRINGE
INTRAMUSCULAR | Status: DC | PRN
  Administered 2018-12-25: 100 mg via INTRAVENOUS

## 2018-12-25 MED ORDER — PROPOFOL 10 MG/ML IV BOLUS
INTRAVENOUS | Status: AC
  Filled 2018-12-25: qty 20

## 2018-12-25 MED ORDER — TRAMADOL HCL 50 MG PO TABS
50.0000 mg | ORAL_TABLET | Freq: Four times a day (QID) | ORAL | Status: DC | PRN
  Administered 2018-12-25: 100 mg via ORAL
  Filled 2018-12-25: qty 2

## 2018-12-25 MED ORDER — LABETALOL HCL 5 MG/ML IV SOLN
INTRAVENOUS | Status: DC | PRN
  Administered 2018-12-25 (×3): 5 mg via INTRAVENOUS

## 2018-12-25 MED ORDER — BISACODYL 5 MG PO TBEC
10.0000 mg | DELAYED_RELEASE_TABLET | Freq: Every day | ORAL | Status: DC
  Administered 2018-12-26: 10 mg via ORAL
  Filled 2018-12-25: qty 2

## 2018-12-25 MED ORDER — FENTANYL CITRATE (PF) 100 MCG/2ML IJ SOLN
INTRAMUSCULAR | Status: DC | PRN
  Administered 2018-12-25: 50 ug via INTRAVENOUS
  Administered 2018-12-25: 100 ug via INTRAVENOUS
  Administered 2018-12-25: 50 ug via INTRAVENOUS

## 2018-12-25 MED ORDER — PROPOFOL 10 MG/ML IV BOLUS
INTRAVENOUS | Status: DC | PRN
  Administered 2018-12-25: 100 mg via INTRAVENOUS

## 2018-12-25 MED ORDER — ONDANSETRON HCL 4 MG/2ML IJ SOLN
INTRAMUSCULAR | Status: DC | PRN
  Administered 2018-12-25: 4 mg via INTRAVENOUS

## 2018-12-25 MED ORDER — MORPHINE SULFATE (PF) 4 MG/ML IV SOLN
4.0000 mg | INTRAVENOUS | Status: DC | PRN
  Administered 2018-12-25 (×2): 4 mg via INTRAVENOUS
  Filled 2018-12-25 (×2): qty 1

## 2018-12-25 MED ORDER — ONDANSETRON HCL 4 MG/2ML IJ SOLN: 4.0000 mg | Freq: Once | INTRAMUSCULAR | Status: DC | PRN

## 2018-12-25 MED ORDER — LIDOCAINE 2% (20 MG/ML) 5 ML SYRINGE
INTRAMUSCULAR | Status: AC
  Filled 2018-12-25: qty 5

## 2018-12-25 MED ORDER — POTASSIUM CHLORIDE 10 MEQ/50ML IV SOLN: 10.0000 meq | Freq: Every day | INTRAVENOUS | Status: DC | PRN

## 2018-12-25 MED ORDER — 0.9 % SODIUM CHLORIDE (POUR BTL) OPTIME
TOPICAL | Status: DC | PRN
  Administered 2018-12-25: 1000 mL

## 2018-12-25 MED ORDER — FENTANYL CITRATE (PF) 100 MCG/2ML IJ SOLN
25.0000 ug | INTRAMUSCULAR | Status: DC | PRN
  Administered 2018-12-25 (×2): 50 ug via INTRAVENOUS

## 2018-12-25 MED ORDER — ROCURONIUM BROMIDE 50 MG/5ML IV SOSY
PREFILLED_SYRINGE | INTRAVENOUS | Status: DC | PRN
  Administered 2018-12-25: 40 mg via INTRAVENOUS
  Administered 2018-12-25: 30 mg via INTRAVENOUS

## 2018-12-25 MED ORDER — ROCURONIUM BROMIDE 50 MG/5ML IV SOSY
PREFILLED_SYRINGE | INTRAVENOUS | Status: AC
  Filled 2018-12-25: qty 5

## 2018-12-25 SURGICAL SUPPLY — 48 items
ADH SKN CLS APL DERMABOND .7 (GAUZE/BANDAGES/DRESSINGS) ×1
CANISTER SUCT 3000ML PPV (MISCELLANEOUS) ×2 IMPLANT
CATH THORACIC 28FR (CATHETERS) IMPLANT
CATH THORACIC 28FR RT ANG (CATHETERS) IMPLANT
CATH THORACIC 36FR (CATHETERS) IMPLANT
CATH THORACIC 36FR RT ANG (CATHETERS) IMPLANT
CONT SPEC 4OZ CLIKSEAL STRL BL (MISCELLANEOUS) ×2 IMPLANT
COVER WAND RF STERILE (DRAPES) ×2 IMPLANT
DERMABOND ADVANCED (GAUZE/BANDAGES/DRESSINGS) ×1
DERMABOND ADVANCED .7 DNX12 (GAUZE/BANDAGES/DRESSINGS) IMPLANT
DRAIN CHANNEL 28F RND 3/8 FF (WOUND CARE) IMPLANT
DRAPE LAPAROSCOPIC ABDOMINAL (DRAPES) ×2 IMPLANT
DRAPE SLUSH/WARMER DISC (DRAPES) ×2 IMPLANT
ELECT BLADE 6.5 EXT (BLADE) ×1 IMPLANT
ELECT REM PT RETURN 9FT ADLT (ELECTROSURGICAL) ×2
ELECTRODE REM PT RTRN 9FT ADLT (ELECTROSURGICAL) ×1 IMPLANT
GAUZE SPONGE 4X4 12PLY STRL (GAUZE/BANDAGES/DRESSINGS) ×2 IMPLANT
GLOVE EUDERMIC 7 POWDERFREE (GLOVE) ×2 IMPLANT
GOWN STRL REUS W/ TWL LRG LVL3 (GOWN DISPOSABLE) ×1 IMPLANT
GOWN STRL REUS W/ TWL XL LVL3 (GOWN DISPOSABLE) ×1 IMPLANT
GOWN STRL REUS W/TWL LRG LVL3 (GOWN DISPOSABLE) ×2
GOWN STRL REUS W/TWL XL LVL3 (GOWN DISPOSABLE) ×2
KIT BASIN OR (CUSTOM PROCEDURE TRAY) ×2 IMPLANT
KIT TURNOVER KIT B (KITS) ×2 IMPLANT
NS IRRIG 1000ML POUR BTL (IV SOLUTION) ×2 IMPLANT
PACK CHEST (CUSTOM PROCEDURE TRAY) ×2 IMPLANT
PAD ARMBOARD 7.5X6 YLW CONV (MISCELLANEOUS) ×4 IMPLANT
PAD ELECT DEFIB RADIOL ZOLL (MISCELLANEOUS) ×2 IMPLANT
SUT SILK  1 MH (SUTURE) ×1
SUT SILK 1 MH (SUTURE) IMPLANT
SUT VIC AB 0 CT1 18XCR BRD 8 (SUTURE) ×1 IMPLANT
SUT VIC AB 0 CT1 8-18 (SUTURE) ×2
SUT VIC AB 1 CTX 18 (SUTURE) ×1 IMPLANT
SUT VIC AB 2-0 CT1 27 (SUTURE)
SUT VIC AB 2-0 CT1 TAPERPNT 27 (SUTURE) IMPLANT
SUT VIC AB 2-0 CTX 27 (SUTURE) ×1 IMPLANT
SUT VIC AB 3-0 X1 27 (SUTURE) ×1 IMPLANT
SWAB COLLECTION DEVICE MRSA (MISCELLANEOUS) IMPLANT
SWAB CULTURE ESWAB REG 1ML (MISCELLANEOUS) IMPLANT
SYR 10ML LL (SYRINGE) IMPLANT
SYR 50ML SLIP (SYRINGE) IMPLANT
SYSTEM SAHARA CHEST DRAIN ATS (WOUND CARE) IMPLANT
TAPE CLOTH SURG 4X10 WHT LF (GAUZE/BANDAGES/DRESSINGS) ×1 IMPLANT
TOWEL GREEN STERILE (TOWEL DISPOSABLE) ×2 IMPLANT
TOWEL GREEN STERILE FF (TOWEL DISPOSABLE) ×2 IMPLANT
TRAP SPECIMEN MUCOUS 40CC (MISCELLANEOUS) ×2 IMPLANT
TRAY FOLEY SLVR 16FR TEMP STAT (SET/KITS/TRAYS/PACK) ×2 IMPLANT
WATER STERILE IRR 1000ML POUR (IV SOLUTION) ×4 IMPLANT

## 2018-12-25 NOTE — Transfer of Care (Signed)
Immediate Anesthesia Transfer of Care Note  Patient: Devon Mathews.  Procedure(s) Performed: SUBXYPHOID PERICARDIAL WINDOW (N/A ) TRANSESOPHAGEAL ECHOCARDIOGRAM (TEE) (N/A )  Patient Location: PACU  Anesthesia Type:General  Level of Consciousness: awake, alert , oriented and patient cooperative  Airway & Oxygen Therapy: Patient Spontanous Breathing and Patient connected to face mask oxygen  Post-op Assessment: Report given to RN, Post -op Vital signs reviewed and stable and Patient moving all extremities X 4  Post vital signs: Reviewed and stable  Last Vitals:  Vitals Value Taken Time  BP 115/80 12/25/2018  9:25 AM  Temp    Pulse 96 12/25/2018  9:28 AM  Resp 19 12/25/2018  9:29 AM  SpO2 95 % 12/25/2018  9:28 AM  Vitals shown include unvalidated device data.  Last Pain:  Vitals:   12/25/18 0448  TempSrc: Oral  PainSc:       Patients Stated Pain Goal: 0 (32/91/91 6606)  Complications: No apparent anesthesia complications

## 2018-12-25 NOTE — Progress Notes (Signed)
      [address]       [place], [address]             [phone]      S/p pericardial window  BP 117/74   Pulse (!) 105   Temp 98.1 F (36.7 C) (Oral)   Resp 19   Ht 5\' 9"  (1.753 m)   Wt 118.2 kg   SpO2 97%   BMI 38.48 kg/m   Intake/Output Summary (Last 24 hours) at 12/25/2018 1704 Last data filed at 12/25/2018 1633 Gross per 24 hour  Intake 1820.87 ml  Output 489 ml  Net 1331.87 ml   70 ml from chest tube  Doing well early postop  Remo Lipps C. Roxan Hockey, MD [facility] [phone]

## 2018-12-25 NOTE — Progress Notes (Signed)
Patient transferred on tele to pre-op with this RN and transporter.  Report given to Maia Plan, CRNA who is taking over patient's care.  Wife at bedside in pre-op area.  Patient alert with no distress noted.  IV abx in room.

## 2018-12-25 NOTE — Anesthesia Procedure Notes (Signed)
Procedure Name: Intubation Date/Time: 12/25/2018 7:48 AM Performed by: Renato Shin, CRNA Pre-anesthesia Checklist: Patient identified, Emergency Drugs available, Suction available and Patient being monitored Patient Re-evaluated:Patient Re-evaluated prior to induction Oxygen Delivery Method: Circle system utilized Preoxygenation: Pre-oxygenation with 100% oxygen Induction Type: IV induction Ventilation: Two handed mask ventilation required and Oral airway inserted - appropriate to patient size Laryngoscope Size: Glidescope and 4 Grade View: Grade I Tube type: Oral Tube size: 7.5 mm Number of attempts: 1 Airway Equipment and Method: Stylet,  Oral airway and Video-laryngoscopy Placement Confirmation: ETT inserted through vocal cords under direct vision,  positive ETCO2 and breath sounds checked- equal and bilateral Secured at: 21 cm Tube secured with: Tape Dental Injury: Teeth and Oropharynx as per pre-operative assessment  Difficulty Due To: Difficulty was unanticipated Comments: A. Boyce Medici, SRNA performed DVL; atraumatic placement of lips and teeth

## 2018-12-25 NOTE — Op Note (Signed)
CARDIOVASCULAR SURGERY OPERATIVE NOTE  12/25/2018  Surgeon:  [name], MD  First Assistant: Lars Pinks, PA-C   Preoperative Diagnosis:  Large pericardial window  Postoperative Diagnosis:  Large pericardial window  Procedure:  Subxyphoid pericardial window   Anesthesia:  General Endotracheal   Clinical History/Surgical Indication:   The patient is a 68 year old morbidly obese gentleman with a history of coronary disease status post DES to the mid LAD in 08/2018 after presenting with a non-ST segment elevation MI.  He said that he felt much better but then had a couple episodes of brief left-sided chest pain.  He underwent a nuclear stress test on 11/30/2018 which showed a medium sized severe perfusion defect in inferior and inferolateral walls with stress that was partially reversible at rest as well as a large defect of severe severity in the anterior wall from the base to the apex that was partially reversible at rest.  This was felt to be consistent with ischemia and was a high risk study.  Patient subsequently underwent cardiac catheterization on 12/05/2018 and the culprit was felt to be a 90% ostial first diagonal that was jailed by the previous stent.  The patient underwent PCI which was complicated by a wire perforation in the distal diagonal with development of a pericardial effusion and pericardial tamponade with hemodynamic instability.  Patient was treated with balloon occlusion of the diagonal branch and administration of protamine.  The previously placed LAD stent was widely patent.  The scarring arteries were otherwise stable.  He had an echocardiogram after the procedure as well as later that evening which showed a pericardial effusion that was classified as small and patient no evidence of tamponade.  Follow-up echo the next morning again showed a small pericardial effusion without tamponade.  Patient was discharged home on aspirin and Brilinta for his recent LAD  stent and colchicine for suspected pericarditis.  The patient said that he did well until this past Sunday when he developed significant shortness of breath and could not sleep all night.  He was sitting up in a chair and still could not sleep.  On Monday morning he went to his primary physician because he could not get into the cardiology office and was noted to be in atrial fibrillation with a rapid ventricular response.  Chest x-ray reportedly showed congestive heart failure.  His aspirin was apparently stopped and he was started on Eliquis.Marland Kitchen  He was treated with Lasix 20 mg but did not have an improvement in his breathing.  He called to the cardiology office and his Lasix was increased to 40 mg with some improvement.  Then yesterday morning he said that he did not feel right and had his neighbor come over to his house.  He began having some tremors in his right hand and some stuttering speech and the neighbor felt like he may be having a seizure or a stroke.  The symptoms lasted less than a few minutes.  The neighbor called 911 and the patient was brought to the emergency room where he felt much better.  He has been hemodynamically stable since presentation.  He has remained in atrial fibrillation with a controlled rate.  A follow-up echocardiogram today showed an increase in the size of his pericardial effusion to moderate and it was circumferential.  There were early signs of possible tamponade on echo with inversion of the right ventricular wall and inversion of the right atrial wall. I agree that the pericardial effusion should be drained. I discussed  the operative procedure with the patient including alternatives, benefits, and risk including but not limited to bleeding, blood transfusion, infection, injury to the heart, possible need for median sternotomy to manage bleeding, and recurrent pericardial effusion.   Preparation:  The patient was seen in the preoperative holding area and the correct  patient, correct operation were confirmed with the patient after reviewing the medical record and catheterization. The consent was signed by me. Preoperative antibiotics were given.  The patient was taken back to the operating room and positioned supine on the operating room table. After being placed under general endotracheal anesthesia by the anesthesia team a foley catheter was placed. The neck, chest, and abdomen were prepped with betadine soap and solution and draped in the usual sterile manner. A surgical time-out was taken and the correct patient and operative procedure were confirmed with the nursing and anesthesia staff.   TEE:  Performed by Dr. Suzette Battiest. This showed a large circumferential pericardial effusion. After drainage there was no pericardial effusion.  Subxyphoid pericardial window:  A 4 cm incision was made over the xyphoid process and carried down through the subcutaneous tissue using cautery until the midline fascia was encountered. The fascia was divided in the midline and the subxyphoid space entered. The anterior pericardium was visualized just at the diaphragm and it was opened with cautery. The pericardial space was entered and about 550 cc of old bloody fluid was removed. TEE confirmed that all of the fluid was removed. A 32 F right angle chest tube was placed in the inferior pericardial space through a separate small incision. Hemostasis was complete. The midline fascia was approximated with interrupted #0 vicryl sutures. The subcutaneous tissue was approximated with continuous 2-0 vicryl suture and the skin with 3-0 vicryl subcuticular suture. The sponge, needle, and instrument counts were correct according to the nurses. Dermabond was applied to the incision. The patient was awakened, extubated, and transported to the PACU in stable condition.

## 2018-12-25 NOTE — Anesthesia Postprocedure Evaluation (Signed)
Anesthesia Post Note  Patient: Devon Mathews.  Procedure(s) Performed: SUBXYPHOID PERICARDIAL WINDOW (N/A ) TRANSESOPHAGEAL ECHOCARDIOGRAM (TEE) (N/A )     Patient location during evaluation: PACU Anesthesia Type: General Level of consciousness: awake and alert Pain management: pain level controlled Vital Signs Assessment: post-procedure vital signs reviewed and stable Respiratory status: spontaneous breathing, nonlabored ventilation, respiratory function stable and patient connected to nasal cannula oxygen Cardiovascular status: blood pressure returned to baseline and stable Postop Assessment: no apparent nausea or vomiting Anesthetic complications: no    Last Vitals:  Vitals:   12/25/18 1313 12/25/18 1400  BP: 126/60 118/74  Pulse: 94 80  Resp:  12  Temp:    SpO2:  96%    Last Pain:  Vitals:   12/25/18 1314  TempSrc:   PainSc: 2                  Tiajuana Amass

## 2018-12-25 NOTE — Anesthesia Procedure Notes (Signed)
Arterial Line Insertion Start/End3/06/2019 6:50 AM, 12/25/2018 6:55 AM Performed by: CRNA  Patient location: Pre-op. Preanesthetic checklist: patient identified, IV checked, site marked, risks and benefits discussed, surgical consent, monitors and equipment checked, pre-op evaluation, timeout performed and anesthesia consent Lidocaine 1% used for infiltration Right, radial was placed Catheter size: 20 G Hand hygiene performed , maximum sterile barriers used  and Seldinger technique used Allen's test indicative of satisfactory collateral circulation Attempts: 2 Procedure performed without using ultrasound guided technique. Following insertion, dressing applied and Biopatch. Post procedure assessment: normal  Patient tolerated the procedure well with no immediate complications.

## 2018-12-25 NOTE — Anesthesia Procedure Notes (Addendum)
Central Venous Catheter Insertion Performed by: Suzette Battiest, MD, anesthesiologist Start/End3/06/2019 7:10 AM, 12/25/2018 7:20 AM Patient location: Pre-op. Preanesthetic checklist: patient identified, IV checked, site marked, risks and benefits discussed, surgical consent, monitors and equipment checked, pre-op evaluation, timeout performed and anesthesia consent Position: Trendelenburg Lidocaine 1% used for infiltration and patient sedated Hand hygiene performed , maximum sterile barriers used  and Seldinger technique used Catheter size: 8 Fr Total catheter length 16. Central line was placed.Double lumen Procedure performed using ultrasound guided technique. Ultrasound Notes:anatomy identified, needle tip was noted to be adjacent to the nerve/plexus identified, no ultrasound evidence of intravascular and/or intraneural injection and image(s) printed for medical record Attempts: 1 Following insertion, dressing applied, line sutured and Biopatch. Post procedure assessment: blood return through all ports  Patient tolerated the procedure well with no immediate complications.

## 2018-12-25 NOTE — Progress Notes (Signed)
TCTS  He had a stable weekend and is ready for pericardial window this morning. He and wife have no further questions.

## 2018-12-25 NOTE — Progress Notes (Signed)
Patient interviewed in the preop area. Patient able to confirm name, DOB, procedure, allergies, pin in right arm, npo status and no pain at this time. Patient able to move over to OR table with minimal assistance.   Leatha Gilding, RN

## 2018-12-26 ENCOUNTER — Encounter (HOSPITAL_COMMUNITY): Payer: Self-pay | Admitting: Surgery

## 2018-12-26 LAB — BASIC METABOLIC PANEL
ANION GAP: 9 (ref 5–15)
BUN: 18 mg/dL (ref 8–23)
CO2: 22 mmol/L (ref 22–32)
Calcium: 8.6 mg/dL — ABNORMAL LOW (ref 8.9–10.3)
Chloride: 105 mmol/L (ref 98–111)
Creatinine, Ser: 0.96 mg/dL (ref 0.61–1.24)
GFR calc Af Amer: >60 mL/min (ref >60)
GFR calc non Af Amer: >60 mL/min (ref >60)
Glucose, Bld: 117 mg/dL — ABNORMAL HIGH (ref 70–99)
Potassium: 4.5 mmol/L (ref 3.5–5.1)
Sodium: 136 mmol/L (ref 135–145)

## 2018-12-26 LAB — TYPE AND SCREEN
ABO/RH(D): O POS
Antibody Screen: NEGATIVE
Unit division: 0

## 2018-12-26 LAB — CBC
HCT: 37.5 % — ABNORMAL LOW (ref 39.0–52.0)
Hemoglobin: 12 g/dL — ABNORMAL LOW (ref 13.0–17.0)
MCH: 33.6 pg (ref 26.0–34.0)
MCHC: 32 g/dL (ref 30.0–36.0)
MCV: 105 fL — ABNORMAL HIGH (ref 80.0–100.0)
NRBC: 0 % (ref 0.0–0.2)
Platelets: 211 10*3/uL (ref 150–400)
RBC: 3.57 MIL/uL — ABNORMAL LOW (ref 4.22–5.81)
RDW: 13.3 % (ref 11.5–15.5)
WBC: 6.1 10*3/uL (ref 4.0–10.5)

## 2018-12-26 LAB — BPAM RBC
BLOOD PRODUCT EXPIRATION DATE: 202003162359
ISSUE DATE / TIME: 202003090911
Unit Type and Rh: 5100

## 2018-12-26 LAB — GLUCOSE, CAPILLARY
GLUCOSE-CAPILLARY: 102 mg/dL — AB (ref 70–99)
Glucose-Capillary: 106 mg/dL — ABNORMAL HIGH (ref 70–99)
Glucose-Capillary: 127 mg/dL — ABNORMAL HIGH (ref 70–99)

## 2018-12-26 NOTE — Progress Notes (Signed)
1 Day Post-Op Procedure(s) (LRB): SUBXYPHOID PERICARDIAL WINDOW (N/A) TRANSESOPHAGEAL ECHOCARDIOGRAM (TEE) (N/A) Subjective: No complaints. Minimal pain. Breathing fine.  Objective: Vital signs in last 24 hours: Temp:  [97.7 F (36.5 C)-98.2 F (36.8 C)] 97.7 F (36.5 C) (03/10 0745) Pulse Rate:  [65-105] 104 (03/10 0700) Cardiac Rhythm: Atrial fibrillation (03/09 2000) Resp:  [8-24] 20 (03/10 0700) BP: (94-129)/(59-80) 116/59 (03/10 0700) SpO2:  [89 %-99 %] 92 % (03/10 0700) Arterial Line BP: (112-151)/(55-81) 132/57 (03/10 0700)  Hemodynamic parameters for last 24 hours:    Intake/Output from previous day: 03/09 0701 - 03/10 0700 In: 2102.1 [P.O.:600; I.V.:1402.1] Out: 904 [Urine:375; Blood:20; Chest Tube:109] Intake/Output this shift: No intake/output data recorded.  General appearance: alert and cooperative Heart: irregularly irregular rhythm Lungs: clear to auscultation bilaterally Wound: incision ok minimal chest tube output  Lab Results: Recent Labs    12/25/18 0410 12/26/18 0442  WBC 5.5 6.1  HGB 11.6* 12.0*  HCT 35.3* 37.5*  PLT 218 211   BMET:  Recent Labs    12/25/18 0410 12/26/18 0442  NA 139 136  K 3.8 4.5  CL 107 105  CO2 28 22  GLUCOSE 105* 117*  BUN 18 18  CREATININE 1.05 0.96  CALCIUM 8.4* 8.6*    PT/INR: No results for input(s): LABPROT, INR in the last 72 hours. ABG No results found for: PHART, HCO3, TCO2, ACIDBASEDEF, O2SAT CBG (last 3)  Recent Labs    12/25/18 1508 12/25/18 2151 12/26/18 0654  GLUCAP 201* 131* 106*    Assessment/Plan: S/P Procedure(s) (LRB): SUBXYPHOID PERICARDIAL WINDOW (N/A) TRANSESOPHAGEAL ECHOCARDIOGRAM (TEE) (N/A)  POD 1 Hemodynamically stable  Remains in rate controlled atrial fib in the 90's Minimal chest tube output. Will probably remove later today. DC arterial and central lines. Would plan to resume Eliquis at discharge. The fluid drained at surgery looked old and dark, not acute  bleeding. On Prednisone taper and colchicine per cardiology for possible pericarditis.   LOS: 6 days    [name] 12/26/2018

## 2018-12-26 NOTE — Progress Notes (Signed)
Patient ID: Devon Mathews., male   DOB: 03/17/1951, 68 y.o.   MRN: [id number] TCTS Evening Rounds:  Hemodynamically stable today. Pericardial tube is out. Ambulating.  Plan home tomorrow. He is still waiting on 4E bed.

## 2018-12-26 NOTE — Progress Notes (Signed)
Progress Note  Patient Name: Devon Mathews. Date of Encounter: 12/26/2018  Primary Cardiologist: Skeet Latch, MD   Subjective   Postop day #1 subxiphoid pericardial cardial window performed by Dr. [name].  Patient is clinically improved and is asymptomatic.  He remains in A. fib.  Inpatient Medications    Scheduled Meds: . acetaminophen  1,000 mg Oral Q6H   Or  . acetaminophen (TYLENOL) oral liquid 160 mg/5 mL  1,000 mg Oral Q6H  . atorvastatin  80 mg Oral q1800  . bisacodyl  10 mg Oral Daily  . clopidogrel  75 mg Oral Daily  . colchicine  0.6 mg Oral BID  . insulin aspart  0-24 Units Subcutaneous TID AC & HS  . metoprolol tartrate  25 mg Oral QID  . pantoprazole  40 mg Oral QHS  . predniSONE  40 mg Oral Q breakfast   Followed by  . [START ON 12/28/2018] predniSONE  30 mg Oral Q breakfast   Followed by  . [START ON 12/31/2018] predniSONE  20 mg Oral Q breakfast   Followed by  . [START ON 01/03/2019] predniSONE  10 mg Oral Q breakfast   Continuous Infusions: . potassium chloride     PRN Meds: alum & mag hydroxide-simeth, amitriptyline, ketorolac, LORazepam, methocarbamol, morphine injection, ondansetron (ZOFRAN) IV, oxyCODONE, potassium chloride, senna-docusate, traMADol   Vital Signs    Vitals:   12/26/18 0500 12/26/18 0600 12/26/18 0700 12/26/18 0745  BP: 108/69 105/74 (!) 116/59   Pulse: 82 71 (!) 104   Resp: 12 12 20    Temp:    97.7 F (36.5 C)  TempSrc:    Oral  SpO2: 92% 95% 92%   Weight:      Height:        Intake/Output Summary (Last 24 hours) at 12/26/2018 0803 Last data filed at 12/26/2018 0600 Gross per 24 hour  Intake 2102.12 ml  Output 904 ml  Net 1198.12 ml   Last 3 Weights 12/25/2018 12/24/2018 12/23/2018  Weight (lbs) 260 lb 9.6 oz 260 lb 3.2 oz 259 lb 9.6 oz  Weight (kg) 118.207 kg 118.026 kg 117.754 kg      Telemetry    Atrial fibrillation- Personally Reviewed  ECG    Not performed today- Personally Reviewed  Physical Exam    GEN: No acute distress.   Neck: No JVD Cardiac:  Irregularly irregular, no murmurs, rubs, or gallops.  Respiratory: Clear to auscultation bilaterally. GI: Soft, nontender, non-distended  MS: No edema; No deformity. Neuro:  Nonfocal  Psych: Normal affect   Labs    Chemistry Recent Labs  Lab 12/20/18 1000  12/24/18 0457 12/25/18 0410 12/26/18 0442  NA 137   < > 141 139 136  K 3.7   < > 4.4 3.8 4.5  CL 104   < > 108 107 105  CO2 22   < > 25 28 22   GLUCOSE 115*   < > 98 105* 117*  BUN 13   < > 19 18 18   CREATININE 1.08   < > 1.01 1.05 0.96  CALCIUM 8.9   < > 8.8* 8.4* 8.6*  PROT 6.6  --   --   --   --   ALBUMIN 3.2*  --   --   --   --   AST 29  --   --   --   --   ALT 33  --   --   --   --   ALKPHOS 85  --   --   --   --  BILITOT 1.1  --   --   --   --   GFRNONAA >60   < > >60 >60 >60  GFRAA >60   < > >60 >60 >60  ANIONGAP 11   < > 8 4* 9   < > = values in this interval not displayed.     Hematology Recent Labs  Lab 12/24/18 0457 12/25/18 0410 12/26/18 0442  WBC 5.8 5.5 6.1  RBC 3.44* 3.40* 3.57*  HGB 11.9* 11.6* 12.0*  HCT 35.9* 35.3* 37.5*  MCV 104.4* 103.8* 105.0*  MCH 34.6* 34.1* 33.6  MCHC 33.1 32.9 32.0  RDW 13.5 13.5 13.3  PLT 230 218 211    Cardiac Enzymes Recent Labs  Lab 12/20/18 1000  TROPONINI 0.03*   No results for input(s): TROPIPOC in the last 168 hours.   BNP Recent Labs  Lab 12/20/18 1000  BNP 209.7*     DDimer No results for input(s): DDIMER in the last 168 hours.   Radiology    Dg Chest Port 1 View  Result Date: 12/25/2018 CLINICAL DATA:  Status post pericardial window EXAM: PORTABLE CHEST 1 VIEW COMPARISON:  12/20/2018 FINDINGS: Cardiac shadow has decreased somewhat in the interval from the prior exam. Pericardial drain is noted. Right jugular central line is seen in the distal superior vena cava. No pneumothorax or focal infiltrate is seen. IMPRESSION: Tubes and lines as described. No acute abnormality noted.  Electronically Signed   By: Inez Catalina M.D.   On: 12/25/2018 10:44    Cardiac Studies   None  Patient Profile     68 y.o. male with hx of CAD-PCI with recent hospitalization following PTCA complicated by guidewire perforation (on February 18)who presented withnew onset A. fib with possible TIA and CHF. Hospital course complicated by enlarging pericardial effusion with some signs on the echocardiogram to suggest early tamponade physiology, however clinically he is not in tamponade. Seen by CTCS and plan for subxiphoid pericardial window which was successfully performed yesterday. Last dose of Eliquis AM of 12/21/18.  Assessment & Plan    1: Pericardial tamponade- postop day 1 subxiphoid pericardial window performed by Dr. [name] yesterday without complication.  He still has a drainage tube in which is minimally draining.  He removed 550 cc of old blood with resolution of the effusion by TEE.  2: Coronary artery disease- status post diagonal branch Cutting Balloon atherectomy complicated by distal wire perforation causing pericardial tamponade  3: Atrial fibrillation- remains in A. fib with controlled ventricular response.  He will need to go back on Eliquis.  The timing of this will be determined by the cardiothoracic surgery group.  Anticipate 4 weeks of Eliquis oral anticoagulation followed by cardioversion  4: Hyper lipidemia- on statin therapy       For questions or updates, please contact [place] Please consult [email or web address] for contact info under        Signed, Quay Burow, MD  12/26/2018, 8:03 AM

## 2018-12-27 ENCOUNTER — Ambulatory Visit (HOSPITAL_COMMUNITY): Payer: BLUE CROSS/BLUE SHIELD

## 2018-12-27 ENCOUNTER — Ambulatory Visit: Payer: BLUE CROSS/BLUE SHIELD | Admitting: Cardiology

## 2018-12-27 MED ORDER — FUROSEMIDE 20 MG PO TABS: 20.0000 mg | ORAL_TABLET | Freq: Every day | ORAL | Status: DC

## 2018-12-27 MED ORDER — OXYCODONE-ACETAMINOPHEN 5-325 MG PO TABS: 1.0000 | ORAL_TABLET | Freq: Four times a day (QID) | ORAL | 0 refills | Status: AC | PRN

## 2018-12-27 MED ORDER — METOPROLOL SUCCINATE ER 50 MG PO TB24
50.0000 mg | ORAL_TABLET | Freq: Every day | ORAL | Status: DC
  Administered 2018-12-27: 50 mg via ORAL
  Filled 2018-12-27: qty 1

## 2018-12-27 MED ORDER — POTASSIUM CHLORIDE CRYS ER 20 MEQ PO TBCR
20.0000 meq | EXTENDED_RELEASE_TABLET | Freq: Once | ORAL | Status: AC
  Administered 2018-12-27: 20 meq via ORAL
  Filled 2018-12-27: qty 1

## 2018-12-27 MED ORDER — FUROSEMIDE 10 MG/ML IJ SOLN
40.0000 mg | Freq: Once | INTRAMUSCULAR | Status: AC
  Administered 2018-12-27: 40 mg via INTRAVENOUS
  Filled 2018-12-27: qty 4

## 2018-12-27 MED ORDER — APIXABAN 5 MG PO TABS
5.0000 mg | ORAL_TABLET | Freq: Two times a day (BID) | ORAL | Status: DC
  Administered 2018-12-27: 5 mg via ORAL
  Filled 2018-12-27: qty 1

## 2018-12-27 MED ORDER — PREDNISONE 20 MG PO TABS: ORAL_TABLET | ORAL | 0 refills | Status: DC

## 2018-12-27 MED ORDER — CLOPIDOGREL BISULFATE 75 MG PO TABS: 75.0000 mg | ORAL_TABLET | Freq: Every day | ORAL | 2 refills | Status: DC

## 2018-12-27 MED FILL — CLOPIDOGREL 75 MG TABLET: 75 | 30 days supply | Qty: 30 | Fill #0 | Status: TO

## 2018-12-27 MED FILL — OXYCODONE W/APAP 5/325 TAB: 5-325 | 3 days supply | Qty: 12 | Fill #0

## 2018-12-27 NOTE — Progress Notes (Signed)
2 Days Post-Op Procedure(s) (LRB): SUBXYPHOID PERICARDIAL WINDOW (N/A) TRANSESOPHAGEAL ECHOCARDIOGRAM (TEE) (N/A) Subjective:  No complaints. Ambulating well  Objective: Vital signs in last 24 hours: Temp:  [97.5 F (36.4 C)-98.8 F (37.1 C)] 97.5 F (36.4 C) (03/11 0300) Pulse Rate:  [67-110] 67 (03/11 0400) Cardiac Rhythm: Atrial fibrillation (03/10 2000) Resp:  [13-26] 20 (03/11 0400) BP: (105-131)/(55-96) 120/78 (03/11 0400) SpO2:  [93 %-97 %] 97 % (03/11 0400) Arterial Line BP: (149)/(61) 149/61 (03/10 0800)  Hemodynamic parameters for last 24 hours:    Intake/Output from previous day: 03/10 0701 - 03/11 0700 In: 600 [P.O.:600] Out: 1681 [Urine:1675; Chest Tube:6] Intake/Output this shift: No intake/output data recorded.  General appearance: alert and cooperative Heart: irregularly irregular rhythm Lungs: clear to auscultation bilaterally Wound: incision ok Edema in legs  Lab Results: Recent Labs    12/25/18 0410 12/26/18 0442  WBC 5.5 6.1  HGB 11.6* 12.0*  HCT 35.3* 37.5*  PLT 218 211   BMET:  Recent Labs    12/25/18 0410 12/26/18 0442  NA 139 136  K 3.8 4.5  CL 107 105  CO2 28 22  GLUCOSE 105* 117*  BUN 18 18  CREATININE 1.05 0.96  CALCIUM 8.4* 8.6*    PT/INR: No results for input(s): LABPROT, INR in the last 72 hours. ABG No results found for: PHART, HCO3, TCO2, ACIDBASEDEF, O2SAT CBG (last 3)  Recent Labs    12/26/18 0654 12/26/18 0741 12/26/18 1114  GLUCAP 106* 127* 102*    Assessment/Plan: S/P Procedure(s) (LRB): SUBXYPHOID PERICARDIAL WINDOW (N/A) TRANSESOPHAGEAL ECHOCARDIOGRAM (TEE) (N/A)  POD 2 Hemodynamically stable in atrial fib with rate 90's on Lopressor 25 qid. He was on Toprol 50 daily at home. Will resume this at discharge.   He is a few lbs over preop and has some edema in legs. Has been off his lasix in the hospital. Will give him a dose this am and send home on 40 mg daily until we see him back. He also need Kdur  20 meq daily.   Resume Eliquis today for atrial fib and continue Plavix 75 daily for stent.  Finish Prednisone taper and continue colchicine until seen back.   I need to see him in the office in two weeks with CXR and will remove his chest tube suture at that time.   He needs cardiology follow up in a couple weeks.  Plan home today.     LOS: 7 days    [name] 12/27/2018

## 2018-12-27 NOTE — Progress Notes (Signed)
Progress Note  Patient Name: Devon Mathews. Date of Encounter: 12/27/2018  Primary Cardiologist: Skeet Latch, MD   Subjective   Postop day #2subxiphoid pericardial cardial window performed by Dr. [name].  Patient is clinically improved and is asymptomatic.  He remains in A. Fib.Sched for DC home today  Inpatient Medications    Scheduled Meds: . acetaminophen  1,000 mg Oral Q6H  . atorvastatin  80 mg Oral q1800  . clopidogrel  75 mg Oral Daily  . colchicine  0.6 mg Oral BID  . metoprolol tartrate  25 mg Oral QID  . pantoprazole  40 mg Oral QHS  . predniSONE  40 mg Oral Q breakfast   Followed by  . [START ON 12/28/2018] predniSONE  30 mg Oral Q breakfast   Followed by  . [START ON 12/31/2018] predniSONE  20 mg Oral Q breakfast   Followed by  . [START ON 01/03/2019] predniSONE  10 mg Oral Q breakfast   Continuous Infusions:  PRN Meds: alum & mag hydroxide-simeth, amitriptyline, ketorolac, LORazepam, methocarbamol, ondansetron (ZOFRAN) IV, oxyCODONE, senna-docusate, traMADol   Vital Signs    Vitals:   12/27/18 0400 12/27/18 0745 12/27/18 0748 12/27/18 0817  BP: 120/78   (!) 143/85  Pulse: 67   90  Resp: 20     Temp:   98.2 F (36.8 C)   TempSrc:   Oral   SpO2: 97%  92%   Weight:  119.4 kg    Height:        Intake/Output Summary (Last 24 hours) at 12/27/2018 1275 Last data filed at 12/27/2018 0300 Gross per 24 hour  Intake 360 ml  Output 1681 ml  Net -1321 ml   Last 3 Weights 12/27/2018 12/25/2018 12/24/2018  Weight (lbs) 263 lb 4.8 oz 260 lb 9.6 oz 260 lb 3.2 oz  Weight (kg) 119.432 kg 118.207 kg 118.026 kg      Telemetry    Atrial fibrillation- Personally Reviewed  ECG    Not performed today- Personally Reviewed  Physical Exam   GEN: No acute distress.   Neck: No JVD Cardiac:  Irregularly irregular, no murmurs, rubs, or gallops.  Respiratory: Clear to auscultation bilaterally. GI: Soft, nontender, non-distended  MS: No edema; No deformity. Neuro:   Nonfocal  Psych: Normal affect   Labs    Chemistry Recent Labs  Lab 12/20/18 1000  12/24/18 0457 12/25/18 0410 12/26/18 0442  NA 137   < > 141 139 136  K 3.7   < > 4.4 3.8 4.5  CL 104   < > 108 107 105  CO2 22   < > 25 28 22   GLUCOSE 115*   < > 98 105* 117*  BUN 13   < > 19 18 18   CREATININE 1.08   < > 1.01 1.05 0.96  CALCIUM 8.9   < > 8.8* 8.4* 8.6*  PROT 6.6  --   --   --   --   ALBUMIN 3.2*  --   --   --   --   AST 29  --   --   --   --   ALT 33  --   --   --   --   ALKPHOS 85  --   --   --   --   BILITOT 1.1  --   --   --   --   GFRNONAA >60   < > >60 >60 >60  GFRAA >60   < > >  60 >60 >60  ANIONGAP 11   < > 8 4* 9   < > = values in this interval not displayed.     Hematology Recent Labs  Lab 12/24/18 0457 12/25/18 0410 12/26/18 0442  WBC 5.8 5.5 6.1  RBC 3.44* 3.40* 3.57*  HGB 11.9* 11.6* 12.0*  HCT 35.9* 35.3* 37.5*  MCV 104.4* 103.8* 105.0*  MCH 34.6* 34.1* 33.6  MCHC 33.1 32.9 32.0  RDW 13.5 13.5 13.3  PLT 230 218 211    Cardiac Enzymes Recent Labs  Lab 12/20/18 1000  TROPONINI 0.03*   No results for input(s): TROPIPOC in the last 168 hours.   BNP Recent Labs  Lab 12/20/18 1000  BNP 209.7*     DDimer No results for input(s): DDIMER in the last 168 hours.   Radiology    Dg Chest Port 1 View  Result Date: 12/25/2018 CLINICAL DATA:  Status post pericardial window EXAM: PORTABLE CHEST 1 VIEW COMPARISON:  12/20/2018 FINDINGS: Cardiac shadow has decreased somewhat in the interval from the prior exam. Pericardial drain is noted. Right jugular central line is seen in the distal superior vena cava. No pneumothorax or focal infiltrate is seen. IMPRESSION: Tubes and lines as described. No acute abnormality noted. Electronically Signed   By: Inez Catalina M.D.   On: 12/25/2018 10:44    Cardiac Studies   None  Patient Profile     68 y.o. male with hx of CAD-PCI with recent hospitalization following PTCA complicated by guidewire perforation (on  February 18)who presented withnew onset A. fib with possible TIA and CHF. Hospital course complicated by enlarging pericardial effusion with some signs on the echocardiogram to suggest early tamponade physiology, however clinically he is not in tamponade. Seen by CTCS and plan for subxiphoid pericardial window which was successfully performed yesterday. Last dose of Eliquis AM of 12/21/18.  Assessment & Plan    1: Pericardial tamponade- postop day 2 subxiphoid pericardial window performed by Dr. [name] on Monday without complication. Clin stable. Sched for DC home this AM  2: Coronary artery disease- status post diagonal branch Cutting Balloon atherectomy complicated by distal wire perforation causing pericardial tamponade  3: Atrial fibrillation- remains in A. fib with controlled ventricular response.  Ok to start back on Eliquis today per Dr [name]  Anticipate 4 weeks of Eliquis oral anticoagulation followed by cardioversion  4: Hyper lipidemia- on statin therapy  Will arrange OP F/U with Dr Oval [name] in 3 weeks who can then orchestrate the DCCV.     For questions or updates, please contact [place] Please consult [email or web address] for contact info under        Signed, Quay Burow, MD  12/27/2018, 8:22 AM

## 2018-12-27 NOTE — Discharge Summary (Signed)
Physician Discharge Summary  Patient ID: Devon Mathews. MRN: [id number] DOB/AGE: 67-14-1952 67 y.o.  Admit date: 12/20/2018 Discharge date: 12/27/2018  Admission Diagnoses: Principal Problem: Transient ischemic attack    Active Problems:   Pericardial effusion with cardiac tamponade    CAD S/P percutaneous coronary angioplasty   Essential hypertension   Lower extremity edema   Shortness of breath   OSA (obstructive sleep apnea)   History of non-ST elevation myocardial infarction (NSTEMI)   Hyperlipidemia LDL goal <70   Atrial fibrillation with RVR ([place])  Discharge Diagnoses:   Principal Problem: Transient ischemic attack    Active Problems:   Pericardial effusion with cardiac tamponade    CAD S/P percutaneous coronary angioplasty   Essential hypertension   Lower extremity edema   Shortness of breath   OSA (obstructive sleep apnea)   History of non-ST elevation myocardial infarction (NSTEMI)   Hyperlipidemia LDL goal <70   Atrial fibrillation with RVR ([place])     Discharged Condition: good  History of Present Illness: The patient is a 68 year old morbidly obese gentleman with a history of coronary disease status post DES to the mid LAD in 08/2018 after presenting with a non-ST segment elevation MI.  He said that he felt much better but then had a couple episodes of brief left-sided chest pain.  He underwent a nuclear stress test on 11/30/2018 which showed a medium sized severe perfusion defect in inferior and inferolateral walls with stress that was partially reversible at rest as well as a large defect of severe severity in the anterior wall from the base to the apex that was partially reversible at rest.  This was felt to be consistent with ischemia and was a high risk study.  Patient subsequently underwent cardiac catheterization on 12/05/2018 and the culprit was felt to be a 90% ostial first diagonal that was jailed by the previous stent.  The patient underwent PCI which was  complicated by a wire perforation in the distal diagonal with development of a pericardial effusion and pericardial tamponade with hemodynamic instability.  Patient was treated with balloon occlusion of the diagonal branch and administration of protamine.  The previously placed LAD stent was widely patent.  The scarring arteries were otherwise stable.  He had an echocardiogram after the procedure as well as later that evening which showed a pericardial effusion that was classified as small and patient no evidence of tamponade.  Follow-up echo the next morning again showed a small pericardial effusion without tamponade.  Patient was discharged home on aspirin and Brilinta for his recent LAD stent and colchicine for suspected pericarditis.  The patient said that he did well until this past Sunday when he developed significant shortness of breath and could not sleep all night.  He was sitting up in a chair and still could not sleep.  On Monday morning he went to his primary physician because he could not get into the cardiology office and was noted to be in atrial fibrillation with a rapid ventricular response.  Chest x-ray reportedly showed congestive heart failure.  His aspirin was apparently stopped and he was started on Eliquis.Marland Kitchen  He was treated with Lasix 20 mg but did not have an improvement in his breathing.  He called to the cardiology office and his Lasix was increased to 40 mg with some improvement.  Then on the day prior to admission,  he said that he did not feel right and had his neighbor come over to his house.  He  began having some tremors in his right hand and some stuttering speech and the neighbor felt like he may be having a seizure or a stroke.  The symptoms lasted less than a few minutes.  The neighbor called 911 and the patient was brought to the emergency room where he felt much better.  He remained hemodynamically stable after  Presentation to the hospital.   He has remained in atrial  fibrillation with a controlled rate.  A follow-up echocardiogram on the day of admission showed an increase in the size of his pericardial effusion to moderate and it was circumferential.  There were early signs of possible tamponade on echo with inversion of the right ventricular wall and inversion of the right atrial wall.  Hospital Course:  After admission to the hospital, neurology work-up was provided by Dr. Saralyn Pilar.  Full TIA work-up including CT of the brain, MRI of the brain.  MRA of the neck, and EEG.  Studies were essentially negative.  Patient remained neurologically stable.  Patient was seen in consultation by Dr. Mohammed Kindle for consideration of drainage of the enlarging pericardial effusion.  He felt the patient would be best served by holding his Eliquis for at least 48 hours prior to surgery.  The patient remained hemodynamically and neurologically stable in the interim and was taken to the operating room on 12/25/2018 where subxiphoid drainage of the pericardial effusion was carried out without complication.  Patient tolerated the procedure well.  Following surgery, he was transferred to the ICU.  He remained stable.  Zone taper was continued.  He had minimal drainage from the mediastinal tube 24 hours following surgery since the tube was removed on postop day 1.  Patient was followed postoperatively by Dr. Wynetta Emery.Marland Kitchen  He and Dr. Mohammed Kindle decided to resume Eliquis on 12/27/2018 for his persistent atrial fibrillation.  He will be started on Plavix for his recently placed stents in lieu of Brilinta.     Consults: cardiology and neurology  Significant Diagnostic Studies:      ECHOCARDIOGRAM LIMITED REPORT       Patient Name:   Devon Mathews. Date of Exam: 12/21/2018 Medical Rec #:  [id number]      Height:       69.0 in Accession #:    [id number]     Weight:       266.0 lb Date of Birth:  [birth year]       BSA:          2.33 m Patient Age:    68 years       BP:           109/84 mmHg Patient  Gender: M              HR:           102 bpm. Exam Location:  Inpatient    Procedure: 2D Echo and Intracardiac Opacification Agent  Indications:    TIA 435.9 / G45.9   History:        Patient has prior history of Echocardiogram examinations, most                 recent 12/06/2018. STEMI, Atrial Fibrillation; Signs/Symptoms:                 Shortness of Breath; Risk Factors: Diabetes, Hypertension and                 Obesity. Pericardial effusion.   Sonographer:    Talmage Coin Referring  Phys: Golf    1. The left ventricle has hyperdynamic systolic function, with an ejection fraction of >65%. Left ventricular diastology could not be evaluated due to nondiagnostic images.  2. The mitral valve is normal in structure.  3. Aortic valve regurgitation is mild by color flow Doppler.  4. Moderate pericardial effusion.  5. The pericardial effusion is circumferential.  6. There is inversion of the right ventricular wall, inversion of the right atrial wall and excessive respiratory variation in the mitral valve spectral Doppler velocities.  7. When compared to the prior study: Compared to study 11/2018, the pericardial effusion has incrased in size and now significant effusion present posterior as well as anteriorly. There is RA inversion and suggestive of subtle intermittent RV diastolic  collapse. There is some respiratory variation in the [facility] inlow pattern with respirations. The IVC is not well visualized. Concern for early tamponade.  FINDINGS  Left Ventricle: The left ventricle has hyperdynamic systolic function, with an ejection fraction of >65%. Left ventricular diastology could not be evaluated due to nondiagnostic images. Definity contrast agent was given IV to delineate the left  ventricular endocardial borders. Pericardium: A moderately sized pericardial effusion is present. The pericardial effusion is circumferential. There is inversion of the right  ventricular wall, inversion of the right atrial wall and excessive respiratory variation in the mitral valve  spectral Doppler velocities. Mitral Valve: The mitral valve is normal in structure. Mitral valve regurgitation is trivial by color flow Doppler. Aortic Valve: Aortic valve regurgitation is mild by color flow Doppler. Compared to previous exam: Compared to study 11/2018, the pericardial effusion has incrased in size and now significant effusion present posterior as well as anteriorly. There is RA inversion and suggestive of mild RV diastolic collapse. There is some  respiratory variation in the [facility] inlow pattern with respirations. The IVC is not well visualized. Concern for early tamponade.   LEFT VENTRICLE PLAX 2D (Teich) LV EF:          45.9 % LVIDd:          4.40 cm LVIDs:          3.40 cm LV PW:          0.70 cm LV IVS:         0.70 cm LV SV:          40 ml  LEFT ATRIUM         Index LA diam:    4.00 cm 1.72 cm/m    AORTA Ao Root diam: 3.20 cm    [name] MD Electronically signed by [name] MD Signature Date/Time: 12/21/2018/1:29:22 PM       EXAM: CT HEAD WITHOUT CONTRAST  TECHNIQUE: Contiguous axial images were obtained from the base of the skull through the vertex without intravenous contrast.  COMPARISON:  Orbit radiographs 11/07/2014.  FINDINGS: Brain: No midline shift, ventriculomegaly, mass effect, evidence of mass lesion, intracranial hemorrhage or evidence of cortically based acute infarction. Gray-white matter differentiation is within normal limits throughout the brain. Cerebral volume is within normal limits for age.  Vascular: Mild Calcified atherosclerosis at the skull base. No suspicious intracranial vascular hyperdensity.  Skull: Negative.  Sinuses/Orbits: Paranasal Visualized paranasal sinuses and mastoids are well pneumatized.  Other: No acute orbit or scalp soft tissue findings.  IMPRESSION: Normal for age non  contrast CT appearance of the brain.  EXAM: MRI HEAD WITHOUT CONTRAST  MRA HEAD WITHOUT CONTRAST  TECHNIQUE: Multiplanar, multiecho pulse  sequences of the brain and surrounding structures were obtained without intravenous contrast. Angiographic images of the head were obtained using MRA technique without contrast.  COMPARISON:  12/20/2018 CT head  FINDINGS: MRI HEAD FINDINGS  Brain: No acute infarction, hemorrhage, hydrocephalus, extra-axial collection or mass effect. 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 Few punctate nonspecific T2 FLAIR hyperintensities in subcortical and periventricular white matter are compatible with minimal chronic microvascular ischemic changes.  2-3 mm subependymal nodules (4 identified, series 7, image 18-19) of the right lateral ventricle.  Vascular: Normal flow voids.  Skull and upper cervical spine: Normal marrow signal.  Sinuses/Orbits: Negative.  Other: None.  MRA HEAD FINDINGS  Anterior circulation: No large vessel occlusion, aneurysm, or significant stenosis is identified.  Posterior circulation: No large vessel occlusion, aneurysm, or significant stenosis is identified.  Anatomic variation: None significant.  IMPRESSION: 1. No acute intracranial abnormality. 2. Minimal chronic microvascular ischemic changes of white matter. 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 3. 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 4. Normal MRA of the head.   Electronically Signed   By: Kristine Garbe M.D.   On: 12/20/2018 18:22  Treatments: Surgery  CARDIOVASCULAR SURGERY OPERATIVE NOTE  12/25/2018  Surgeon:  [name], MD  First Assistant: Lars Pinks, PA-C   Preoperative Diagnosis:  Large pericardial window  Postoperative Diagnosis:   Large pericardial window  Procedure:  Subxyphoid pericardial window   Anesthesia:  General Endotracheal   Clinical History/Surgical Indication:   The patient is a 68 year old morbidly obese gentleman with a history of coronary disease status post DES to the mid LAD in 08/2018 after presenting with a non-ST segment elevation MI. He said that he felt much better but then had a couple episodes of brief left-sided chest pain. He underwent a nuclear stress test on 11/30/2018 which showed a medium sized severe perfusion defect in inferior and inferolateral walls with stress that was partially reversible at rest as well as a large defect of severe severity in the anterior wall from the base to the apex that was partially reversible at rest. This was felt to be consistent with ischemia and was a high risk study. Patient subsequently underwent cardiac catheterization on 12/05/2018 and the culprit was felt to be a 90% ostial first diagonal that was jailed by the previous stent. The patient underwent PCI which was complicated by a wire perforation in the distal diagonal with development of a pericardial effusion and pericardial tamponade with hemodynamic instability. Patient was treated with balloon occlusion of the diagonal branch and administration of protamine. The previously placed LAD stent was widely patent. The scarring arteries were otherwise stable. He had an echocardiogram after the procedure as well as later that evening which showed a pericardial effusion that was classified as small and patient no evidence of tamponade. Follow-up echo the next morning again showed a small pericardial effusion without tamponade. Patient was discharged home on aspirin and Brilinta for his recent LAD stent and colchicine for suspected pericarditis. The patient said that he did well until this past Sunday when he developed significant shortness of breath and could not sleep all night. He was sitting up in  a chair and still could not sleep. On Monday morning he went to his primary physician because he could not get into the cardiology office and was noted to be in atrial fibrillation with a rapid ventricular response. Chest x-ray  reportedly showed congestive heart failure. His aspirin was apparently stopped and he was started on Eliquis.Marland Kitchen He was treated with Lasix 20 mg but did not have an improvement in his breathing. He called to the cardiology office and his Lasix was increased to 40 mg with some improvement. Then yesterday morning he said that he did not feel right and had his neighbor come over to his house. He began having some tremors in his right hand and some stuttering speech and the neighbor felt like he may be having a seizure or a stroke. The symptoms lasted less than a few minutes. The neighbor called 911 and the patient was brought to the emergency room where he felt much better. He has been hemodynamically stable since presentation. He has remained in atrial fibrillation with a controlled rate. A follow-up echocardiogram today showed an increase in the size of his pericardial effusion to moderate and it was circumferential. There were early signs of possible tamponade on echo with inversion of the right ventricular wall and inversion of the right atrial wall.I agree that the pericardial effusion should be drained. I discussed the operative procedure with the patient including alternatives, benefits, and risk including but not limited to bleeding, blood transfusion, infection, injury to the heart, possible need for median sternotomy to manage bleeding, and recurrent pericardial effusion.   Preparation:  The patient was seen in the preoperative holding area and the correct patient, correct operation were confirmed with the patient after reviewing the medical record and catheterization. The consent was signed by me. Preoperative antibiotics were given.  The patient was taken back to  the operating room and positioned supine on the operating room table. After being placed under general endotracheal anesthesia by the anesthesia team a foley catheter was placed. The neck, chest, and abdomen were prepped with betadine soap and solution and draped in the usual sterile manner. A surgical time-out was taken and the correct patient and operative procedure were confirmed with the nursing and anesthesia staff.   TEE:  Performed by Dr. Suzette Battiest. This showed a large circumferential pericardial effusion. After drainage there was no pericardial effusion.  Subxyphoid pericardial window:  A 4 cm incision was made over the xyphoid process and carried down through the subcutaneous tissue using cautery until the midline fascia was encountered. The fascia was divided in the midline and the subxyphoid space entered. The anterior pericardium was visualized just at the diaphragm and it was opened with cautery. The pericardial space was entered and about 550 cc of old bloody fluid was removed. TEE confirmed that all of the fluid was removed. A 32 F right angle chest tube was placed in the inferior pericardial space through a separate small incision. Hemostasis was complete. The midline fascia was approximated with interrupted #0 vicryl sutures. The subcutaneous tissue was approximated with continuous 2-0 vicryl suture and the skin with 3-0 vicryl subcuticular suture. The sponge, needle, and instrument counts were correct according to the nurses. Dermabond was applied to the incision. The patient was awakened, extubated, and transported to the PACU in stable condition. Discharge Exam: Blood pressure 94/68, pulse 80, temperature 98.2 F (36.8 C), temperature source Oral, resp. rate 16, height 5\' 9"  (1.753 m), weight 119.4 kg, SpO2 97 %.  Physical Exam General appearance: alert and cooperative Heart: irregularly irregular rhythm Lungs: clear to auscultation bilaterally Wound: incision  ok Edema in legs  Disposition:    Allergies as of 12/27/2018      Reactions   Penicillins Hives,  Nausea And Vomiting   Did it involve swelling of the face/tongue/throat, SOB, or low BP? No Did it involve sudden or severe rash/hives, skin peeling, or any reaction on the inside of your mouth or nose? No Did you need to seek medical attention at a hospital or doctor's office? No When did it last happen?30 + years If all above answers are "NO", may proceed with cephalosporin use.      Medication List    STOP taking these medications   hydrochlorothiazide 12.5 MG capsule Commonly known as:  MICROZIDE   ticagrelor 90 MG Tabs tablet Commonly known as:  BRILINTA     TAKE these medications   amitriptyline 25 MG tablet Commonly known as:  ELAVIL Take 25 mg by mouth at bedtime as needed for sleep.   atorvastatin 80 MG tablet Commonly known as:  LIPITOR Take 1 tablet (80 mg total) by mouth daily at 6 PM.   clopidogrel 75 MG tablet Commonly known as:  PLAVIX Take 1 tablet (75 mg total) by mouth daily. Start taking on:  December 28, 2018   colchicine 0.6 MG tablet Take 1 tablet (0.6 mg total) by mouth 2 (two) times daily.   Eliquis 5 MG Tabs tablet Generic drug:  apixaban Take 5 mg by mouth 2 (two) times daily.   furosemide 20 MG tablet Commonly known as:  LASIX Take 20-40 mg by mouth See admin instructions. Take 40 mg Bid 12/20/18 and 12/21/18. Starting 12/22/18 daily   methocarbamol 500 MG tablet Commonly known as:  ROBAXIN Take 500 mg by mouth 3 (three) times daily as needed for muscle spasms.   metoprolol succinate 50 MG 24 hr tablet Commonly known as:  TOPROL-XL Take 1 tablet (50 mg total) by mouth daily. Take with or immediately following a meal.   nitroGLYCERIN 0.4 MG SL tablet Commonly known as:  NITROSTAT Place 1 tablet (0.4 mg total) under the tongue every 5 (five) minutes x 3 doses as needed for chest pain.   oxyCODONE-acetaminophen 5-325 MG  tablet Commonly known as:  Percocet Take 1 tablet by mouth every 6 (six) hours as needed for up to 3 days for severe pain.   pantoprazole 40 MG tablet Commonly known as:  PROTONIX TAKE ONE TABLET BY MOUTH ONE TIME DAILY. must make follow up appointment for further refills What changed:  See the new instructions.   potassium chloride 10 MEQ tablet Commonly known as:  K-DUR,KLOR-CON Take 10 mEq by mouth daily.   predniSONE 20 MG tablet Commonly known as:  DELTASONE Take 3 tablets by mouth with breakfast daily x3 days beginning 12/28/2018 then Take 2 tablets by mouth with breakfast daily x3 days then  Take 1 tablet by mouth with breakfast daily for 3 days then Stop.   TYLENOL PO Take 2 tablets by mouth as needed (help releive pressure in his chest).      Follow-up Information    [name], MD. Go on 01/10/2019.   Specialty:  Cardiothoracic Surgery Why:  You have an appointment with Dr. Caffie Pinto on Wednesday, 01/10/2019 at 4:30 PM Contact information: [place] [address] [phone]           Signed: Antony Odea, PA-C 12/27/2018, 10:45 AM

## 2018-12-27 NOTE — Discharge Instructions (Signed)
Discharge Instructions:  1. You may shower, please wash incisions daily with soap and water and keep dry.  If you wish to cover wounds with dressing you may do so but please keep clean and change daily.  No tub baths or swimming until incisions have completely healed.  If your incisions become red or develop any drainage please call our office at [phone]  2. No Driving until cleared by [facility] office and you are no longer using narcotic pain medications  3. Monitor your weight daily.. Please use the same scale and weigh at same time... If you gain 5-10 lbs in 48 hours with associated lower extremity swelling, please contact our office at [phone]  4. Fever of 101.5 for at least 24 hours with no source, please contact our office at [phone]  5. Activity- up as tolerated, please walk at least 3 times per day.  Avoid strenuous activity, no lifting, pushing, or pulling with your arms over 8-10 lbs for a minimum of 6 weeks  6. If any questions or concerns arise, please do not hesitate to contact our office at [phone]

## 2018-12-29 ENCOUNTER — Ambulatory Visit (HOSPITAL_COMMUNITY): Payer: BLUE CROSS/BLUE SHIELD

## 2019-01-01 ENCOUNTER — Ambulatory Visit (HOSPITAL_COMMUNITY): Payer: BLUE CROSS/BLUE SHIELD

## 2019-01-03 ENCOUNTER — Ambulatory Visit (HOSPITAL_COMMUNITY): Payer: BLUE CROSS/BLUE SHIELD

## 2019-01-05 ENCOUNTER — Ambulatory Visit (HOSPITAL_COMMUNITY): Payer: BLUE CROSS/BLUE SHIELD

## 2019-01-08 ENCOUNTER — Ambulatory Visit (HOSPITAL_COMMUNITY): Payer: BLUE CROSS/BLUE SHIELD

## 2019-01-09 ENCOUNTER — Other Ambulatory Visit: Payer: Self-pay | Admitting: *Deleted

## 2019-01-09 ENCOUNTER — Other Ambulatory Visit: Payer: Self-pay

## 2019-01-09 DIAGNOSIS — I313 Pericardial effusion (noninflammatory): Secondary | ICD-10-CM

## 2019-01-09 DIAGNOSIS — I3139 Other pericardial effusion (noninflammatory): Secondary | ICD-10-CM

## 2019-01-10 ENCOUNTER — Ambulatory Visit
Admission: RE | Admit: 2019-01-10 | Discharge: 2019-01-10 | Disposition: A | Discharge status: Home / Self Care | Payer: BLUE CROSS/BLUE SHIELD | Source: Ambulatory Visit | Attending: Surgery | Admitting: Surgery

## 2019-01-10 ENCOUNTER — Ambulatory Visit (INDEPENDENT_AMBULATORY_CARE_PROVIDER_SITE_OTHER): Payer: Self-pay | Admitting: Surgery

## 2019-01-10 ENCOUNTER — Ambulatory Visit (HOSPITAL_COMMUNITY): Payer: BLUE CROSS/BLUE SHIELD

## 2019-01-10 ENCOUNTER — Encounter: Payer: Self-pay | Admitting: Surgery

## 2019-01-10 VITALS — BP 124/72 | HR 89 | Temp 98.4°F | Resp 20 | Ht 69.0 in | Wt 251.0 lb

## 2019-01-10 DIAGNOSIS — I3139 Other pericardial effusion (noninflammatory): Secondary | ICD-10-CM

## 2019-01-10 DIAGNOSIS — I313 Pericardial effusion (noninflammatory): Secondary | ICD-10-CM | POA: Diagnosis not present

## 2019-01-10 DIAGNOSIS — Z09 Encounter for follow-up examination after completed treatment for conditions other than malignant neoplasm: Secondary | ICD-10-CM

## 2019-01-10 NOTE — Progress Notes (Signed)
HPI: Patient returns for routine postoperative follow-up having undergone subxiphoid pericardial window on 12/25/2018. The patient's early postoperative recovery while in the hospital was notable for an uncomplicated postoperative course.  He was discharged home on Eliquis for atrial fibrillation that developed since he developed a large pericardial effusion. Since hospital discharge the patient reports that he has been feeling well.  He has been watching his diet closely and lost significant weight.  He is walking without chest pain or shortness of breath.  He has had no dizziness.   Current Outpatient Medications  Medication Sig Dispense Refill   Acetaminophen (TYLENOL PO) Take 2 tablets by mouth as needed (help releive pressure in his chest).     amitriptyline (ELAVIL) 25 MG tablet Take 25 mg by mouth at bedtime as needed for sleep.     atorvastatin (LIPITOR) 80 MG tablet Take 1 tablet (80 mg total) by mouth daily at 6 PM. 30 tablet 11   clopidogrel (PLAVIX) 75 MG tablet Take 1 tablet (75 mg total) by mouth daily. 30 tablet 2   colchicine 0.6 MG tablet Take 1 tablet (0.6 mg total) by mouth 2 (two) times daily. 60 tablet 3   ELIQUIS 5 MG TABS tablet Take 5 mg by mouth 2 (two) times daily.     furosemide (LASIX) 20 MG tablet Take 20-40 mg by mouth See admin instructions. Take 40 mg Bid 12/20/18 and 12/21/18. Starting 12/22/18 daily     methocarbamol (ROBAXIN) 500 MG tablet Take 500 mg by mouth 3 (three) times daily as needed for muscle spasms.     metoprolol succinate (TOPROL-XL) 50 MG 24 hr tablet Take 1 tablet (50 mg total) by mouth daily. Take with or immediately following a meal. 30 tablet 11   nitroGLYCERIN (NITROSTAT) 0.4 MG SL tablet Place 1 tablet (0.4 mg total) under the tongue every 5 (five) minutes x 3 doses as needed for chest pain. 25 tablet 11   pantoprazole (PROTONIX) 40 MG tablet TAKE ONE TABLET BY MOUTH ONE TIME DAILY. must make follow up appointment for further  refills (Patient taking differently: Take 40 mg by mouth at bedtime. ) 15 tablet 0   potassium chloride (K-DUR,KLOR-CON) 10 MEQ tablet Take 10 mEq by mouth daily.     No current facility-administered medications for this visit.     Physical Exam: BP 124/72    Pulse 89    Temp 98.4 F (36.9 C) (Oral)    Resp 20    Ht 5\' 9"  (1.753 m)    Wt 251 lb (113.9 kg)    SpO2 96% Comment: RA   BMI 37.07 kg/m  He looks well. Cardiac exam shows a regular rate and rhythm with normal heart sounds. Lungs are clear. The epigastric incision is healing well.  The chest tube site is healing and suture was removed.  Diagnostic Tests:  CLINICAL DATA:  Status post pericardial window  EXAM: CHEST - 2 VIEW  COMPARISON:  12/25/2018  FINDINGS: Cardiac shadow is stable. The lungs are well aerated bilaterally. No focal infiltrate or sizable effusion is seen. No acute bony abnormality is noted.  IMPRESSION: No acute abnormality seen.   Electronically Signed   By: Inez Catalina M.D.   On: 01/10/2019 16:02  Impression:  Overall I think he is making a good recovery following his surgery.  His chest x-ray shows a normal pericardial silhouette so I do not think he has reaccumulated a pericardial effusion.  His rhythm seems normal today so he  may have returned to sinus rhythm.  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  I told him that he can return to work as of 02/20/2019 with a 10 pound lifting restriction until 02/24/2019.  I gave him a return to work slip today.  Plan:  He is going to follow-up with cardiology but does not have a follow-up appointment at this time.  He is waiting for cardiology to call and schedule that.  He will return to see me if he has any problems with his incision.   [name], MD [facility] [phone]

## 2019-01-12 ENCOUNTER — Telehealth (HOSPITAL_COMMUNITY): Payer: Self-pay

## 2019-01-12 ENCOUNTER — Ambulatory Visit (HOSPITAL_COMMUNITY): Payer: BLUE CROSS/BLUE SHIELD

## 2019-01-12 NOTE — Telephone Encounter (Signed)
Called and spoke with pt in regards to CR, adv we have recv'd the pt referral. And at this time we are not scheduling due to the COVID-19. Once we have resume scheduling we will contact the pt. She/He verbalized understanding. °Gloria W. Support Rep II °

## 2019-01-15 ENCOUNTER — Ambulatory Visit (HOSPITAL_COMMUNITY): Payer: BLUE CROSS/BLUE SHIELD

## 2019-01-17 ENCOUNTER — Ambulatory Visit (HOSPITAL_COMMUNITY): Payer: BLUE CROSS/BLUE SHIELD

## 2019-01-19 ENCOUNTER — Ambulatory Visit (HOSPITAL_COMMUNITY): Payer: BLUE CROSS/BLUE SHIELD

## 2019-01-22 ENCOUNTER — Ambulatory Visit (HOSPITAL_COMMUNITY): Payer: BLUE CROSS/BLUE SHIELD

## 2019-01-24 ENCOUNTER — Ambulatory Visit (HOSPITAL_COMMUNITY): Payer: BLUE CROSS/BLUE SHIELD

## 2019-01-26 ENCOUNTER — Ambulatory Visit (HOSPITAL_COMMUNITY): Payer: BLUE CROSS/BLUE SHIELD

## 2019-01-29 ENCOUNTER — Ambulatory Visit (HOSPITAL_COMMUNITY): Payer: BLUE CROSS/BLUE SHIELD

## 2019-01-31 ENCOUNTER — Ambulatory Visit (HOSPITAL_COMMUNITY): Payer: BLUE CROSS/BLUE SHIELD

## 2019-02-02 ENCOUNTER — Ambulatory Visit (HOSPITAL_COMMUNITY): Payer: BLUE CROSS/BLUE SHIELD

## 2019-02-05 ENCOUNTER — Telehealth: Payer: Self-pay | Admitting: Cardiovascular Disease

## 2019-02-05 ENCOUNTER — Ambulatory Visit (HOSPITAL_COMMUNITY): Payer: BLUE CROSS/BLUE SHIELD

## 2019-02-05 NOTE — Telephone Encounter (Signed)
Discussed with dr [name], patient given okay for dental work. Left message for dr byrd's office, will fax this note to them.

## 2019-02-05 NOTE — Telephone Encounter (Signed)
1. What dental office are you calling from? Dr. Theone Murdoch  2. What is your office phone number? [phone]  3. What is your fax number? [phone]  4. What type of procedure is the patient having performed? Patient has toothache. They want to know if it will be ok to do dental work if needed.   5. What date is procedure scheduled or is the patient there now? 02/05/19, patient will be in later today.   (if the patient is at the dentist's office question goes to their cardiologist if he/she is in the office.  If not, question should go to the DOD).   6. What is your question (ex. Antibiotics prior to procedure, holding medication-we need to know how long dentist wants pt to hold med)? They want to know if it will be ok to do dental work if needed.

## 2019-02-07 ENCOUNTER — Ambulatory Visit (HOSPITAL_COMMUNITY): Payer: BLUE CROSS/BLUE SHIELD

## 2019-02-07 ENCOUNTER — Telehealth: Payer: Self-pay | Admitting: *Deleted

## 2019-02-07 ENCOUNTER — Telehealth (HOSPITAL_COMMUNITY): Payer: Self-pay | Admitting: Cardiac Rehabilitation

## 2019-02-07 NOTE — Telephone Encounter (Signed)
Phone call to patient to discuss outpatient cardiac rehab.  Pt is interested in participating once program reopens from [place] 19 precautions.  Pt is eager to learn lifestyle modifications to reduce risk factors. Pt exercise at home includes 30 minutes walking dog daily.  Pt is following Covid prevention measures. Pt offered emotional support and reassurance. Pt expressed appreciation for the call.  Andi Hence, RN, BSN Cardiac Pulmonary Rehab

## 2019-02-07 NOTE — Telephone Encounter (Signed)
Left message for patient to call and schedule virtual/telephone 6 mos follow up visit with Dr. Oval [name]

## 2019-02-09 ENCOUNTER — Ambulatory Visit (HOSPITAL_COMMUNITY): Payer: BLUE CROSS/BLUE SHIELD

## 2019-02-12 ENCOUNTER — Ambulatory Visit (HOSPITAL_COMMUNITY): Payer: BLUE CROSS/BLUE SHIELD

## 2019-02-12 ENCOUNTER — Telehealth: Payer: Self-pay | Admitting: Cardiovascular Disease

## 2019-02-12 NOTE — Telephone Encounter (Signed)
Pt calling stating that Dr. [name] prescribed him colchicine in the hospital and stated that he needed to take this medication for 60-90 days and pt would like to know if he needed to continue to take this medication? Pt would like a call back at [phone] concerning this matter. Please address

## 2019-02-12 NOTE — Telephone Encounter (Signed)
Please continue through 03/08/19.

## 2019-02-12 NOTE — Telephone Encounter (Signed)
Advised patient, verbalized understanding  

## 2019-02-12 NOTE — Telephone Encounter (Signed)
Pt has completed 60 days of the Colchicine started by Dr. [name] in the hospital and is wondering if he needs to get one more refill... his next appt with Dr. Oval [name] is 02/27/19... advised him that I will forward to Dr. Oval [name] for her review.

## 2019-02-14 ENCOUNTER — Ambulatory Visit (HOSPITAL_COMMUNITY): Payer: BLUE CROSS/BLUE SHIELD

## 2019-02-16 ENCOUNTER — Ambulatory Visit (HOSPITAL_COMMUNITY): Payer: BLUE CROSS/BLUE SHIELD

## 2019-02-19 ENCOUNTER — Ambulatory Visit (HOSPITAL_COMMUNITY): Payer: BLUE CROSS/BLUE SHIELD

## 2019-02-21 ENCOUNTER — Ambulatory Visit (HOSPITAL_COMMUNITY): Payer: BLUE CROSS/BLUE SHIELD

## 2019-02-26 ENCOUNTER — Telehealth: Payer: Self-pay | Admitting: Cardiovascular Disease

## 2019-02-26 NOTE — Telephone Encounter (Signed)
Patient will try to get new policy number for insurance before Virtual. If not, then Patient would like to cancel appointment.

## 2019-02-27 ENCOUNTER — Encounter: Payer: Self-pay | Admitting: Cardiovascular Disease

## 2019-02-27 ENCOUNTER — Telehealth (INDEPENDENT_AMBULATORY_CARE_PROVIDER_SITE_OTHER): Payer: Self-pay | Admitting: Cardiovascular Disease

## 2019-02-27 DIAGNOSIS — E785 Hyperlipidemia, unspecified: Secondary | ICD-10-CM

## 2019-02-27 DIAGNOSIS — I4891 Unspecified atrial fibrillation: Secondary | ICD-10-CM

## 2019-02-27 DIAGNOSIS — I2510 Atherosclerotic heart disease of native coronary artery without angina pectoris: Secondary | ICD-10-CM

## 2019-02-27 DIAGNOSIS — Z9861 Coronary angioplasty status: Secondary | ICD-10-CM

## 2019-02-27 DIAGNOSIS — I313 Pericardial effusion (noninflammatory): Secondary | ICD-10-CM

## 2019-02-27 DIAGNOSIS — I3139 Other pericardial effusion (noninflammatory): Secondary | ICD-10-CM

## 2019-02-27 DIAGNOSIS — I10 Essential (primary) hypertension: Secondary | ICD-10-CM

## 2019-02-27 NOTE — Patient Instructions (Addendum)
Medication Instructions:  STOP COLCHICINE 03/08/19  If you need a refill on your cardiac medications before your next appointment, please call your pharmacy.   Lab work: NONE   Testing/Procedures: Your physician has requested that you have an echocardiogram. Echocardiography is a painless test that uses sound waves to create images of your heart. It provides your doctor with information about the size and shape of your heart and how well your heart's chambers and valves are working. This procedure takes approximately one hour. There are no restrictions for this procedure. June 2020 [facility] AT [address] THE OFFICE WILL CALL YOU TO SCHEDULE   Follow-Up: At [facility], you and your health needs are our priority.  As part of our continuing mission to provide you with exceptional heart care, we have created designated Provider Care Teams.  These Care Teams include your primary Cardiologist (physician) and Advanced Practice Providers (APPs -  Physician Assistants and Nurse Practitioners) who all work together to provide you with the care you need, when you need it. You will need a follow up appointment in 4 months.  Please call our office 2 months in advance to schedule this appointment.  You may see Skeet Latch, MD or one of the following Advanced Practice Providers on your designated Care Team:   Rydell Wiegel, PA-C Roby Lofts, [place] . Sande Rives, PA-C  Any Other Special Instructions Will Be Listed Below (If Applicable).   Echocardiogram An echocardiogram is a procedure that uses painless sound waves (ultrasound) to produce an image of the heart. Images from an echocardiogram can provide important information about:  Signs of coronary artery disease (CAD).  Aneurysm detection. An aneurysm is a weak or damaged part of an artery wall that bulges out from the normal force of blood pumping through the body.  Heart size and shape. Changes in the size or shape  of the heart can be associated with certain conditions, including heart failure, aneurysm, and CAD.  Heart muscle function.  Heart valve function.  Signs of a past heart attack.  Fluid buildup around the heart.  Thickening of the heart muscle.  A tumor or infectious growth around the heart valves. Tell a health care provider about:  Any allergies you have.  All medicines you are taking, including vitamins, herbs, eye drops, creams, and over-the-counter medicines.  Any blood disorders you have.  Any surgeries you have had.  Any medical conditions you have.  Whether you are pregnant or may be pregnant. What are the risks? Generally, this is a safe procedure. However, problems may occur, including:  Allergic reaction to dye (contrast) that may be used during the procedure. What happens before the procedure? No specific preparation is needed. You may eat and drink normally. What happens during the procedure?   An IV tube may be inserted into one of your veins.  You may receive contrast through this tube. A contrast is an injection that improves the quality of the pictures from your heart.  A gel will be applied to your chest.  A wand-like tool (transducer) will be moved over your chest. The gel will help to transmit the sound waves from the transducer.  The sound waves will harmlessly bounce off of your heart to allow the heart images to be captured in real-time motion. The images will be recorded on a computer. The procedure may vary among health care providers and hospitals. What happens after the procedure?  You may return to your normal,  everyday life, including diet, activities, and medicines, unless your health care provider tells you not to do that. Summary  An echocardiogram is a procedure that uses painless sound waves (ultrasound) to produce an image of the heart.  Images from an echocardiogram can provide important information about the size and shape of  your heart, heart muscle function, heart valve function, and fluid buildup around your heart.  You do not need to do anything to prepare before this procedure. You may eat and drink normally.  After the echocardiogram is completed, you may return to your normal, everyday life, unless your health care provider tells you not to do that. This information is not intended to replace advice given to you by your health care provider. Make sure you discuss any questions you have with your health care provider. Document Released: 10/01/2000 Document Revised: 11/06/2016 Document Reviewed: 11/06/2016 Elsevier Interactive Patient Education  2019 [employer].

## 2019-02-27 NOTE — Progress Notes (Signed)
Virtual Visit via Video Note   This visit type was conducted due to national recommendations for restrictions regarding the COVID-19 Pandemic (e.g. social distancing) in an effort to limit this patient's exposure and mitigate transmission in our community.  Due to his co-morbid illnesses, this patient is at least at moderate risk for complications without adequate follow up.  This format is felt to be most appropriate for this patient at this time.  All issues noted in this document were discussed and addressed.  A limited physical exam was performed with this format.  Please refer to the patient's chart for his consent to telehealth for [facility].   Date:  02/27/2019   ID:  Devon Mathews., DOB July 28, 1951, MRN [id number]  Patient Location: Home Provider Location: Office  PCP:  Haywood Pao, MD  Cardiologist:  Skeet Latch, MD  Electrophysiologist:  None   Evaluation Performed:  Follow-Up Visit  Chief Complaint:  Follow up  History of Present Illness:    Devon Mathews is a 68 y.o. male with CAD s/p NSTEMI and PCI, paroxysmal atrial fibrillation, procedural pericardial effusion/tamponade s/p pericardial window, mild AR, hypertension, hyperlipidemia, OSA and obestiy who presents for follow up.  Devon Mathews was previously seen by Dr. Rollene Fare and by Dr. Einar Gip.  He last saw Dr. Einar Gip 01/2015 and reported mild, chronic dyspnea. He underwent cardiac catheterization in 2003 and 2006 with a 40% LAD lesion noted.  He had an ETT in 2007 that was negative for ischemia and a nuclear stress in 2011 and 2012 that were negative.  He had an echo 07/02/13 that revealed LVEF 55-60% and mild mitral regurgitation. He was seen in clinic 07/2016 and reported several months of chest pain and exertional shortness of breath.  He was referred for exercise Myoview 08/13/16 that revealed LVEF 63% and no ischemia.  He also had an echo 08/16/16 with LVEF 60-65%, mild LVH, mild AR and mild TR.  Devon Mathews had a sleep  study in 2014 that demonstrated moderate OSA. He continues to be compliant with CPAP.    Since his last appointment Devon Mathews had an NSTEMI 08/2018.  Troponin peaked at 0.15.  He underwent LHC and had a 95% mid LAD lesion that was successfully stented with a DES.  20% distal left main disease was also noted.  LVEF was 65% on left ventriculogram.  Simvastatin was switched to atorvastatin and metoprolol was increased to 50 mg daily.  Plans were made to continue dual antiplatelet therapy for 1 to 3 months followed by ticagrelor alone.  Since his last appointment Devon Mathews, Devon Mathews and reported angina.  He was referred for [facility] 12/01/2018 where there was concern for reversible ischemia in the anterolateral region and infarct with peri-infarct ischemia in the inferolateral and inferoseptal regions.  He subsequently underwent left heart catheterization 12/05/2018 that revealed a 90% ostial D1 lesion.  He underwent successful balloon angioplasty.  However the procedure was complicated by a diagonal wire perforation with subsequent development of a pericardial effusion and tamponade.  He developed hemorrhagic pericarditis and was started on colchicine.  He was discharged but developed A worsening effusion and early tamponade was noted on his echo 12/2018.  He was admitted and underwent pericardial window.  During that time he also developed atrial fibrillation and had a TIA.  Since being discharged Devon Mathews has felt well.  His only complaint is that he is not sleeping well.  He also has diarrhea from colchicine.  He has been walking and lost 40lb.  He has no chest pain or shortness of breath.  He feels better than he has in years.  He lost his job 2/2 COVID-19 and his wife has been furloughed.  However they are doing OK financially. He was recently released by Dr. [name] and is able to lift >9lb now.  He checks his BP regularly and it has been well-controlled.   The patient does not have symptoms  concerning for COVID-19 infection (fever, chills, cough, or new shortness of breath).    Past Medical History:  Diagnosis Date   Atrial fibrillation ([place])    CAD S/P percutaneous coronary angioplasty 09/13/2018    99% p-mLAD (BTW d1&d2) -> SYNERGY DES 3.5 X 12 (3.75 mm). 20% LM.  Cx, small RI & co-dom RCA normal.  EF 65%.    Dizziness 2012   normal findings..   Essential hypertension 07/28/2016   Lower extremity edema 07/28/2016   NSTEMI (non-ST elevated myocardial infarction) ([place]) 09/13/2018   The patient presented 09/13/2018 with a non-ST elevation MI, troponin peak was 0.15   Obesity    Obesity (BMI 30-39.9) 07/28/2016   OSA (obstructive sleep apnea) 07/28/2016   Restless leg syndrome    mild   Shortness of breath 07/28/2016   Past Surgical History:  Procedure Laterality Date   CORONARY BALLOON ANGIOPLASTY N/A 12/05/2018   Procedure: CORONARY BALLOON ANGIOPLASTY;  Surgeon: [name], MD;  Location: [place] [facility];  Service: Cardiovascular;  Laterality: N/A;   CORONARY STENT INTERVENTION N/A 09/13/2018   Procedure: CORONARY STENT INTERVENTION;  Surgeon: Belva Crome, MD;  Location: [place] [facility];  Service: Cardiovascular;; p-m LAD (btw D1&D2) - SYNERGY DES 3.5 x 12 (3.75 mm).   KNEE ARTHROSCOPY Right    LEFT HEART CATH AND CORONARY ANGIOGRAPHY N/A 09/13/2018   Procedure: LEFT HEART CATH AND CORONARY ANGIOGRAPHY;  Surgeon: Belva Crome, MD;  Location: [place] [facility];  Service: Cardiovascular;;  99% p-mLAD (BTW d1&d2) -> SYNERGY DES 3.5 X 12 (3.75 mm). 20% LM.  Cx, small RI & co-dom RCA normal.  EF 65%.    LEFT HEART CATH AND CORONARY ANGIOGRAPHY  2003   30-40% proximal segmental stenosis in the LAD   LEFT HEART CATH AND CORONARY ANGIOGRAPHY  2006   EF >50% & no mitral regurgitation   LEFT HEART CATH AND CORONARY ANGIOGRAPHY N/A 12/05/2018   Procedure: LEFT HEART CATH AND CORONARY ANGIOGRAPHY;  Surgeon: [name], MD;  Location: [place] [facility];  Service: Cardiovascular;  Laterality: N/A;   NM MYOVIEW LTD  07/2016   Normal.  EF 55-65% 63%).  No ischemia or infarction.  LOW RISK   NM MYOVIEW LTD  11/2018   6: 34 min.  7.2 METS.  Noted chest pain and tightness.  Horizontal ST of T wave depression (2 mm) II, 3, V5 and V6 (HIGH RISK), large/severe defect basal-apical inferior, inferolateral wall.  Large-severe basal-apical anterior-anterolateral wall.  Medium/moderate defect in basal and mid inferoseptal wall.  Consistent with large prior infarct with peri-infarct ischemia.  HIGH RISK   PERICARDIOCENTESIS N/A 12/05/2018   Procedure: PERICARDIOCENTESIS;  Surgeon: [name], MD;  Location: [place] [facility];  Service: Cardiovascular;  Laterality: N/A;   SUBXYPHOID PERICARDIAL WINDOW N/A 12/25/2018   Procedure: SUBXYPHOID PERICARDIAL WINDOW;  Surgeon: [name], MD;  Location: [facility];  Service: Thoracic;  Laterality: N/A;   TEE WITHOUT CARDIOVERSION N/A 12/25/2018   Procedure: TRANSESOPHAGEAL ECHOCARDIOGRAM (  TEE);  Surgeon: [name], MD;  [facility];  Service: Thoracic;  Laterality: N/A;   TRANSTHORACIC ECHOCARDIOGRAM  07/2016   EF 60-65% with mild LVH.  Mild AI.  Trivial MR and TR.     Current Meds  Medication Sig   Acetaminophen (TYLENOL PO) Take 2 tablets by mouth as needed (help releive pressure in his chest).   amitriptyline (ELAVIL) 25 MG tablet Take 25 mg by mouth at bedtime as needed for sleep.   atorvastatin (LIPITOR) 80 MG tablet Take 1 tablet (80 mg total) by mouth daily at 6 PM.   clopidogrel (PLAVIX) 75 MG tablet Take 1 tablet (75 mg total) by mouth daily.   colchicine 0.6 MG tablet Take 1 tablet (0.6 mg total) by mouth 2 (two) times daily.   ELIQUIS 5 MG TABS tablet Take 5 mg by mouth 2 (two) times daily.   furosemide (LASIX) 20 MG tablet Take 20-40 mg by mouth See admin instructions. Take 40 mg Bid 12/20/18 and 12/21/18. Starting 12/22/18 daily   methocarbamol (ROBAXIN) 500  MG tablet Take 500 mg by mouth 3 (three) times daily as needed for muscle spasms.   metoprolol succinate (TOPROL-XL) 50 MG 24 hr tablet Take 1 tablet (50 mg total) by mouth daily. Take with or immediately following a meal.   nitroGLYCERIN (NITROSTAT) 0.4 MG SL tablet Place 1 tablet (0.4 mg total) under the tongue every 5 (five) minutes x 3 doses as needed for chest pain.   pantoprazole (PROTONIX) 40 MG tablet TAKE ONE TABLET BY MOUTH ONE TIME DAILY. must make follow up appointment for further refills (Patient taking differently: Take 40 mg by mouth at bedtime. )   potassium chloride (K-DUR,KLOR-CON) 10 MEQ tablet Take 10 mEq by mouth daily.     Allergies:   Penicillins   Social History   Tobacco Use   Smoking status: Never Smoker   Smokeless tobacco: Never Used  Substance Use Topics   Alcohol use: No   Drug use: No     Family Hx: The patient's family history includes COPD in his father; Diabetes in his brother and sister; Heart attack in his paternal grandmother and sister; Heart disease in his father; Heart failure in his father, mother, and sister; Other in his father and maternal grandfather.  ROS:   Please see the history of present illness.     All other systems reviewed and are negative.   Prior CV studies:   The following studies were reviewed today:  Lexiscan Myoview 12/01/18:  Nuclear stress EF: 54%.  The left ventricular ejection fraction is mildly decreased (45-54%).  Horizontal ST segment depression ST segment depression of 2 mm was noted during stress in the II, III, V5 and V6 leads, beginning at 8 minutes of stressST deviation beginning in recovery.  Defect 1: There is a large defect of severe severity present in the basal inferior, basal inferolateral, mid inferior, mid inferolateral, apical inferior, apical lateral and apex location.  Defect 2: There is a large defect of severe severity present in the basal anterior, basal anterolateral, mid anterior,  mid anterolateral, apical anterior and apex location.  Defect 3: There is a medium defect of moderate severity present in the basal inferoseptal and mid inferoseptal location.  Findings consistent with ischemia and prior myocardial infarction with peri-infarct ischemia.  This is a high risk study.   There is a medium size, severe perfusion defect in the inferior and inferolateral walls with stress that is partially reversible at rest,  returning to moderate. Base to apex. There is a absent, large perfusion defect in the anterior wall from base to apex. It is partially reversible at rest and returns to moderate at the base and mid ventricle and returns to mild at the apex. Anterolateral defect is severe, and with full reversibility. Medium size, moderate perfusion defect in the inferoseptum with partial reversibility, returning to mild at rest.   LHC 12/05/18: SUMMARY  Culprit lesion:   90% ostial 1st Diag (jailed) -successful scoring balloon angioplasty using 2.5 mm Wolverine scoring balloon  --Distal diagonal wire perforation with pericardial effusion and initial stages of pericardial tamponade --> successful occlusion with prolonged balloon inflations and administration of protamine  --Unsuccessful pericardiocentesis  Widely patent LAD stent  Otherwise stable coronary arteries.  Normal LVEF and EDP pre-PCI  Clear ST elevations for roughly 15 minutes, most likely type for MI.  Echo 12/06/18: IMPRESSIONS    1. The left ventricle has normal systolic function, with an ejection fraction of 55-60%. The cavity size was normal. Left ventricular diastolic parameters were normal.  2. Small pericardial effusion seen at the apex, lateral wall and anterior to RV.  3. The mitral valve is degenerative. Mild thickening of the mitral valve leaflet. Mild calcification of the mitral valve leaflet.  4. The aortic valve is tricuspid There is Moderate thickening of the aortic valve There is Mild  calcification of the aortic valveAortic valve regurgitation is trivial by color flow Doppler.  Echo 12/21/18:  IMPRESSIONS   1. The left ventricle has hyperdynamic systolic function, with an ejection fraction of >65%. Left ventricular diastology could not be evaluated due to nondiagnostic images.  2. The mitral valve is normal in structure.  3. Aortic valve regurgitation is mild by color flow Doppler.  4. Moderate pericardial effusion.  5. The pericardial effusion is circumferential.  6. There is inversion of the right ventricular wall, inversion of the right atrial wall and excessive respiratory variation in the mitral valve spectral Doppler velocities.  7. When compared to the prior study: Compared to study 11/2018, the pericardial effusion has incrased in size and now significant effusion present posterior as well as anteriorly. There is RA inversion and suggestive of subtle intermittent RV diastolic  collapse. There is some respiratory variation in the [facility] inlow pattern with respirations. The IVC is not well visualized. Concern for early tamponade.   Carotid Doppler 12/21/18: Minimal plaque bilaterally.  Labs/Other Tests and Data Reviewed:    EKG:  No ECG reviewed.  Recent Labs: 12/20/2018: ALT 33; B Natriuretic Peptide 209.7 12/26/2018: BUN 18; Creatinine, Ser 0.96; Hemoglobin 12.0; Platelets 211; Potassium 4.5; Sodium 136   Recent Lipid Panel Lab Results  Component Value Date/Time   CHOL 79 12/21/2018 05:28 AM   TRIG 54 12/21/2018 05:28 AM   HDL 26 (L) 12/21/2018 05:28 AM   CHOLHDL 3.0 12/21/2018 05:28 AM   LDLCALC 42 12/21/2018 05:28 AM    Wt Readings from Last 3 Encounters:  02/27/19 246 lb (111.6 kg)  01/10/19 251 lb (113.9 kg)  12/27/18 263 lb 4.8 oz (119.4 kg)     Objective:    BP 118/79    Pulse 69    Ht 5\' 10"  (1.778 m)    Wt 246 lb (111.6 kg)    BMI 35.30 kg/m  GENERAL: Well-appearing.  No acute distress. HEENT: Pupils equal round.  Oral mucosa  unremarkable NECK:  No jugular venous distention, no visible thyromegaly EXT:  No edema, no cyanosis no clubbing SKIN:  No  rashes no nodules NEURO:  Speech fluent.  Cranial nerves grossly intact.  Moves all 4 extremities freely PSYCH:  Cognitively intact, oriented to person place and time  ASSESSMENT & PLAN:    # CAD: # Hyperlipidemia: Devon Mathews had a NSTEMI 08/2018.  He underwent PCI of the LAD and then had balloon angioplasty of D1.  He has no angina and is doing well with exercise.  We will get him set up with cardiac rehab once it opens again.  Continue clopidogrel and Eliqus. Continue metoprolol and atorvastatin.  LDL was 42 on 12/2018.   # Pericardial effusion: # Tamponade: Doing well clinically s/p subxyphoid window.  There was a microperforation during balloon angioplasty.  Stop colchicine 03/08/19, which will be 60 days of therapy.  We will repeat his echo 03/2019.  # PAF:  Devon Mathews had atrial fibrillation that was likely 2/2 his effusion and tamponade. He is currently asymptomatic.  We will arrange for him to follow up in 1 month once COVID-19 restrictions are lifted.  If he remains in atrial fibrillation we will plan for DCCV.   # Hypertension: Blood pressure well controlled on lasix and metoprolol.  # OSA: Continue CPAP.  COVID-19 Education: The signs and symptoms of COVID-19 were discussed with the patient and how to seek care for testing (follow up with PCP or arrange E-visit).  The importance of social distancing was discussed today.  Time:   Today, I have spent 25 minutes with the patient with telehealth technology discussing the above problems.     Medication Adjustments/Labs and Tests Ordered: Current medicines are reviewed at length with the patient today.  Concerns regarding medicines are outlined above.   Tests Ordered: Orders Placed This Encounter  Procedures   ECHOCARDIOGRAM COMPLETE    Medication Changes: No orders of the defined types were placed in  this encounter.   Disposition:  Follow up in 1 month(s)  Signed, Skeet Latch, MD  02/27/2019 1:05 PM    [facility]

## 2019-03-05 ENCOUNTER — Telehealth: Payer: Self-pay | Admitting: Cardiovascular Disease

## 2019-03-05 NOTE — Telephone Encounter (Signed)
New message:    Patient calling stating that you called him. Please call patient. Did not see a note.

## 2019-03-07 ENCOUNTER — Other Ambulatory Visit: Payer: Self-pay | Admitting: Physician Assistant

## 2019-03-09 ENCOUNTER — Other Ambulatory Visit (HOSPITAL_COMMUNITY): Payer: Self-pay

## 2019-03-16 ENCOUNTER — Other Ambulatory Visit: Payer: Self-pay

## 2019-03-16 MED ORDER — COLCHICINE 0.6 MG PO TABS: 0.6000 mg | ORAL_TABLET | Freq: Two times a day (BID) | ORAL | 3 refills | Status: DC

## 2019-03-27 ENCOUNTER — Other Ambulatory Visit: Payer: Self-pay | Admitting: Orthopedic Surgery

## 2019-03-27 DIAGNOSIS — G8929 Other chronic pain: Secondary | ICD-10-CM

## 2019-03-29 ENCOUNTER — Telehealth: Payer: Self-pay | Admitting: Cardiovascular Disease

## 2019-03-29 ENCOUNTER — Telehealth: Payer: Self-pay

## 2019-03-29 NOTE — Telephone Encounter (Signed)
New Message:    Mardene Celeste from [facility] calling stating the patient is concerned about stopping his Eliquis, and he states that he is afraid, because the last time he had a Electrical engineer office will fax a medical clearance over. They would like to speak with a nurse.

## 2019-03-29 NOTE — Telephone Encounter (Signed)
   [facility] Pre-operative Risk Assessment    Request for surgical clearance:  1. What type of surgery is being performed? CT ARTHROGRAM RIGHT SHOULDER   2. When is this surgery scheduled? TBD   3. What type of clearance is required (medical clearance vs. Pharmacy clearance to hold med vs. Both)? MEDICATION   4. Are there any medications that need to be held prior to surgery and how long? ELIQUIS-48 HOURS   5. Practice name and name of physician performing surgery? [facility]    6. What is your office phone number [phone]    7.   What is your office fax number [phone]  8.   Anesthesia type (None, local, MAC, general) ? NOT LISTED   Devon Mathews 03/29/2019, 4:42 PM  _________________________________________________________________   (provider comments below)

## 2019-03-29 NOTE — Telephone Encounter (Signed)
Spoke with Mardene Celeste from [facility]. She report pt is scheduled to have a CT arthrogram of the right shoulder but pt is very concerned about stopping his Eliquis. She report pt state last time he stopped it, he had a stroke. Informed Mardene Celeste to fax over surgical clearance form and our pre-op will review his chart. Mardene Celeste voiced understanding.

## 2019-03-30 ENCOUNTER — Telehealth: Payer: Self-pay | Admitting: Cardiology

## 2019-03-30 MED ORDER — CLOPIDOGREL BISULFATE 75 MG PO TABS: 75.0000 mg | ORAL_TABLET | Freq: Every day | ORAL | 1 refills | Status: DC

## 2019-03-30 NOTE — Telephone Encounter (Signed)
Refilled as requested  

## 2019-03-30 NOTE — Telephone Encounter (Signed)
New Message           Patient is trying to get a refill on "Clopidogrel but it has Dr.Rodenberry on it and the patient has never and he needs a refill called in to [facility] [address]

## 2019-04-02 ENCOUNTER — Telehealth (HOSPITAL_COMMUNITY): Payer: Self-pay | Admitting: *Deleted

## 2019-04-02 NOTE — Telephone Encounter (Signed)
  Dr.  Oval [name]   As you are aware our department remains closed to patients due to Covid-19.  We are excited to be able to offer an alternative to traditional onsite Cardiac Rehab while your patient continues to follow Re-Open guidelines.  This is a notification that your patient has been contacted and is very interested in participating in Virtual Cardiac Rehab.  Thank you for your continued support in helping [place] meet the health care needs of our patients.  Barnet Pall, RN,BSN 04/02/2019 3:47 PM  Cardiac Rehab staff

## 2019-04-02 NOTE — Telephone Encounter (Signed)
         Confirm Consent - In the setting of the current Covid19 crisis, you are scheduled for a phone visit with your Cardiac or Pulmonary team member.  Just as we do with many in-gym visits, in order for you to participate in this visit, we must obtain consent.  If you'd like, I can send this to your mychart (if signed up) or email for you to review.  Otherwise, I can obtain your verbal consent now.  By agreeing to a telephone visit, we'd like you to understand that the technology does not allow for your Cardiac or Pulmonary Rehab team member to perform a physical assessment, and thus may limit their ability to fully assess your ability to perform exercise programs. If your provider identifies any concerns that need to be evaluated in person, we will make arrangements to do so.  Finally, though the technology is pretty good, we cannot assure that it will always work on either your or our end and we cannot ensure that we have a secure connection.  Cardiac and Pulmonary Rehab Telehealth visits and "At Home" cardiac and pulmonary rehab are provided at no cost to you.        Are you willing to proceed?"        STAFF: Did the patient verbally acknowledge consent to telehealth visit? Document YES/NO here: Yes   Pt is interested in participating in Virtual Cardiac Rehab. Pt advised that Virtual Cardiac Rehab is provided at no cost to the patient.  Checklist:  1. Pt has smart device  ie smartphone and/or ipad for downloading an app  Yes 2. Reliable internet/wifi service    Yes 3. Understands how to use their smartphone and navigate within an app.  Yes   Reviewed with pt the scheduling process for virtual cardiac rehab.  Pt verbalized understanding.     Barnet Pall RN  Cardiac  and Pulmonary Rehab Staff        June 15, 3:40 pm

## 2019-04-03 ENCOUNTER — Telehealth (HOSPITAL_COMMUNITY): Payer: Self-pay

## 2019-04-03 ENCOUNTER — Encounter (HOSPITAL_COMMUNITY)
Admission: RE | Admit: 2019-04-03 | Discharge: 2019-04-03 | Disposition: A | Discharge status: Home / Self Care | Payer: PRIVATE HEALTH INSURANCE | Source: Ambulatory Visit | Attending: Cardiovascular Disease | Admitting: Cardiovascular Disease

## 2019-04-03 ENCOUNTER — Telehealth (HOSPITAL_COMMUNITY): Payer: Self-pay | Admitting: *Deleted

## 2019-04-03 ENCOUNTER — Encounter (HOSPITAL_COMMUNITY): Payer: Self-pay

## 2019-04-03 NOTE — Telephone Encounter (Signed)
Called pt to discuss heart healthy eating while participating in virtual cardiac rehab. Left message requesting pt call RD back, distributed contact information.   Laurina Bustle MS RD LDN 04/03/19  1:52 pm

## 2019-04-03 NOTE — Telephone Encounter (Signed)
Called and spoke to pt regarding Virtual Cardiac Rehab.  Pt  was able to download the [employer] app on their smart device with no issues. Pt set up their account and received the following welcome message -"Welcome to the [facility] and Pulmonary Rehabilitation program. We hope that you will find the exercise program beneficial in your recovery process. Our staff is available to assist with any questions/concerns about your exercise routine. Best wishes". Brief orientation provided to with the advisement to watch the "Intro to Rehab" series located under the Resource tab. Pt verbalized understanding. Will continue to follow and monitor pt progress with feedback as needed. Pt not interested in speaking with dietician.   Carma Lair MS, ACSM CEP 4:36 PM 04/03/2019

## 2019-04-04 NOTE — Telephone Encounter (Signed)
[facility] patricia [phone] for questions

## 2019-04-05 IMAGING — DX PORTABLE CHEST - 1 VIEW
1 series · 1 of 1 positions shown · non-contrast
Comparison: 12/20/2018

CLINICAL DATA: Status post pericardial window

EXAM:
PORTABLE CHEST 1 VIEW

[chest]
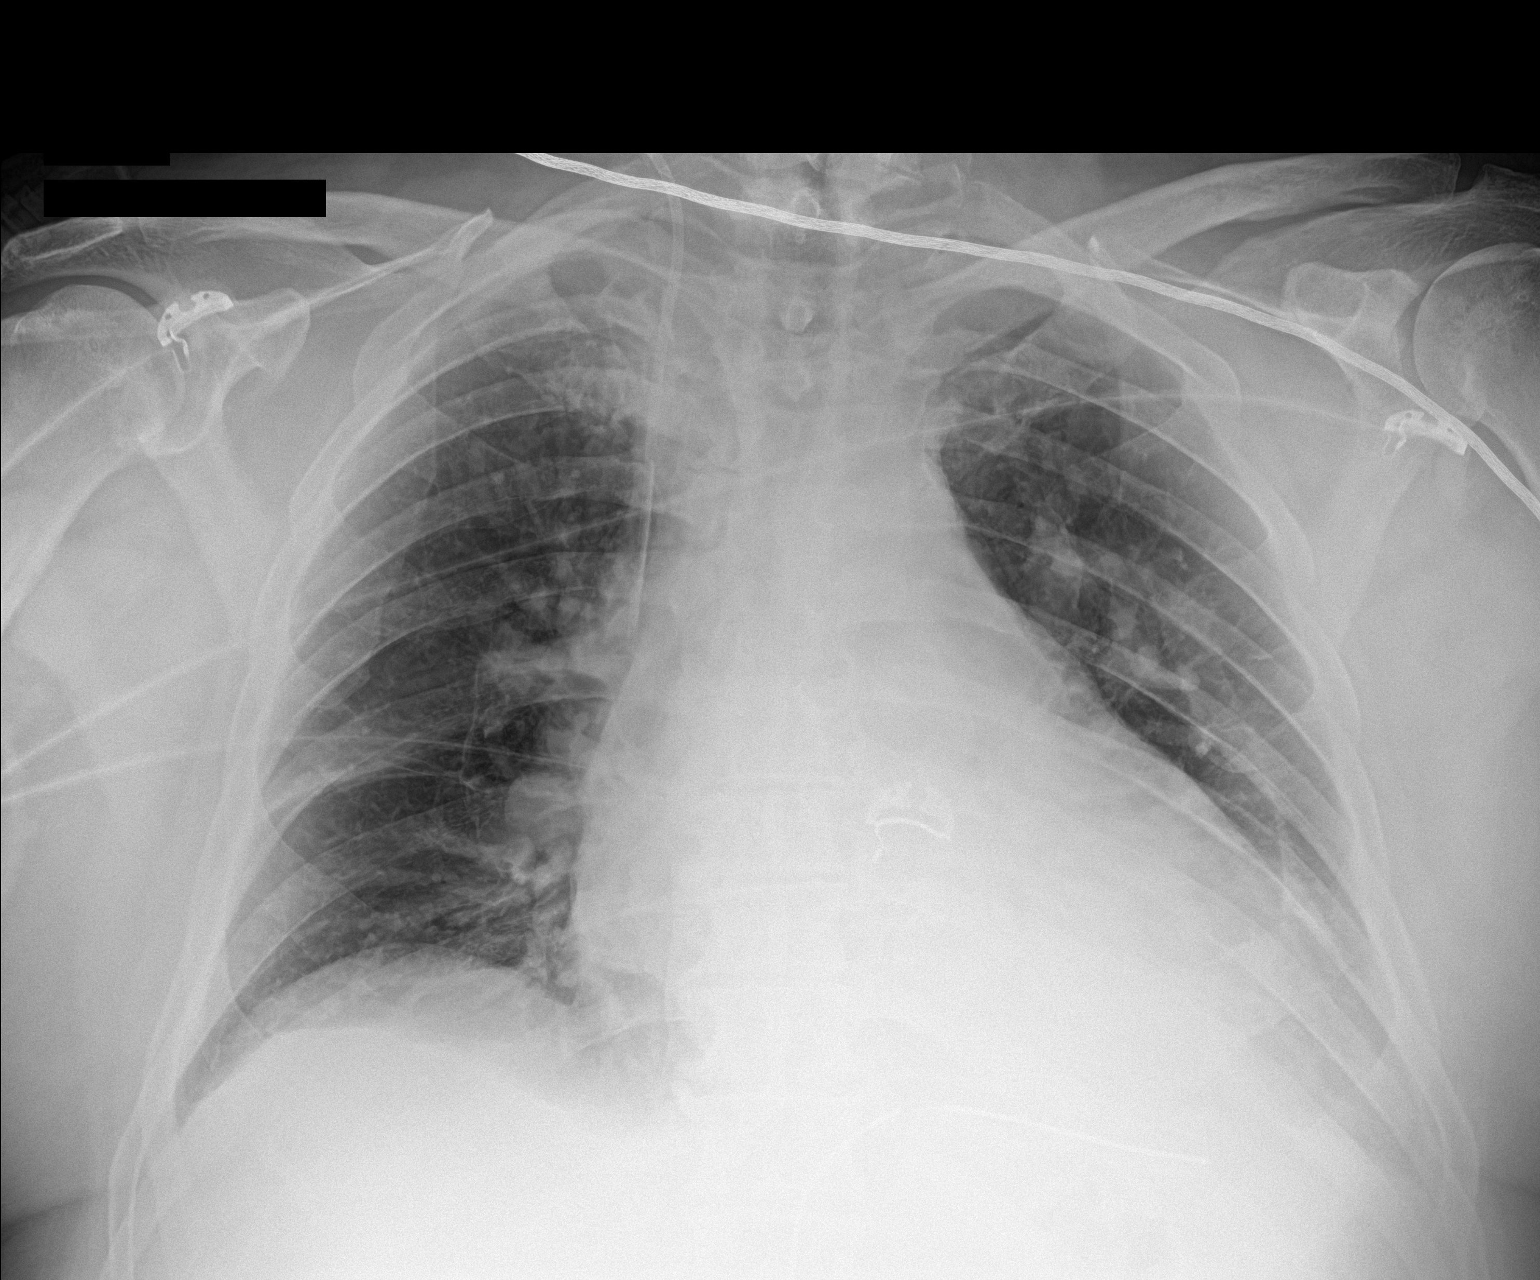

[1 of 1 positions shown; findings below may reference images not displayed]

FINDINGS: Cardiac shadow has decreased somewhat in the interval from the prior
exam. Pericardial drain is noted. Right jugular central line is seen
in the distal superior vena cava. No pneumothorax or focal
infiltrate is seen.
IMPRESSION: Tubes and lines as described.

No acute abnormality noted.

## 2019-04-06 ENCOUNTER — Telehealth (HOSPITAL_COMMUNITY): Payer: Self-pay | Admitting: Radiology

## 2019-04-06 NOTE — Telephone Encounter (Signed)
Left message with instructions for echocardiogram.

## 2019-04-09 ENCOUNTER — Ambulatory Visit (HOSPITAL_COMMUNITY): Payer: 59 | Attending: Cardiovascular Disease

## 2019-04-09 ENCOUNTER — Other Ambulatory Visit: Payer: Self-pay

## 2019-04-09 DIAGNOSIS — I313 Pericardial effusion (noninflammatory): Secondary | ICD-10-CM | POA: Insufficient documentation

## 2019-04-09 DIAGNOSIS — I2510 Atherosclerotic heart disease of native coronary artery without angina pectoris: Secondary | ICD-10-CM | POA: Diagnosis not present

## 2019-04-09 DIAGNOSIS — Z9861 Coronary angioplasty status: Secondary | ICD-10-CM | POA: Insufficient documentation

## 2019-04-09 DIAGNOSIS — I4891 Unspecified atrial fibrillation: Secondary | ICD-10-CM | POA: Insufficient documentation

## 2019-04-09 DIAGNOSIS — I3139 Other pericardial effusion (noninflammatory): Secondary | ICD-10-CM

## 2019-04-16 ENCOUNTER — Ambulatory Visit: Payer: Self-pay | Admitting: Cardiology

## 2019-04-17 ENCOUNTER — Encounter (HOSPITAL_COMMUNITY): Payer: Self-pay | Admitting: *Deleted

## 2019-04-17 NOTE — Progress Notes (Signed)
Pt inactive in [employer] App.  Letter mailed requesting patient to return call regarding this by 05/01/2019. If no response, will discharge from cardiac rehab program.  

## 2019-04-21 IMAGING — CR CHEST - 2 VIEW
2 series · 2 of 2 positions shown · non-contrast
Comparison: 12/25/2018

CLINICAL DATA: Status post pericardial window

EXAM:
CHEST - 2 VIEW

[w chest pa]
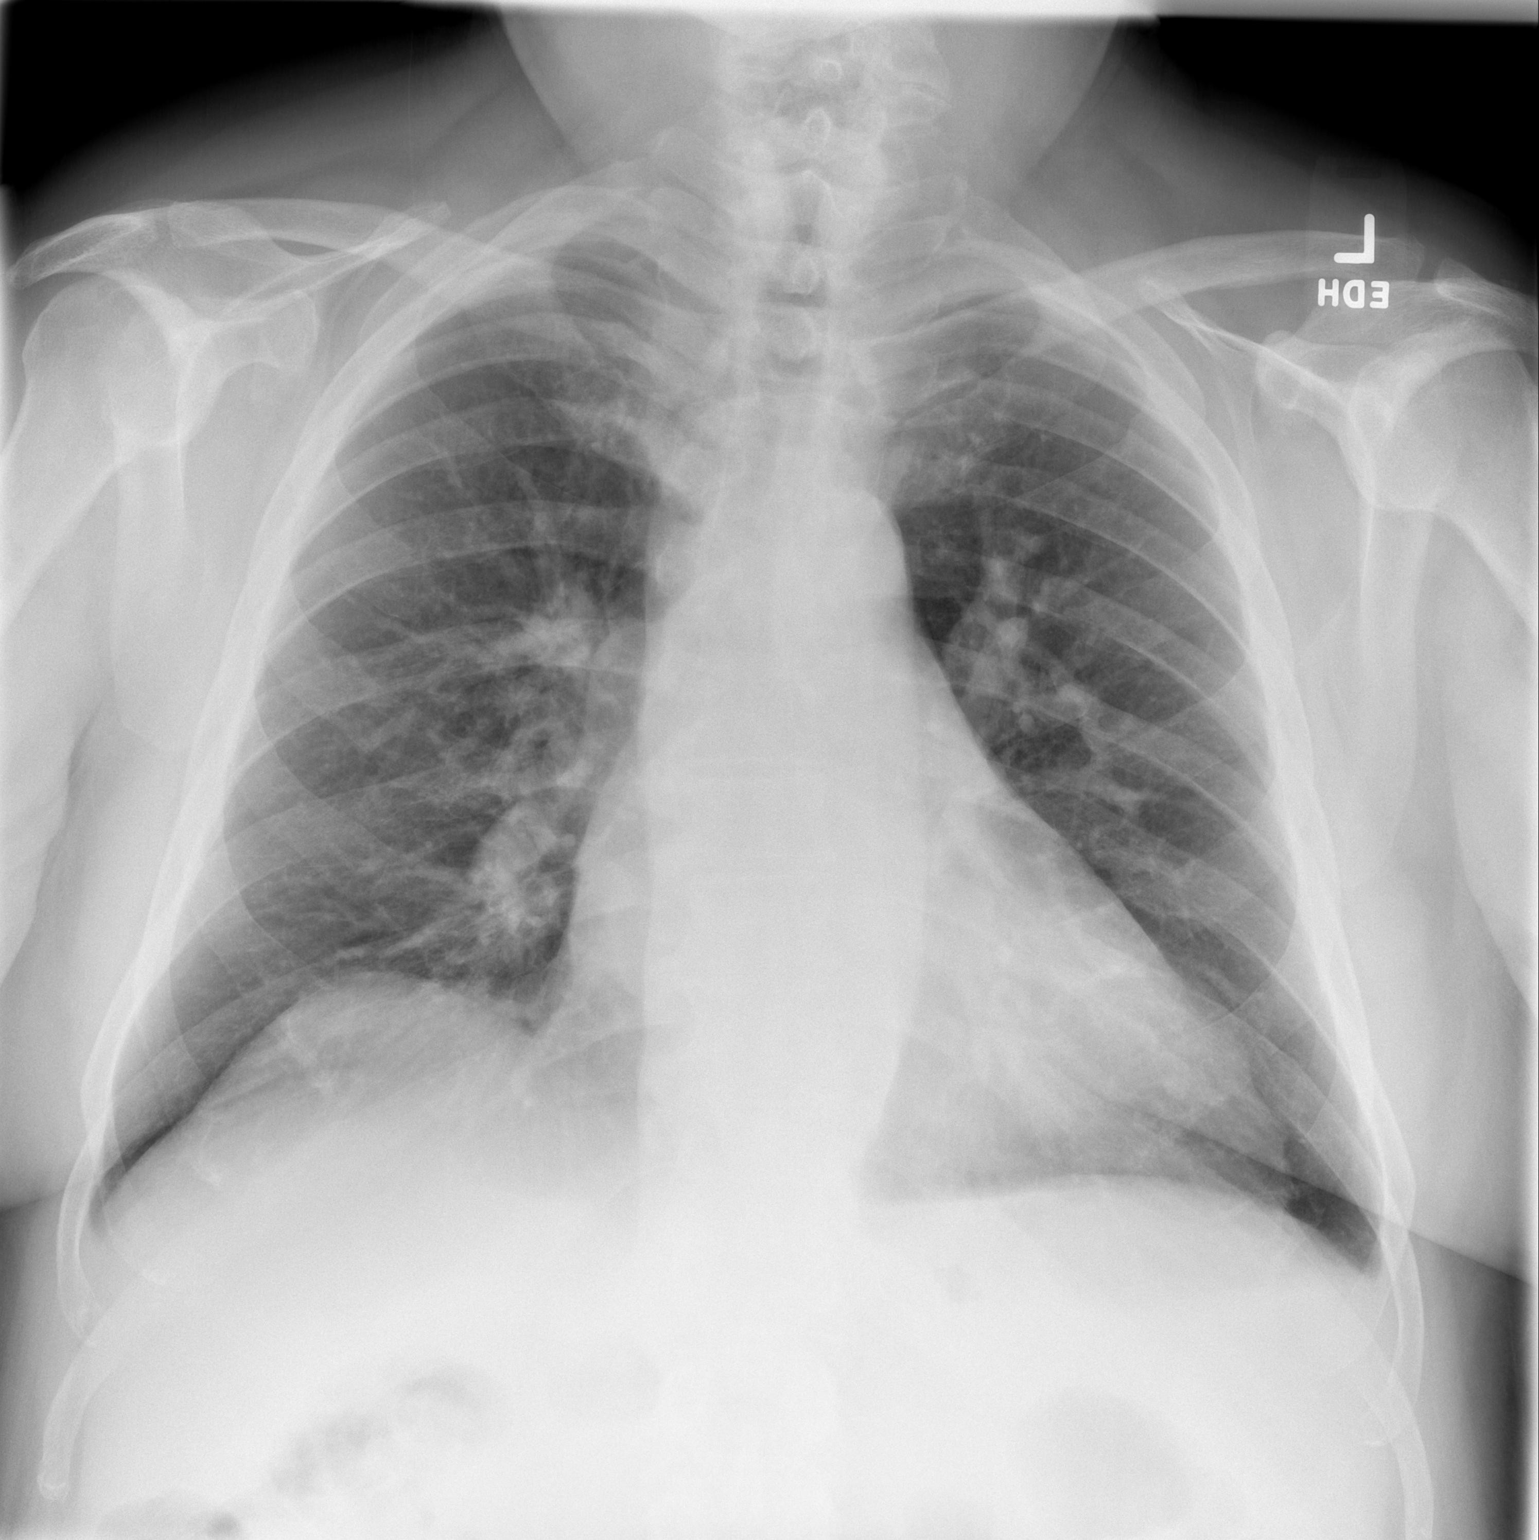

[w chest lat]
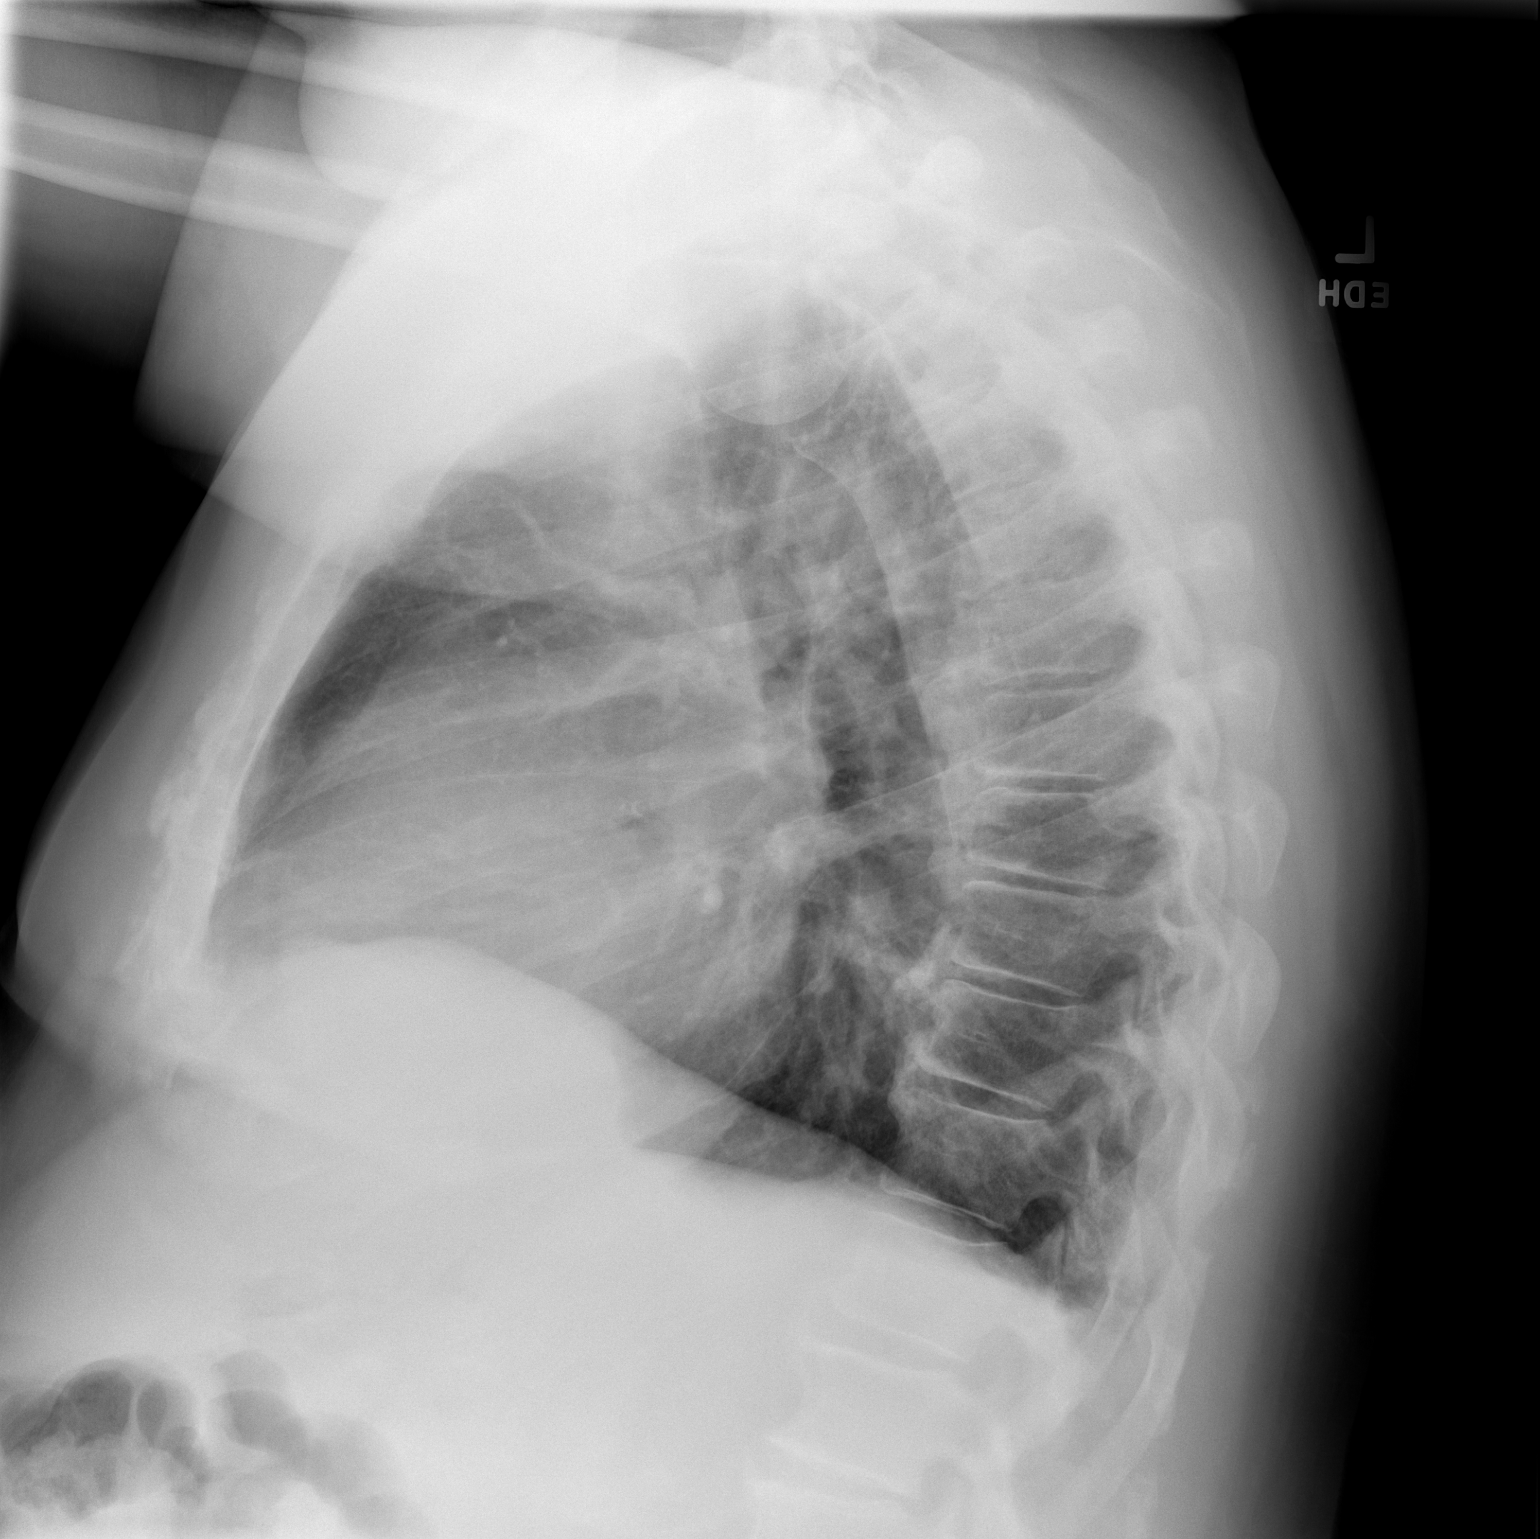

[2 of 2 positions shown; findings below may reference images not displayed]

FINDINGS: Cardiac shadow is stable. The lungs are well aerated bilaterally. No
focal infiltrate or sizable effusion is seen. No acute bony
abnormality is noted.
IMPRESSION: No acute abnormality seen.

## 2019-04-25 ENCOUNTER — Telehealth: Payer: Self-pay | Admitting: Cardiology

## 2019-04-25 NOTE — Telephone Encounter (Signed)

## 2019-04-26 ENCOUNTER — Other Ambulatory Visit: Payer: Self-pay

## 2019-04-26 ENCOUNTER — Ambulatory Visit (INDEPENDENT_AMBULATORY_CARE_PROVIDER_SITE_OTHER): Payer: 59 | Admitting: Cardiology

## 2019-04-26 ENCOUNTER — Encounter: Payer: Self-pay | Admitting: Cardiology

## 2019-04-26 VITALS — BP 124/68 | HR 64 | Ht 70.0 in | Wt 255.2 lb

## 2019-04-26 DIAGNOSIS — G4733 Obstructive sleep apnea (adult) (pediatric): Secondary | ICD-10-CM

## 2019-04-26 DIAGNOSIS — I480 Paroxysmal atrial fibrillation: Secondary | ICD-10-CM

## 2019-04-26 DIAGNOSIS — I252 Old myocardial infarction: Secondary | ICD-10-CM | POA: Diagnosis not present

## 2019-04-26 DIAGNOSIS — I10 Essential (primary) hypertension: Secondary | ICD-10-CM | POA: Diagnosis not present

## 2019-04-26 DIAGNOSIS — Z9861 Coronary angioplasty status: Secondary | ICD-10-CM

## 2019-04-26 DIAGNOSIS — E785 Hyperlipidemia, unspecified: Secondary | ICD-10-CM | POA: Diagnosis not present

## 2019-04-26 DIAGNOSIS — I2510 Atherosclerotic heart disease of native coronary artery without angina pectoris: Secondary | ICD-10-CM

## 2019-04-26 DIAGNOSIS — Z7901 Long term (current) use of anticoagulants: Secondary | ICD-10-CM | POA: Insufficient documentation

## 2019-04-26 DIAGNOSIS — G459 Transient cerebral ischemic attack, unspecified: Secondary | ICD-10-CM

## 2019-04-26 DIAGNOSIS — E669 Obesity, unspecified: Secondary | ICD-10-CM

## 2019-04-26 DIAGNOSIS — I3139 Other pericardial effusion (noninflammatory): Secondary | ICD-10-CM

## 2019-04-26 DIAGNOSIS — I313 Pericardial effusion (noninflammatory): Secondary | ICD-10-CM

## 2019-04-26 NOTE — Assessment & Plan Note (Signed)
On presentation March 2020

## 2019-04-26 NOTE — Progress Notes (Signed)
Cardiology Office Note:    Date:  04/26/2019   ID:  Devon Mathews., DOB January 07, 1951, MRN [id number]  PCP:  Haywood Pao, MD  Cardiologist:  Skeet Latch, MD  Electrophysiologist:  None   Referring MD: Haywood Pao, MD   Chief Complaint  Patient presents with  . 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  His father, Devon Mathews, was a longtime patient of Dr. Lowella Fairy. The patient-Devon Mathews a heart catheterization in 2003 that showed a 40% LAD lesion. Relook cath in 2006again showed a 40% LAD lesion. He had several Myoview's following this, the last one was in 2017, they were all low risk. Echocardiogram in October 2017 showed good LV function with mild LVH. Other medical problems include a family history of coronary disease, HTN,obesity,dyslipidemia,andsleep apneaon C-pap  The patient presented to [facility] 09/13[facility]2018 with chest pain consistent with unstable angina. He ruled in for a non-STEMI with a troponin peak of 0.15. He underwent diagnostic catheterization and LAD stenting on 09/14/2018. He had 20% left main narrowing but no other significant disease. His LV function was preserved.   He developed chest discomfort as an OP concerning for angina.  Myoview was done 12/01/2018 and was abnormal.  He was admitted for diagnostic cath 12/05/2018.  This revealed a 95% jailed Dx1, the previously placed LAD stent was patent.  The pt underwent PCI to the Dx that was complicated by vessel perforation and pericardial effusion. He was discharged with plans for medical Rx but ended up in AF with CHF and a TIA 12/20/2018.  He underwent pericardial window 12/25/2018.  Since being discharged Mr. Milliman has felt well.  His only complaint is that he is not sleeping well.  Colchicine stopped in May.  He has no chest pain or shortness of breath.  He lost his job 2/2 COVID-19 and his wife has been furloughed.  However  they are doing OK financially.  He admits he has gained weight since he stopped working.   Past Medical History:  Diagnosis Date  . Atrial fibrillation ([place])   . CAD S/P percutaneous coronary angioplasty 09/13/2018    99% p-mLAD (BTW d1&d2) -> SYNERGY DES 3.5 X 12 (3.75 mm). 20% LM.  Cx, small RI & co-dom RCA normal.  EF 65%.   . Dizziness 2012   normal findings..  . Essential hypertension 07/28/2016  . Lower extremity edema 07/28/2016  . NSTEMI (non-ST elevated myocardial infarction) ([place]) 09/13/2018   The patient presented 09/13/2018 with a non-ST elevation MI, troponin peak was 0.15  . Obesity   . Obesity (BMI 30-39.9) 07/28/2016  . OSA (obstructive sleep apnea) 07/28/2016  . Restless leg syndrome    mild  . Shortness of breath 07/28/2016    Past Surgical History:  Procedure Laterality Date  . CORONARY BALLOON ANGIOPLASTY N/A 12/05/2018   Procedure: CORONARY BALLOON ANGIOPLASTY;  Surgeon: [name], MD;  Location: [place] [facility];  Service: Cardiovascular;  Laterality: N/A;  . CORONARY STENT INTERVENTION N/A 09/13/2018   Procedure: CORONARY STENT INTERVENTION;  Surgeon: Belva Crome, MD;  Location: [place] [facility];  Service: Cardiovascular;; p-m LAD (btw D1&D2) - SYNERGY DES 3.5 x 12 (3.75 mm).  Marland Kitchen KNEE ARTHROSCOPY Right   . LEFT HEART CATH AND CORONARY ANGIOGRAPHY N/A 09/13/2018   Procedure: LEFT HEART CATH AND CORONARY ANGIOGRAPHY;  Surgeon: Belva Crome,  MD;  Location: [place] [facility];  Service: Cardiovascular;;  99% p-mLAD (BTW d1&d2) -> SYNERGY DES 3.5 X 12 (3.75 mm). 20% LM.  Cx, small RI & co-dom RCA normal.  EF 65%.   Marland Kitchen LEFT HEART CATH AND CORONARY ANGIOGRAPHY  2003   30-40% proximal segmental stenosis in the LAD  . LEFT HEART CATH AND CORONARY ANGIOGRAPHY  2006   EF >50% & no mitral regurgitation  . LEFT HEART CATH AND CORONARY ANGIOGRAPHY N/A 12/05/2018   Procedure: LEFT HEART CATH AND CORONARY ANGIOGRAPHY;  Surgeon: [name], MD;  Location: [place] [facility];  Service: Cardiovascular;  Laterality: N/A;  . NM MYOVIEW LTD  07/2016   Normal.  EF 55-65% 63%).  No ischemia or infarction.  LOW RISK  . NM MYOVIEW LTD  11/2018   6: 34 min.  7.2 METS.  Noted chest pain and tightness.  Horizontal ST of T wave depression (2 mm) II, 3, V5 and V6 (HIGH RISK), large/severe defect basal-apical inferior, inferolateral wall.  Large-severe basal-apical anterior-anterolateral wall.  Medium/moderate defect in basal and mid inferoseptal wall.  Consistent with large prior infarct with peri-infarct ischemia.  HIGH RISK  . PERICARDIOCENTESIS N/A 12/05/2018   Procedure: PERICARDIOCENTESIS;  Surgeon: [name], MD;  Location: [place] [facility];  Service: Cardiovascular;  Laterality: N/A;  . SUBXYPHOID PERICARDIAL WINDOW N/A 12/25/2018   Procedure: SUBXYPHOID PERICARDIAL WINDOW;  Surgeon: [name], MD;  Location: [place];  Service: Thoracic;  Laterality: N/A;  . TEE WITHOUT CARDIOVERSION N/A 12/25/2018   Procedure: TRANSESOPHAGEAL ECHOCARDIOGRAM (TEE);  Surgeon: [name], MD;  Location: [facility] OR;  Service: Thoracic;  Laterality: N/A;  . TRANSTHORACIC ECHOCARDIOGRAM  07/2016   EF 60-65% with mild LVH.  Mild AI.  Trivial MR and TR.    Current Medications: Current Meds  Medication Sig  . Acetaminophen (TYLENOL PO) Take 2 tablets by mouth as needed (help releive pressure in his chest).  Marland Kitchen amitriptyline (ELAVIL) 25 MG tablet Take 25 mg by mouth at bedtime as needed for sleep.  Marland Kitchen atorvastatin (LIPITOR) 80 MG tablet Take 1 tablet (80 mg total) by mouth daily at 6 PM.  . clopidogrel (PLAVIX) 75 MG tablet Take 1 tablet (75 mg total) by mouth daily.  Marland Kitchen ELIQUIS 5 MG TABS tablet Take 5 mg by mouth 2 (two) times daily.  . furosemide (LASIX) 20 MG tablet Take 20-40 mg by mouth See admin instructions. Take 40 mg Bid 12/20/18 and 12/21/18. Starting 12/22/18 daily  . methocarbamol (ROBAXIN) 500 MG tablet Take 500 mg by mouth 3 (three) times daily as needed for  muscle spasms.  . metoprolol succinate (TOPROL-XL) 50 MG 24 hr tablet Take 1 tablet (50 mg total) by mouth daily. Take with or immediately following a meal.  . nitroGLYCERIN (NITROSTAT) 0.4 MG SL tablet Place 1 tablet (0.4 mg total) under the tongue every 5 (five) minutes x 3 doses as needed for chest pain.  . pantoprazole (PROTONIX) 40 MG tablet TAKE ONE TABLET BY MOUTH ONE TIME DAILY. must make follow up appointment for further refills (Patient taking differently: Take 40 mg by mouth at bedtime. )  . potassium chloride (K-DUR,KLOR-CON) 10 MEQ tablet Take 10 mEq by mouth daily.     Allergies:   Penicillins   Social History   Socioeconomic History  . Marital status: Married    Spouse name: Not on file  . Number of children: Not on file  . Years of education: Not on  file  . Highest education level: Not on file  Occupational History  . Not on file  Social Needs  . Financial resource strain: Not on file  . Food insecurity    Worry: Not on file    Inability: Not on file  . Transportation needs    Medical: Not on file    Non-medical: Not on file  Tobacco Use  . Smoking status: Never Smoker  . Smokeless tobacco: Never Used  Substance and Sexual Activity  . Alcohol use: No  . Drug use: No  . Sexual activity: Not on file  Lifestyle  . Physical activity    Days per week: Not on file    Minutes per session: Not on file  . Stress: Not on file  Relationships  . Social Herbalist on phone: Not on file    Gets together: Not on file    Attends religious service: Not on file    Active member of club or organization: Not on file    Attends meetings of clubs or organizations: Not on file    Relationship status: Not on file  Other Topics Concern  . Not on file  Social History Narrative  . Not on file     Family History: The patient's family history includes COPD in his father; Diabetes in his brother and sister; Heart attack in his paternal grandmother and sister; Heart  disease in his father; Heart failure in his father, mother, and sister; Other in his father and maternal grandfather.  ROS:   Please see the history of present illness.     All other systems reviewed and are negative.  EKGs/Labs/Other Studies Reviewed:    EKG:  EKG is ordered today.  The ekg ordered today demonstrates NSR, 64  Recent Labs: 12/20/2018: ALT 33; B Natriuretic Peptide 209.7 12/26/2018: BUN 18; Creatinine, Ser 0.96; Hemoglobin 12.0; Platelets 211; Potassium 4.5; Sodium 136  Recent Lipid Panel    Component Value Date/Time   CHOL 79 12/21/2018 0528   TRIG 54 12/21/2018 0528   HDL 26 (L) 12/21/2018 0528   CHOLHDL 3.0 12/21/2018 0528   VLDL 11 12/21/2018 0528   LDLCALC 42 12/21/2018 0528    Physical Exam:    VS:  BP 124/68   Pulse 64   Ht 5\' 10"  (1.778 m)   Wt 255 lb 3.2 oz (115.8 kg)   BMI 36.62 kg/m     Wt Readings from Last 3 Encounters:  04/26/19 255 lb 3.2 oz (115.8 kg)  02/27/19 246 lb (111.6 kg)  01/10/19 251 lb (113.9 kg)     GEN:  Obese Caucasian male, well developed in no acute distress HEENT: Normal NECK: No JVD; No carotid bruits LYMPHATICS: No lymphadenopathy CARDIAC: RRR, no murmurs, rubs, gallops RESPIRATORY:  Clear to auscultation without rales, wheezing or rhonchi  ABDOMEN: Soft, non-tender, non-distended- umbilical hernia noted MUSCULOSKELETAL:  No edema; No deformity  SKIN: Warm and dry NEUROLOGIC:  Alert and oriented x 3 PSYCHIATRIC:  Normal affect   ASSESSMENT:    Pericardial effusion S/P pericardial window 12/18/2018  TIA (transient ischemic attack) On presentation March 2020  PAF (paroxysmal atrial fibrillation) ([place]) On Presentation with increased pericardial effusion March 2020- NSR since then  Chronic anticoagulation Eliquis  CAD S/P percutaneous coronary angioplasty  99% p-mLAD treated with DES 09/14/2018 Chest pain as an OP -Myoview- then cath and PCI (POBA) to jailed Dx 12/05/2018. Procedure complicated by vessel  perforation, hemorrhagic tamponade, cardiogenic shock, and AKI.  Obesity (  BMI 30-39.9) BMI 36- truncal obesity  OSA (obstructive sleep apnea) Compliant with CPAP  PLAN:    Despite his reassurance that he is doing "OK" I think he has some not unexpected anxiety about his health. He wakes up at at night anxious something is going to happen.  I asked if he has discussed this with he PCP and he tells me he doesn't want anymore medications.  I told from a cardiac standpoint he is doing well and we have no reason to think that will change.    He will need a Rt shoulder arthrogram soon.  He has had 4 injections and his orthopedist is reluctant to give him another. He says he cannot tolerate an MRI.  He would have to hold his Eliquis for this and is concerned about that.  I told him I would ask Dr Oval [name] about holding the Eliquis for his arthrogram.    Medication Adjustments/Labs and Tests Ordered: Current medicines are reviewed at length with the patient today.  Concerns regarding medicines are outlined above.  Orders Placed This Encounter  Procedures  . EKG 12-Lead   No orders of the defined types were placed in this encounter.   Patient Instructions  Medication Instructions:  Your physician recommends that you continue on your current medications as directed. Please refer to the Current Medication list given to you today. If you need a refill on your cardiac medications before your next appointment, please call your pharmacy.   Lab work: None  If you have labs (blood work) drawn today and your tests are completely normal, you will receive your results only by: Marland Kitchen MyChart Message (if you have MyChart) OR . A paper copy in the mail If you have any lab test that is abnormal or we need to change your treatment, we will call you to review the results.  Testing/Procedures: None   Follow-Up: At [facility], you and your health needs are our priority.  As part of our continuing mission  to provide you with exceptional heart care, we have created designated Provider Care Teams.  These Care Teams include your primary Cardiologist (physician) and Advanced Practice Providers (APPs -  Physician Assistants and Nurse Practitioners) who all work together to provide you with the care you need, when you need it. You will need a follow up appointment in 6 months.  Please call our office 2 months in advance to schedule this appointment.  You may see Skeet Latch, MD or one of the following Advanced Practice Providers on your designated Care Team:   Kyvon Hu, PA-C Roby Lofts, [place] . Sande Rives, PA-C  Any Other Special Instructions Will Be Listed Below (If Applicable).      Jahrel, [place], PA-C  04/26/2019 4:57 PM    [facility]

## 2019-04-26 NOTE — Assessment & Plan Note (Signed)
On Presentation with increased pericardial effusion March 2020- NSR since then

## 2019-04-26 NOTE — Patient Instructions (Signed)
Medication Instructions:  Your physician recommends that you continue on your current medications as directed. Please refer to the Current Medication list given to you today. If you need a refill on your cardiac medications before your next appointment, please call your pharmacy.   Lab work: None  If you have labs (blood work) drawn today and your tests are completely normal, you will receive your results only by: . MyChart Message (if you have MyChart) OR . A paper copy in the mail If you have any lab test that is abnormal or we need to change your treatment, we will call you to review the results.  Testing/Procedures: None   Follow-Up: At [facility], you and your health needs are our priority.  As part of our continuing mission to provide you with exceptional heart care, we have created designated Provider Care Teams.  These Care Teams include your primary Cardiologist (physician) and Advanced Practice Providers (APPs -  Physician Assistants and Nurse Practitioners) who all work together to provide you with the care you need, when you need it. You will need a follow up appointment in 6 months.  Please call our office 2 months in advance to schedule this appointment.  You may see Tiffany Nyack, MD or one of the following Advanced Practice Providers on your designated Care Team:   Luke Kilroy, PA-C Krista Kroeger, PA-C . Callie Goodrich, PA-C  Any Other Special Instructions Will Be Listed Below (If Applicable).    

## 2019-04-26 NOTE — Assessment & Plan Note (Signed)
Eliquis 

## 2019-04-26 NOTE — Assessment & Plan Note (Signed)
99% p-mLAD treated with DES 09/14/2018 Chest pain as an OP -Myoview- then cath and PCI (POBA) to jailed Dx 12/05/2018. Procedure complicated by vessel perforation, hemorrhagic tamponade, cardiogenic shock, and AKI.

## 2019-04-26 NOTE — Assessment & Plan Note (Signed)
BMI 36- truncal obesity

## 2019-04-26 NOTE — Assessment & Plan Note (Signed)
S/P pericardial window 12/18/2018

## 2019-04-26 NOTE — Assessment & Plan Note (Signed)
Compliant with CPAP 

## 2019-05-17 ENCOUNTER — Telehealth: Payer: Self-pay | Admitting: *Deleted

## 2019-05-17 NOTE — Telephone Encounter (Signed)
   [facility] Pre-operative Risk Assessment    Request for surgical clearance:  1. What type of surgery is being performed? CT ARTHROGRAM RIGHT SHOULDER   2. When is this surgery scheduled? TBD   3. What type of clearance is required (medical clearance vs. Pharmacy clearance to hold med vs. Both)? MEDICATION     4. Are there any medications that need to be held prior to surgery and how long? ELIQUIS-48 HOURS   5. Practice name and name of physician performing surgery? [facility]    6. What is your office phone number [phone]    7.   What is your office fax number [phone]  8.   Anesthesia type (None, local, MAC, general) ? NOT LISTED

## 2019-05-18 NOTE — Telephone Encounter (Signed)
   Primary Cardiologist: Skeet Latch, MD  Chart reviewed as part of pre-operative protocol coverage. Given past medical history and time since last visit, based on ACC/AHA guidelines, Devon Mathews. would be at acceptable risk for the planned procedure without further cardiovascular testing.   Per pharmacy, ok to hold Eliquis for 48 hrs prior to procedure, but recommend restarting as soon as hemodynamically stable afterwards.    I will route this recommendation to the requesting party via Epic fax function and remove from pre-op pool.  Please call with questions.  Lyda Jester, PA-C 05/18/2019, 3:26 PM

## 2019-05-18 NOTE — Telephone Encounter (Signed)
Patient with diagnosis of Atrial Fibrillation on Eliquis for anticoagulation.    Procedure: arthrogram right shoulder Date of procedure: TBD  CHADS2-VASc score of  5 (HTN, AGE, stroke/tia x 2, CAD)  CrCl 122.3 Platelet count 211  Patient chart notes admit 12/20/2018 for TIA.  Workup was noted to be negative and patient had Eliquis held after admit x 48 hours for drainage of pericardial effusion.  Per chart patient remained neurologically stable.    Based on this information, will hold Eliquis for 48 hrs prior to procedure, but recommend restarting as soon as hemodynamically stable afterwards.

## 2019-05-21 ENCOUNTER — Telehealth: Payer: Self-pay | Admitting: Cardiovascular Disease

## 2019-05-21 NOTE — Telephone Encounter (Signed)
LVM for patient to call and schedule sleep clinic appointment with Dr. Claiborne Billings (08-30-19).  He is a former [place] patient and has not seen anyone since Dr. Rollene Fare left for sleep.

## 2019-06-19 DEATH — deceased

## 2019-08-28 ENCOUNTER — Other Ambulatory Visit: Payer: Self-pay | Admitting: Physician Assistant

## 2019-08-28 NOTE — Telephone Encounter (Signed)
Refill Request.  

## 2019-08-30 ENCOUNTER — Other Ambulatory Visit: Payer: Self-pay

## 2019-08-30 ENCOUNTER — Other Ambulatory Visit: Payer: Self-pay | Admitting: Cardiovascular Disease

## 2019-08-30 ENCOUNTER — Ambulatory Visit (INDEPENDENT_AMBULATORY_CARE_PROVIDER_SITE_OTHER): Payer: 59 | Admitting: Cardiovascular Disease

## 2019-08-30 ENCOUNTER — Telehealth: Payer: Self-pay | Admitting: *Deleted

## 2019-08-30 ENCOUNTER — Encounter: Payer: Self-pay | Admitting: Cardiovascular Disease

## 2019-08-30 VITALS — BP 120/60 | HR 70 | Temp 98.4°F | Ht 70.0 in | Wt 266.0 lb

## 2019-08-30 DIAGNOSIS — Z7901 Long term (current) use of anticoagulants: Secondary | ICD-10-CM | POA: Diagnosis not present

## 2019-08-30 DIAGNOSIS — I2510 Atherosclerotic heart disease of native coronary artery without angina pectoris: Secondary | ICD-10-CM

## 2019-08-30 DIAGNOSIS — G4733 Obstructive sleep apnea (adult) (pediatric): Secondary | ICD-10-CM | POA: Diagnosis not present

## 2019-08-30 DIAGNOSIS — I480 Paroxysmal atrial fibrillation: Secondary | ICD-10-CM

## 2019-08-30 DIAGNOSIS — Z6838 Body mass index (BMI) 38.0-38.9, adult: Secondary | ICD-10-CM

## 2019-08-30 NOTE — Telephone Encounter (Signed)
[employer]Med N30i mask order placed in to Epic

## 2019-08-30 NOTE — Progress Notes (Signed)
Cardiology Office Note    Date:  09/01/2019   ID:  Devon Mathews., DOB May 11, 1951, MRN [id number]  PCP:  Haywood Pao, MD  Cardiologist:  Shelva Majestic, MD   Chief Complaint  Patient presents with   New Patient (Initial Visit)   Shortness of Breath   New sleep evaluation  History of Present Illness:  Devon Mathews. is a 68 y.o. male who is a patient of Dr. Rollene Fare and now sees Dr. Skeet Latch for cardiology care.  The patient presents for a new sleep evaluation.  Devon Mathews has a history of known mild CAD and had undergone initial catheterization in 2003 with which demonstrated a 40% LAD stenosis.  A repeat cath in 2006 this was not significantly changed.  An echo Doppler study in 2017 showed normal LV function with mild LVH.  He suffered a non-STEMI in November 2019 and underwent stenting of his LAD.  In February 2020 as result of recurrent chest pain suggestive of anginal symptoms he underwent attempted intervention of a 95% gel diagonal vessel which was complicated by vessel perforation and pericardial effusion for which he underwent successful pericardial window.  During that hospitalization he did develop atrial fibrillation with CHF and apparently suffered a TIA.  Mr. Devon Mathews has a history of obstructive sleep apnea which dates back to 2014 when a diagnostic polysomnogram done at the [facility] heart and sleep center showed an AHI of 16.5 overall but 58.9/h during REM sleep.  He underwent CPAP titration on August 14, 2013.  His AHI was 1.7/h at 12 cm water pressure but due to a significant positional component with much more significantly abnormal events in supine sleep and with minimum REM sleep during the titration CPAP auto with a minimum pressure of 10 and maximum pressure of 20 cm of water was recommended.  Apparently, the patient was never evaluated from a sleep perspective following his titration study.  He had never been notified for an office visit and as result has  never seen a sleep physician following his initiation of CPAP therapy.  He continues to use his CPAP and admits that he cannot sleep without it.  [employer] is his DME company.  He has had some issues with restless legs.  His machine was not linked to our office but during his office visit, we were able to contact the [employer] to arrange linking of his device to our office for my review.  A download from June 01, 2019 through August 29, 2019 demonstrates 100% compliance with 100% of usage.  He is averaging 8 hours and 34 minutes.  At a auto pressure range of 10 to 20 cm, his 95th percentile is 13.3 with a maximum average pressure of 14.6.  AHI is 5.9.  Central apnea index was 3.5/h.  His EPR was set at 2.  It appears that has a ramp initiation pressure of 5 cm of water.  He typically goes to bed at 11 PM and wakes up at 9 AM.  Presently he is not been working due to the COVID-19 pandemic but previously had been doing roadway inspection.  He denies any residual daytime sleepiness, hypnagogic hallucinations, bruxism or sleep paralysis.  An Epworth Sleepiness Scale score was calculated in the office today and endorsed at 3  as shown below:   Epworth Sleepiness Scale: Situation   Chance of Dozing/Sleeping (0 = never , 1 = slight chance , 2 = moderate chance , 3 = high chance )  sitting and reading 0   watching TV 0   sitting inactive in a public place 0   being a passenger in a motor vehicle for an hour or more 0   lying down in the afternoon 3   sitting and talking to someone 0   sitting quietly after lunch (no alcohol) 0   while stopped for a few minutes in traffic as the driver 0   Total Score  3   He presents for his initial sleep evaluation with me.  Past Medical History:  Diagnosis Date   Atrial fibrillation ([place])    CAD S/P percutaneous coronary angioplasty 09/13/2018    99% p-mLAD (BTW d1&d2) -> SYNERGY DES 3.5 X 12 (3.75 mm). 20% LM.  Cx, small RI & co-dom RCA normal.  EF 65%.    Dizziness  2012   normal findings..   Essential hypertension 07/28/2016   Lower extremity edema 07/28/2016   NSTEMI (non-ST elevated myocardial infarction) ([place]) 09/13/2018   The patient presented 09/13/2018 with a non-ST elevation MI, troponin peak was 0.15   Obesity    Obesity (BMI 30-39.9) 07/28/2016   OSA (obstructive sleep apnea) 07/28/2016   Restless leg syndrome    mild   Shortness of breath 07/28/2016    Past Surgical History:  Procedure Laterality Date   CORONARY BALLOON ANGIOPLASTY N/A 12/05/2018   Procedure: CORONARY BALLOON ANGIOPLASTY;  Surgeon: [name], MD;  Location: [place] [facility];  Service: Cardiovascular;  Laterality: N/A;   CORONARY STENT INTERVENTION N/A 09/13/2018   Procedure: CORONARY STENT INTERVENTION;  Surgeon: Belva Crome, MD;  Location: [place] [facility];  Service: Cardiovascular;; p-m LAD (btw D1&D2) - SYNERGY DES 3.5 x 12 (3.75 mm).   KNEE ARTHROSCOPY Right    LEFT HEART CATH AND CORONARY ANGIOGRAPHY N/A 09/13/2018   Procedure: LEFT HEART CATH AND CORONARY ANGIOGRAPHY;  Surgeon: Belva Crome, MD;  Location: [place] [facility];  Service: Cardiovascular;;  99% p-mLAD (BTW d1&d2) -> SYNERGY DES 3.5 X 12 (3.75 mm). 20% LM.  Cx, small RI & co-dom RCA normal.  EF 65%.    LEFT HEART CATH AND CORONARY ANGIOGRAPHY  2003   30-40% proximal segmental stenosis in the LAD   LEFT HEART CATH AND CORONARY ANGIOGRAPHY  2006   EF >50% & no mitral regurgitation   LEFT HEART CATH AND CORONARY ANGIOGRAPHY N/A 12/05/2018   Procedure: LEFT HEART CATH AND CORONARY ANGIOGRAPHY;  Surgeon: [name], MD;  Location: [place] [facility];  Service: Cardiovascular;  Laterality: N/A;   NM MYOVIEW LTD  07/2016   Normal.  EF 55-65% 63%).  No ischemia or infarction.  LOW RISK   NM MYOVIEW LTD  11/2018   6: 34 min.  7.2 METS.  Noted chest pain and tightness.  Horizontal ST of T wave depression (2 mm) II, 3, V5 and V6 (HIGH RISK), large/severe defect basal-apical  inferior, inferolateral wall.  Large-severe basal-apical anterior-anterolateral wall.  Medium/moderate defect in basal and mid inferoseptal wall.  Consistent with large prior infarct with peri-infarct ischemia.  HIGH RISK   PERICARDIOCENTESIS N/A 12/05/2018   Procedure: PERICARDIOCENTESIS;  Surgeon: [name], MD;  Location: [place] [facility];  Service: Cardiovascular;  Laterality: N/A;   SUBXYPHOID PERICARDIAL WINDOW N/A 12/25/2018   Procedure: SUBXYPHOID PERICARDIAL WINDOW;  Surgeon: [name], MD;  Location: [facility];  Service: Thoracic;  Laterality: N/A;   TEE WITHOUT CARDIOVERSION N/A 12/25/2018   Procedure: TRANSESOPHAGEAL ECHOCARDIOGRAM (TEE);  Surgeon:  [name], MD;  Location: [facility];  Service: Thoracic;  Laterality: N/A;   TRANSTHORACIC ECHOCARDIOGRAM  07/2016   EF 60-65% with mild LVH.  Mild AI.  Trivial MR and TR.    Current Medications: Outpatient Medications Prior to Visit  Medication Sig Dispense Refill   Acetaminophen (TYLENOL PO) Take 2 tablets by mouth as needed (help releive pressure in his chest).     amitriptyline (ELAVIL) 25 MG tablet Take 25 mg by mouth at bedtime as needed for sleep.     atorvastatin (LIPITOR) 80 MG tablet TAKE 1 TABLET BY MOUTH EVERY DAY AT 6PM 90 tablet 0   clopidogrel (PLAVIX) 75 MG tablet Take 1 tablet (75 mg total) by mouth daily. 90 tablet 1   ELIQUIS 5 MG TABS tablet Take 5 mg by mouth 2 (two) times daily.     furosemide (LASIX) 20 MG tablet Take 20-40 mg by mouth See admin instructions. Take 40 mg Bid 12/20/18 and 12/21/18. Starting 12/22/18 daily     metoprolol succinate (TOPROL-XL) 50 MG 24 hr tablet TAKE 1 TABLET BY MOUTH EVERY DAY WITH OR IMMEDIATELY FOLLOWING A MEAL 90 tablet 0   nitroGLYCERIN (NITROSTAT) 0.4 MG SL tablet Place 1 tablet (0.4 mg total) under the tongue every 5 (five) minutes x 3 doses as needed for chest pain. 25 tablet 11   pantoprazole (PROTONIX) 40 MG tablet TAKE ONE TABLET BY MOUTH ONE TIME DAILY.  must make follow up appointment for further refills (Patient taking differently: Take 40 mg by mouth at bedtime. ) 15 tablet 0   potassium chloride (K-DUR,KLOR-CON) 10 MEQ tablet Take 10 mEq by mouth daily.     methocarbamol (ROBAXIN) 500 MG tablet Take 500 mg by mouth 3 (three) times daily as needed for muscle spasms.     No facility-administered medications prior to visit.      Allergies:   Penicillins   Social History   Socioeconomic History   Marital status: Married    Spouse name: Not on file   Number of children: Not on file   Years of education: Not on file   Highest education level: Not on file  Occupational History   Not on file  Social Needs   Financial resource strain: Not on file   Food insecurity    Worry: Not on file    Inability: Not on file   Transportation needs    Medical: Not on file    Non-medical: Not on file  Tobacco Use   Smoking status: Never Smoker   Smokeless tobacco: Never Used  Substance and Sexual Activity   Alcohol use: No   Drug use: No   Sexual activity: Not on file  Lifestyle   Physical activity    Days per week: Not on file    Minutes per session: Not on file   Stress: Not on file  Relationships   Social connections    Talks on phone: Not on file    Gets together: Not on file    Attends religious service: Not on file    Active member of club or organization: Not on file    Attends meetings of clubs or organizations: Not on file    Relationship status: Not on file  Other Topics Concern   Not on file  Social History Narrative   Not on file     Family History:  The patient's family history includes COPD in his father; Diabetes in his brother and sister; Heart attack in his  paternal grandmother and sister; Heart disease in his father; Heart failure in his father, mother, and sister; Other in his father and maternal grandfather.   ROS General: Negative; No fevers, chills, or night sweats;  HEENT: Negative; No  changes in vision or hearing, sinus congestion, difficulty swallowing Pulmonary: Negative; No cough, wheezing, shortness of breath, hemoptysis Cardiovascular: Negative; No chest pain, presyncope, syncope, palpitations GI: Negative; No nausea, vomiting, diarrhea, or abdominal pain GU: Negative; No dysuria, hematuria, or difficulty voiding Musculoskeletal: Negative; no myalgias, joint pain, or weakness Hematologic/Oncology: Negative; no easy bruising, bleeding Endocrine: Negative; no heat/cold intolerance; no diabetes Neuro: Negative; no changes in balance, headaches Skin: Negative; No rashes or skin lesions Psychiatric: Negative; No behavioral problems, depression Sleep: Positive for moderate OSA overall but 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  Has been on CPAP therapy ever since.  Prior history of snoring, daytime sleepiness, hypersomnolence,  restless legs; no bruxism hypnogognic hallucinations, no cataplexy Other comprehensive 14 point system review is negative.   PHYSICAL EXAM:   VS:  BP 120/60 (BP Location: Left Arm, Patient Position: Sitting, Cuff Size: Large)    Pulse 70    Temp 98.4 F (36.9 C)    Ht '5\' 10"'  (1.778 m)    Wt 266 lb (120.7 kg)    BMI 38.17 kg/m     Repeat blood pressure by me was 122/68  Wt Readings from Last 3 Encounters:  08/30/19 266 lb (120.7 kg)  04/26/19 255 lb 3.2 oz (115.8 kg)  02/27/19 246 lb (111.6 kg)    General: Alert, oriented, no distress.  Bearded Skin: normal turgor, no rashes, warm and dry HEENT: Normocephalic, atraumatic. Pupils equal round and reactive to light; sclera anicteric; extraocular muscles intact;  Nose without nasal septal hypertrophy Mouth/Parynx benign; Mallinpatti scale 3 Neck: Thick neck; no JVD, no carotid bruits; normal carotid upstroke Lungs: clear to ausculatation and percussion; no wheezing or rales Chest wall: without tenderness to palpitation Heart: PMI not displaced, RRR, s1 s2 normal,  1/6 systolic murmur, no diastolic murmur, no rubs, gallops, thrills, or heaves Abdomen: Central adiposity soft, nontender; no hepatosplenomehaly, BS+; abdominal aorta nontender and not dilated by palpation. Back: no CVA tenderness Pulses 2+ Musculoskeletal: full range of motion, normal strength, no joint deformities Extremities: no clubbing cyanosis or edema, Homan's sign negative  Neurologic: grossly nonfocal; Cranial nerves grossly wnl Psychologic: Normal mood and affect   Studies/Labs Reviewed:   EKG:  EKG is ordered today.  ECG (independently read by me): Normal sinus rhythm at 70 bpm.  No ectopy.  Normal intervals.  No ST segment changes.  Recent Labs: BMP Latest Ref Rng & Units 12/26/2018 12/25/2018 12/24/2018  Glucose 70 - 99 mg/dL 117(H) 105(H) 98  BUN 8 - 23 mg/dL '18 18 19  ' Creatinine 0.61 - 1.24 mg/dL 0.96 1.05 1.01  BUN/Creat Ratio 10 - 24 - - -  Sodium 135 - 145 mmol/L 136 139 141  Potassium 3.5 - 5.1 mmol/L 4.5 3.8 4.4  Chloride 98 - 111 mmol/L 105 107 108  CO2 22 - 32 mmol/L '22 28 25  ' Calcium 8.9 - 10.3 mg/dL 8.6(L) 8.4(L) 8.8(L)     Hepatic Function Latest Ref Rng & Units 12/20/2018 06/27/2013 01/19/2011  Total Protein 6.5 - 8.1 g/dL 6.6 6.6 6.7  Albumin 3.5 - 5.0 g/dL 3.2(L) 4.1 3.9  AST 15 - 41 U/L '29 26 23  ' ALT 0 - 44 U/L 33 33 24  Alk Phosphatase 38 - 126 U/L 85  62 59  Total Bilirubin 0.3 - 1.2 mg/dL 1.1 0.6 0.6    CBC Latest Ref Rng & Units 12/26/2018 12/25/2018 12/24/2018  WBC 4.0 - 10.5 K/uL 6.1 5.5 5.8  Hemoglobin 13.0 - 17.0 g/dL 12.0(L) 11.6(L) 11.9(L)  Hematocrit 39.0 - 52.0 % 37.5(L) 35.3(L) 35.9(L)  Platelets 150 - 400 K/uL 211 218 230   Lab Results  Component Value Date   MCV 105.0 (H) 12/26/2018   MCV 103.8 (H) 12/25/2018   MCV 104.4 (H) 12/24/2018   Lab Results  Component Value Date   TSH 3.744 06/27/2013   Lab Results  Component Value Date   HGBA1C 5.5 12/21/2018     BNP    Component Value Date/Time   BNP 209.7 (H) 12/20/2018 1000     ProBNP    Component Value Date/Time   PROBNP 37.0 06/23/2008 1423     Lipid Panel     Component Value Date/Time   CHOL 79 12/21/2018 0528   TRIG 54 12/21/2018 0528   HDL 26 (L) 12/21/2018 0528   CHOLHDL 3.0 12/21/2018 0528   VLDL 11 12/21/2018 0528   LDLCALC 42 12/21/2018 0528     RADIOLOGY: No results found.   Additional studies/ records that were reviewed today include:  I reviewed the records from [facility] heart and sleep center from 2014, cardiology follow-up office records, and was able to obtain a download today of his CPAP use over the past 3 months.   ASSESSMENT:    1. OSA (obstructive sleep apnea)   2. Coronary artery disease involving native coronary artery of native heart without angina pectoris   3. PAF (paroxysmal atrial fibrillation) ([place])   4. Chronic anticoagulation   5. Class 2 severe obesity due to excess calories with serious comorbidity and body mass index (BMI) of 38.0 to 38.9 in adult [facility])      PLAN:  Mr. Juwaun Inskeep, Brooke Bonito is a 68 year old gentleman who has significant cardiovascular comorbidities including coronary artery disease, non-ST segment elevation MI leading to stenting of his LAD, subsequent vessel perforation during attempt at intervention on a jailed diagonal vessel requiring pericardial window as well as a history of brief atrial fibrillation and TIA/CVA.  He has been documented to have sleep apnea since 2014 and had been followed by Dr. Rollene Fare.  Apparently when Dr. Rollene Fare retired he was lost to sleep follow-up and had never seen anyone regarding his sleep apnea since his CPAP titration.  He is now followed by Dr. Oval [name] for cardiology care.  I reviewed his sleep studies from 2014 and his CPAP titration trial.  I was able to contact [employer] who is now his DME company after they bought out [facility].  He is 100% compliant with CPAP use and is averaging 8 hours and 34 minutes of CPAP use per night.  He cannot sleep without it.   There is no awareness of breakthrough snoring or residual daytime sleepiness.  I was able to contact [employer] today so that they can link him to our office allowing me to obtain his download.  AHI is 5.9 and he did have some central events.  He has had a 95th percentile pressure of 13.3 with a maximum average pressure of 14.6.  He has had some difficulty with the tubing and has been using a nasal pillow mask and at times has irritation to his nares.  I discussed with him new mask technology and have recommended changing to a ResMed AirFit N 30 i mask and with  this mask the tubing will arise from the crown of his head to allow for greater flexibility in transitioning from side to side while sleeping.  I discussed sleep apnea in detail and its effects on cardiovascular health if left untreated.  Presently he is maintaining sinus rhythm and has not had any recurrent atrial fibrillation.  We discussed the importance of weight loss and increased exercise.  BMI is 38.17.  I have made changes to his auto unit and will increase his minimum pressure to 11 with a range up to 20 cm of water, change his ramp initiation time from 5 to 8 cm and will increase his EPR from 2 up to 3.  As long as he is doing well, I will see him in 1 year for reevaluation.   Medication Adjustments/Labs and Tests Ordered: Current medicines are reviewed at length with the patient today.  Concerns regarding medicines are outlined above.  Medication changes, Labs and Tests ordered today are listed in the Patient Instructions below.  Patient Instructions  Medication Instructions:  Your physician recommends that you continue on your current medications as directed. Please refer to the Current Medication list given to you today.  *If you need a refill on your cardiac medications before your next appointment, please call your pharmacy*  Lab Work: none If you have labs (blood work) drawn today and your tests are completely normal, you will receive  your results only by:  [facility] (if you have MyChart) OR  A paper copy in the mail If you have any lab test that is abnormal or we need to change your treatment, we will call you to review the results.  Testing/Procedures: none  Follow-Up: At [facility], you and your health needs are our priority.  As part of our continuing mission to provide you with exceptional heart care, we have created designated Provider Care Teams.  These Care Teams include your primary Cardiologist (physician) and Advanced Practice Providers (APPs -  Physician Assistants and Nurse Practitioners) who all work together to provide you with the care you need, when you need it.  Your next appointment:   12 months  The format for your next appointment:   In Person  Provider:   Shelva Majestic, MD  Other Instructions You will be contacted by the Sleep Coordinator, Barry Brunner, regarding your CPAP machine     Signed, Shelva Majestic, MD  09/01/2019 2:15 PM    [facility] [address] [place], [place], [place]  [address] Phone: [phone]

## 2019-08-30 NOTE — Telephone Encounter (Signed)
-----   Message from Annita Brod, RN sent at 08/30/2019 12:32 PM EST ----- Regarding: CPAP Hello,  Per Dr. Claiborne Billings, this patient needs prescription for:   ResMed AirFit N30i  Thank you

## 2019-08-30 NOTE — Patient Instructions (Signed)
Medication Instructions:  Your physician recommends that you continue on your current medications as directed. Please refer to the Current Medication list given to you today.  *If you need a refill on your cardiac medications before your next appointment, please call your pharmacy*  Lab Work: none If you have labs (blood work) drawn today and your tests are completely normal, you will receive your results only by: Marland Kitchen MyChart Message (if you have MyChart) OR . A paper copy in the mail If you have any lab test that is abnormal or we need to change your treatment, we will call you to review the results.  Testing/Procedures: none  Follow-Up: At [facility], you and your health needs are our priority.  As part of our continuing mission to provide you with exceptional heart care, we have created designated Provider Care Teams.  These Care Teams include your primary Cardiologist (physician) and Advanced Practice Providers (APPs -  Physician Assistants and Nurse Practitioners) who all work together to provide you with the care you need, when you need it.  Your next appointment:   12 months  The format for your next appointment:   In Person  Provider:   Shelva Majestic, MD  Other Instructions You will be contacted by the Sleep Coordinator, Barry Brunner, regarding your CPAP machine

## 2019-09-03 ENCOUNTER — Other Ambulatory Visit: Payer: Self-pay | Admitting: Cardiovascular Disease

## 2019-10-29 ENCOUNTER — Other Ambulatory Visit: Payer: Self-pay | Admitting: Physician Assistant

## 2019-10-29 NOTE — Telephone Encounter (Signed)
Refill request

## 2019-11-15 ENCOUNTER — Other Ambulatory Visit: Payer: Self-pay | Admitting: *Deleted

## 2019-11-15 ENCOUNTER — Telehealth (INDEPENDENT_AMBULATORY_CARE_PROVIDER_SITE_OTHER): Payer: 59 | Admitting: Cardiovascular Disease

## 2019-11-15 VITALS — BP 138/85 | HR 72 | Ht 70.0 in

## 2019-11-15 DIAGNOSIS — I350 Nonrheumatic aortic (valve) stenosis: Secondary | ICD-10-CM

## 2019-11-15 DIAGNOSIS — I3131 Malignant pericardial effusion in diseases classified elsewhere: Secondary | ICD-10-CM

## 2019-11-15 DIAGNOSIS — R0602 Shortness of breath: Secondary | ICD-10-CM | POA: Diagnosis not present

## 2019-11-15 DIAGNOSIS — I480 Paroxysmal atrial fibrillation: Secondary | ICD-10-CM | POA: Diagnosis not present

## 2019-11-15 DIAGNOSIS — I5033 Acute on chronic diastolic (congestive) heart failure: Secondary | ICD-10-CM | POA: Diagnosis not present

## 2019-11-15 DIAGNOSIS — I2510 Atherosclerotic heart disease of native coronary artery without angina pectoris: Secondary | ICD-10-CM

## 2019-11-15 DIAGNOSIS — I313 Pericardial effusion (noninflammatory): Secondary | ICD-10-CM

## 2019-11-15 DIAGNOSIS — I10 Essential (primary) hypertension: Secondary | ICD-10-CM | POA: Diagnosis not present

## 2019-11-15 DIAGNOSIS — C801 Malignant (primary) neoplasm, unspecified: Secondary | ICD-10-CM

## 2019-11-15 MED ORDER — FUROSEMIDE 40 MG PO TABS: 40.0000 mg | ORAL_TABLET | Freq: Every day | ORAL | 3 refills | Status: DC

## 2019-11-15 NOTE — Patient Instructions (Addendum)
Medication Instructions:  INCREASE YOUR FUROSEMIDE TO 40 MG TWICE A DAY THROUGH Saturday AND THEN STARTING Sunday FUROSEMIDE 40 MG DAILY   *If you need a refill on your cardiac medications before your next appointment, please call your pharmacy*  Lab Work: NONE   Testing/Procedures: NONE   Follow-Up:  11/30/2019 AT 3:45 WITH LUKE K PA IN THE OFFICE

## 2019-11-15 NOTE — Progress Notes (Signed)
Virtual Visit via Video Note   This visit type was conducted due to national recommendations for restrictions regarding the COVID-19 Pandemic (e.g. social distancing) in an effort to limit this patient's exposure and mitigate transmission in our community.  Due to his co-morbid illnesses, this patient is at least at moderate risk for complications without adequate follow up.  This format is felt to be most appropriate for this patient at this time.  All issues noted in this document were discussed and addressed.  A limited physical exam was performed with this format.  Please refer to the patient's chart for his consent to telehealth for [facility].   Date:  11/15/2019   ID:  Devon Glazier., DOB 20-Jun-1951, MRN [id number]  Patient Location: Home Provider Location: Office  PCP:  Haywood Pao, MD  Cardiologist:  Skeet Latch, MD  Electrophysiologist:  None  Sleep: Dr. Claiborne Billings  Evaluation Performed:  Follow-Up Visit  Chief Complaint:  Follow up  History of Present Illness:    Devon Mathews is a 69 y.o. male with CAD s/p NSTEMI and PCI, paroxysmal atrial fibrillation, procedural pericardial effusion/tamponade s/p pericardial window, mild AR, hypertension, hyperlipidemia, OSA and obestiy who presents for follow up.  Devon Mathews was previously seen by Dr. Rollene Fare and by Dr. Einar Gip.  He last saw Dr. Einar Gip 01/2015 and reported mild, chronic dyspnea. He underwent cardiac catheterization in 2003 and 2006 with a 40% LAD lesion noted.  He had an ETT in 2007 that was negative for ischemia and a nuclear stress in 2011 and 2012 that were negative.  He had an echo 07/02/13 that revealed LVEF 55-60% and mild mitral regurgitation. He was seen in clinic 07/2016 and reported several months of chest pain and exertional shortness of breath.  He was referred for exercise Myoview 08/13/16 that revealed LVEF 63% and no ischemia.  He also had an echo 08/16/16 with LVEF 60-65%, mild LVH, mild AR and mild TR.  Devon Mathews had a sleep study in 2014 that demonstrated moderate OSA. He continues to be compliant with CPAP.    Since his last appointment Devon Mathews had an NSTEMI 08/2018.  Troponin peaked at 0.15.  He underwent LHC and had a 95% mid LAD lesion that was successfully stented with a DES.  20% distal left main disease was also noted.  LVEF was 65% on left ventriculogram.  Simvastatin was switched to atorvastatin and metoprolol was increased to 50 mg daily.  Plans were made to continue dual antiplatelet therapy for 1 to 3 months followed by ticagrelor alone.  Since his last appointment Devon Mathews, [place] and reported angina.  He was referred for [facility] 12/01/2018 where there was concern for reversible ischemia in the anterolateral region and infarct with peri-infarct ischemia in the inferolateral and inferoseptal regions.  He subsequently underwent left heart catheterization 12/05/2018 that revealed a 90% ostial D1 lesion.  He underwent successful balloon angioplasty.  However the procedure was complicated by a diagonal wire perforation with subsequent development of a pericardial effusion and tamponade.  He developed hemorrhagic pericarditis and was started on colchicine.  He was discharged but developed A worsening effusion and early tamponade was noted on his echo 12/2018.  He was admitted and underwent pericardial window.  During that time he also developed atrial fibrillation and had a TIA.  Since his last appointment Devon Mathews has been doing well.  He notes that he has not been exercising much and his diet  has been poor.  For the last several weeks he has been noticing that he has been more short of breath with activity.  He also has mild orthopnea.  He notes lower extremity edema that is pitting.  His Lasix does not seem to have much effect when he takes it.  His weight has been increasing and he attributes it to his diet and exercise.  He has been working with Dr. Claiborne Billings and is using his old CPAP  mask.  He is able to tolerate the CPAP machine well and feels good when he uses it.  He has no chest pain or pressure.   The patient does not have symptoms concerning for COVID-19 infection (fever, chills, cough, or new shortness of breath).    Past Medical History:  Diagnosis Date  . Atrial fibrillation ([place])   . CAD S/P percutaneous coronary angioplasty 09/13/2018    99% p-mLAD (BTW d1&d2) -> SYNERGY DES 3.5 X 12 (3.75 mm). 20% LM.  Cx, small RI & co-dom RCA normal.  EF 65%.   . Dizziness 2012   normal findings..  . Essential hypertension 07/28/2016  . Lower extremity edema 07/28/2016  . NSTEMI (non-ST elevated myocardial infarction) ([place]) 09/13/2018   The patient presented 09/13/2018 with a non-ST elevation MI, troponin peak was 0.15  . Obesity   . Obesity (BMI 30-39.9) 07/28/2016  . OSA (obstructive sleep apnea) 07/28/2016  . Restless leg syndrome    mild  . Shortness of breath 07/28/2016   Past Surgical History:  Procedure Laterality Date  . CORONARY BALLOON ANGIOPLASTY N/A 12/05/2018   Procedure: CORONARY BALLOON ANGIOPLASTY;  Surgeon: [name], MD;  Location: [place] [facility];  Service: Cardiovascular;  Laterality: N/A;  . CORONARY STENT INTERVENTION N/A 09/13/2018   Procedure: CORONARY STENT INTERVENTION;  Surgeon: Belva Crome, MD;  Location: [place] [facility];  Service: Cardiovascular;; p-m LAD (btw D1&D2) - SYNERGY DES 3.5 x 12 (3.75 mm).  Marland Kitchen KNEE ARTHROSCOPY Right   . LEFT HEART CATH AND CORONARY ANGIOGRAPHY N/A 09/13/2018   Procedure: LEFT HEART CATH AND CORONARY ANGIOGRAPHY;  Surgeon: Belva Crome, MD;  Location: [place] [facility];  Service: Cardiovascular;;  99% p-mLAD (BTW d1&d2) -> SYNERGY DES 3.5 X 12 (3.75 mm). 20% LM.  Cx, small RI & co-dom RCA normal.  EF 65%.   Marland Kitchen LEFT HEART CATH AND CORONARY ANGIOGRAPHY  2003   30-40% proximal segmental stenosis in the LAD  . LEFT HEART CATH AND CORONARY ANGIOGRAPHY  2006   EF >50% & no mitral regurgitation  .  LEFT HEART CATH AND CORONARY ANGIOGRAPHY N/A 12/05/2018   Procedure: LEFT HEART CATH AND CORONARY ANGIOGRAPHY;  Surgeon: [name], MD;  Location: [place] [facility];  Service: Cardiovascular;  Laterality: N/A;  . NM MYOVIEW LTD  07/2016   Normal.  EF 55-65% 63%).  No ischemia or infarction.  LOW RISK  . NM MYOVIEW LTD  11/2018   6: 34 min.  7.2 METS.  Noted chest pain and tightness.  Horizontal ST of T wave depression (2 mm) II, 3, V5 and V6 (HIGH RISK), large/severe defect basal-apical inferior, inferolateral wall.  Large-severe basal-apical anterior-anterolateral wall.  Medium/moderate defect in basal and mid inferoseptal wall.  Consistent with large prior infarct with peri-infarct ischemia.  HIGH RISK  . PERICARDIOCENTESIS N/A 12/05/2018   Procedure: PERICARDIOCENTESIS;  Surgeon: [name], MD;  Location: [place] [facility];  Service: Cardiovascular;  Laterality: N/A;  . SUBXYPHOID PERICARDIAL WINDOW  N/A 12/25/2018   Procedure: SUBXYPHOID PERICARDIAL WINDOW;  Surgeon: [name], MD;  Location: [place] OR;  Service: Thoracic;  Laterality: N/A;  . TEE WITHOUT CARDIOVERSION N/A 12/25/2018   Procedure: TRANSESOPHAGEAL ECHOCARDIOGRAM (TEE);  Surgeon: [name], MD;  Location: [facility] OR;  Service: Thoracic;  Laterality: N/A;  . TRANSTHORACIC ECHOCARDIOGRAM  07/2016   EF 60-65% with mild LVH.  Mild AI.  Trivial MR and TR.     Current Meds  Medication Sig  . Acetaminophen (TYLENOL PO) Take 2 tablets by mouth as needed (help releive pressure in his chest).  Marland Kitchen amitriptyline (ELAVIL) 25 MG tablet Take 12.5 mg by mouth at bedtime as needed for sleep.   Marland Kitchen atorvastatin (LIPITOR) 80 MG tablet TAKE 1 TABLET BY MOUTH EVERY DAY AT 6PM  . clopidogrel (PLAVIX) 75 MG tablet TAKE 1 TABLET BY MOUTH EVERY DAY  . ELIQUIS 5 MG TABS tablet Take 5 mg by mouth 2 (two) times daily.  . metoprolol succinate (TOPROL-XL) 50 MG 24 hr tablet TAKE 1 TABLET BY MOUTH EVERY DAY WITH OR IMMEDIATELY FOLLOWING A MEAL  .  nitroGLYCERIN (NITROSTAT) 0.4 MG SL tablet Place 1 tablet (0.4 mg total) under the tongue every 5 (five) minutes as needed for chest pain.  . pantoprazole (PROTONIX) 40 MG tablet TAKE ONE TABLET BY MOUTH ONE TIME DAILY. must make follow up appointment for further refills (Patient taking differently: Take 40 mg by mouth at bedtime. )  . potassium chloride (K-DUR,KLOR-CON) 10 MEQ tablet Take 10 mEq by mouth daily.  . [DISCONTINUED] furosemide (LASIX) 20 MG tablet Take 20 mg by mouth daily.     Allergies:   Penicillins   Social History   Tobacco Use  . Smoking status: Never Smoker  . Smokeless tobacco: Never Used  Substance Use Topics  . Alcohol use: No  . Drug use: No     Family Hx: The patient's family history includes COPD in his father; Diabetes in his brother and sister; Heart attack in his paternal grandmother and sister; Heart disease in his father; Heart failure in his father, mother, and sister; Other in his father and maternal grandfather.  ROS:   Please see the history of present illness.     All other systems reviewed and are negative.   Prior CV studies:   The following studies were reviewed today:  Lexiscan Myoview 12/01/18:  Nuclear stress EF: 54%.  The left ventricular ejection fraction is mildly decreased (45-54%).  Horizontal ST segment depression ST segment depression of 2 mm was noted during stress in the II, III, V5 and V6 leads, beginning at 8 minutes of stressST deviation beginning in recovery.  Defect 1: There is a large defect of severe severity present in the basal inferior, basal inferolateral, mid inferior, mid inferolateral, apical inferior, apical lateral and apex location.  Defect 2: There is a large defect of severe severity present in the basal anterior, basal anterolateral, mid anterior, mid anterolateral, apical anterior and apex location.  Defect 3: There is a medium defect of moderate severity present in the basal inferoseptal and mid  inferoseptal location.  Findings consistent with ischemia and prior myocardial infarction with peri-infarct ischemia.  This is a high risk study.   There is a medium size, severe perfusion defect in the inferior and inferolateral walls with stress that is partially reversible at rest, returning to moderate. Base to apex. There is a absent, large perfusion defect in the anterior wall from base to apex. It is partially  reversible at rest and returns to moderate at the base and mid ventricle and returns to mild at the apex. Anterolateral defect is severe, and with full reversibility. Medium size, moderate perfusion defect in the inferoseptum with partial reversibility, returning to mild at rest.   LHC 12/05/18: SUMMARY  Culprit lesion:   90% ostial 1st Diag (jailed) -successful scoring balloon angioplasty using 2.5 mm Wolverine scoring balloon  --Distal diagonal wire perforation with pericardial effusion and initial stages of pericardial tamponade --> successful occlusion with prolonged balloon inflations and administration of protamine  --Unsuccessful pericardiocentesis  Widely patent LAD stent  Otherwise stable coronary arteries.  Normal LVEF and EDP pre-PCI  Clear ST elevations for roughly 15 minutes, most likely type for MI.  Echo 12/06/18: IMPRESSIONS    1. The left ventricle has normal systolic function, with an ejection fraction of 55-60%. The cavity size was normal. Left ventricular diastolic parameters were normal.  2. Small pericardial effusion seen at the apex, lateral wall and anterior to RV.  3. The mitral valve is degenerative. Mild thickening of the mitral valve leaflet. Mild calcification of the mitral valve leaflet.  4. The aortic valve is tricuspid There is Moderate thickening of the aortic valve There is Mild calcification of the aortic valveAortic valve regurgitation is trivial by color flow Doppler.  Echo 12/21/18:  IMPRESSIONS   1. The left ventricle has  hyperdynamic systolic function, with an ejection fraction of >65%. Left ventricular diastology could not be evaluated due to nondiagnostic images.  2. The mitral valve is normal in structure.  3. Aortic valve regurgitation is mild by color flow Doppler.  4. Moderate pericardial effusion.  5. The pericardial effusion is circumferential.  6. There is inversion of the right ventricular wall, inversion of the right atrial wall and excessive respiratory variation in the mitral valve spectral Doppler velocities.  7. When compared to the prior study: Compared to study 11/2018, the pericardial effusion has incrased in size and now significant effusion present posterior as well as anteriorly. There is RA inversion and suggestive of subtle intermittent RV diastolic  collapse. There is some respiratory variation in the [facility] inlow pattern with respirations. The IVC is not well visualized. Concern for early tamponade.   Carotid Doppler 12/21/18: Minimal plaque bilaterally.  Labs/Other Tests and Data Reviewed:    EKG:  No ECG reviewed.  Recent Labs: 12/20/2018: ALT 33; B Natriuretic Peptide 209.7 12/26/2018: BUN 18; Creatinine, Ser 0.96; Hemoglobin 12.0; Platelets 211; Potassium 4.5; Sodium 136   Recent Lipid Panel Lab Results  Component Value Date/Time   CHOL 79 12/21/2018 05:28 AM   TRIG 54 12/21/2018 05:28 AM   HDL 26 (L) 12/21/2018 05:28 AM   CHOLHDL 3.0 12/21/2018 05:28 AM   LDLCALC 42 12/21/2018 05:28 AM    Wt Readings from Last 3 Encounters:  08/30/19 266 lb (120.7 kg)  04/26/19 255 lb 3.2 oz (115.8 kg)  02/27/19 246 lb (111.6 kg)     Objective:    VS:  BP 138/85   Pulse 72   Ht 5\' 10"  (1.778 m)   BMI 38.17 kg/m  , BMI Body mass index is 38.17 kg/m. GENERAL:  Well appearing LUNGS:  Respirations unlabored EXT:  +pitting edema NEURO:  Speech fluent PSYCH:  Cognitively intact, oriented to person place and time   ASSESSMENT & PLAN:    # CAD: # Hyperlipidemia: Devon Mathews had a  NSTEMI 08/2018.  He underwent PCI of the LAD and then had balloon angioplasty of D1.  He has no angina and is doing well with exercise.  We will get him set up with cardiac rehab once it opens again.  Continue clopidogrel and Eliqus. Continue metoprolol and atorvastatin.  LDL was 42 on 12/2018.   # Acute on chronic diastolic heart failure: He notes orthopnea and increased edema.  Increase lasix to 40mg  bid x3 days then 40 mg daily.  Continue metoprolol.  Unclear if this is 2/2 atrial fibrillation or dietary indiscretion and weight gain.  He needs an EKG at follow up.   # Pericardial effusion: # Tamponade: Doing well clinically s/p subxyphoid window.  There was a microperforation during balloon angioplasty.  No chest pain after stopping colchicine.  He has increased edema lately.  There is no recurrent effusion on his echo 03/2019.  # Mild aortic stenosis:  Repeat echo in 2022.  # PAF:  Mr. Mathews had atrial fibrillation that was likely 2/2 his effusion and tamponade.He denies palpitations but does have HF symptoms.  We will work on diuresis as above.  He needs an in office assessment and EKG at follow-up.  # Hypertension: Blood pressure mildly elevated.  This is probably due to volume overload as well as dietary indiscretion while traveling.  Increase Lasix as above.  Continue metoprolol.  # OSA: Continue CPAP.  COVID-19 Education: The signs and symptoms of COVID-19 were discussed with the patient and how to seek care for testing (follow up with PCP or arrange E-visit).  The importance of social distancing was discussed today.  Time:   Today, I have spent 25 minutes with the patient with telehealth technology discussing the above problems.     Medication Adjustments/Labs and Tests Ordered: Current medicines are reviewed at length with the patient today.  Concerns regarding medicines are outlined above.   Tests Ordered: No orders of the defined types were placed in this  encounter.   Medication Changes: No orders of the defined types were placed in this encounter.   Disposition:  Follow up in 1 month(s)  Signed, Skeet Latch, MD  11/15/2019 11:07 AM    [place]

## 2019-11-18 ENCOUNTER — Other Ambulatory Visit: Payer: Self-pay | Admitting: Cardiology

## 2019-11-20 ENCOUNTER — Other Ambulatory Visit: Payer: Self-pay | Admitting: Cardiology

## 2019-11-20 NOTE — Telephone Encounter (Signed)
Rx has been sent to the pharmacy electronically. ° °

## 2019-11-30 ENCOUNTER — Other Ambulatory Visit: Payer: Self-pay

## 2019-11-30 ENCOUNTER — Ambulatory Visit (INDEPENDENT_AMBULATORY_CARE_PROVIDER_SITE_OTHER): Payer: 59 | Admitting: Cardiology

## 2019-11-30 ENCOUNTER — Encounter: Payer: Self-pay | Admitting: Cardiology

## 2019-11-30 VITALS — BP 120/58 | HR 71 | Temp 97.9°F | Ht 70.0 in | Wt 284.0 lb

## 2019-11-30 DIAGNOSIS — I480 Paroxysmal atrial fibrillation: Secondary | ICD-10-CM

## 2019-11-30 DIAGNOSIS — I10 Essential (primary) hypertension: Secondary | ICD-10-CM

## 2019-11-30 DIAGNOSIS — I313 Pericardial effusion (noninflammatory): Secondary | ICD-10-CM

## 2019-11-30 DIAGNOSIS — G4733 Obstructive sleep apnea (adult) (pediatric): Secondary | ICD-10-CM

## 2019-11-30 DIAGNOSIS — E669 Obesity, unspecified: Secondary | ICD-10-CM

## 2019-11-30 DIAGNOSIS — I3139 Other pericardial effusion (noninflammatory): Secondary | ICD-10-CM

## 2019-11-30 DIAGNOSIS — Z7901 Long term (current) use of anticoagulants: Secondary | ICD-10-CM

## 2019-11-30 DIAGNOSIS — I2510 Atherosclerotic heart disease of native coronary artery without angina pectoris: Secondary | ICD-10-CM

## 2019-11-30 DIAGNOSIS — Z9861 Coronary angioplasty status: Secondary | ICD-10-CM

## 2019-11-30 DIAGNOSIS — Z8673 Personal history of transient ischemic attack (TIA), and cerebral infarction without residual deficits: Secondary | ICD-10-CM

## 2019-11-30 DIAGNOSIS — E785 Hyperlipidemia, unspecified: Secondary | ICD-10-CM

## 2019-11-30 DIAGNOSIS — I5032 Chronic diastolic (congestive) heart failure: Secondary | ICD-10-CM | POA: Insufficient documentation

## 2019-11-30 DIAGNOSIS — I5033 Acute on chronic diastolic (congestive) heart failure: Secondary | ICD-10-CM

## 2019-11-30 HISTORY — DX: Chronic diastolic (congestive) heart failure: I50.32

## 2019-11-30 MED ORDER — METOLAZONE 2.5 MG PO TABS: ORAL_TABLET | ORAL | 1 refills | Status: DC

## 2019-11-30 NOTE — Assessment & Plan Note (Signed)
On Eliquis since TIA (PAF)

## 2019-11-30 NOTE — Assessment & Plan Note (Signed)
Compliant with CPAP- he was tried on a new mask but couldn't tolerate it and is back to his old mask

## 2019-11-30 NOTE — Assessment & Plan Note (Signed)
On presentation March 2020

## 2019-11-30 NOTE — Assessment & Plan Note (Signed)
I'm not sure how else to explain weight gain and edema- check BNP and try increasing his diuretics.

## 2019-11-30 NOTE — Assessment & Plan Note (Signed)
Controlled.  

## 2019-11-30 NOTE — Assessment & Plan Note (Signed)
On Presentation with pericardial effusion March 2020- NSR since then

## 2019-11-30 NOTE — Progress Notes (Signed)
Cardiology Office Note:    Date:  11/30/2019   ID:  Devon Mathews., DOB 09-26-1951, MRN [id number]  PCP:  Haywood Pao, MD  Cardiologist:  Skeet Latch, MD  Electrophysiologist:  None   Referring MD: Haywood Pao, MD   No chief complaint on file.   History of Present Illness:    Devon Mathews. is a 69 y.o. male with a hx of CAD, PAF, TIA, obesity, and OSA.   The patient presented to [facility] 09/13/2018 with a non-STEMI with a troponin peak of 0.15. He underwent diagnostic catheterization and LAD stenting on 09/14/2018. He had 20% left main narrowing but no other significant disease. His LV function was preserved.  In Feb 2020 he developed chest discomfort as an OP concerning for angina. Myoview was done 12/01/2018 and was abnormal. He was admitted for diagnostic cath 12/05/2018. This revealed a 95% jailed Dx1, the previously placed LAD stent was patent. The pt underwent PCI to the Dx that was complicated by vessel perforation and pericardial effusion. He was discharged with plans for medical Rx but ended up in AF with CHF and a TIA 12/20/2018.  He underwent pericardial window 12/25/2018.  After this he lost his job 2/2 COVID-19 and his wife was furloughed. However they are doing OK financially.  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 He admits he doesn't always eat healthy since he is on the road a lot. He does have some LE edema and chest "fullness".  His weight is up to 284 lbs today, he was 266 lbs in Nov.   He was last seen 11/15/2019 by Dr Oval [name]. He complained of increased weight and edema. His Lasix was increased for a few days but he says he could tell no difference.   Past Medical History:  Diagnosis Date  . Atrial fibrillation ([place])   . CAD S/P percutaneous coronary angioplasty 09/13/2018    99% p-mLAD (BTW d1&d2) -> SYNERGY DES 3.5 X 12 (3.75 mm). 20% LM.  Cx, small RI & co-dom RCA normal.   EF 65%.   . Dizziness 2012   normal findings..  . Essential hypertension 07/28/2016  . Lower extremity edema 07/28/2016  . NSTEMI (non-ST elevated myocardial infarction) ([place]) 09/13/2018   The patient presented 09/13/2018 with a non-ST elevation MI, troponin peak was 0.15  . Obesity   . Obesity (BMI 30-39.9) 07/28/2016  . OSA (obstructive sleep apnea) 07/28/2016  . Restless leg syndrome    mild  . Shortness of breath 07/28/2016    Past Surgical History:  Procedure Laterality Date  . CORONARY BALLOON ANGIOPLASTY N/A 12/05/2018   Procedure: CORONARY BALLOON ANGIOPLASTY;  Surgeon: [name], MD;  Location: [facility];  Service: Cardiovascular;  Laterality: N/A;  . CORONARY STENT INTERVENTION N/A 09/13/2018   Procedure: CORONARY STENT INTERVENTION;  Surgeon: Belva Crome, MD;  Location: [place] [facility];  Service: Cardiovascular;; p-m LAD (btw D1&D2) - SYNERGY DES 3.5 x 12 (3.75 mm).  Marland Kitchen KNEE ARTHROSCOPY Right   . LEFT HEART CATH AND CORONARY ANGIOGRAPHY N/A 09/13/2018   Procedure: LEFT HEART CATH AND CORONARY ANGIOGRAPHY;  Surgeon: Belva Crome, MD;  Location: [place] [facility];  Service: Cardiovascular;;  99% p-mLAD (BTW d1&d2) -> SYNERGY DES 3.5 X 12 (3.75 mm). 20% LM.  Cx, small RI & co-dom RCA normal.  EF 65%.   Marland Kitchen LEFT HEART CATH AND  CORONARY ANGIOGRAPHY  2003   30-40% proximal segmental stenosis in the LAD  . LEFT HEART CATH AND CORONARY ANGIOGRAPHY  2006   EF >50% & no mitral regurgitation  . LEFT HEART CATH AND CORONARY ANGIOGRAPHY N/A 12/05/2018   Procedure: LEFT HEART CATH AND CORONARY ANGIOGRAPHY;  Surgeon: [name], MD;  Location: [place] [facility];  Service: Cardiovascular;  Laterality: N/A;  . NM MYOVIEW LTD  07/2016   Normal.  EF 55-65% 63%).  No ischemia or infarction.  LOW RISK  . NM MYOVIEW LTD  11/2018   6: 34 min.  7.2 METS.  Noted chest pain and tightness.  Horizontal ST of T wave depression (2 mm) II, 3, V5 and V6 (HIGH RISK),  large/severe defect basal-apical inferior, inferolateral wall.  Large-severe basal-apical anterior-anterolateral wall.  Medium/moderate defect in basal and mid inferoseptal wall.  Consistent with large prior infarct with peri-infarct ischemia.  HIGH RISK  . PERICARDIOCENTESIS N/A 12/05/2018   Procedure: PERICARDIOCENTESIS;  Surgeon: [name], MD;  Location: [place];  Service: Cardiovascular;  Laterality: N/A;  . SUBXYPHOID PERICARDIAL WINDOW N/A 12/25/2018   Procedure: SUBXYPHOID PERICARDIAL WINDOW;  Surgeon: [name], MD;  Location: [place];  Service: Thoracic;  Laterality: N/A;  . TEE WITHOUT CARDIOVERSION N/A 12/25/2018   Procedure: TRANSESOPHAGEAL ECHOCARDIOGRAM (TEE);  Surgeon: [name], MD;  [facility];  Service: Thoracic;  Laterality: N/A;  . TRANSTHORACIC ECHOCARDIOGRAM  07/2016   EF 60-65% with mild LVH.  Mild AI.  Trivial MR and TR.    Current Medications: Current Meds  Medication Sig  . Acetaminophen (TYLENOL PO) Take 2 tablets by mouth as needed (help releive pressure in his chest).  Marland Kitchen amitriptyline (ELAVIL) 25 MG tablet Take 12.5 mg by mouth at bedtime as needed for sleep.   Marland Kitchen atorvastatin (LIPITOR) 80 MG tablet TAKE 1 TABLET BY MOUTH EVERY DAY AT 6PM  . clopidogrel (PLAVIX) 75 MG tablet TAKE 1 TABLET BY MOUTH EVERY DAY  . ELIQUIS 5 MG TABS tablet Take 5 mg by mouth 2 (two) times daily.  . furosemide (LASIX) 40 MG tablet Take 1 tablet (40 mg total) by mouth daily.  . metoprolol succinate (TOPROL-XL) 50 MG 24 hr tablet TAKE 1 TABLET BY MOUTH EVERY DAY WITH OR IMMEDIATELY FOLLOWING A MEAL  . nitroGLYCERIN (NITROSTAT) 0.4 MG SL tablet Place 1 tablet (0.4 mg total) under the tongue every 5 (five) minutes as needed for chest pain.  . pantoprazole (PROTONIX) 40 MG tablet TAKE ONE TABLET BY MOUTH ONE TIME DAILY. must make follow up appointment for further refills (Patient taking differently: Take 40 mg by mouth at bedtime. )  . potassium chloride  (K-DUR,KLOR-CON) 10 MEQ tablet Take 10 mEq by mouth daily.     Allergies:   Penicillins   Social History   Socioeconomic History  . Marital status: Married    Spouse name: Not on file  . Number of children: Not on file  . Years of education: Not on file  . Highest education level: Not on file  Occupational History  . Not on file  Tobacco Use  . Smoking status: Never Smoker  . Smokeless tobacco: Never Used  Substance and Sexual Activity  . Alcohol use: No  . Drug use: No  . Sexual activity: Not on file  Other Topics Concern  . Not on file  Social History Narrative  . Not on file   Social Determinants of Health   Financial Resource Strain:   .  Difficulty of Paying Living Expenses: Not on file  Food Insecurity:   . Worried About Charity fundraiser in the Last Year: Not on file  . Ran Out of Food in the Last Year: Not on file  Transportation Needs:   . Lack of Transportation (Medical): Not on file  . Lack of Transportation (Non-Medical): Not on file  Physical Activity:   . Days of Exercise per Week: Not on file  . Minutes of Exercise per Session: Not on file  Stress:   . Feeling of Stress : Not on file  Social Connections:   . Frequency of Communication with Friends and Family: Not on file  . Frequency of Social Gatherings with Friends and Family: Not on file  . Attends Religious Services: Not on file  . Active Member of Clubs or Organizations: Not on file  . Attends Archivist Meetings: Not on file  . Marital Status: Not on file     Family History: The patient's family history includes COPD in his father; Diabetes in his brother and sister; Heart attack in his paternal grandmother and sister; Heart disease in his father; Heart failure in his father, mother, and sister; Other in his father and maternal grandfather.  ROS:   Please see the history of present illness.     All other systems reviewed and are negative.  EKGs/Labs/Other Studies Reviewed:     The following studies were reviewed today: Echo June [id number] LVF, no diastolic dysfunction.   EKG:  EKG is ordered today.  The ekg ordered today demonstrates NSR-HR 68  Recent Labs: 12/20/2018: ALT 33; B Natriuretic Peptide 209.7 12/26/2018: BUN 18; Creatinine, Ser 0.96; Hemoglobin 12.0; Platelets 211; Potassium 4.5; Sodium 136  Recent Lipid Panel    Component Value Date/Time   CHOL 79 12/21/2018 0528   TRIG 54 12/21/2018 0528   HDL 26 (L) 12/21/2018 0528   CHOLHDL 3.0 12/21/2018 0528   VLDL 11 12/21/2018 0528   LDLCALC 42 12/21/2018 0528    Physical Exam:    VS:  BP (!) 120/58   Pulse 71   Temp 97.9 F (36.6 C) (Temporal)   Ht 5\' 10"  (1.778 m)   Wt 284 lb (128.8 kg)   SpO2 98%   BMI 40.75 kg/m     Wt Readings from Last 3 Encounters:  11/30/19 284 lb (128.8 kg)  08/30/19 266 lb (120.7 kg)  04/26/19 255 lb 3.2 oz (115.8 kg)     GEN: Obese, caucasian male, well developed in no acute distress HEENT: Normal NECK: No JVD; No carotid bruits CARDIAC: RRR, no murmurs, rubs, gallops RESPIRATORY:  Clear to auscultation without rales, wheezing or rhonchi  ABDOMEN: Soft, non-tender, non-distended MUSCULOSKELETAL:  Trace to 1+ LE edema; No deformity  SKIN: Warm and dry NEUROLOGIC:  Alert and oriented x 3 PSYCHIATRIC:  Normal affect   ASSESSMENT:    Acute on chronic diastolic (congestive) heart failure (HCC) I'm not sure how else to explain weight gain and edema- check BNP and try increasing his diuretics.   CAD S/P percutaneous coronary angioplasty  99% p-mLAD treated with DES 09/14/2018 Chest pain as an OP -Myoview- then cath and PCI (POBA) to jailed Dx 12/05/2018. Procedure complicated by vessel perforation, hemorrhagic tamponade, cardiogenic shock, and AKI.  OSA (obstructive sleep apnea) Compliant with CPAP- he was tried on a new mask but couldn't tolerate it and is back to his old mask  Essential hypertension Controlled  History of TIA (transient ischemic  attack) On presentation  March 2020  Chronic anticoagulation On Eliquis since TIA (PAF)  PAF (paroxysmal atrial fibrillation) ([place]) On Presentation with pericardial effusion March 2020- NSR since then  Obesity (BMI 30-39.9) BMI 38- truncal obesity  PLAN:    Difficult to tell on exam if he is in CHF.  Going by his symptoms and history will treat as such.  Try Metolazone for a few doses, then twice a week.  Check BMP and BNP today.  Check echo for LVF and diastolic function.  F/U with me in two weeks.     Medication Adjustments/Labs and Tests Ordered: Current medicines are reviewed at length with the patient today.  Concerns regarding medicines are outlined above.  Orders Placed This Encounter  Procedures  . Basic Metabolic Panel (BMET)  . Pro b natriuretic peptide (BNP)[facility])  . ECHOCARDIOGRAM COMPLETE   Meds ordered this encounter  Medications  . metolazone (ZAROXOLYN) 2.5 MG tablet    Sig: TAKE AS DIRECTED    Dispense:  30 tablet    Refill:  1    Patient Instructions  Medication Instructions:  INCREASE Lasix to 80mg , tomorrow and Sunday, THEN decrease back to 40mg  once a day  TAKE an EXTRA 40mg  of Lasix today TAKE METOLAZONE THIRTY (30) MINUTES BEFORE TAKING LASIX Take Metolazone Saturday and Sunday then take on Tuesdays and Fridays ONLY *If you need a refill on your cardiac medications before your next appointment, please call your pharmacy*  Lab Work: Your physician recommends that you return for lab work in: TODAY-BMET, BNP If you have labs (blood work) drawn today and your tests are completely normal, you will receive your results only by: Marland Kitchen MyChart Message (if you have MyChart) OR . A paper copy in the mail If you have any lab test that is abnormal or we need to change your treatment, we will call you to review the results.  Testing/Procedures: Your physician has requested that you have an echocardiogram. Echocardiography is a painless  test that uses sound waves to create images of your heart. It provides your doctor with information about the size and shape of your heart and how well your heart's chambers and valves are working. This procedure takes approximately one hour. There are no restrictions for this procedure. TEST WILL BE COMPLETED AT [address]  Follow-Up: At [facility], you and your health needs are our priority.  As part of our continuing mission to provide you with exceptional heart care, we have created designated Provider Care Teams.  These Care Teams include your primary Cardiologist (physician) and Advanced Practice Providers (APPs -  Physician Assistants and Nurse Practitioners) who all work together to provide you with the care you need, when you need it.  Your next appointment:   2 week(s)  The format for your next appointment:   In Person  Provider:   Kerin Ransom, PA-C  Other Instructions     Signed, Yonason Fichter, PA-C  11/30/2019 4:32 PM    [facility]

## 2019-11-30 NOTE — Assessment & Plan Note (Signed)
BMI 38- truncal obesity

## 2019-11-30 NOTE — Patient Instructions (Addendum)
Medication Instructions:  INCREASE Lasix to 80mg , tomorrow and Sunday, THEN decrease back to 40mg  once a day  TAKE an EXTRA 40mg  of Lasix today TAKE METOLAZONE THIRTY (30) MINUTES BEFORE TAKING LASIX Take Metolazone Saturday and Sunday then take on Tuesdays and Fridays ONLY *If you need a refill on your cardiac medications before your next appointment, please call your pharmacy*  Lab Work: Your physician recommends that you return for lab work in: TODAY-BMET, BNP If you have labs (blood work) drawn today and your tests are completely normal, you will receive your results only by: Marland Kitchen MyChart Message (if you have MyChart) OR . A paper copy in the mail If you have any lab test that is abnormal or we need to change your treatment, we will call you to review the results.  Testing/Procedures: Your physician has requested that you have an echocardiogram. Echocardiography is a painless test that uses sound waves to create images of your heart. It provides your doctor with information about the size and shape of your heart and how well your heart's chambers and valves are working. This procedure takes approximately one hour. There are no restrictions for this procedure. TEST WILL BE COMPLETED AT [address]  Follow-Up: At [facility], you and your health needs are our priority.  As part of our continuing mission to provide you with exceptional heart care, we have created designated Provider Care Teams.  These Care Teams include your primary Cardiologist (physician) and Advanced Practice Providers (APPs -  Physician Assistants and Nurse Practitioners) who all work together to provide you with the care you need, when you need it.  Your next appointment:   2 week(s)  The format for your next appointment:   In Person  Provider:   Kerin Ransom, PA-C  Other Instructions

## 2019-11-30 NOTE — Assessment & Plan Note (Signed)
99% p-mLAD treated with DES 09/14/2018 Chest pain as an OP -Myoview- then cath and PCI (POBA) to jailed Dx 12/05/2018. Procedure complicated by vessel perforation, hemorrhagic tamponade, cardiogenic shock, and AKI.

## 2019-12-01 LAB — BASIC METABOLIC PANEL
BUN/Creatinine Ratio: 11 (ref 10–24)
BUN: 13 mg/dL (ref 8–27)
CO2: 23 mmol/L (ref 20–29)
Calcium: 9 mg/dL (ref 8.6–10.2)
Chloride: 106 mmol/L (ref 96–106)
Creatinine, Ser: 1.16 mg/dL (ref 0.76–1.27)
GFR calc Af Amer: 75 mL/min/{1.73_m2} (ref >59)
GFR calc non Af Amer: 65 mL/min/{1.73_m2} (ref >59)
Glucose: 116 mg/dL — ABNORMAL HIGH (ref 65–99)
Potassium: 5 mmol/L (ref 3.5–5.2)
Sodium: 141 mmol/L (ref 134–144)

## 2019-12-01 LAB — PRO B NATRIURETIC PEPTIDE: NT-Pro BNP: 158 pg/mL (ref 0–376)

## 2019-12-03 ENCOUNTER — Ambulatory Visit (HOSPITAL_COMMUNITY)
Admission: RE | Admit: 2019-12-03 | Discharge: 2019-12-03 | Disposition: A | Discharge status: Home / Self Care | Payer: 59 | Source: Ambulatory Visit | Attending: Cardiology | Admitting: Cardiology

## 2019-12-03 ENCOUNTER — Other Ambulatory Visit: Payer: Self-pay

## 2019-12-03 DIAGNOSIS — I2510 Atherosclerotic heart disease of native coronary artery without angina pectoris: Secondary | ICD-10-CM | POA: Diagnosis not present

## 2019-12-03 DIAGNOSIS — Z7901 Long term (current) use of anticoagulants: Secondary | ICD-10-CM | POA: Diagnosis not present

## 2019-12-03 DIAGNOSIS — I110 Hypertensive heart disease with heart failure: Secondary | ICD-10-CM | POA: Diagnosis present

## 2019-12-03 DIAGNOSIS — I10 Essential (primary) hypertension: Secondary | ICD-10-CM

## 2019-12-03 DIAGNOSIS — I5033 Acute on chronic diastolic (congestive) heart failure: Secondary | ICD-10-CM | POA: Insufficient documentation

## 2019-12-03 DIAGNOSIS — I313 Pericardial effusion (noninflammatory): Secondary | ICD-10-CM | POA: Diagnosis not present

## 2019-12-03 DIAGNOSIS — E785 Hyperlipidemia, unspecified: Secondary | ICD-10-CM | POA: Diagnosis not present

## 2019-12-03 DIAGNOSIS — G4733 Obstructive sleep apnea (adult) (pediatric): Secondary | ICD-10-CM

## 2019-12-03 DIAGNOSIS — I214 Non-ST elevation (NSTEMI) myocardial infarction: Secondary | ICD-10-CM | POA: Insufficient documentation

## 2019-12-03 DIAGNOSIS — Z9861 Coronary angioplasty status: Secondary | ICD-10-CM | POA: Diagnosis not present

## 2019-12-03 DIAGNOSIS — I480 Paroxysmal atrial fibrillation: Secondary | ICD-10-CM | POA: Insufficient documentation

## 2019-12-03 DIAGNOSIS — E669 Obesity, unspecified: Secondary | ICD-10-CM | POA: Diagnosis not present

## 2019-12-03 DIAGNOSIS — Z8673 Personal history of transient ischemic attack (TIA), and cerebral infarction without residual deficits: Secondary | ICD-10-CM

## 2019-12-03 NOTE — Progress Notes (Signed)
  Echocardiogram 2D Echocardiogram has been performed.  Jannett Celestine 12/03/2019, 3:15 PM

## 2019-12-05 ENCOUNTER — Other Ambulatory Visit (INDEPENDENT_AMBULATORY_CARE_PROVIDER_SITE_OTHER): Payer: 59

## 2019-12-05 DIAGNOSIS — I10 Essential (primary) hypertension: Secondary | ICD-10-CM

## 2019-12-06 ENCOUNTER — Telehealth: Payer: Self-pay | Admitting: Cardiology

## 2019-12-06 NOTE — Telephone Encounter (Signed)
Follow up    Patient is returning call to discuss results.

## 2019-12-06 NOTE — Telephone Encounter (Signed)
I was able to get a hold of Mr. Suthers.  I explained that the left side of his heart was working well but that the right side showed some dilatation of the right ventricle.  I suspect this may be secondary to obesity and sleep apnea.  He is using his CPAP and says he is sleeping well.  Yesterday he said he had an episode where he felt like he was "shaking all over".  He thought it was from the metolazone, I am wondering if he had an episode of PAF.  His rate was not particularly fast.  In reviewing his labs I suggested he stop taking his potassium supplement, and I told him he could stop taking the metolazone and go back to Lasix 40 mg a day since the edema in his legs is gone.  He also feels like he has less fluid "in my chest".  I encouraged him to keep his follow-up as scheduled on March 5.  Kerin Ransom PA-C 12/06/2019 4:08 PM

## 2019-12-06 NOTE — Telephone Encounter (Signed)
I called the patient yesterday and today to discuss echo results- both times went to voicemail.  CRISTY ABILA PA-C 12/06/2019 3:37 PM

## 2019-12-06 NOTE — Telephone Encounter (Signed)
Pt calling to speak with you about results of Echo.

## 2019-12-17 NOTE — Progress Notes (Signed)
Cardiology Clinic Note   Patient Name: Devon Mathews. Date of Encounter: 12/21/2019  Primary Care Provider:  Haywood Pao, MD Primary Cardiologist:  Skeet Latch, MD  Patient Profile   Devon Mathews. 69 year old male presents today for follow-up evaluation of his coronary artery disease, essential hypertension, PAF, and shortness of breath.  Past Medical History    Past Medical History:  Diagnosis Date  . Atrial fibrillation ([place])   . CAD S/P percutaneous coronary angioplasty 09/13/2018    99% p-mLAD (BTW d1&d2) -> SYNERGY DES 3.5 X 12 (3.75 mm). 20% LM.  Cx, small RI & co-dom RCA normal.  EF 65%.   . Dizziness 2012   normal findings..  . Essential hypertension 07/28/2016  . Lower extremity edema 07/28/2016  . NSTEMI (non-ST elevated myocardial infarction) ([place]) 09/13/2018   The patient presented 09/13/2018 with a non-ST elevation MI, troponin peak was 0.15  . Obesity   . Obesity (BMI 30-39.9) 07/28/2016  . OSA (obstructive sleep apnea) 07/28/2016  . Restless leg syndrome    mild  . Shortness of breath 07/28/2016   Past Surgical History:  Procedure Laterality Date  . CORONARY BALLOON ANGIOPLASTY N/A 12/05/2018   Procedure: CORONARY BALLOON ANGIOPLASTY;  Surgeon: [name], MD;  Location: [facility];  Service: Cardiovascular;  Laterality: N/A;  . CORONARY STENT INTERVENTION N/A 09/13/2018   Procedure: CORONARY STENT INTERVENTION;  Surgeon: Belva Crome, MD;  Location: [facility];  Service: Cardiovascular;; p-m LAD (btw D1&D2) - SYNERGY DES 3.5 x 12 (3.75 mm).  Marland Kitchen KNEE ARTHROSCOPY Right   . LEFT HEART CATH AND CORONARY ANGIOGRAPHY N/A 09/13/2018   Procedure: LEFT HEART CATH AND CORONARY ANGIOGRAPHY;  Surgeon: Belva Crome, MD;  Location: [facility];  Service: Cardiovascular;;  99% p-mLAD (BTW d1&d2) -> SYNERGY DES 3.5 X 12 (3.75 mm). 20% LM.  Cx, small RI & co-dom RCA normal.  EF 65%.   Marland Kitchen LEFT HEART CATH AND CORONARY ANGIOGRAPHY   2003   30-40% proximal segmental stenosis in the LAD  . LEFT HEART CATH AND CORONARY ANGIOGRAPHY  2006   EF >50% & no mitral regurgitation  . LEFT HEART CATH AND CORONARY ANGIOGRAPHY N/A 12/05/2018   Procedure: LEFT HEART CATH AND CORONARY ANGIOGRAPHY;  Surgeon: [name], MD;  Location: [facility];  Service: Cardiovascular;  Laterality: N/A;  . NM MYOVIEW LTD  07/2016   Normal.  EF 55-65% 63%).  No ischemia or infarction.  LOW RISK  . NM MYOVIEW LTD  11/2018   6: 34 min.  7.2 METS.  Noted chest pain and tightness.  Horizontal ST of T wave depression (2 mm) II, 3, V5 and V6 (HIGH RISK), large/severe defect basal-apical inferior, inferolateral wall.  Large-severe basal-apical anterior-anterolateral wall.  Medium/moderate defect in basal and mid inferoseptal wall.  Consistent with large prior infarct with peri-infarct ischemia.  HIGH RISK  . PERICARDIOCENTESIS N/A 12/05/2018   Procedure: PERICARDIOCENTESIS;  Surgeon: [name], MD;  Location: [facility];  Service: Cardiovascular;  Laterality: N/A;  . SUBXYPHOID PERICARDIAL WINDOW N/A 12/25/2018   Procedure: SUBXYPHOID PERICARDIAL WINDOW;  Surgeon: [name], MD;  Location: [facility];  Service: Thoracic;  Laterality: N/A;  . TEE WITHOUT CARDIOVERSION N/A 12/25/2018   Procedure: TRANSESOPHAGEAL ECHOCARDIOGRAM (TEE);  Surgeon: [name], MD;  Location: [facility];  Service: Thoracic;  Laterality: N/A;  . TRANSTHORACIC ECHOCARDIOGRAM  07/2016   EF 60-65% with mild  LVH.  Mild AI.  Trivial MR and TR.    Allergies  Allergies  Allergen Reactions  . Penicillins Hives and Nausea And Vomiting    Did it involve swelling of the face/tongue/throat, SOB, or low BP? No Did it involve sudden or severe rash/hives, skin peeling, or any reaction on the inside of your mouth or nose? No Did you need to seek medical attention at a hospital or doctor's office? No When did it last happen?30 + years If all above answers are "NO",  may proceed with cephalosporin use.     History of Present Illness    Mr. [name] has a PMH of coronary artery disease, paroxysmal atrial fibrillation, TIA, obesity, and obstructive sleep apnea.  He was hospitalized at [facility] 11/19 with non-STEMI and a troponin peak of 0.15.  He underwent a diagnostic cardiac catheterization and LAD stenting 09/14/2018.  His cardiac catheterization also showed 20% left main narrowing but no other significant stenosis.  His LV function was normal.  February 2020 he developed chest pain as an outpatient that was concerning for angina.  A nuclear stress test 12/01/2018 was abnormal.  He was admitted for a diagnostic cardiac catheterization 12/05/2018.  His cardiac catheterization at that time showed a 95% jailed DX 1, and a patent previously placed LAD stent.  He underwent PCI to his DX which was complicated by vessel perforation and pericardial effusion.  He was discharged with plans for medical therapy but ended up in atrial fibrillation with CHF and had a TIA on 12/2018.  He underwent a pericardial window 12/25/2018.    He was last seen by Kerin Ransom, PA-C 11/30/2019.  During that time he stated he unfortunately lost his job  due to the COVID-19 pandemic and his wife was furloughed.  When asked about need for financial assistance he stated they were doing okay.  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  He admittedly stated that his diet was not always healthy due to his frequent travel with his new job.  He stated he had some lower extremity edema and fullness in his chest.  His weight had increased to 284 pounds.  In November he weighed 226 pounds.  His Lasix was increased to 80 mg x 3 days then return back to 40 mg daily.  He was also instructed to take his metolazone 30 minutes before his Lasix.  His echocardiogram on 12/03/2019 showed LVEF of 60 to 65% and RV enlargement, with mildly decreased RV systolic function.    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  He states that he will walk around his vehicle several times a day for exercise.  However, he feels that since starting this job he has gotten substantially less exercise.  Also due to poor food options he has been eating a higher sodium diet.  He feels that he did not produce much urine with his higher doses of Lasix.  I will change his Lasix to torsemide, order a BMP today and in 1 week.  I will also give him salty 6 handout and encourage daily weights.  Follow-up in 1 week.  Today he denies chest pain, increased shortness of breath,  fatigue, palpitations, melena, hematuria, hemoptysis, diaphoresis, weakness, presyncope, syncope,  and PND.   Home Medications    Prior to  Admission medications   Medication Sig Start Date End Date Taking? Authorizing Provider  Acetaminophen (TYLENOL PO) Take 2 tablets by mouth as needed (help releive pressure in his chest).    [provider]  amitriptyline (ELAVIL) 25 MG tablet Take 12.5 mg by mouth at bedtime as needed for sleep.     [provider]  atorvastatin (LIPITOR) 80 MG tablet TAKE 1 TABLET BY MOUTH EVERY DAY AT 6PM 11/19/19   Erlene Quan, PA-C  clopidogrel (PLAVIX) 75 MG tablet TAKE 1 TABLET BY MOUTH EVERY DAY 09/04/19   Kilroy, Luke K, PA-C  ELIQUIS 5 MG TABS tablet Take 5 mg by mouth 2 (two) times daily. 12/18/18   [provider]  furosemide (LASIX) 40 MG tablet Take 1 tablet (40 mg total) by mouth daily. 11/15/19   Skeet Latch, MD  metolazone (ZAROXOLYN) 2.5 MG tablet TAKE AS DIRECTED 11/30/19   Erlene Quan, PA-C  metoprolol succinate (TOPROL-XL) 50 MG 24 hr tablet TAKE 1 TABLET BY MOUTH EVERY DAY WITH OR IMMEDIATELY FOLLOWING A MEAL 11/20/19   Skeet Latch, MD  nitroGLYCERIN (NITROSTAT) 0.4 MG SL tablet Place 1 tablet (0.4 mg total) under the tongue every  5 (five) minutes as needed for chest pain. 10/30/19   Erlene Quan, PA-C  pantoprazole (PROTONIX) 40 MG tablet TAKE ONE TABLET BY MOUTH ONE TIME DAILY. must make follow up appointment for further refills Patient taking differently: Take 40 mg by mouth at bedtime.  10/07/14   [name], MD  potassium chloride (K-DUR,KLOR-CON) 10 MEQ tablet Take 10 mEq by mouth daily.    [provider]    Family History    Family History  Problem Relation Age of Onset  . Heart failure Mother   . Other Father        bypass surgery  . Heart disease Father   . Heart failure Father   . COPD Father   . Heart attack Sister   . Diabetes Sister   . Heart failure Sister   . Diabetes Brother   . Other Maternal Grandfather        smoking related disease  . Heart attack Paternal Grandmother    He indicated that his mother is deceased. He indicated that his father is alive. He indicated that his sister is alive. He indicated that his brother is alive. He indicated that his maternal grandmother is deceased. He indicated that his maternal grandfather is deceased. He indicated that his paternal grandmother is deceased. He indicated that his paternal grandfather is deceased.  Social History    Social History   Socioeconomic History  . Marital status: Married    Spouse name: Not on file  . Number of children: Not on file  . Years of education: Not on file  . Highest education level: Not on file  Occupational History  . Not on file  Tobacco Use  . Smoking status: Never Smoker  . Smokeless tobacco: Never Used  Substance and Sexual Activity  . Alcohol use: No  . Drug use: No  . Sexual activity: Not on file  Other Topics Concern  . Not on file  Social History Narrative  . Not on file   Social Determinants of Health   Financial Resource Strain:   . Difficulty of Paying Living Expenses: Not on file  Food Insecurity:   . Worried About Charity fundraiser in the Last Year: Not on file  .  Ran Out of Food in the  Last Year: Not on file  Transportation Needs:   . Lack of Transportation (Medical): Not on file  . Lack of Transportation (Non-Medical): Not on file  Physical Activity:   . Days of Exercise per Week: Not on file  . Minutes of Exercise per Session: Not on file  Stress:   . Feeling of Stress : Not on file  Social Connections:   . Frequency of Communication with Friends and Family: Not on file  . Frequency of Social Gatherings with Friends and Family: Not on file  . Attends Religious Services: Not on file  . Active Member of Clubs or Organizations: Not on file  . Attends Archivist Meetings: Not on file  . Marital Status: Not on file  Intimate Partner Violence:   . Fear of Current or Ex-Partner: Not on file  . Emotionally Abused: Not on file  . Physically Abused: Not on file  . Sexually Abused: Not on file     Review of Systems    General:  No chills, fever, night sweats or weight changes.  Cardiovascular:  No chest pain, dyspnea on exertion, edema, orthopnea, palpitations, paroxysmal nocturnal dyspnea. Dermatological: No rash, lesions/masses Respiratory: No cough, dyspnea Urologic: No hematuria, dysuria Abdominal:   No nausea, vomiting, diarrhea, bright red blood per rectum, melena, or hematemesis Neurologic:  No visual changes, wkns, changes in mental status. All other systems reviewed and are otherwise negative except as noted above.  Physical Exam    VS:  BP 138/70   Pulse 72   Ht 5\' 9"  (1.753 m)   Wt 284 lb 12.8 oz (129.2 kg)   SpO2 98%   BMI 42.06 kg/m  , BMI Body mass index is 42.06 kg/m. GEN: Well nourished, well developed, in no acute distress. HEENT: normal. Neck: Supple, no JVD, carotid bruits, or masses. Cardiac: RRR, no murmurs, rubs, or gallops. No clubbing, cyanosis, +2 pitting bilateral lower extremity edema to the knee.  Radials/DP/PT 2+ and equal bilaterally.  Respiratory:  Respirations regular and unlabored, clear to  auscultation bilaterally. GI: Soft, nontender, nondistended, BS + x 4. MS: no deformity or atrophy. Skin: warm and dry, no rash. Neuro:  Strength and sensation are intact. Psych: Normal affect.  Accessory Clinical Findings    ECG personally reviewed by me today-none today   EKG 12/05/2019 Normal sinus rhythm 68 bpm   Echocardiogram 12/03/2019 IMPRESSIONS    1. Left ventricular ejection fraction, by estimation, is 60 to 65%. The  left ventricle has normal function. The left ventricle has no regional  wall motion abnormalities. There is moderate left ventricular hypertrophy.  Left ventricular diastolic  parameters were normal.  2. Compared to echo done in June 202 RV remains enlarged somewhat unusual  area of caclification in the RV apex and apical RV pericardium . Right  ventricular systolic function is mildly reduced. The right ventricular  size is moderately enlarged.  3. The mitral valve is normal in structure and function. No evidence of  mitral valve regurgitation. No evidence of mitral stenosis.  4. The aortic valve is tricuspid. Aortic valve regurgitation is trivial.  sclerosis without stenosis.  5. The inferior vena cava is normal in size with greater than 50%  respiratory variability, suggesting right atrial pressure of 3 mmHg.   Cardiac catheterization 12/05/2018  Previously placed Prox LAD drug eluting stent is widely patent.  Ost 1st Diag lesion is 95% stenosed.  Scoring balloon angioplasty was performed using a BALLOON WOLVERINE 2.50X10.  Post intervention, there is  a 20% residual stenosis.  The left ventricular systolic function is normal. The left ventricular ejection fraction is 55-65% by visual estimate.  Successful occlusion of distal diagonal branch with prolonged balloon inflations and protamine infusion after initial wire related perforation of the distal diagonal branch.  1st Diag lesion is 100% stenosed.   SUMMARY  Culprit lesion:   90%  ostial 1st Diag (jailed) -successful scoring balloon angioplasty using 2.5 mm Wolverine scoring balloon  --Distal diagonal wire perforation with pericardial effusion and initial stages of pericardial tamponade --> successful occlusion with prolonged balloon inflations and administration of protamine  --Unsuccessful pericardiocentesis  Widely patent LAD stent  Otherwise stable coronary arteries.  Normal LVEF and EDP pre-PCI  Clear ST elevations for roughly 15 minutes, most likely type for MI.   RECOMMENDATIONS  Admit to CCU on Levophed --continue Levophed overnight  Monitor closely for signs of tamponade.  Follow troponins for likely type IV MI  Stat echocardiogram ordered.  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  Diagnostic Dominance: Right  Intervention     Assessment & Plan   1.  Acute on chronic diastolic CHF-+2 pitting bilateral lower extremity edema.  Weight today 284 pounds up from 266 on 08/30/2019 Stop furosemide 40 mg daily Start torsemide 40mg  x 3 days then take 20 mg daily Continue metoprolol succinate 50 mg daily Repeat BMP today and in 1 week Daily weights Heart healthy low-sodium diet-salty 6 given Increase physical activity as tolerated Lower extremity support stockings-handout given  Coronary artery disease-no chest pain today.  Cardiac catheterization 12/05/2018 with PCI/angioplasty of DX 1.  Procedure was complicated by vessel perforation, tamponade, cardiogenic shock and acute kidney injury.He was discharged with plans for medical therapy but ended up in atrial fibrillation with CHF and had a TIA on 12/2018.  He underwent a pericardial window 12/25/2018.   Continue atorvastatin 80 mg tablet daily Continue clopidogrel 75 mg tablet daily Continue metoprolol succinate 50 mg tablet daily Continue nitroglycerin 0.4 mg  sublingual as needed Heart healthy low-sodium diet Increase physical activity as tolerated  Hypertension-BP today 138/70.  Well-controlled at home Continue metoprolol succinate 50 mg tablet daily Heart healthy low-sodium diet Increase physical activity as tolerated  Paroxysmal atrial fibrillation-heart rate today 72.   Continue Eliquis 5 mg twice daily Avoid triggers caffeine, chocolate, EtOH etc.   Obstructive sleep apnea-continues to be compliant with CPAP Followed by Dr. Claiborne Billings.  Disposition: Follow-up with me in 1 week.  Jossie Ng. [facility] [place] [address] Office [phone] Fax [phone]

## 2019-12-21 ENCOUNTER — Encounter: Payer: Self-pay | Admitting: General Practice

## 2019-12-21 ENCOUNTER — Ambulatory Visit (INDEPENDENT_AMBULATORY_CARE_PROVIDER_SITE_OTHER): Payer: 59 | Admitting: General Practice

## 2019-12-21 ENCOUNTER — Other Ambulatory Visit: Payer: Self-pay

## 2019-12-21 VITALS — BP 138/70 | HR 72 | Ht 69.0 in | Wt 284.8 lb

## 2019-12-21 DIAGNOSIS — I480 Paroxysmal atrial fibrillation: Secondary | ICD-10-CM

## 2019-12-21 DIAGNOSIS — I2510 Atherosclerotic heart disease of native coronary artery without angina pectoris: Secondary | ICD-10-CM

## 2019-12-21 DIAGNOSIS — I5033 Acute on chronic diastolic (congestive) heart failure: Secondary | ICD-10-CM | POA: Diagnosis not present

## 2019-12-21 DIAGNOSIS — G4733 Obstructive sleep apnea (adult) (pediatric): Secondary | ICD-10-CM

## 2019-12-21 DIAGNOSIS — I10 Essential (primary) hypertension: Secondary | ICD-10-CM

## 2019-12-21 DIAGNOSIS — Z9861 Coronary angioplasty status: Secondary | ICD-10-CM

## 2019-12-21 DIAGNOSIS — Z79899 Other long term (current) drug therapy: Secondary | ICD-10-CM

## 2019-12-21 MED ORDER — TORSEMIDE 20 MG PO TABS: 20.0000 mg | ORAL_TABLET | Freq: Every day | ORAL | 3 refills | Status: DC

## 2019-12-21 NOTE — Patient Instructions (Addendum)
Medication Instructions:  STOP LASIX  START TORSEMIDE 40MG  DAILY x3 DAYS THEN TAKE 20MG  DAILY If you need a refill on your cardiac medications before your next appointment, please call your pharmacy.  Labwork: BMET TODAY HERE IN OUR OFFICE AT [facility]    If you have labs (blood work) drawn today and your tests are completely normal, you will receive your results only by: Marland Kitchen MyChart Message (if you have MyChart) OR A paper copy in the mail If you have any lab test that is abnormal or we need to change your treatment, we will call you to review these results. You may go to any [facility] lab that is convenient for you however, we do have a lab in our office that is able to assist you. You do NOT need an appointment for our lab. Once in our office in our office lobby there is a podium where you sign-in and ring the doorbell to alert [place] that you are here. Lab is open 8:00am and closes at 4:00pm; closes for lunch from 12:45 - 1:45pm. PLEASE BRING A COPY OF YOUR INSURANCE CARD WITH YOU.  Special Instructions: PLEASE PURCHASE AND WEAR COMPRESSION STOCKINGS DAILY AND OFF AT BEDTIME. Compression stockings are elastic socks that squeeze the legs. They help to increase blood flow to the legs and to decrease swelling in the legs from fluid retention, and reduce the chance of developing blood clots in the lower legs. SEE ATTACHED FOR WHERE TO BUY  PLEASE READ AND FOLLOW SALTY 6 ATTACHED  Follow-Up: 1 WEEK  In Person JESSE CLEAVER FNP-C.    At [facility], you and your health needs are our priority.  As part of our continuing mission to provide you with exceptional heart care, we have created designated Provider Care Teams.  These Care Teams include your primary Cardiologist (physician) and Advanced Practice Providers (APPs -  Physician Assistants and Nurse Practitioners) who all work together to provide you with the care you need, when you need it.  Reduce your risk of getting COVID-19 With your heart  disease it is especially important for people at increased risk of severe illness from COVID-19, and those who live with them, to protect themselves from getting COVID-19. The best way to protect yourself and to help reduce the spread of the virus that causes COVID-19 is to: Marland Kitchen Limit your interactions with other people as much as possible. . Take COVID-19 when you do interact with others. If you start feeling sick and think you may have COVID-19, get in touch with your healthcare provider within 24 hours.  Thank you for choosing [facility] at [facility]!!

## 2019-12-22 LAB — BASIC METABOLIC PANEL
BUN/Creatinine Ratio: 12 (ref 10–24)
BUN: 14 mg/dL (ref 8–27)
CO2: 23 mmol/L (ref 20–29)
Calcium: 9.3 mg/dL (ref 8.6–10.2)
Chloride: 103 mmol/L (ref 96–106)
Creatinine, Ser: 1.18 mg/dL (ref 0.76–1.27)
GFR calc Af Amer: 73 mL/min/{1.73_m2} (ref >59)
GFR calc non Af Amer: 63 mL/min/{1.73_m2} (ref >59)
Glucose: 125 mg/dL — ABNORMAL HIGH (ref 65–99)
Potassium: 4.7 mmol/L (ref 3.5–5.2)
Sodium: 140 mmol/L (ref 134–144)
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
## 2019-12-27 ENCOUNTER — Encounter: Payer: Self-pay | Admitting: General Practice

## 2019-12-27 ENCOUNTER — Other Ambulatory Visit: Payer: Self-pay | Admitting: *Deleted

## 2019-12-27 ENCOUNTER — Telehealth (INDEPENDENT_AMBULATORY_CARE_PROVIDER_SITE_OTHER): Payer: 59 | Admitting: General Practice

## 2019-12-27 VITALS — BP 168/75 | HR 73 | Ht 69.0 in

## 2019-12-27 DIAGNOSIS — I5033 Acute on chronic diastolic (congestive) heart failure: Secondary | ICD-10-CM | POA: Diagnosis not present

## 2019-12-27 DIAGNOSIS — I2510 Atherosclerotic heart disease of native coronary artery without angina pectoris: Secondary | ICD-10-CM

## 2019-12-27 DIAGNOSIS — I480 Paroxysmal atrial fibrillation: Secondary | ICD-10-CM | POA: Diagnosis not present

## 2019-12-27 DIAGNOSIS — I10 Essential (primary) hypertension: Secondary | ICD-10-CM

## 2019-12-27 DIAGNOSIS — G4733 Obstructive sleep apnea (adult) (pediatric): Secondary | ICD-10-CM

## 2019-12-27 LAB — BASIC METABOLIC PANEL
BUN/Creatinine Ratio: 16 (ref 10–24)
BUN: 19 mg/dL (ref 8–27)
CO2: 24 mmol/L (ref 20–29)
Calcium: 8.9 mg/dL (ref 8.6–10.2)
Chloride: 103 mmol/L (ref 96–106)
Creatinine, Ser: 1.17 mg/dL (ref 0.76–1.27)
GFR calc Af Amer: 74 mL/min/{1.73_m2} (ref >59)
GFR calc non Af Amer: 64 mL/min/{1.73_m2} (ref >59)
Glucose: 150 mg/dL — ABNORMAL HIGH (ref 65–99)
Potassium: 4.6 mmol/L (ref 3.5–5.2)
Sodium: 140 mmol/L (ref 134–144)

## 2019-12-27 MED ORDER — POTASSIUM CHLORIDE ER 10 MEQ PO TBCR: 10.0000 meq | EXTENDED_RELEASE_TABLET | Freq: Every day | ORAL | 1 refills | Status: DC

## 2019-12-27 NOTE — Patient Instructions (Signed)
Medication Instructions:  CONTINUE TORSEMIDE 20MG  DAILY  POTASSIUM 10MEQ-TAKE ONLY WITH EXTRA DOSE OF TORSEMIDE FOR INCREASED >3#DAY/5#WEEK If you need a refill on your cardiac medications before your next appointment, please call your pharmacy.  Special Instructions: CONTINUE LOW SALT DIET AND SALTY 6 RECOMMENDATIONS AS DISCUSS AT LAST VISIT  CONTINUE DAILY WEIGHTS-YOU MAY TAKE ADDITIONAL TORSEMIDE AND POTASSIUM, IF YOUR WEIGHT IS >3 POUNDS DAILY OR >5 POUNDS IN ONE WEEK. PLEASE CALL OUR OFFICE TO LET us KNOW IF YOU HAVE INCREASED THIS MEDICATION OX:[phone].  Follow-Up: 1 MONTH  In Person Coletta Memos, [place].    At [facility], you and your health needs are our priority.  As part of our continuing mission to provide you with exceptional heart care, we have created designated Provider Care Teams.  These Care Teams include your primary Cardiologist (physician) and Advanced Practice Providers (APPs -  Physician Assistants and Nurse Practitioners) who all work together to provide you with the care you need, when you need it.  Reduce your risk of getting COVID-19 With your heart disease it is especially important for people at increased risk of severe illness from COVID-19, and those who live with them, to protect themselves from getting COVID-19. The best way to protect yourself and to help reduce the spread of the virus that causes COVID-19 is to: Marland Kitchen Limit your interactions with other people as much as possible. . Take COVID-19 when you do interact with others. If you start feeling sick and think you may have COVID-19, get in touch with your healthcare provider within 24 hours.  Thank you for choosing [facility] at [facility]!!

## 2020-01-18 ENCOUNTER — Telehealth: Payer: Self-pay | Admitting: Cardiovascular Disease
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
## 2020-01-20 NOTE — Telephone Encounter (Signed)
Thank you.  ~TCR

## 2020-01-21 ENCOUNTER — Telehealth: Payer: Self-pay | Admitting: Cardiovascular Disease

## 2020-01-21 NOTE — Telephone Encounter (Signed)
Attempted to contact patient to discuss-  Went to voicemail. Left message for patient to call back to discuss.

## 2020-01-21 NOTE — Telephone Encounter (Signed)
Left message to call office

## 2020-01-21 NOTE — Telephone Encounter (Signed)
  Patient is calling because he has pain moving up his leg associated with the redness on his leg. He is concerned he may have cellulitis and would like a call back to discuss if there is anything that he should be doing to care for it before his appointment on 01/24/20.

## 2020-01-21 NOTE — Telephone Encounter (Signed)
Follow up  Patient is on the line returning the missed calls. Transferred call to Devon Mathews.

## 2020-01-21 NOTE — Telephone Encounter (Signed)
Follow up  ° ° °Patient is returning call.  °

## 2020-01-21 NOTE — Telephone Encounter (Signed)
Contacted patient, advised him to also contact his PCP to see if they can see him any sooner to have someone look at his leg. Patient verbalized understanding.

## 2020-01-21 NOTE — Telephone Encounter (Signed)
OK.  Unfortunately we can't diagnose or treat this over the phone.  He may also want to see if he can be seen by his PCP.

## 2020-01-21 NOTE — Telephone Encounter (Signed)
Contacted patient- he states that he did speak with the on call provider- and they advised him to go to the urgent care on Sunday morning, so he did- he states that after waiting for hours, other people were being called before him and he got angry and left. He states that he just wants to know what to do.  I advised him to have a marker and mark the redness to see if it increases again. Also he states that he currently does not have a fever or feels bad at this time, just pain where he hit his leg last week. He states it is a raised area, but it is not hot to the touch- he states that his BP is okay, he denies any chest pain, swelling in other extremities, or SOB.  Patient was advised if the spot grew in size, if he became symptomatic (fever, pain) to go to the ER as instructed from on call physician.   Patient verbalized understanding. Does have appointment with NP 04/08
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
## 2020-01-24 ENCOUNTER — Encounter: Payer: Self-pay | Admitting: General Practice

## 2020-01-24 ENCOUNTER — Other Ambulatory Visit: Payer: Self-pay

## 2020-01-24 ENCOUNTER — Ambulatory Visit (INDEPENDENT_AMBULATORY_CARE_PROVIDER_SITE_OTHER): Payer: 59 | Admitting: General Practice

## 2020-01-24 VITALS — BP 116/60 | HR 66 | Ht 70.0 in | Wt 274.0 lb

## 2020-01-24 DIAGNOSIS — I2510 Atherosclerotic heart disease of native coronary artery without angina pectoris: Secondary | ICD-10-CM | POA: Diagnosis not present

## 2020-01-24 DIAGNOSIS — I5033 Acute on chronic diastolic (congestive) heart failure: Secondary | ICD-10-CM | POA: Diagnosis not present

## 2020-01-24 DIAGNOSIS — I10 Essential (primary) hypertension: Secondary | ICD-10-CM

## 2020-01-24 DIAGNOSIS — S8011XA Contusion of right lower leg, initial encounter: Secondary | ICD-10-CM | POA: Diagnosis not present

## 2020-01-24 DIAGNOSIS — Z79899 Other long term (current) drug therapy: Secondary | ICD-10-CM

## 2020-01-24 DIAGNOSIS — I480 Paroxysmal atrial fibrillation: Secondary | ICD-10-CM

## 2020-01-24 DIAGNOSIS — G4733 Obstructive sleep apnea (adult) (pediatric): Secondary | ICD-10-CM

## 2020-01-24 LAB — BASIC METABOLIC PANEL
BUN/Creatinine Ratio: 15 (ref 10–24)
BUN: 17 mg/dL (ref 8–27)
CO2: 24 mmol/L (ref 20–29)
Calcium: 9.1 mg/dL (ref 8.6–10.2)
Chloride: 105 mmol/L (ref 96–106)
Creatinine, Ser: 1.11 mg/dL (ref 0.76–1.27)
GFR calc Af Amer: 78 mL/min/{1.73_m2} (ref >59)
GFR calc non Af Amer: 68 mL/min/{1.73_m2} (ref >59)
Glucose: 130 mg/dL — ABNORMAL HIGH (ref 65–99)
Potassium: 4.4 mmol/L (ref 3.5–5.2)
Sodium: 142 mmol/L (ref 134–144)

## 2020-01-24 MED ORDER — TORSEMIDE 20 MG PO TABS: 20.0000 mg | ORAL_TABLET | Freq: Every day | ORAL | 3 refills | Status: DC

## 2020-01-24 MED ORDER — POTASSIUM CHLORIDE ER 10 MEQ PO TBCR: 10.0000 meq | EXTENDED_RELEASE_TABLET | Freq: Every day | ORAL | 1 refills | Status: DC | PRN

## 2020-01-24 NOTE — Patient Instructions (Signed)
Medication Instructions:  The current medical regimen is effective;  continue present plan and medications as directed. Please refer to the Current Medication list given to you today. *If you need a refill on your cardiac medications before your next appointment, please call your pharmacy*  Lab Work: BMET TODAY If you have labs (blood work) drawn today and your tests are completely normal, you will receive your results only by:  [place] (if you have MyChart) OR A paper copy in the mail.  If you have any lab test that is abnormal or we need to change your treatment, we will call you to review the results.  Follow-Up: Your next appointment:  3 month(s) Please call our office 2 months in advance to schedule this appointment In Person with Skeet Latch, MD  At [facility], you and your health needs are our priority.  As part of our continuing mission to provide you with exceptional heart care, we have created designated Provider Care Teams.  These Care Teams include your primary Cardiologist (physician) and Advanced Practice Providers (APPs -  Physician Assistants and Nurse Practitioners) who all work together to provide you with the care you need, when you need it.  We recommend signing up for the patient portal called "MyChart".  Sign up information is provided on this After Visit Summary.  MyChart is used to connect with patients for Virtual Visits (Telemedicine).  Patients are able to view lab/test results, encounter notes, upcoming appointments, etc.  Non-urgent messages can be sent to your provider as well.   To learn more about what you can do with MyChart, go to [email or web address].

## 2020-01-28 ENCOUNTER — Ambulatory Visit: Payer: PRIVATE HEALTH INSURANCE | Admitting: General Practice

## 2020-03-19 ENCOUNTER — Other Ambulatory Visit: Payer: Self-pay | Admitting: Orthopedic Surgery

## 2020-03-19 DIAGNOSIS — M25511 Pain in right shoulder: Secondary | ICD-10-CM

## 2020-03-24 ENCOUNTER — Other Ambulatory Visit: Payer: Self-pay | Admitting: Cardiology

## 2020-04-11 ENCOUNTER — Ambulatory Visit
Admission: RE | Admit: 2020-04-11 | Discharge: 2020-04-11 | Disposition: A | Discharge status: Home / Self Care | Payer: 59 | Source: Ambulatory Visit | Attending: Orthopedic Surgery | Admitting: Orthopedic Surgery

## 2020-04-11 ENCOUNTER — Other Ambulatory Visit: Payer: Self-pay

## 2020-04-11 DIAGNOSIS — M25511 Pain in right shoulder: Secondary | ICD-10-CM

## 2020-04-11 MED ORDER — IOPAMIDOL (ISOVUE-M 200) INJECTION 41%
13.0000 mL | Freq: Once | INTRAMUSCULAR | Status: AC
  Administered 2020-04-11: 13 mL via INTRA_ARTICULAR

## 2020-04-26 NOTE — Progress Notes (Signed)
Virtual Visit via Telephone Note   This visit type was conducted due to national recommendations for restrictions regarding the COVID-19 Pandemic (e.g. social distancing) in an effort to limit this patient's exposure and mitigate transmission in our community.  Due to his co-morbid illnesses, this patient is at least at moderate risk for complications without adequate follow up.  This format is felt to be most appropriate for this patient at this time.  The patient did not have access to video technology/had technical difficulties with video requiring transitioning to audio format only (telephone).  All issues noted in this document were discussed and addressed.  No physical exam could be performed with this format.  Please refer to the patient's chart for his  consent to telehealth for [facility].   Date:  04/28/2020   ID:  Devon Mathews., DOB [birth year], MRN [id number]  Patient Location: Home Provider Location: Office/Clinic  PCP:  Haywood Pao, MD  Cardiologist:  Skeet Latch, MD  Electrophysiologist:  None   Evaluation Performed:  Follow-Up Visit  Chief Complaint:  Follow Up  History of Present Illness:    Devon Mathews. is a 69 y.o. male we are following for ongoing assessment and management of coronary artery disease, paroxysmal atrial fibrillation, history of TIA, hypertension, dyspnea on exertion, with other history to include OSA and obesity.    Most recent hospitalization was in November 2019 in the setting of a non-STEMI; cardiac catheterization performed at that time revealed a 20% left main narrowing but no other significant stenosis.  LV systolic function was normal.  Follow-up cardiac catheterization in February 2020 revealed a 95% jailed diagonal 1, and patent previously placed LAD stent.  He underwent PCI to his diagonal which was complicated by vessel perforation and pericardial effusion.  He underwent a pericardial window on 12/25/2018.  The patient was also  found to have atrial fibrillation and a TIA on 01/05/2019.  He was last seen in the office on 01/24/2020 by Coletta Memos, nurse practitioner.  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  He unfortunately has not done any further exercise, and has been eating a higher sodium diet which he self admitted.  Lasix was changed to torsemide and was given information on a low-sodium diet.  On follow-up via virtual visit the patient had been feeling much better on torsemide has lost some weight, his breathing was much better and his lower extremity edema had improved significantly.  However the patient was seen in clinic on 01/24/2020 with what appeared to be cellulitis of his posterior side of his left calf.  He accidentally struck his shin with a hammer while working on his automobile.  On in person assessment by Coletta Memos, PA, he did have concerns for cellulitis of the calf.  However, it  appeared that he had a hematoma which had spread around the backside of his right calf.  He was found to be euvolemic with weight of 274 pounds down from 284 pounds in March 2021.  He was to continue atorvastatin, torsemide, clopidogrel, metoprolol, potassium, and continue daily weights and low-sodium diet.  He was also to continue anticoagulation therapy with Eliquis twice daily.  His heart rate was well controlled at that time.  On virtual visit today he complains of some pain between his shoulder blades which has  been going on for couple of weeks. It is not associated with shortness of breath, nausea vomiting, or dizziness. He is concerned about this as he has had a friend recently died from an aortic aneurysm. He denies any anterior chest pain, or other pain with radiation to his arms jaw or neck.  He is medically compliant, he requests refills on his Eliquis. He has recently had his  annual visit with Dr. Osborne Casco, his PCP with labs drawn all within normal limits. Will need to obtain copies.  The patient does not have symptoms concerning for COVID-19 infection (fever, chills, cough, or new shortness of breath).    Past Medical History:  Diagnosis Date  . Atrial fibrillation ([place])   . CAD S/P percutaneous coronary angioplasty 09/13/2018    99% p-mLAD (BTW d1&d2) -> SYNERGY DES 3.5 X 12 (3.75 mm). 20% LM.  Cx, small RI & co-dom RCA normal.  EF 65%.   . Dizziness 2012   normal findings..  . Essential hypertension 07/28/2016  . Lower extremity edema 07/28/2016  . NSTEMI (non-ST elevated myocardial infarction) ([place]) 09/13/2018   The patient presented 09/13/2018 with a non-ST elevation MI, troponin peak was 0.15  . Obesity   . Obesity (BMI 30-39.9) 07/28/2016  . OSA (obstructive sleep apnea) 07/28/2016  . Restless leg syndrome    mild  . Shortness of breath 07/28/2016   Past Surgical History:  Procedure Laterality Date  . CORONARY BALLOON ANGIOPLASTY N/A 12/05/2018   Procedure: CORONARY BALLOON ANGIOPLASTY;  Surgeon: [name], MD;  Location: [place] [facility];  Service: Cardiovascular;  Laterality: N/A;  . CORONARY STENT INTERVENTION N/A 09/13/2018   Procedure: CORONARY STENT INTERVENTION;  Surgeon: Belva Crome, MD;  Location: [place] [facility];  Service: Cardiovascular;; p-m LAD (btw D1&D2) - SYNERGY DES 3.5 x 12 (3.75 mm).  Marland Kitchen KNEE ARTHROSCOPY Right   . LEFT HEART CATH AND CORONARY ANGIOGRAPHY N/A 09/13/2018   Procedure: LEFT HEART CATH AND CORONARY ANGIOGRAPHY;  Surgeon: Belva Crome, MD;  Location: [place] [facility];  Service: Cardiovascular;;  99% p-mLAD (BTW d1&d2) -> SYNERGY DES 3.5 X 12 (3.75 mm). 20% LM.  Cx, small RI & co-dom RCA normal.  EF 65%.   Marland Kitchen LEFT HEART CATH AND CORONARY ANGIOGRAPHY  2003   30-40% proximal segmental stenosis in the LAD  . LEFT HEART CATH AND CORONARY ANGIOGRAPHY  2006   EF >50% & no mitral regurgitation  . LEFT HEART  CATH AND CORONARY ANGIOGRAPHY N/A 12/05/2018   Procedure: LEFT HEART CATH AND CORONARY ANGIOGRAPHY;  Surgeon: [name], MD;  Location: [place] [facility];  Service: Cardiovascular;  Laterality: N/A;  . NM MYOVIEW LTD  07/2016   Normal.  EF 55-65% 63%).  No ischemia or infarction.  LOW RISK  . NM MYOVIEW LTD  11/2018   6: 34 min.  7.2 METS.  Noted chest pain and tightness.  Horizontal ST of T wave depression (2 mm) II, 3, V5 and V6 (HIGH RISK), large/severe defect basal-apical inferior, inferolateral wall.  Large-severe basal-apical anterior-anterolateral wall.  Medium/moderate defect in basal and mid inferoseptal wall.  Consistent with large prior infarct with peri-infarct ischemia.  HIGH RISK  . PERICARDIOCENTESIS N/A 12/05/2018   Procedure: PERICARDIOCENTESIS;  Surgeon: [name], MD;  Location: [place] [facility];  Service: Cardiovascular;  Laterality: N/A;  . SUBXYPHOID PERICARDIAL WINDOW N/A 12/25/2018   Procedure: SUBXYPHOID PERICARDIAL WINDOW;  Surgeon: [name], MD;  Location: [place];  Service:  Thoracic;  Laterality: N/A;  . TEE WITHOUT CARDIOVERSION N/A 12/25/2018   Procedure: TRANSESOPHAGEAL ECHOCARDIOGRAM (TEE);  Surgeon: [name], MD;  Location: [facility];  Service: Thoracic;  Laterality: N/A;  . TRANSTHORACIC ECHOCARDIOGRAM  07/2016   EF 60-65% with mild LVH.  Mild AI.  Trivial MR and TR.     No outpatient medications have been marked as taking for the 04/28/20 encounter (Telemedicine) with Lendon Colonel, NP.     Allergies:   Penicillins   Social History   Tobacco Use  . Smoking status: Never Smoker  . Smokeless tobacco: Never Used  Vaping Use  . Vaping Use: Never used  Substance Use Topics  . Alcohol use: No  . Drug use: No     Family Hx: The patient's family history includes COPD in his father; Diabetes in his brother and sister; Heart attack in his paternal grandmother and sister; Heart disease in his father; Heart failure in his father,  mother, and sister; Other in his father and maternal grandfather.  ROS:   Please see the history of present illness.    All other systems reviewed and are negative.   Prior CV studies:   The following studies were reviewed today: EKG 2019/12/07 Normal sinus rhythm 68 bpm   Echocardiogram 12/03/2019 IMPRESSIONS  1. Left ventricular ejection fraction, by estimation, is 60 to 65%. The  left ventricle has normal function. The left ventricle has no regional  wall motion abnormalities. There is moderate left ventricular hypertrophy.  Left ventricular diastolic  parameters were normal.  2. Compared to echo done in June 202 RV remains enlarged somewhat unusual  area of caclification in the RV apex and apical RV pericardium . Right  ventricular systolic function is mildly reduced. The right ventricular  size is moderately enlarged.  3. The mitral valve is normal in structure and function. No evidence of  mitral valve regurgitation. No evidence of mitral stenosis.  4. The aortic valve is tricuspid. Aortic valve regurgitation is trivial.  sclerosis without stenosis.  5. The inferior vena cava is normal in size with greater than 50%  respiratory variability, suggesting right atrial pressure of 3 mmHg.   Cardiac catheterization 12/05/2018  Previously placed Prox LAD drug eluting stent is widely patent.  Ost 1st Diag lesion is 95% stenosed.  Scoring balloon angioplasty was performed using a BALLOON WOLVERINE 2.50X10.  Post intervention, there is a 20% residual stenosis.  The left ventricular systolic function is normal. The left ventricular ejection fraction is 55-65% by visual estimate.  Successful occlusion of distal diagonal branch with prolonged balloon inflations and protamine infusion after initial wire related perforation of the distal diagonal branch.  1st Diag lesion is 100% stenosed.  SUMMARY  Culprit lesion:   90% ostial 1st Diag (jailed) -successful scoring  balloon angioplasty using 2.5 mm Wolverine scoring balloon  --Distal diagonal wire perforation with pericardial effusion and initial stages of pericardial tamponade --> successful occlusion with prolonged balloon inflations and administration of protamine  --Unsuccessful pericardiocentesis  Widely patent LAD stent  Otherwise stable coronary arteries.  Normal LVEF and EDP pre-PCI  Clear ST elevations for roughly 15 minutes, most likely type for MI.   RECOMMENDATIONS  Admit to CCU on Levophed--continue Levophed overnight  Monitor closely for signs of tamponade. Follow troponins for likely type IV MI  Stat echocardiogram ordered.  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  Diagnostic Dominance: Right  Intervention     Labs/Other Tests and Data Reviewed:    EKG:  No ECG reviewed.  Recent Labs: 11/30/2019: NT-Pro BNP 158 01/24/2020: BUN 17; Creatinine, Ser 1.11; Potassium 4.4; Sodium 142   Recent Lipid Panel Lab Results  Component Value Date/Time   CHOL 79 12/21/2018 05:28 AM   TRIG 54 12/21/2018 05:28 AM   HDL 26 (L) 12/21/2018 05:28 AM   CHOLHDL 3.0 12/21/2018 05:28 AM   LDLCALC 42 12/21/2018 05:28 AM    Wt Readings from Last 3 Encounters:  04/28/20 270 lb (122.5 kg)  01/24/20 274 lb (124.3 kg)  12/21/19 284 lb 12.8 oz (129.2 kg)     Objective:    Vital Signs:  BP (!) 149/80   Pulse 65   Ht 5\' 10"  (1.778 m)   Wt 270 lb (122.5 kg)   BMI 38.74 kg/m  Limited assessment as this is over the phone.  VITAL SIGNS:  reviewed GEN:  no acute distress RESPIRATORY:  normal respiratory effort, symmetric expansion NEURO:  alert and oriented x 3, no obvious focal deficit PSYCH:  normal affect  ASSESSMENT & PLAN:    1. Coronary artery disease: History of stent to the LAD and diagonal. I have reviewed his cardiac catheterization  for an echocardiogram. There is no evidence of aortic dilatation or aneurysm in any of the reports. I have reviewed this with him and given him reassurance. However I did let him know that if he has worsening pain between his shoulder blades with associated symptoms of dyspnea, diaphoresis, or radiation he is to let us know.  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 This may be related to musculoskeletal. We will continue to monitor his symptoms. He knows to call us if things worsen.  2. Hypertension: Blood pressure is elevated today but he is not yet taken his medications and rush to be available for this appointment. We will not make any changes at this time as prior blood pressures have all been well controlled on his medication.  3. Paroxysmal atrial fibrillation: He remains on Eliquis. Refills are provided. He denies any palpitations or heart racing. He continues on torsemide for fluid retention. He states that he has not getting very good response from torsemide.  4. History of TIA: He denies any new symptoms.  5. OSA: Compliant with CPAP.  COVID-19 Education: The signs and symptoms of COVID-19 were discussed with the patient and how to seek care for testing (follow up with PCP or arrange E-visit).  The importance of social distancing was discussed today.  Time:   Today, I have spent 20 minutes with the patient with telehealth technology discussing the above problems.     Medication Adjustments/Labs and Tests Ordered: Current medicines are reviewed at length with the patient today.  Concerns regarding medicines are outlined above.   Tests Ordered: No orders of the defined types were placed in this encounter.   Medication Changes: No orders of the defined types were placed in this encounter.   Disposition:  Follow up 6 months   Signed, Phill Myron. West Pugh, ANP, AACC  04/28/2020 8:05 AM    [facility]  [facility]

## 2020-04-28 ENCOUNTER — Encounter: Payer: Self-pay | Admitting: Adult Health

## 2020-04-28 ENCOUNTER — Telehealth (INDEPENDENT_AMBULATORY_CARE_PROVIDER_SITE_OTHER): Payer: 59 | Admitting: Adult Health

## 2020-04-28 VITALS — BP 149/80 | HR 65 | Ht 70.0 in | Wt 270.0 lb

## 2020-04-28 DIAGNOSIS — G4733 Obstructive sleep apnea (adult) (pediatric): Secondary | ICD-10-CM

## 2020-04-28 DIAGNOSIS — I480 Paroxysmal atrial fibrillation: Secondary | ICD-10-CM

## 2020-04-28 DIAGNOSIS — Z8673 Personal history of transient ischemic attack (TIA), and cerebral infarction without residual deficits: Secondary | ICD-10-CM | POA: Diagnosis not present

## 2020-04-28 DIAGNOSIS — I10 Essential (primary) hypertension: Secondary | ICD-10-CM

## 2020-04-28 DIAGNOSIS — I5032 Chronic diastolic (congestive) heart failure: Secondary | ICD-10-CM | POA: Diagnosis not present

## 2020-04-28 DIAGNOSIS — I2510 Atherosclerotic heart disease of native coronary artery without angina pectoris: Secondary | ICD-10-CM

## 2020-04-28 DIAGNOSIS — E669 Obesity, unspecified: Secondary | ICD-10-CM

## 2020-04-28 MED ORDER — ELIQUIS 5 MG PO TABS: 5.0000 mg | ORAL_TABLET | Freq: Two times a day (BID) | ORAL | 3 refills | Status: DC

## 2020-04-28 NOTE — Patient Instructions (Signed)
Medication Instructions:  Continue continue current medications  *If you need a refill on your cardiac medications before your next appointment, please call your pharmacy*   Lab Work: None Ordered   Testing/Procedures: None Ordered   Follow-Up: At [facility], you and your health needs are our priority.  As part of our continuing mission to provide you with exceptional heart care, we have created designated Provider Care Teams.  These Care Teams include your primary Cardiologist (physician) and Advanced Practice Providers (APPs -  Physician Assistants and Nurse Practitioners) who all work together to provide you with the care you need, when you need it.  We recommend signing up for the patient portal called "MyChart".  Sign up information is provided on this After Visit Summary.  MyChart is used to connect with patients for Virtual Visits (Telemedicine).  Patients are able to view lab/test results, encounter notes, upcoming appointments, etc.  Non-urgent messages can be sent to your provider as well.   To learn more about what you can do with MyChart, go to [email or web address].    Your next appointment:   6 month(s)  The format for your next appointment:   In Person  Provider:   You may see Skeet Latch, MD or one of the following Advanced Practice Providers on your designated Care Team:    Kvion Shapley, PA-C  [place], [place]  Coletta Memos, [place]

## 2020-06-10 ENCOUNTER — Other Ambulatory Visit: Payer: Self-pay | Admitting: General Practice

## 2020-06-24 ENCOUNTER — Telehealth: Payer: Self-pay | Admitting: Cardiovascular Disease

## 2020-06-24 NOTE — Telephone Encounter (Signed)
Pt c/o BP issue: STAT if pt c/o blurred vision, one-sided weakness or slurred speech  1. What are your last 5 BP readings?  190/78  2. Are you having any other symptoms (ex. Dizziness, headache, blurred vision, passed out)? Was feeling dizzy today, still is.  Gets SOB with activity.   3. What is your BP issue? Has not been checking BP on the regular, he states it has been fine, but today he was feeling dizzy.
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
## 2020-06-24 NOTE — Telephone Encounter (Signed)
Woke up dizzy this morning.  This is new so checked BP.  190/78.  Yesterday took one extra fluid pill because he has gained weight (does not weigh daily and does not note swelling) and feeling chest pressure and difficulty breathing after walking short distance. Seemed to help.  Felt lighter.  Symptoms going on about a month or so but seems to be getting worse.  Heat makes these symptoms worse.  Resolve after resting a couple minutes.   Reviewed meds.  Taking as listed.  Recheck of BP on the phone is 178/73, hr 75. Discussed diet and choosing foods low in sodium.  He will monitor BP and do daily weights.  Discussed using ntg if chest pressure does not resolve w rest.   Scheduled this pt for clinic appointment tomorrow with APP.  He is appreciative for this and will bring his BP cuff with him.

## 2020-06-25 ENCOUNTER — Encounter: Payer: Self-pay | Admitting: General Practice

## 2020-06-25 ENCOUNTER — Ambulatory Visit: Payer: Medicare HMO | Admitting: General Practice

## 2020-06-25 ENCOUNTER — Other Ambulatory Visit: Payer: Self-pay

## 2020-06-25 VITALS — BP 148/68 | HR 69 | Temp 98.2°F | Ht 69.0 in | Wt 281.0 lb

## 2020-06-25 DIAGNOSIS — M549 Dorsalgia, unspecified: Secondary | ICD-10-CM | POA: Diagnosis not present

## 2020-06-25 DIAGNOSIS — I2510 Atherosclerotic heart disease of native coronary artery without angina pectoris: Secondary | ICD-10-CM

## 2020-06-25 DIAGNOSIS — I10 Essential (primary) hypertension: Secondary | ICD-10-CM

## 2020-06-25 DIAGNOSIS — I5033 Acute on chronic diastolic (congestive) heart failure: Secondary | ICD-10-CM | POA: Diagnosis not present

## 2020-06-25 DIAGNOSIS — G4733 Obstructive sleep apnea (adult) (pediatric): Secondary | ICD-10-CM

## 2020-06-25 DIAGNOSIS — I480 Paroxysmal atrial fibrillation: Secondary | ICD-10-CM

## 2020-06-25 MED ORDER — ATORVASTATIN CALCIUM 80 MG PO TABS: 80.0000 mg | ORAL_TABLET | Freq: Every day | ORAL | 3 refills | Status: DC

## 2020-06-25 MED ORDER — AMLODIPINE BESYLATE 5 MG PO TABS: 5.0000 mg | ORAL_TABLET | Freq: Every day | ORAL | 3 refills | Status: DC

## 2020-06-25 NOTE — Patient Instructions (Signed)
Medication Instructions:  START- Amlodipine 5 mg by mouth daily  *If you need a refill on your cardiac medications before your next appointment, please call your pharmacy*   Lab Work: BMP and CBC Today  If you have labs (blood work) drawn today and your tests are completely normal, you will receive your results only by: Marland Kitchen MyChart Message (if you have MyChart) OR . A paper copy in the mail If you have any lab test that is abnormal or we need to change your treatment, we will call you to review the results.   Testing/Procedures: None Ordered   Follow-Up: At [facility], you and your health needs are our priority.  As part of our continuing mission to provide you with exceptional heart care, we have created designated Provider Care Teams.  These Care Teams include your primary Cardiologist (physician) and Advanced Practice Providers (APPs -  Physician Assistants and Nurse Practitioners) who all work together to provide you with the care you need, when you need it.  We recommend signing up for the patient portal called "MyChart".  Sign up information is provided on this After Visit Summary.  MyChart is used to connect with patients for Virtual Visits (Telemedicine).  Patients are able to view lab/test results, encounter notes, upcoming appointments, etc.  Non-urgent messages can be sent to your provider as well.   To learn more about what you can do with MyChart, go to [email or web address].    Your next appointment:   1 month(s)  The format for your next appointment:   In Person  Provider:   You may see Skeet Latch, MD or one of the following Advanced Practice Providers on your designated Care Team:    Estel Scholze, PA-C  [place], [place]  Coletta Memos, [place]    Other Instructions Check weight daily and keep a record

## 2020-06-26 LAB — BASIC METABOLIC PANEL
BUN/Creatinine Ratio: 13 (ref 10–24)
BUN: 17 mg/dL (ref 8–27)
CO2: 26 mmol/L (ref 20–29)
Calcium: 9.7 mg/dL (ref 8.6–10.2)
Chloride: 101 mmol/L (ref 96–106)
Creatinine, Ser: 1.27 mg/dL (ref 0.76–1.27)
GFR calc Af Amer: 67 mL/min/{1.73_m2} (ref >59)
GFR calc non Af Amer: 58 mL/min/{1.73_m2} — ABNORMAL LOW (ref >59)
Glucose: 109 mg/dL — ABNORMAL HIGH (ref 65–99)
Potassium: 4.7 mmol/L (ref 3.5–5.2)
Sodium: 140 mmol/L (ref 134–144)

## 2020-06-26 LAB — CBC
Hematocrit: 40 % (ref 37.5–51.0)
Hemoglobin: 14.3 g/dL (ref 13.0–17.7)
MCH: 35 pg — ABNORMAL HIGH (ref 26.6–33.0)
MCHC: 35.8 g/dL — ABNORMAL HIGH (ref 31.5–35.7)
MCV: 98 fL — ABNORMAL HIGH (ref 79–97)
Platelets: 186 10*3/uL (ref 150–450)
RBC: 4.09 x10E6/uL — ABNORMAL LOW (ref 4.14–5.80)
RDW: 13.9 % (ref 11.6–15.4)
WBC: 5.4 10*3/uL (ref 3.4–10.8)

## 2020-07-02 ENCOUNTER — Telehealth: Payer: Self-pay

## 2020-07-02 DIAGNOSIS — Z9189 Other specified personal risk factors, not elsewhere classified: Secondary | ICD-10-CM

## 2020-07-02 NOTE — Progress Notes (Signed)
See telephone dated 07-02-2020

## 2020-07-02 NOTE — Telephone Encounter (Signed)
Pt informed of providers result & recommendations. Pt verbalized understanding. No further questions . Pt will await call from scheduling.

## 2020-07-02 NOTE — Telephone Encounter (Signed)
Pt notified of instructions below, verbalized understanding. Printed and mailed letter for pt to read and follow   Your cardiac CT will be scheduled at one of the below locations:   [facility] [address] [place], [place] [address] [phone]  If scheduled at [facility], please arrive at the [facility] main entrance of [facility] 30 minutes prior to test start time. Proceed to the [facility] Department (first floor) to check-in and test prep.  If scheduled at [facility], please arrive 15 mins early for check-in and test prep.  Please follow these instructions carefully (unless otherwise directed):  Hold all erectile dysfunction medications at least 3 days (72 hrs) prior to test.  On the Night Before the Test: . Be sure to Drink plenty of water. . Do not consume any caffeinated/decaffeinated beverages or chocolate 12 hours prior to your test. . Do not take any antihistamines 12 hours prior to your test. . If the patient has contrast allergy: ? Patient will need a prescription for Prednisone and very clear instructions (as follows): 1. Prednisone 50 mg - take 13 hours prior to test 2. Take another Prednisone 50 mg 7 hours prior to test 3. Take another Prednisone 50 mg 1 hour prior to test 4. Take Benadryl 50 mg 1 hour prior to test . Patient must complete all four doses of above prophylactic medications. . Patient will need a ride after test due to Benadryl.  On the Day of the Test: . Drink plenty of water. Do not drink any water within one hour of the test. . Do not eat any food 4 hours prior to the test. . You may take your regular medications prior to the test.  . Take metoprolol (Lopressor)175m two hours prior to test. . HOLD Furosemide/Hydrochlorothiazide morning of the test. . FEMALES- please wear underwire-free bra if available        -Drink plenty of water       -Hold  Torsemide/hydrochlorothiazide morning of the test       -Take metoprolol (Lopressor) 1085m2 hours prior to test.   After the Test: . Drink plenty of water. . After receiving IV contrast, you may experience a mild flushed feeling. This is normal. . On occasion, you may experience a mild rash up to 24 hours after the test. This is not dangerous. If this occurs, you can take Benadryl 25 mg and increase your fluid intake. . If you experience trouble breathing, this can be serious. If it is severe call 911 IMMEDIATELY. If it is mild, please call our office. . If you take any of these medications: Glipizide/Metformin, Avandament, Glucavance, please do not take 48 hours after completing test unless otherwise instructed.   Once we have confirmed authorization from your insurance company, we will call you to set up a date and time for your test. Based on how quickly your insurance processes prior authorizations requests, please allow up to 4 weeks to be contacted for scheduling your Cardiac CT appointment. Be advised that routine Cardiac CT appointments could be scheduled as many as 8 weeks after your provider has ordered it.  For non-scheduling related questions, please contact the cardiac imaging nurse navigator should you have any questions/concerns: SaMarchia BondCardiac Imaging Nurse Navigator MeBurley SaverInterim Cardiac Imaging Nurse [facility] Direct Office Dial: [phone] For scheduling needs, including cancellations and rescheduling, please call ToVivien [place]at [phone] 3.

## 2020-07-02 NOTE — Telephone Encounter (Signed)
-----   Message from Deberah Pelton, NP sent at 07/02/2020  1:03 PM EDT ----- Regarding: chest ct-a  ----- Message ----- From: Skeet Latch, MD Sent: 07/01/2020  12:16 PM EDT To: Deberah Pelton, NP  Grier Rocher,  His echo was normal 11/2019.  I doubt its a problem but I think it's Ok if you want to get a chest CT-A.  It doesn't needs to be a cardiac.  Just aorta protocol.  ~TCR ----- Message ----- From: Deberah Pelton, NP Sent: 06/25/2020  12:48 PM EDT To: Skeet Latch, MD  States he has intermittent back pain between the shoulder blades.  He describes the pain as an ache 3-4 on pain scale.  Does not feel it is related to MSK.  Not reproducible during exam.  Do feel we should pursue further testing at this time?  I have low suspicion for anything other than MSK/neuro.  However, he stated that he had a friend that had a aortic aneurysm and has some concern.  Thank you for help.

## 2020-07-03 ENCOUNTER — Telehealth: Payer: Self-pay | Admitting: Cardiovascular Disease

## 2020-07-03 NOTE — Telephone Encounter (Signed)
Spoke with patient regarding appointment for CTA chest aorta scheduled Thursday 07/10/20 at 8:00 am at [facility] --arrival time  7:45 am at the 1st floor radiology department for check in--liquids only 4 hours prior to study.  Patient voiced his understanding.

## 2020-07-09 ENCOUNTER — Telehealth: Payer: Self-pay | Admitting: Cardiovascular Disease

## 2020-07-09 NOTE — Telephone Encounter (Signed)
New message:     Patient calling stating that he need to speak with a nurse concering some instructions.

## 2020-07-09 NOTE — Telephone Encounter (Signed)
Reviewed directions for CTA scheduled for 07/10/20.

## 2020-07-10 ENCOUNTER — Other Ambulatory Visit: Payer: Self-pay

## 2020-07-10 ENCOUNTER — Ambulatory Visit (HOSPITAL_COMMUNITY)
Admission: RE | Admit: 2020-07-10 | Discharge: 2020-07-10 | Disposition: A | Discharge status: Home / Self Care | Payer: Medicare HMO | Source: Ambulatory Visit | Attending: Cardiovascular Disease | Admitting: Cardiovascular Disease

## 2020-07-10 DIAGNOSIS — I359 Nonrheumatic aortic valve disorder, unspecified: Secondary | ICD-10-CM | POA: Diagnosis not present

## 2020-07-10 DIAGNOSIS — I288 Other diseases of pulmonary vessels: Secondary | ICD-10-CM | POA: Diagnosis not present

## 2020-07-10 DIAGNOSIS — I7789 Other specified disorders of arteries and arterioles: Secondary | ICD-10-CM | POA: Diagnosis not present

## 2020-07-10 DIAGNOSIS — Z9189 Other specified personal risk factors, not elsewhere classified: Secondary | ICD-10-CM | POA: Diagnosis not present

## 2020-07-10 DIAGNOSIS — I2510 Atherosclerotic heart disease of native coronary artery without angina pectoris: Secondary | ICD-10-CM | POA: Diagnosis not present

## 2020-07-10 MED ORDER — IOHEXOL 350 MG/ML SOLN
100.0000 mL | Freq: Once | INTRAVENOUS | Status: AC | PRN
  Administered 2020-07-10: 100 mL via INTRAVENOUS

## 2020-07-23 NOTE — Progress Notes (Signed)
Cardiology Clinic Note   Patient Name: Devon Mathews. Date of Encounter: 07/25/2020  Primary Care Provider:  Haywood Pao, MD Primary Cardiologist:  Skeet Latch, MD  Patient Profile    Devon Mathews. 69 year old male presents today for  evaluation of his coronary artery disease, essential hypertension, and shortness of breath. He is also here to review his chest CT.  Past Medical History    Past Medical History:  Diagnosis Date   Atrial fibrillation ([place])    CAD S/P percutaneous coronary angioplasty 09/13/2018    99% p-mLAD (BTW d1&d2) -> SYNERGY DES 3.5 X 12 (3.75 mm). 20% LM.  Cx, small RI & co-dom RCA normal.  EF 65%.    Dizziness 2012   normal findings..   Essential hypertension 07/28/2016   Lower extremity edema 07/28/2016   NSTEMI (non-ST elevated myocardial infarction) ([place]) 09/13/2018   The patient presented 09/13/2018 with a non-ST elevation MI, troponin peak was 0.15   Obesity    Obesity (BMI 30-39.9) 07/28/2016   OSA (obstructive sleep apnea) 07/28/2016   Restless leg syndrome    mild   Shortness of breath 07/28/2016   Past Surgical History:  Procedure Laterality Date   CORONARY BALLOON ANGIOPLASTY N/A 12/05/2018   Procedure: CORONARY BALLOON ANGIOPLASTY;  Surgeon: [name], MD;  Location: [facility];  Service: Cardiovascular;  Laterality: N/A;   CORONARY STENT INTERVENTION N/A 09/13/2018   Procedure: CORONARY STENT INTERVENTION;  Surgeon: Belva Crome, MD;  Location: [facility];  Service: Cardiovascular;; p-m LAD (btw D1&D2) - SYNERGY DES 3.5 x 12 (3.75 mm).   KNEE ARTHROSCOPY Right    LEFT HEART CATH AND CORONARY ANGIOGRAPHY N/A 09/13/2018   Procedure: LEFT HEART CATH AND CORONARY ANGIOGRAPHY;  Surgeon: Belva Crome, MD;  Location: [facility];  Service: Cardiovascular;;  99% p-mLAD (BTW d1&d2) -> SYNERGY DES 3.5 X 12 (3.75 mm). 20% LM.  Cx, small RI & co-dom RCA normal.  EF 65%.    LEFT HEART  CATH AND CORONARY ANGIOGRAPHY  2003   30-40% proximal segmental stenosis in the LAD   LEFT HEART CATH AND CORONARY ANGIOGRAPHY  2006   EF >50% & no mitral regurgitation   LEFT HEART CATH AND CORONARY ANGIOGRAPHY N/A 12/05/2018   Procedure: LEFT HEART CATH AND CORONARY ANGIOGRAPHY;  Surgeon: [name], MD;  Location: [facility];  Service: Cardiovascular;  Laterality: N/A;   NM MYOVIEW LTD  07/2016   Normal.  EF 55-65% 63%).  No ischemia or infarction.  LOW RISK   NM MYOVIEW LTD  11/2018   6: 34 min.  7.2 METS.  Noted chest pain and tightness.  Horizontal ST of T wave depression (2 mm) II, 3, V5 and V6 (HIGH RISK), large/severe defect basal-apical inferior, inferolateral wall.  Large-severe basal-apical anterior-anterolateral wall.  Medium/moderate defect in basal and mid inferoseptal wall.  Consistent with large prior infarct with peri-infarct ischemia.  HIGH RISK   PERICARDIOCENTESIS N/A 12/05/2018   Procedure: PERICARDIOCENTESIS;  Surgeon: [name], MD;  Location: [facility];  Service: Cardiovascular;  Laterality: N/A;   SUBXYPHOID PERICARDIAL WINDOW N/A 12/25/2018   Procedure: SUBXYPHOID PERICARDIAL WINDOW;  Surgeon: [name], MD;  Location: [facility];  Service: Thoracic;  Laterality: N/A;   TEE WITHOUT CARDIOVERSION N/A 12/25/2018   Procedure: TRANSESOPHAGEAL ECHOCARDIOGRAM (TEE);  Surgeon: [name], MD;  Location: [facility];  Service: Thoracic;  Laterality: N/A;   TRANSTHORACIC  ECHOCARDIOGRAM  07/2016   EF 60-65% with mild LVH.  Mild AI.  Trivial MR and TR.    Allergies  Allergies  Allergen Reactions   Penicillins Hives and Nausea And Vomiting    Did it involve swelling of the face/tongue/throat, SOB, or low BP? No Did it involve sudden or severe rash/hives, skin peeling, or any reaction on the inside of your mouth or nose? No Did you need to seek medical attention at a hospital or doctor's office? No When did it last happen?30 + years If  all above answers are NO, may proceed with cephalosporin use.     History of Present Illness    Mr. Geerts has a PMH of coronary artery disease, paroxysmal atrial fibrillation, TIA, obesity, and obstructive sleep apnea. He was [facility] at [facility] 11/19 with non-STEMI and a troponin peak of 0.15. He underwent a diagnostic cardiac catheterization and LAD stenting 09/14/2018. His cardiac catheterization also showed 20% left main narrowing but no other significant stenosis. His LV function was normal. February 2020 he developed chest pain as an outpatient that was concerning for angina. A nuclear stress test 12/01/2018 was abnormal. He was admitted for a diagnostic cardiac catheterization 12/05/2018. His cardiac catheterization at that time showed a 95% jailed DX 1, and a patent previously placed LAD stent. He underwent PCI to his DX which was complicated by vessel perforation and pericardial effusion. He was discharged with plans for medical therapy but ended up in atrial fibrillation with CHF and had a TIA on 12/2018. He underwent a pericardial window 12/25/2018.   He was last seen by Kerin Ransom, PA-C 11/30/2019. During that time he stated he unfortunately lost his job due to the COVID-19 pandemic and his wife was furloughed. When asked about need for financial assistance he stated they were doing okay. 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 He admittedly stated that his diet was not always healthy due to his frequent travel with his new job. He stated he had some lower extremity edema and fullness in his chest. His weight had increased to 284 pounds. In November he weighed 226 pounds. His Lasix was increased to 80 mg x 3days then return back to 40 mg daily. He was also instructed to take his metolazone 30 minutes before his Lasix. His echocardiogram on 12/03/2019 showed LVEF of 60 to 65% and RV enlargement, with mildly decreased RV  systolic function.   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 He statedthat he wouldwalk around his vehicle several times a day for exercise. However, he feltthat since starting this job he hadgotten substantially less exercise. Also due to poor food options he hadbeen eating a higher sodium diet. He feltthat he did not produce much urine with his higher doses of Lasix. I changedhis Lasix to torsemide, order a BMP and for follow-upin 1 week.He was given the salty 6 handout and encouragedto weigh daily.  Hewasseen virtually 3/11/2021and statedhe feltmuch better after starting torsemide. His home scalewasreading 255 pounds however, he didnot think it is quite accurate.Hewasgoing to obtain a new scale. His breathingwasmuch better and his lower extremity edema haddissipated greatly.Hewasusing lower extremity support stockings and following a low-sodium diet. I repeateda BMP.  He presented the clinic for 821 for follow-up and stated he felt like he may have a case of cellulitis. He had an area of  redness on his posterior side of his left calf. He was working on an automobile and struck his left shin with a hammer. The area formed a reddened raised area and traveled around to the posterior side of his left lower leg. He was concerned that it might be cellulitis and presented for evaluation. He has continued to be compliant with his medications, eat a low-sodium diet, and weighing daily. He had received both of his [place] vaccinations for COVID-19.  He contacted nurse triage line on 06/24/2020.  He indicated that his blood pressure was elevated at 190/78.  He had gained weight however, was not weighed daily and did not know what his current weight was.  He indicated he was feeling chest pressure and having difficulty breathing after  walking short distances.  His symptoms resolved after moments of rest.  On recheck his blood pressure came down to 178/73 with a heart rate of 75.  He presented to the clinic 06/25/2020 for follow-up evaluation stated on 06/24/2020 he noticed dizziness when he woke up in the morning and increased shortness of breath with activity.  His weight had increased back to 281 pounds from 274 in April.  His blood pressure was also elevated at 190/78.  In the office he was 148/68.  He admited to dietary indiscretion.  He indicated that he did not add salt to his food but he enjoyed eating takeout food regularly.  He had not been weighing himself daily.  I  added amlodipine 5 mg to his medication regimen and ordered CBC/BMP.  I planned follow-up in 1 month for reevaluation.   He also indicated that he had been noticing intermittent episodes of dull aching back pain between his scapula.  He stated it did not feel like muscle pain.  He indicates he did not lift heavy objects, and was not very physically active.  He did admit to low back arthritic pain.  Also stated that he had a close friend who had a aortic aneurysm which was found incidentally during a  MRI.  I defered to Dr. Oval [name] for recommendations related to his back discomfort and if further testing is warranted here.  A chest CT was ordered and showed no thoracic aortic dissection.  Dilated central pulmonary arteries suggested pulmonary artery hypertension.  Coronary calcifications were also noted.  He presents the clinic today for follow-up evaluation states he feels fairly well.  He has been monitoring his weight and diet.  He states that he knows he needs too much.  He is up about 13 pounds from when he was seen in the clinic in the summer.  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  We also discussed how his weight is affecting his breathing.  We reviewed his chest CT results and  he expressed understanding.  I will continue his current medication regimen.  We will have him follow-up in 6 months and increase his physical activity as tolerated.  Today hedenies chest pain, shortness of breath, fatigue, palpitations,lower extremity edema,melena, hematuria, hemoptysis, diaphoresis, weakness, presyncope, syncope, and PND.  Home Medications    Prior to Admission medications   Medication Sig Start Date End Date Taking? Authorizing Provider  Acetaminophen (TYLENOL PO) Take 2 tablets by mouth as needed (help releive pressure in his chest).    [provider]  amitriptyline (ELAVIL) 25 MG tablet Take 12.5 mg by mouth at bedtime as needed for sleep.  [provider]  amLODipine (NORVASC) 5 MG tablet Take 1 tablet (5 mg total) by mouth daily. 06/25/20 09/23/20  Deberah Pelton, NP  atorvastatin (LIPITOR) 80 MG tablet Take 1 tablet (80 mg total) by mouth daily. 06/25/20   Deberah Pelton, NP  clopidogrel (PLAVIX) 75 MG tablet TAKE 1 TABLET BY MOUTH EVERY DAY 03/24/20   Marigene Erler, Jossie Ng, NP  ELIQUIS 5 MG TABS tablet Take 1 tablet (5 mg total) by mouth 2 (two) times daily. 04/28/20   Lendon Colonel, NP  metoprolol succinate (TOPROL-XL) 50 MG 24 hr tablet TAKE 1 TABLET BY MOUTH EVERY DAY WITH OR IMMEDIATELY FOLLOWING A MEAL 11/20/19   Skeet Latch, MD  nitroGLYCERIN (NITROSTAT) 0.4 MG SL tablet Place 1 tablet (0.4 mg total) under the tongue every 5 (five) minutes as needed for chest pain. 10/30/19   Erlene Quan, PA-C  pantoprazole (PROTONIX) 40 MG tablet TAKE ONE TABLET BY MOUTH ONE TIME DAILY. must make follow up appointment for further refills Patient taking differently: Take 40 mg by mouth at bedtime.  10/07/14   [name], MD  potassium chloride (KLOR-CON) 10 MEQ tablet Take 1 tablet (10 mEq total) by mouth daily as needed. 01/24/20 04/28/20  Deberah Pelton, NP  torsemide (DEMADEX) 20 MG tablet TAKE 1 TAB (20 MG) DAILY. MAY TAKE 2TABS (40MG ) IF  WEIGHT INC >3LB/DAY OR 5LB/WEEK TAKWITH POTASSIUM 06/10/20   Ayssa Bentivegna, Jossie Ng, NP    Family History    Family History  Problem Relation Age of Onset   Heart failure Mother    Other Father        bypass surgery   Heart disease Father    Heart failure Father    COPD Father    Heart attack Sister    Diabetes Sister    Heart failure Sister    Diabetes Brother    Other Maternal Grandfather        smoking related disease   Heart attack Paternal Grandmother    He indicated that his mother is deceased. He indicated that his father is alive. He indicated that his sister is alive. He indicated that his brother is alive. He indicated that his maternal grandmother is deceased. He indicated that his maternal grandfather is deceased. He indicated that his paternal grandmother is deceased. He indicated that his paternal grandfather is deceased.  Social History    Social History   Socioeconomic History   Marital status: Married    Spouse name: Not on file   Number of children: Not on file   Years of education: Not on file   Highest education level: Not on file  Occupational History   Not on file  Tobacco Use   Smoking status: Never Smoker   Smokeless tobacco: Never Used  Vaping Use   Vaping Use: Never used  Substance and Sexual Activity   Alcohol use: No   Drug use: No   Sexual activity: Not on file  Other Topics Concern   Not on file  Social History Narrative   Not on file   Social Determinants of Health   Financial Resource Strain:    Difficulty of Paying Living Expenses: Not on file  Food Insecurity:    Worried About [place] in the Last Year: Not on file   Ran Out of Food in the Last Year: Not on file  Transportation Needs:    Lack of Transportation (Medical): Not on file   Lack of Transportation (  Non-Medical): Not on file  Physical Activity:    Days of Exercise per Week: Not on file   Minutes of Exercise per Session: Not on  file  Stress:    Feeling of Stress : Not on file  Social Connections:    Frequency of Communication with Friends and Family: Not on file   Frequency of Social Gatherings with Friends and Family: Not on file   Attends Religious Services: Not on file   Active Member of Clubs or Organizations: Not on file   Attends Archivist Meetings: Not on file   Marital Status: Not on file  Intimate Partner Violence:    Fear of Current or Ex-Partner: Not on file   Emotionally Abused: Not on file   Physically Abused: Not on file   Sexually Abused: Not on file     Review of Systems    General:  No chills, fever, night sweats or weight changes.  Cardiovascular:  No chest pain, dyspnea on exertion, edema, orthopnea, palpitations, paroxysmal nocturnal dyspnea. Dermatological: No rash, lesions/masses Respiratory: No cough, dyspnea Urologic: No hematuria, dysuria Abdominal:   No nausea, vomiting, diarrhea, bright red blood per rectum, melena, or hematemesis Neurologic:  No visual changes, wkns, changes in mental status. All other systems reviewed and are otherwise negative except as noted above.  Physical Exam    VS:  BP 128/64    Pulse 61    Ht 5\' 9"  (1.753 m)    Wt 283 lb (128.4 kg)    SpO2 97%    BMI 41.79 kg/m  , BMI Body mass index is 41.79 kg/m. GEN: Well nourished, well developed, in no acute distress. HEENT: normal. Neck: Supple, no JVD, carotid bruits, or masses. Cardiac: RRR, no murmurs, rubs, or gallops. No clubbing, cyanosis, edema.  Radials/DP/PT 2+ and equal bilaterally.  Respiratory:  Respirations regular and unlabored, clear to auscultation bilaterally. GI: Soft, nontender, nondistended, BS + x 4. MS: no deformity or atrophy. Skin: warm and dry, no rash. Neuro:  Strength and sensation are intact. Psych: Normal affect.  Accessory Clinical Findings    Recent Labs: 11/30/2019: NT-Pro BNP 158 06/25/2020: BUN 17; Creatinine, Ser 1.27; Hemoglobin 14.3; Platelets  186; Potassium 4.7; Sodium 140   Recent Lipid Panel    Component Value Date/Time   CHOL 79 12/21/2018 0528   TRIG 54 12/21/2018 0528   HDL 26 (L) 12/21/2018 0528   CHOLHDL 3.0 12/21/2018 0528   VLDL 11 12/21/2018 0528   LDLCALC 42 12/21/2018 0528    ECG personally reviewed by me today-none today.  EKG 06/25/2020 normal sinus rhythm 69 bpm no ST or T wave deviation.- No acute changes  EKG 12/05/2019 Normal sinus rhythm 68 bpm   Echocardiogram 12/03/2019 IMPRESSIONS    1. Left ventricular ejection fraction, by estimation, is 60 to 65%. The  left ventricle has normal function. The left ventricle has no regional  wall motion abnormalities. There is moderate left ventricular hypertrophy.  Left ventricular diastolic  parameters were normal.  2. Compared to echo done in June 202 RV remains enlarged somewhat unusual  area of caclification in the RV apex and apical RV pericardium . Right  ventricular systolic function is mildly reduced. The right ventricular  size is moderately enlarged.  3. The mitral valve is normal in structure and function. No evidence of  mitral valve regurgitation. No evidence of mitral stenosis.  4. The aortic valve is tricuspid. Aortic valve regurgitation is trivial.  sclerosis without stenosis.  5. The inferior  vena cava is normal in size with greater than 50%  respiratory variability, suggesting right atrial pressure of 3 mmHg.   Cardiac catheterization 12/05/2018  Previously placed Prox LAD drug eluting stent is widely patent.  Ost 1st Diag lesion is 95% stenosed.  Scoring balloon angioplasty was performed using a BALLOON WOLVERINE 2.50X10.  Post intervention, there is a 20% residual stenosis.  The left ventricular systolic function is normal. The left ventricular ejection fraction is 55-65% by visual estimate.  Successful occlusion of distal diagonal branch with prolonged balloon inflations and protamine infusion after initial wire related  perforation of the distal diagonal branch.  1st Diag lesion is 100% stenosed.  SUMMARY  Culprit lesion:   90% ostial 1st Diag (jailed) -successful scoring balloon angioplasty using 2.5 mm Wolverine scoring balloon  --Distal diagonal wire perforation with pericardial effusion and initial stages of pericardial tamponade --> successful occlusion with prolonged balloon inflations and administration of protamine  --Unsuccessful pericardiocentesis  Widely patent LAD stent  Otherwise stable coronary arteries.  Normal LVEF and EDP pre-PCI  Clear ST elevations for roughly 15 minutes, most likely type for MI.   RECOMMENDATIONS  Admit to CCU on Levophed--continue Levophed overnight  Monitor closely for signs of tamponade. Follow troponins for likely type IV MI  Stat echocardiogram ordered.  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  Diagnostic Dominance: Right  Intervention     Chest CT 07/10/2020  IMPRESSION: 1. Negative for acute PE or thoracic aortic dissection. 2. Dilated central pulmonary arteries suggesting pulmonary arterial hypertension. 3. Coronary calcifications. The severity of coronary artery disease and any potential stenosis cannot be assessed on this non-gated CT examination.  Assessment & Plan   1.  Acute on chronic diastolic CHF-generalized lower extremity nonpitting.  Weight today283 pounds up from 274pounds on 01/24/2020.No increased work of breathing today.  Continue torsemide 20 mg daily-may take an extra dose of torsemide for a weight increase of 3 pounds overnight or 5 pounds in 1 week. Take potassium 10 mEq when taking an extra dose of torsemide Continue metoprolol succinate 50 mg daily Daily weights Heart healthy low-sodium diet-salty 6 given Increase physical activity as tolerated Lower extremity  support stockings-compliant   Dizziness-resolved.  Noticed with waking up 06/24/20 noticed some slight dizziness this morning but has resolved.  Not orthostatic.  Appeared to be secondary to increased blood pressure. Continue to monitor.  Coronary artery disease-no chest pain today. Cardiac catheterization 12/05/2018 with PCI/angioplasty of DX 1. Procedure was complicated by vessel perforation, tamponade, cardiogenic shock and acute kidney injury.He was discharged with plans for medical therapy but ended up in atrial fibrillation with CHF and had a TIA on 12/2018. He underwent a pericardial window 12/25/2018.  Continue atorvastatin, clopidogrel, metoprolol, nitroglycerin  Heart healthy low-sodium diet Increase physical activity as tolerated  Hypertension-BP [id number]. Previously well controlled at home 130s over 70s. Continue metoprolol succinate  Continue amlodipine 5 mg. Heart healthy low-sodium diet Increase physical activity as tolerated  Paroxysmal atrial fibrillation-heart rate today 61.  No recent episodes of tolerated heart rate. Continue Eliquis 5 mg  Avoid triggers caffeine, chocolate, EtOH etc.   Obstructive sleep apnea-continues to be compliant with CPAP Followed by Dr. Claiborne Billings.  Upper back pain-continues to be intermittent.  Chest CT 07/10/2020 negative for dissection/aneurysm.  Had noticed dull aching between his shoulder blades intermittently which lasts at times for several hours and  then dissipates with rest.  He feels this is not muscular in nature.  Appears to be MSK versus neurological in nature.  Not reproducible during physical exam.  Will defer to Dr. Oval [name] for suggestions on further testing. Continue to monitor   Disposition: Follow-up withDr. Oval [name] or me in 6 months.   Jossie Ng. Tasnia Spegal NP-C    07/25/2020, 3:44 PM [address] Office [phone] Fax [phone]  Notice: This dictation  was prepared with Dragon dictation along with smaller phrase technology. Any transcriptional errors that result from this process are unintentional and may not be corrected upon review.

## 2020-07-25 ENCOUNTER — Ambulatory Visit: Payer: Medicare HMO | Admitting: General Practice

## 2020-07-25 ENCOUNTER — Encounter: Payer: Self-pay | Admitting: General Practice

## 2020-07-25 ENCOUNTER — Other Ambulatory Visit: Payer: Self-pay

## 2020-07-25 VITALS — BP 128/64 | HR 61 | Ht 69.0 in | Wt 283.0 lb

## 2020-07-25 DIAGNOSIS — I10 Essential (primary) hypertension: Secondary | ICD-10-CM

## 2020-07-25 DIAGNOSIS — I5033 Acute on chronic diastolic (congestive) heart failure: Secondary | ICD-10-CM | POA: Diagnosis not present

## 2020-07-25 DIAGNOSIS — I2510 Atherosclerotic heart disease of native coronary artery without angina pectoris: Secondary | ICD-10-CM | POA: Diagnosis not present

## 2020-07-25 DIAGNOSIS — R42 Dizziness and giddiness: Secondary | ICD-10-CM | POA: Diagnosis not present

## 2020-07-25 DIAGNOSIS — M549 Dorsalgia, unspecified: Secondary | ICD-10-CM | POA: Diagnosis not present

## 2020-07-25 DIAGNOSIS — G4733 Obstructive sleep apnea (adult) (pediatric): Secondary | ICD-10-CM

## 2020-07-25 DIAGNOSIS — I480 Paroxysmal atrial fibrillation: Secondary | ICD-10-CM

## 2020-07-25 NOTE — Patient Instructions (Signed)
Medication Instructions:  The current medical regimen is effective;  continue present plan and medications as directed. Please refer to the Current Medication list given to you today.  *If you need a refill on your cardiac medications before your next appointment, please call your pharmacy*  Lab Work:   Testing/Procedures:  NONE    NONE  Special Instructions TAKE AND LOG YOUR WEIGHT DAILY  PLEASE INCREASE PHYSICAL ACTIVITY AS TOLERATED  Follow-Up: Your next appointment:  6 month(s) In Person with Skeet Latch, MD -[place], FNP-C Please call our office 2 months in advance to schedule this appointment   At [facility], you and your health needs are our priority.  As part of our continuing mission to provide you with exceptional heart care, we have created designated Provider Care Teams.  These Care Teams include your primary Cardiologist (physician) and Advanced Practice Providers (APPs -  Physician Assistants and Nurse Practitioners) who all work together to provide you with the care you need, when you need it.

## 2020-09-08 ENCOUNTER — Other Ambulatory Visit: Payer: Self-pay | Admitting: General Practice

## 2020-09-29 DIAGNOSIS — R509 Fever, unspecified: Secondary | ICD-10-CM | POA: Diagnosis not present

## 2020-09-29 DIAGNOSIS — I480 Paroxysmal atrial fibrillation: Secondary | ICD-10-CM | POA: Diagnosis not present

## 2020-09-29 DIAGNOSIS — R059 Cough, unspecified: Secondary | ICD-10-CM | POA: Diagnosis not present

## 2020-09-29 DIAGNOSIS — G4733 Obstructive sleep apnea (adult) (pediatric): Secondary | ICD-10-CM | POA: Diagnosis not present

## 2020-09-29 DIAGNOSIS — J309 Allergic rhinitis, unspecified: Secondary | ICD-10-CM | POA: Diagnosis not present

## 2020-09-29 DIAGNOSIS — U071 COVID-19: Secondary | ICD-10-CM | POA: Diagnosis not present

## 2020-09-29 DIAGNOSIS — J101 Influenza due to other identified influenza virus with other respiratory manifestations: Secondary | ICD-10-CM | POA: Diagnosis not present

## 2020-09-29 DIAGNOSIS — Z1152 Encounter for screening for COVID-19: Secondary | ICD-10-CM | POA: Diagnosis not present

## 2020-09-30 ENCOUNTER — Ambulatory Visit (HOSPITAL_COMMUNITY)
Admission: RE | Admit: 2020-09-30 | Discharge: 2020-09-30 | Disposition: A | Discharge status: Home / Self Care | Payer: Medicare Other | Source: Ambulatory Visit | Attending: Pulmonary Disease | Admitting: Pulmonary Disease

## 2020-09-30 ENCOUNTER — Other Ambulatory Visit: Payer: Self-pay | Admitting: Unknown Physician Specialty

## 2020-09-30 ENCOUNTER — Telehealth: Payer: Self-pay | Admitting: Unknown Physician Specialty

## 2020-09-30 DIAGNOSIS — I10 Essential (primary) hypertension: Secondary | ICD-10-CM

## 2020-09-30 DIAGNOSIS — Z23 Encounter for immunization: Secondary | ICD-10-CM | POA: Insufficient documentation

## 2020-09-30 DIAGNOSIS — U071 COVID-19: Secondary | ICD-10-CM

## 2020-09-30 DIAGNOSIS — E669 Obesity, unspecified: Secondary | ICD-10-CM | POA: Diagnosis present

## 2020-09-30 DIAGNOSIS — Z9861 Coronary angioplasty status: Secondary | ICD-10-CM | POA: Diagnosis present

## 2020-09-30 DIAGNOSIS — I2510 Atherosclerotic heart disease of native coronary artery without angina pectoris: Secondary | ICD-10-CM

## 2020-09-30 MED ORDER — ALBUTEROL SULFATE HFA 108 (90 BASE) MCG/ACT IN AERS: 2.0000 | INHALATION_SPRAY | Freq: Once | RESPIRATORY_TRACT | Status: DC | PRN

## 2020-09-30 MED ORDER — EPINEPHRINE 0.3 MG/0.3ML IJ SOAJ: 0.3000 mg | Freq: Once | INTRAMUSCULAR | Status: DC | PRN

## 2020-09-30 MED ORDER — DIPHENHYDRAMINE HCL 50 MG/ML IJ SOLN: 50.0000 mg | Freq: Once | INTRAMUSCULAR | Status: DC | PRN

## 2020-09-30 MED ORDER — SODIUM CHLORIDE 0.9 % IV SOLN: Freq: Once | INTRAVENOUS | Status: AC

## 2020-09-30 MED ORDER — METHYLPREDNISOLONE SODIUM SUCC 125 MG IJ SOLR: 125.0000 mg | Freq: Once | INTRAMUSCULAR | Status: DC | PRN

## 2020-09-30 MED ORDER — SODIUM CHLORIDE 0.9 % IV SOLN: INTRAVENOUS | Status: DC | PRN

## 2020-09-30 MED ORDER — FAMOTIDINE IN NACL 20-0.9 MG/50ML-% IV SOLN: 20.0000 mg | Freq: Once | INTRAVENOUS | Status: DC | PRN

## 2020-09-30 NOTE — Progress Notes (Signed)
Patient reviewed Fact Sheet for Patients, Parents, and Caregivers for Emergency Use Authorization (EUA) of Sotrovimab for the Treatment of Coronavirus. Patient also reviewed and is agreeable to the estimated cost of treatment. Patient is agreeable to proceed.   

## 2020-09-30 NOTE — Telephone Encounter (Signed)
I connected by phone with Devon Mathews. on 09/30/2020 at 11:29 AM to discuss the potential use of a new treatment for mild to moderate COVID-19 viral infection in non-hospitalized patients.  This patient is a 69 y.o. male that meets the FDA criteria for Emergency Use Authorization of COVID monoclonal antibody casirivimab/imdevimab, bamlanivimab/eteseviamb, or sotrovimab.  Has a (+) direct SARS-CoV-2 viral test result  Has mild or moderate COVID-19   Is NOT hospitalized due to COVID-19  Is within 10 days of symptom onset  Has at least one of the high risk factor(s) for progression to severe COVID-19 and/or hospitalization as defined in EUA.  Specific high risk criteria : Older age (>/= 68 yo), BMI > 25 and Cardiovascular disease or hypertension   I have spoken and communicated the following to the patient or parent/caregiver regarding COVID monoclonal antibody treatment:  1. FDA has authorized the emergency use for the treatment of mild to moderate COVID-19 in adults and pediatric patients with positive results of direct SARS-CoV-2 viral testing who are 68 years of age and older weighing at least 40 kg, and who are at high risk for progressing to severe COVID-19 and/or hospitalization.  2. The significant known and potential risks and benefits of COVID monoclonal antibody, and the extent to which such potential risks and benefits are unknown.  3. Information on available alternative treatments and the risks and benefits of those alternatives, including clinical trials.  4. Patients treated with COVID monoclonal antibody should continue to self-isolate and use infection control measures (e.g., wear mask, isolate, social distance, avoid sharing personal items, clean and disinfect "high touch" surfaces, and frequent handwashing) according to CDC guidelines.   5. The patient or parent/caregiver has the option to accept or refuse COVID monoclonal antibody treatment.  After reviewing this  information with the patient, the patient has agreed to receive one of the available covid 19 monoclonal antibodies and will be provided an appropriate fact sheet prior to infusion. Kathrine Haddock, NP 09/30/2020 11:29 AM  Date of onset 12/9

## 2020-09-30 NOTE — Progress Notes (Signed)
  Diagnosis: COVID-19  Physician: Dr. Joya Gaskins  Procedure: [facility] Med: bamlanivimab\etesevimab infusion - Provided patient with bamlanimivab\etesevimab fact sheet for patients, parents and caregivers prior to infusion.  Complications: No immediate complications noted.  Discharge: Discharged home   Devon Mathews 09/30/2020

## 2020-09-30 NOTE — Discharge Instructions (Signed)
10 Things You Can Do to Manage Your COVID-19 Symptoms at Home If you have possible or confirmed COVID-19: 1. Stay home from work and school. And stay away from other public places. If you must go out, avoid using any kind of public transportation, ridesharing, or taxis. 2. Monitor your symptoms carefully. If your symptoms get worse, call your healthcare provider immediately. 3. Get rest and stay hydrated. 4. If you have a medical appointment, call the healthcare provider ahead of time and tell them that you have or may have COVID-19. 5. For medical emergencies, call 911 and notify the dispatch personnel that you have or may have COVID-19. 6. Cover your cough and sneezes with a tissue or use the inside of your elbow. 7. Wash your hands often with soap and water for at least 20 seconds or clean your hands with an alcohol-based hand sanitizer that contains at least 60% alcohol. 8. As much as possible, stay in a specific room and away from other people in your home. Also, you should use a separate bathroom, if available. If you need to be around other people in or outside of the home, wear a mask. 9. Avoid sharing personal items with other people in your household, like dishes, towels, and bedding. 10. Clean all surfaces that are touched often, like counters, tabletops, and doorknobs. Use household cleaning sprays or wipes according to the label instructions. cdc.gov/coronavirus 04/18/2019 This information is not intended to replace advice given to you by your health care provider. Make sure you discuss any questions you have with your health care provider. Document Revised: 09/20/2019 Document Reviewed: 09/20/2019 Elsevier Patient Education  2020 Elsevier Inc. What types of side effects do monoclonal antibody drugs cause?  Common side effects  In general, the more common side effects caused by monoclonal antibody drugs include: . Allergic reactions, such as hives or itching . Flu-like signs and  symptoms, including chills, fatigue, fever, and muscle aches and pains . Nausea, vomiting . Diarrhea . Skin rashes . Low blood pressure   The CDC is recommending patients who receive monoclonal antibody treatments wait at least 90 days before being vaccinated.  Currently, there are no data on the safety and efficacy of mRNA COVID-19 vaccines in persons who received monoclonal antibodies or convalescent plasma as part of COVID-19 treatment. Based on the estimated half-life of such therapies as well as evidence suggesting that reinfection is uncommon in the 90 days after initial infection, vaccination should be deferred for at least 90 days, as a precautionary measure until additional information becomes available, to avoid interference of the antibody treatment with vaccine-induced immune responses. If you have any questions or concerns after the infusion please call the Advanced Practice Provider on call at [phone]. This number is ONLY intended for your use regarding questions or concerns about the infusion post-treatment side-effects.  Please do not provide this number to others for use. For return to work notes please contact your primary care provider.   If someone you know is interested in receiving treatment please have them call the COVID hotline at [phone].   

## 2020-10-04 ENCOUNTER — Other Ambulatory Visit: Payer: Self-pay | Admitting: General Practice

## 2020-10-31 DIAGNOSIS — I2510 Atherosclerotic heart disease of native coronary artery without angina pectoris: Secondary | ICD-10-CM | POA: Diagnosis not present

## 2020-10-31 DIAGNOSIS — E7800 Pure hypercholesterolemia, unspecified: Secondary | ICD-10-CM | POA: Diagnosis not present

## 2020-10-31 DIAGNOSIS — M109 Gout, unspecified: Secondary | ICD-10-CM | POA: Diagnosis not present

## 2020-10-31 DIAGNOSIS — M1990 Unspecified osteoarthritis, unspecified site: Secondary | ICD-10-CM | POA: Diagnosis not present

## 2020-10-31 DIAGNOSIS — I509 Heart failure, unspecified: Secondary | ICD-10-CM | POA: Diagnosis not present

## 2020-10-31 DIAGNOSIS — R7301 Impaired fasting glucose: Secondary | ICD-10-CM | POA: Diagnosis not present

## 2020-10-31 DIAGNOSIS — Z7901 Long term (current) use of anticoagulants: Secondary | ICD-10-CM | POA: Diagnosis not present

## 2020-10-31 DIAGNOSIS — U071 COVID-19: Secondary | ICD-10-CM | POA: Diagnosis not present

## 2020-10-31 DIAGNOSIS — I119 Hypertensive heart disease without heart failure: Secondary | ICD-10-CM | POA: Diagnosis not present

## 2020-10-31 DIAGNOSIS — I480 Paroxysmal atrial fibrillation: Secondary | ICD-10-CM | POA: Diagnosis not present

## 2020-11-13 DIAGNOSIS — Z0100 Encounter for examination of eyes and vision without abnormal findings: Secondary | ICD-10-CM | POA: Diagnosis not present

## 2020-12-04 ENCOUNTER — Other Ambulatory Visit: Payer: Self-pay | Admitting: Cardiovascular Disease

## 2020-12-05 ENCOUNTER — Ambulatory Visit: Payer: Medicare HMO | Admitting: Physician Assistant

## 2020-12-25 ENCOUNTER — Telehealth: Payer: Self-pay | Admitting: Cardiovascular Disease

## 2020-12-25 NOTE — Telephone Encounter (Signed)
Spoke with patient and shortness of breath worse over the last couple of weeks Swelling in ankles and feet Scheduled visit with Dr Oval [name] for 3/15  Patient aware of date and time

## 2020-12-25 NOTE — Telephone Encounter (Signed)
Pt c/o Shortness Of Breath: STAT if SOB developed within the last 24 hours or pt is noticeably SOB on the phone  1. Are you currently SOB (can you hear that pt is SOB on the phone)? No   2. How long have you been experiencing SOB? Patient states he has been SOB since around Christmas when he got COVID, but the SOB has become gradually worse  3. Are you SOB when sitting or when up moving around? When up and moving around   4. Are you currently experiencing any other symptoms? No

## 2020-12-30 ENCOUNTER — Ambulatory Visit: Payer: Medicare HMO | Admitting: Cardiovascular Disease

## 2020-12-30 ENCOUNTER — Encounter: Payer: Self-pay | Admitting: Cardiovascular Disease

## 2020-12-30 ENCOUNTER — Other Ambulatory Visit: Payer: Self-pay

## 2020-12-30 VITALS — BP 127/74 | HR 70 | Ht 70.0 in | Wt 288.4 lb

## 2020-12-30 DIAGNOSIS — G4733 Obstructive sleep apnea (adult) (pediatric): Secondary | ICD-10-CM

## 2020-12-30 DIAGNOSIS — I10 Essential (primary) hypertension: Secondary | ICD-10-CM

## 2020-12-30 DIAGNOSIS — I480 Paroxysmal atrial fibrillation: Secondary | ICD-10-CM

## 2020-12-30 DIAGNOSIS — Z79899 Other long term (current) drug therapy: Secondary | ICD-10-CM | POA: Diagnosis not present

## 2020-12-30 DIAGNOSIS — I2510 Atherosclerotic heart disease of native coronary artery without angina pectoris: Secondary | ICD-10-CM

## 2020-12-30 DIAGNOSIS — I5033 Acute on chronic diastolic (congestive) heart failure: Secondary | ICD-10-CM

## 2020-12-30 DIAGNOSIS — R0602 Shortness of breath: Secondary | ICD-10-CM | POA: Diagnosis not present

## 2020-12-30 DIAGNOSIS — Z9861 Coronary angioplasty status: Secondary | ICD-10-CM

## 2020-12-30 MED ORDER — POTASSIUM CHLORIDE ER 10 MEQ PO TBCR: 10.0000 meq | EXTENDED_RELEASE_TABLET | Freq: Every day | ORAL | 3 refills | Status: DC

## 2020-12-30 MED ORDER — TORSEMIDE 20 MG PO TABS
40.0000 mg | ORAL_TABLET | Freq: Every day | ORAL | 3 refills | Status: DC
Start: 2020-12-30 — End: 2021-02-27

## 2020-12-30 NOTE — Patient Instructions (Signed)
Medication Instructions:  PLEASE TAKE 40mg  TORSEMIDE TWICE DAILY FOR  2 DAYS. ON DAY 3 PLEASE START TAKING 40mg  ONCE DAILY  PLEASE TAKE POTASSIUM 22meq ONCE DAILY  *If you need a refill on your cardiac medications before your next appointment, please call your pharmacy*  Lab Work: BMET AND BNP - IN ONE WEEK If you have labs (blood work) drawn today and your tests are completely normal, you will receive your results only by: Marland Kitchen MyChart Message (if you have MyChart) OR . A paper copy in the mail If you have any lab test that is abnormal or we need to change your treatment, we will call you to review the results.  Follow-Up: At [facility], you and your health needs are our priority.  As part of our continuing mission to provide you with exceptional heart care, we have created designated Provider Care Teams.  These Care Teams include your primary Cardiologist (physician) and Advanced Practice Providers (APPs -  Physician Assistants and Nurse Practitioners) who all work together to provide you with the care you need, when you need it.  Your next appointment:   2 week(s)  The format for your next appointment:   In Person  Provider:   You will see one of the following Advanced Practice Providers on your designated Care Team:    Sande Rives, PA-C  Coletta Memos, FNP  Then, Skeet Latch, MD will plan to see you again in 2 month(s).

## 2020-12-30 NOTE — Progress Notes (Signed)
Cardiology Office Note   Date:  12/30/2020   ID:  Devon Mathews., DOB 01-30-1951, MRN [id number]  PCP:  Haywood Pao, MD  Cardiologist:  Skeet Latch, MD  Electrophysiologist:  None  Sleep: Dr. Claiborne Billings  Evaluation Performed:  Follow-Up Visit  Chief Complaint:  Follow up  History of Present Illness:    Devon Mathews is a 70 y.o. male with CAD s/p NSTEMI and PCI, paroxysmal atrial fibrillation, procedural pericardial effusion/tamponade s/p pericardial window, mild AR, hypertension, hyperlipidemia, OSA and obestiy who presents for follow up.  Devon Mathews was previously seen by Dr. Rollene Fare and by Dr. Einar Gip.  He last saw Dr. Einar Gip 01/2015 and reported mild, chronic dyspnea. He underwent cardiac catheterization in 2003 and 2006 with a 40% LAD lesion noted.  He had an ETT in 2007 that was negative for ischemia and a nuclear stress in 2011 and 2012 that were negative.  He had an echo 07/02/13 that revealed LVEF 55-60% and mild mitral regurgitation. He was seen in clinic 07/2016 and reported several months of chest pain and exertional shortness of breath.  He was referred for exercise Myoview 08/13/16 that revealed LVEF 63% and no ischemia.  He also had an echo 08/16/16 with LVEF 60-65%, mild LVH, mild AR and mild TR.  Devon Mathews had a sleep study in 2014 that demonstrated moderate OSA. He continues to be compliant with CPAP.    Since his last appointment Devon Mathews had an NSTEMI 08/2018.  Troponin peaked at 0.15.  He underwent LHC and had a 95% mid LAD lesion that was successfully stented with a DES.  20% distal left main disease was also noted.  LVEF was 65% on left ventriculogram.  Simvastatin was switched to atorvastatin and metoprolol was increased to 50 mg daily.  Plans were made to continue dual antiplatelet therapy for 1 to 3 months followed by ticagrelor alone.  Since his last appointment Devon Mathews, Devon Mathews and reported angina.  He was referred for [facility] 12/01/2018 where  there was concern for reversible ischemia in the anterolateral region and infarct with peri-infarct ischemia in the inferolateral and inferoseptal regions.  He subsequently underwent left heart catheterization 12/05/2018 that revealed a 90% ostial D1 lesion.  He underwent successful balloon angioplasty.  However the procedure was complicated by a diagonal wire perforation with subsequent development of a pericardial effusion and tamponade.  He developed hemorrhagic pericarditis and was started on colchicine.  He was discharged but developed A worsening effusion and early tamponade was noted on his echo 12/2018.  He was admitted and underwent pericardial window.  During that time he also developed atrial fibrillation and had a TIA.  Devon Mathews has been struggling with shortness of breath.  He is short of breath even walking around in his home.  He also reports orthopnea.  He continues to use his CPAP regularly.  He does not add any salt to his food but he does eat out 2 meals daily.  He tries to limit his fluid intake.  He had an episode of chest tightness that he thought was indigestion.  He took some Rolaids and that seemed to help.  He is not getting much exercise.  He rides in his car 8 to 10 hours daily for work.  He notes that his blood pressure has been elevated as high as the 150s in the evenings.  It is usually better controlled in the mornings.   Past Medical History:  Diagnosis  Date  . Atrial fibrillation ([place])   . CAD S/P percutaneous coronary angioplasty 09/13/2018    99% p-mLAD (BTW d1&d2) -> SYNERGY DES 3.5 X 12 (3.75 mm). 20% LM.  Cx, small RI & co-dom RCA normal.  EF 65%.   . Dizziness 2012   normal findings..  . Essential hypertension 07/28/2016  . Lower extremity edema 07/28/2016  . NSTEMI (non-ST elevated myocardial infarction) ([place]) 09/13/2018   The patient presented 09/13/2018 with a non-ST elevation MI, troponin peak was 0.15  . Obesity   . Obesity (BMI 30-39.9) 07/28/2016  . OSA  (obstructive sleep apnea) 07/28/2016  . Restless leg syndrome    mild  . Shortness of breath 07/28/2016   Past Surgical History:  Procedure Laterality Date  . CORONARY BALLOON ANGIOPLASTY N/A 12/05/2018   Procedure: CORONARY BALLOON ANGIOPLASTY;  Surgeon: [name], MD;  Location: [place] [facility];  Service: Cardiovascular;  Laterality: N/A;  . CORONARY STENT INTERVENTION N/A 09/13/2018   Procedure: CORONARY STENT INTERVENTION;  Surgeon: Belva Crome, MD;  Location: [place] [facility];  Service: Cardiovascular;; p-m LAD (btw D1&D2) - SYNERGY DES 3.5 x 12 (3.75 mm).  Marland Kitchen KNEE ARTHROSCOPY Right   . LEFT HEART CATH AND CORONARY ANGIOGRAPHY N/A 09/13/2018   Procedure: LEFT HEART CATH AND CORONARY ANGIOGRAPHY;  Surgeon: Belva Crome, MD;  Location: [place] [facility];  Service: Cardiovascular;;  99% p-mLAD (BTW d1&d2) -> SYNERGY DES 3.5 X 12 (3.75 mm). 20% LM.  Cx, small RI & co-dom RCA normal.  EF 65%.   Marland Kitchen LEFT HEART CATH AND CORONARY ANGIOGRAPHY  2003   30-40% proximal segmental stenosis in the LAD  . LEFT HEART CATH AND CORONARY ANGIOGRAPHY  2006   EF >50% & no mitral regurgitation  . LEFT HEART CATH AND CORONARY ANGIOGRAPHY N/A 12/05/2018   Procedure: LEFT HEART CATH AND CORONARY ANGIOGRAPHY;  Surgeon: [name], MD;  Location: [place] [facility];  Service: Cardiovascular;  Laterality: N/A;  . NM MYOVIEW LTD  07/2016   Normal.  EF 55-65% 63%).  No ischemia or infarction.  LOW RISK  . NM MYOVIEW LTD  11/2018   6: 34 min.  7.2 METS.  Noted chest pain and tightness.  Horizontal ST of T wave depression (2 mm) II, 3, V5 and V6 (HIGH RISK), large/severe defect basal-apical inferior, inferolateral wall.  Large-severe basal-apical anterior-anterolateral wall.  Medium/moderate defect in basal and mid inferoseptal wall.  Consistent with large prior infarct with peri-infarct ischemia.  HIGH RISK  . PERICARDIOCENTESIS N/A 12/05/2018   Procedure: PERICARDIOCENTESIS;  Surgeon: [name], MD;  Location: [place] [facility];  Service: Cardiovascular;  Laterality: N/A;  . SUBXYPHOID PERICARDIAL WINDOW N/A 12/25/2018   Procedure: SUBXYPHOID PERICARDIAL WINDOW;  Surgeon: [name], MD;  Location: [facility];  Service: Thoracic;  Laterality: N/A;  . TEE WITHOUT CARDIOVERSION N/A 12/25/2018   Procedure: TRANSESOPHAGEAL ECHOCARDIOGRAM (TEE);  Surgeon: [name], MD;  Location: [facility] OR;  Service: Thoracic;  Laterality: N/A;  . TRANSTHORACIC ECHOCARDIOGRAM  07/2016   EF 60-65% with mild LVH.  Mild AI.  Trivial MR and TR.     Current Meds  Medication Sig  . Acetaminophen (TYLENOL PO) Take 2 tablets by mouth as needed (help releive pressure in his chest).  Marland Kitchen amitriptyline (ELAVIL) 25 MG tablet Take 12.5 mg by mouth at bedtime as needed for sleep.   Marland Kitchen atorvastatin (LIPITOR) 80 MG tablet Take 1 tablet (80 mg total) by mouth  daily.  . clopidogrel (PLAVIX) 75 MG tablet TAKE 1 TABLET BY MOUTH EVERY DAY  . ELIQUIS 5 MG TABS tablet Take 1 tablet (5 mg total) by mouth 2 (two) times daily.  . metoprolol succinate (TOPROL-XL) 50 MG 24 hr tablet TAKE 1 TABLET BY MOUTH EVERY DAY WITH OR IMMEDIATELY FOLLOWING A MEAL  . nitroGLYCERIN (NITROSTAT) 0.4 MG SL tablet Place 1 tablet (0.4 mg total) under the tongue every 5 (five) minutes as needed for chest pain.  . pantoprazole (PROTONIX) 40 MG tablet TAKE ONE TABLET BY MOUTH ONE TIME DAILY. must make follow up appointment for further refills (Patient taking differently: Take 40 mg by mouth at bedtime.)  . [DISCONTINUED] torsemide (DEMADEX) 20 MG tablet TAKE 1 TAB DAILY. MAY TAKE 2TABS IF WEIGHT INC >3LB/DAY OR 5LB/WEEK TAKE WITH POTASSIUM     Allergies:   Penicillins   Social History   Tobacco Use  . Smoking status: Never Smoker  . Smokeless tobacco: Never Used  Vaping Use  . Vaping Use: Never used  Substance Use Topics  . Alcohol use: No  . Drug use: No     Family Hx: The patient's family history includes COPD in his father; Diabetes in  his brother and sister; Heart attack in his paternal grandmother and sister; Heart disease in his father; Heart failure in his father, mother, and sister; Other in his father and maternal grandfather.  ROS:   Please see the history of present illness.     All other systems reviewed and are negative.   Prior CV studies:   The following studies were reviewed today:  Lexiscan Myoview 12/01/18:  Nuclear stress EF: 54%.  The left ventricular ejection fraction is mildly decreased (45-54%).  Horizontal ST segment depression ST segment depression of 2 mm was noted during stress in the II, III, V5 and V6 leads, beginning at 8 minutes of stressST deviation beginning in recovery.  Defect 1: There is a large defect of severe severity present in the basal inferior, basal inferolateral, mid inferior, mid inferolateral, apical inferior, apical lateral and apex location.  Defect 2: There is a large defect of severe severity present in the basal anterior, basal anterolateral, mid anterior, mid anterolateral, apical anterior and apex location.  Defect 3: There is a medium defect of moderate severity present in the basal inferoseptal and mid inferoseptal location.  Findings consistent with ischemia and prior myocardial infarction with peri-infarct ischemia.  This is a high risk study.   There is a medium size, severe perfusion defect in the inferior and inferolateral walls with stress that is partially reversible at rest, returning to moderate. Base to apex. There is a absent, large perfusion defect in the anterior wall from base to apex. It is partially reversible at rest and returns to moderate at the base and mid ventricle and returns to mild at the apex. Anterolateral defect is severe, and with full reversibility. Medium size, moderate perfusion defect in the inferoseptum with partial reversibility, returning to mild at rest.   LHC 12/05/18: SUMMARY  Culprit lesion:   90% ostial 1st Diag  (jailed) -successful scoring balloon angioplasty using 2.5 mm Wolverine scoring balloon  --Distal diagonal wire perforation with pericardial effusion and initial stages of pericardial tamponade --> successful occlusion with prolonged balloon inflations and administration of protamine  --Unsuccessful pericardiocentesis  Widely patent LAD stent  Otherwise stable coronary arteries.  Normal LVEF and EDP pre-PCI  Clear ST elevations for roughly 15 minutes, most likely type for MI.  Echo  12/06/18: IMPRESSIONS    1. The left ventricle has normal systolic function, with an ejection fraction of 55-60%. The cavity size was normal. Left ventricular diastolic parameters were normal.  2. Small pericardial effusion seen at the apex, lateral wall and anterior to RV.  3. The mitral valve is degenerative. Mild thickening of the mitral valve leaflet. Mild calcification of the mitral valve leaflet.  4. The aortic valve is tricuspid There is Moderate thickening of the aortic valve There is Mild calcification of the aortic valveAortic valve regurgitation is trivial by color flow Doppler.  Echo 12/21/18:  IMPRESSIONS   1. The left ventricle has hyperdynamic systolic function, with an ejection fraction of >65%. Left ventricular diastology could not be evaluated due to nondiagnostic images.  2. The mitral valve is normal in structure.  3. Aortic valve regurgitation is mild by color flow Doppler.  4. Moderate pericardial effusion.  5. The pericardial effusion is circumferential.  6. There is inversion of the right ventricular wall, inversion of the right atrial wall and excessive respiratory variation in the mitral valve spectral Doppler velocities.  7. When compared to the prior study: Compared to study 11/2018, the pericardial effusion has incrased in size and now significant effusion present posterior as well as anteriorly. There is RA inversion and suggestive of subtle intermittent RV diastolic   collapse. There is some respiratory variation in the [facility] inlow pattern with respirations. The IVC is not well visualized. Concern for early tamponade.   Carotid Doppler 12/21/18: Minimal plaque bilaterally.  Labs/Other Tests and Data Reviewed:    EKG:  An ECG dated 12/30/2020 was personally reviewed today and demonstrated:  Sinus rhythm.  PACs.  Rate 70 bpm.  Recent Labs: 06/25/2020: BUN 17; Creatinine, Ser 1.27; Hemoglobin 14.3; Platelets 186; Potassium 4.7; Sodium 140   Recent Lipid Panel Lab Results  Component Value Date/Time   CHOL 79 12/21/2018 05:28 AM   TRIG 54 12/21/2018 05:28 AM   HDL 26 (L) 12/21/2018 05:28 AM   CHOLHDL 3.0 12/21/2018 05:28 AM   LDLCALC 42 12/21/2018 05:28 AM    Wt Readings from Last 3 Encounters:  12/30/20 288 lb 6.4 oz (130.8 kg)  07/25/20 283 lb (128.4 kg)  06/25/20 281 lb (127.5 kg)     Objective:    VS:  BP 127/74   Pulse 70   Ht 5\' 10"  (1.778 m)   Wt 288 lb 6.4 oz (130.8 kg)   SpO2 93%   BMI 41.38 kg/m  , BMI Body mass index is 41.38 kg/m. GENERAL:  Well appearing HEENT: Pupils equal round and reactive, fundi not visualized, oral mucosa unremarkable NECK:  +jugular venous distention, waveform within normal limits, carotid upstroke brisk and symmetric, no bruits LUNGS:  Clear to auscultation bilaterally HEART:  RRR.  PMI not displaced or sustained,S1 and S2 within normal limits, no S3, no S4, no clicks, no rubs, no murmurs ABD:  Flat, positive bowel sounds normal in frequency in pitch, no bruits, no rebound, no guarding, no midline pulsatile mass, no hepatomegaly, no splenomegaly EXT:  2 plus pulses throughout, 2+ pitting edema to the upper tibia bilaterally, no cyanosis no clubbing SKIN:  No rashes no nodules NEURO:  Cranial nerves II through XII grossly intact, motor grossly intact throughout PSYCH:  Cognitively intact, oriented to person place and time   ASSESSMENT & PLAN:    # CAD: # Hyperlipidemia: Devon Mathews had an NSTEMI  08/2018.  He underwent PCI of the LAD and then had balloon angioplasty of  D1.  Continue clopidogrel and Eliqus. Continue metoprolol and atorvastatin.  LDL was 57 on 03/2020.  # Acute on chronic diastolic heart failure: He is volume overloaded on exam and symptomatic.  We will increase his torsemide to 40 mg twice daily for 2 days.  Then he will increase his standing dose to 40 mg a day.  I instructed him to take his potassium 10 mEq well he is on the higher dose of torsemide.  We will check a basic metabolic panel and a BNP in 1 week.  Also recommended that he start wearing his compression socks again.  # Pericardial effusion: # Tamponade: Doing well clinically s/p subxyphoid window.  There was a microperforation during balloon angioplasty.  No chest pain after stopping colchicine.  He has increased edema lately.  There is no recurrent effusion on his echo 03/2019.  # Mild aortic stenosis:  Repeat echo in 2022. Will order at follow up.  # PAF:  Devon Mathews had atrial fibrillation that was likely 2/2 his effusion and tamponade.  Continue Eliquis and metoprolol.  Maintaining sinus rhythm.  # Hypertension: Blood pressure is well controlled today but has been high some at home.  I suspect this will improve as he gets some fluid off.  Continue current regimen of amlodipine and metoprolol for now.  # OSA: Continue CPAP.   Medication Adjustments/Labs and Tests Ordered: Current medicines are reviewed at length with the patient today.  Concerns regarding medicines are outlined above.   Tests Ordered: Orders Placed This Encounter  Procedures  . Basic metabolic panel  . Brain natriuretic peptide  . EKG 12-Lead    Medication Changes: Meds ordered this encounter  Medications  . torsemide (DEMADEX) 20 MG tablet    Sig: Take 2 tablets (40 mg total) by mouth daily.    Dispense:  60 tablet    Refill:  3  . potassium chloride (KLOR-CON) 10 MEQ tablet    Sig: Take 1 tablet (10 mEq total) by mouth  daily.    Dispense:  30 tablet    Refill:  3    TAKE ONLY WITH EXTRA DOSE OF TORSEMIDE FOR >3#DAY/5#WEEK    Disposition:  Follow up in 2 month(s) with me.  2 weeks APP.  Signed, Skeet Latch, MD  12/30/2020 11:17 AM    [facility]

## 2021-01-02 ENCOUNTER — Other Ambulatory Visit: Payer: Self-pay | Admitting: Cardiovascular Disease

## 2021-01-08 ENCOUNTER — Ambulatory Visit: Payer: Medicare HMO | Admitting: Physician Assistant

## 2021-01-08 ENCOUNTER — Encounter: Payer: Self-pay | Admitting: Physician Assistant

## 2021-01-08 ENCOUNTER — Other Ambulatory Visit: Payer: Self-pay

## 2021-01-08 DIAGNOSIS — C44329 Squamous cell carcinoma of skin of other parts of face: Secondary | ICD-10-CM | POA: Diagnosis not present

## 2021-01-08 DIAGNOSIS — Z1283 Encounter for screening for malignant neoplasm of skin: Secondary | ICD-10-CM | POA: Diagnosis not present

## 2021-01-08 DIAGNOSIS — L570 Actinic keratosis: Secondary | ICD-10-CM | POA: Diagnosis not present

## 2021-01-08 DIAGNOSIS — D044 Carcinoma in situ of skin of scalp and neck: Secondary | ICD-10-CM | POA: Diagnosis not present

## 2021-01-08 DIAGNOSIS — D0439 Carcinoma in situ of skin of other parts of face: Secondary | ICD-10-CM

## 2021-01-08 DIAGNOSIS — D485 Neoplasm of uncertain behavior of skin: Secondary | ICD-10-CM

## 2021-01-08 NOTE — Patient Instructions (Signed)

## 2021-01-15 DIAGNOSIS — Z79899 Other long term (current) drug therapy: Secondary | ICD-10-CM | POA: Diagnosis not present

## 2021-01-15 DIAGNOSIS — R0602 Shortness of breath: Secondary | ICD-10-CM | POA: Diagnosis not present

## 2021-01-16 LAB — BASIC METABOLIC PANEL
BUN/Creatinine Ratio: 13 (ref 10–24)
BUN: 14 mg/dL (ref 8–27)
CO2: 25 mmol/L (ref 20–29)
Calcium: 9.4 mg/dL (ref 8.6–10.2)
Chloride: 103 mmol/L (ref 96–106)
Creatinine, Ser: 1.1 mg/dL (ref 0.76–1.27)
Glucose: 121 mg/dL — ABNORMAL HIGH (ref 65–99)
Potassium: 4.2 mmol/L (ref 3.5–5.2)
Sodium: 144 mmol/L (ref 134–144)
eGFR: 73 mL/min/{1.73_m2} (ref >59)

## 2021-01-16 LAB — BRAIN NATRIURETIC PEPTIDE: BNP: 54.1 pg/mL (ref 0.0–100.0)

## 2021-01-19 ENCOUNTER — Telehealth: Payer: Self-pay | Admitting: Cardiovascular Disease

## 2021-01-19 ENCOUNTER — Encounter: Payer: Self-pay | Admitting: Physician Assistant

## 2021-01-19 MED ORDER — POTASSIUM CHLORIDE ER 10 MEQ PO TBCR: 10.0000 meq | EXTENDED_RELEASE_TABLET | Freq: Every day | ORAL | 3 refills | Status: DC

## 2021-01-19 NOTE — Telephone Encounter (Signed)
*  STAT* If patient is at the pharmacy, call can be transferred to refill team.   1. Which medications need to be refilled? (please list name of each medication and dose if known) potassium chloride (KLOR-CON) 10 MEQ tablet  2. Which pharmacy/location (including street and city if local pharmacy) is medication to be sent to? [id number] [place], [place] - [address]  3. Do they need a 30 day or 90 day supply? 90 day   Patient is out of medication

## 2021-01-19 NOTE — Progress Notes (Signed)
   Follow-Up Visit   Subjective  Devon Mathews. is a 70 y.o. male who presents for the following: Annual Exam (Full body skin check. Patient had lesion on right neck near ear, no longer there, not painful, x months, wart like, white and crusty. Lesions right shin x months, per patient wart like, could be growing. Hernia on abdomen. ).   The following portions of the chart were reviewed this encounter and updated as appropriate:  Tobacco  Allergies  Meds  Problems  Med Hx  Surg Hx  Fam Hx      Objective  Well appearing patient in no apparent distress; mood and affect are within normal limits.  All skin waist up examined.  Objective  Left Abdomen (side) - Lower: Full body skin check.  Left Forehead     Mid Frontal Scalp     Objective  Left Forearm - Posterior (4), Left Mid Helix, Mid Forehead (2), Mid Frontal Scalp, Right Forearm - Posterior (5), Right Mid Helix: Erythematous patches with gritty scale.    Assessment & Plan  Screening exam for skin cancer Left Abdomen (side) - Lower  Yearly skin check.  Neoplasm of uncertain behavior of skin (2) Left Forehead  Skin / nail biopsy Type of biopsy: tangential   Informed consent: discussed and consent obtained   Timeout: patient name, date of birth, surgical site, and procedure verified   Procedure prep:  Patient was prepped and draped in usual sterile fashion (Non sterile) Prep type:  Chlorhexidine Anesthesia: the lesion was anesthetized in a standard fashion   Anesthetic:  1% lidocaine w/ epinephrine 1-100,000 local infiltration Instrument used: flexible razor blade   Outcome: patient tolerated procedure well   Post-procedure details: wound care instructions given    Specimen 1 - Surgical pathology Differential Diagnosis: bcc vs scc  Check Margins: No  Mid Frontal Scalp  Skin / nail biopsy Type of biopsy: tangential   Informed consent: discussed and consent obtained   Timeout: patient name, date of  birth, surgical site, and procedure verified   Procedure prep:  Patient was prepped and draped in usual sterile fashion (Non sterile) Prep type:  Chlorhexidine Anesthesia: the lesion was anesthetized in a standard fashion   Anesthetic:  1% lidocaine w/ epinephrine 1-100,000 local infiltration Instrument used: flexible razor blade   Outcome: patient tolerated procedure well   Post-procedure details: wound care instructions given    Specimen 2 - Surgical pathology Differential Diagnosis: bcc vs scc  Check Margins: No  AK (actinic keratosis) (14) Left Forearm - Posterior (4); Right Forearm - Posterior (5); Left Mid Helix; Right Mid Helix; Mid Frontal Scalp; Mid Forehead (2)  Destruction of lesion - Left Forearm - Posterior, Left Mid Helix, Mid Forehead (2), Mid Frontal Scalp, Right Forearm - Posterior, Right Mid Helix Complexity: simple   Destruction method: cryotherapy   Informed consent: discussed and consent obtained   Timeout:  patient name, date of birth, surgical site, and procedure verified Lesion destroyed using liquid nitrogen: Yes   Cryotherapy cycles:  5 Outcome: patient tolerated procedure well with no complications   Post-procedure details: wound care instructions given      I, Devon Zeiss, PA-C, have reviewed all documentation's for this visit.  The documentation on 01/19/21 for the exam, diagnosis, procedures and orders are all accurate and complete.

## 2021-01-19 NOTE — Progress Notes (Signed)
Error

## 2021-01-20 ENCOUNTER — Encounter (INDEPENDENT_AMBULATORY_CARE_PROVIDER_SITE_OTHER): Payer: Medicare HMO | Admitting: General Practice

## 2021-01-21 ENCOUNTER — Telehealth: Payer: Self-pay | Admitting: *Deleted

## 2021-01-21 ENCOUNTER — Encounter: Payer: Self-pay | Admitting: *Deleted

## 2021-01-21 NOTE — Telephone Encounter (Signed)
-----   Message from Warren Danes, [place] sent at 01/19/2021  4:54 PM EDT ----- 30

## 2021-01-21 NOTE — Telephone Encounter (Signed)
Pathology results to patient. Surgery appointment already scheduled.

## 2021-01-29 ENCOUNTER — Telehealth: Payer: Self-pay | Admitting: *Deleted

## 2021-01-29 DIAGNOSIS — Z79899 Other long term (current) drug therapy: Secondary | ICD-10-CM

## 2021-01-29 NOTE — Telephone Encounter (Signed)
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 If weekend can speak with on-call provider. Call back if worse  He also c/o of having chest pains once similar to when he had his heart attach. Took some antacids he had and pain eventually subsided Scheduled next available in office appointment 5/13 with Blima Ledger NP Patient aware to go to ED if worsening symptoms   Will forward to Dr Oval [name] for review  Patient aware Dr Oval [name] out of office   Devon Mathews, [name].  Deberah Pelton, NP 9 hours ago (10:45 AM)     Good morning [name]. I missed your call last week because I had changed my phone number and failed to give you the new one. I ask someone to let you know I needed to talk with you. I weighed last Friday @ 280 and this morning at 288. It seems to be a lot of fluid and the last two days I've had very bad cramps in my calves. Please help

## 2021-01-29 NOTE — Telephone Encounter (Signed)
See phone note

## 2021-02-05 MED ORDER — LOSARTAN POTASSIUM 50 MG PO TABS: 50.0000 mg | ORAL_TABLET | Freq: Every day | ORAL | 3 refills | Status: DC

## 2021-02-05 NOTE — Telephone Encounter (Signed)
Losartan 50 mg was send into pt pharmacy, pt taking he start taking an extra dose of lasix, pt aware to get BMP done in 1 week, BMP ordered and lab slip mail to pt.

## 2021-02-12 DIAGNOSIS — G4733 Obstructive sleep apnea (adult) (pediatric): Secondary | ICD-10-CM | POA: Diagnosis not present

## 2021-02-13 DIAGNOSIS — Z79899 Other long term (current) drug therapy: Secondary | ICD-10-CM | POA: Diagnosis not present

## 2021-02-14 LAB — BASIC METABOLIC PANEL
BUN/Creatinine Ratio: 19 (ref 10–24)
BUN: 21 mg/dL (ref 8–27)
CO2: 22 mmol/L (ref 20–29)
Calcium: 9.5 mg/dL (ref 8.6–10.2)
Chloride: 100 mmol/L (ref 96–106)
Creatinine, Ser: 1.09 mg/dL (ref 0.76–1.27)
Glucose: 179 mg/dL — ABNORMAL HIGH (ref 65–99)
Potassium: 4.1 mmol/L (ref 3.5–5.2)
Sodium: 140 mmol/L (ref 134–144)
eGFR: 73 mL/min/{1.73_m2} (ref >59)

## 2021-02-25 NOTE — Progress Notes (Signed)
Cardiology Office Note   Date:  02/27/2021   ID:  Devon Glazier., DOB 1951/06/01, MRN [id number]  PCP:  Haywood Pao, MD  Cardiologist: Dr.Seneca  CC: Follow Up CHF    History of Present Illness: Devon Larch. is a 70 y.o. male who presents for ongoing assessment and management of coronary artery disease with history of non-STEMI and PCI, paroxysmal atrial fibrillation, history of pericardial effusion tamponade status post pericardial window, mild AR, hypertension, with other history to include OSA and obesity.  He underwent LHC and had a 95% mid LAD lesion that was successfully stented with a DES. 20% distal left main disease was also noted. LVEF was 65% on left ventriculogram. Simvastatin was switched to atorvastatin and metoprolol was increased to 50 mg daily. Plans were made to continue dual antiplatelet therapy for 1 to 3 months followed by ticagrelor alone.  He was referred for [facility] 12/01/2018 where there was concern for reversible ischemia in the anterolateral region and infarct with peri-infarct ischemia in the inferolateral and inferoseptal regions.  He subsequently underwent left heart catheterization 12/05/2018 that revealed a 90% ostial D1 lesion.  He underwent successful balloon angioplasty.    However the procedure was complicated by a diagonal wire perforation with subsequent development of a pericardial effusion and tamponade.  He developed hemorrhagic pericarditis and was started on colchicine.  He was discharged but developed A worsening effusion and early tamponade was noted on his echo 12/2018.  He was admitted and underwent pericardial window.  During that time he also developed atrial fibrillation and had a TIA.  Was last seen by Dr. Oval [name] on 12/30/2020.  At that time he was found be volume overloaded on exam and symptomatic and therefore torsemide was increased to 40 mg twice a day for 2 days and standing dose of torsemide was increased to 40 mg daily.   He was increased temporarily on potassium dose during higher doses of torsemide.  He is to have a repeat echocardiogram.  He was compliant with CPAP.   He comes today without complaints with the exception of some dyspnea on exertion and mild PND.  His weight has continued to rise and is up from 288 pounds in March to 295 pounds today.  He is complained of some abdominal distention.  He denies chest pain, he does have some occasional dyspnea on exertion, no orthopnea.  He has been taking torsemide 40 mg daily as directed and has not felt as if it has been working as well over the last month or so.  Past Medical History:  Diagnosis Date  . Atrial fibrillation ([place])   . CAD S/P percutaneous coronary angioplasty 09/13/2018    99% p-mLAD (BTW d1&d2) -> SYNERGY DES 3.5 X 12 (3.75 mm). 20% LM.  Cx, small RI & co-dom RCA normal.  EF 65%.   . Dizziness 2012   normal findings..  . Essential hypertension 07/28/2016  . Lower extremity edema 07/28/2016  . NSTEMI (non-ST elevated myocardial infarction) ([place]) 09/13/2018   The patient presented 09/13/2018 with a non-ST elevation MI, troponin peak was 0.15  . Obesity   . Obesity (BMI 30-39.9) 07/28/2016  . OSA (obstructive sleep apnea) 07/28/2016  . Restless leg syndrome    mild  . Shortness of breath 07/28/2016  . Squamous cell carcinoma of skin 03/20/2015   right ant scalp(curetx3, 60fu)  . Squamous cell carcinoma of skin 01/01/2016   left lateral forehead (curetx3, 85fu)  . Squamous cell carcinoma of  skin 06/15/2019   left mid anterior scalp (tx afte bx)  . Squamous cell carcinoma of skin 01/08/2021   well diff- left forehead  . Squamous cell carcinoma of skin 01/08/2021   in situ- mid frontal scalp    Past Surgical History:  Procedure Laterality Date  . CORONARY BALLOON ANGIOPLASTY N/A 12/05/2018   Procedure: CORONARY BALLOON ANGIOPLASTY;  Surgeon: [name], MD;  Location: [place] [facility];  Service: Cardiovascular;  Laterality: N/A;   . CORONARY STENT INTERVENTION N/A 09/13/2018   Procedure: CORONARY STENT INTERVENTION;  Surgeon: Belva Crome, MD;  Location: [place] [facility];  Service: Cardiovascular;; p-m LAD (btw D1&D2) - SYNERGY DES 3.5 x 12 (3.75 mm).  Marland Kitchen KNEE ARTHROSCOPY Right   . LEFT HEART CATH AND CORONARY ANGIOGRAPHY N/A 09/13/2018   Procedure: LEFT HEART CATH AND CORONARY ANGIOGRAPHY;  Surgeon: Belva Crome, MD;  Location: [facility] [facility];  Service: Cardiovascular;;  99% p-mLAD (BTW d1&d2) -> SYNERGY DES 3.5 X 12 (3.75 mm). 20% LM.  Cx, small RI & co-dom RCA normal.  EF 65%.   Marland Kitchen LEFT HEART CATH AND CORONARY ANGIOGRAPHY  2003   30-40% proximal segmental stenosis in the LAD  . LEFT HEART CATH AND CORONARY ANGIOGRAPHY  2006   EF >50% & no mitral regurgitation  . LEFT HEART CATH AND CORONARY ANGIOGRAPHY N/A 12/05/2018   Procedure: LEFT HEART CATH AND CORONARY ANGIOGRAPHY;  Surgeon: [name], MD;  Location: [place] [facility];  Service: Cardiovascular;  Laterality: N/A;  . NM MYOVIEW LTD  07/2016   Normal.  EF 55-65% 63%).  No ischemia or infarction.  LOW RISK  . NM MYOVIEW LTD  11/2018   6: 34 min.  7.2 METS.  Noted chest pain and tightness.  Horizontal ST of T wave depression (2 mm) II, 3, V5 and V6 (HIGH RISK), large/severe defect basal-apical inferior, inferolateral wall.  Large-severe basal-apical anterior-anterolateral wall.  Medium/moderate defect in basal and mid inferoseptal wall.  Consistent with large prior infarct with peri-infarct ischemia.  HIGH RISK  . PERICARDIOCENTESIS N/A 12/05/2018   Procedure: PERICARDIOCENTESIS;  Surgeon: [name], MD;  Location: [place];  Service: Cardiovascular;  Laterality: N/A;  . SUBXYPHOID PERICARDIAL WINDOW N/A 12/25/2018   Procedure: SUBXYPHOID PERICARDIAL WINDOW;  Surgeon: [name], MD;  Location: [facility];  Service: Thoracic;  Laterality: N/A;  . TEE WITHOUT CARDIOVERSION N/A 12/25/2018   Procedure: TRANSESOPHAGEAL ECHOCARDIOGRAM (TEE);   Surgeon: [name], MD;  Location: [facility];  Service: Thoracic;  Laterality: N/A;  . TRANSTHORACIC ECHOCARDIOGRAM  07/2016   EF 60-65% with mild LVH.  Mild AI.  Trivial MR and TR.     Current Outpatient Medications  Medication Sig Dispense Refill  . Acetaminophen (TYLENOL PO) Take 2 tablets by mouth as needed (help releive pressure in his chest).    Marland Kitchen amitriptyline (ELAVIL) 25 MG tablet Take 12.5 mg by mouth at bedtime as needed for sleep.     Marland Kitchen amLODipine (NORVASC) 5 MG tablet Take 1 tablet (5 mg total) by mouth daily. 90 tablet 3  . atorvastatin (LIPITOR) 80 MG tablet Take 1 tablet (80 mg total) by mouth daily. 90 tablet 3  . clopidogrel (PLAVIX) 75 MG tablet TAKE 1 TABLET BY MOUTH EVERY DAY 90 tablet 3  . ELIQUIS 5 MG TABS tablet Take 1 tablet (5 mg total) by mouth 2 (two) times daily. 180 tablet 3  . losartan (COZAAR) 50 MG tablet Take 1  tablet (50 mg total) by mouth daily. 90 tablet 3  . metoprolol succinate (TOPROL-XL) 50 MG 24 hr tablet TAKE 1 TABLET BY MOUTH EVERY DAY WITH OR IMMEDIATELY FOLLOWING A MEAL 90 tablet 3  . nitroGLYCERIN (NITROSTAT) 0.4 MG SL tablet Place 1 tablet (0.4 mg total) under the tongue every 5 (five) minutes as needed for chest pain. 75 tablet 3  . pantoprazole (PROTONIX) 40 MG tablet TAKE ONE TABLET BY MOUTH ONE TIME DAILY. must make follow up appointment for further refills (Patient taking differently: Take 40 mg by mouth at bedtime.) 15 tablet 0  . potassium chloride 20 MEQ TBCR Take 20 mEq by mouth daily. 90 tablet 3  . torsemide (DEMADEX) 20 MG tablet Take 3 tablets (60 mg total) by mouth daily. 270 tablet 3   No current facility-administered medications for this visit.    Allergies:   Penicillins    Social History:  The patient  reports that he has never smoked. He has never used smokeless tobacco. He reports that he does not drink alcohol and does not use drugs.   Family History:  The patient's family history includes COPD in his father; Diabetes  in his brother and sister; Heart attack in his paternal grandmother and sister; Heart disease in his father; Heart failure in his father, mother, and sister; Other in his father and maternal grandfather.    ROS: All other systems are reviewed and negative. Unless otherwise mentioned in H&P    PHYSICAL EXAM: VS:  BP 130/60   Pulse 68   Ht 5\' 10"  (1.778 m)   Wt 295 lb (133.8 kg)   BMI 42.33 kg/m  , BMI Body mass index is 42.33 kg/m. GEN: Well nourished, well developed, in no acute distress HEENT: normal Neck: no JVD, carotid bruits, or masses Cardiac: RRR; no murmurs, rubs, or gallops,no edema  Respiratory:  Clear to auscultation bilaterally, normal work of breathing GI: soft, nontender, nondistended, + BS MS: no deformity or atrophy Skin: warm and dry, no rash Neuro:  Strength and sensation are intact Psych: euthymic mood, full affect   EKG: Not completed this office visit Recent Labs: 06/25/2020: Hemoglobin 14.3; Platelets 186 01/15/2021: BNP 54.1 02/13/2021: BUN 21; Creatinine, Ser 1.09; Potassium 4.1; Sodium 140    Lipid Panel    Component Value Date/Time   CHOL 79 12/21/2018 0528   TRIG 54 12/21/2018 0528   HDL 26 (L) 12/21/2018 0528   CHOLHDL 3.0 12/21/2018 0528   VLDL 11 12/21/2018 0528   LDLCALC 42 12/21/2018 0528      Wt Readings from Last 3 Encounters:  02/27/21 295 lb (133.8 kg)  12/30/20 288 lb 6.4 oz (130.8 kg)  07/25/20 283 lb (128.4 kg)      Other studies Reviewed: Lexiscan Myoview 12/01/18:  Nuclear stress EF: 54%.  The left ventricular ejection fraction is mildly decreased (45-54%).  Horizontal ST segment depression ST segment depression of 2 mm was noted during stress in the II, III, V5 and V6 leads, beginning at 8 minutes of stressST deviation beginning in recovery.  Defect 1: There is a large defect of severe severity present in the basal inferior, basal inferolateral, mid inferior, mid inferolateral, apical inferior, apical lateral and apex  location.  Defect 2: There is a large defect of severe severity present in the basal anterior, basal anterolateral, mid anterior, mid anterolateral, apical anterior and apex location.  Defect 3: There is a medium defect of moderate severity present in the basal inferoseptal and mid inferoseptal  location.  Findings consistent with ischemia and prior myocardial infarction with peri-infarct ischemia.  This is a high risk study.  There is a medium size, severe perfusion defect in the inferior and inferolateral walls with stress that is partially reversible at rest, returning to moderate. Base to apex. There is a absent, large perfusion defect in the anterior wall from base to apex. It is partially reversible at rest and returns to moderate at the base and mid ventricle and returns to mild at the apex. Anterolateral defect is severe, and with full reversibility. Medium size, moderate perfusion defect in the inferoseptum with partial reversibility, returning to mild at rest.   LHC 12/05/18: SUMMARY  Culprit lesion:   90% ostial 1st Diag (jailed) -successful scoring balloon angioplasty using 2.5 mm Wolverine scoring balloon  --Distal diagonal wire perforation with pericardial effusion and initial stages of pericardial tamponade --> successful occlusion with prolonged balloon inflations and administration of protamine  --Unsuccessful pericardiocentesis  Widely patent LAD stent  Otherwise stable coronary arteries.  Normal LVEF and EDP pre-PCI  Clear ST elevations for roughly 15 minutes, most likely type for MI.  Echo 12/06/18: IMPRESSIONS   1. The left ventricle has normal systolic function, with an ejection fraction of 55-60%. The cavity size was normal. Left ventricular diastolic parameters were normal. 2. Small pericardial effusion seen at the apex, lateral wall and anterior to RV. 3. The mitral valve is degenerative. Mild thickening of the mitral valve leaflet. Mild  calcification of the mitral valve leaflet. 4. The aortic valve is tricuspid There is Moderate thickening of the aortic valve There is Mild calcification of the aortic valveAortic valve regurgitation is trivial by color flow Doppler.  Echo 12/21/18:  IMPRESSIONS  1. The left ventricle has hyperdynamic systolic function, with an ejection fraction of >65%. Left ventricular diastology could not be evaluated due to nondiagnostic images. 2. The mitral valve is normal in structure. 3. Aortic valve regurgitation is mild by color flow Doppler. 4. Moderate pericardial effusion. 5. The pericardial effusion is circumferential. 6. There is inversion of the right ventricular wall, inversion of the right atrial wall and excessive respiratory variation in the mitral valve spectral Doppler velocities. 7. When compared to the prior study: Compared to study 11/2018, the pericardial effusion has incrased in size and now significant effusion present posterior as well as anteriorly. There is RA inversion and suggestive of subtle intermittent RV diastolic  collapse. There is some respiratory variation in the [facility] inlow pattern with respirations. The IVC is not well visualized. Concern for early tamponade.   Carotid Doppler 12/21/18: Minimal plaque bilaterally.   ASSESSMENT AND PLAN:  1.  Acute on chronic diastolic CHF: The patient has gained some weight and has some lower extremity edema despite use of torsemide at 40 mg daily.  I will increase it to 60 mg daily, 40 mg in the morning 20 mg in the evening.  I will also repeat his echocardiogram for LV function in comparison to prior.  He will increase his potassium to 20 mEq daily.  I will repeat a BMET in 2 weeks.  2.  Coronary artery disease: History of drug-eluting stent to the mid LAD.  Continue on secondary prevention.  No complaints of chest pain at this time.  Remains on Plavix.  Not on aspirin as he is also on DOAC.  3.  Atrial fibrillation: Heart  rate is well controlled.  Remains on Eliquis 5 mg twice daily   4.  History of cardiac tamponade status  post pericardial window: No recurrent discomfort, is having some dyspnea on exertion which I believe is related to fluid overload Repeating echocardiogram.  5  Hypertension: Currently well controlled despite some fluid overload.  No changes to regimen at this time.  Current medicines are reviewed at length with the patient today.  I have spent 25 minutes  dedicated to the care of this patient on the date of this encounter to include pre-visit review of records, assessment, management and diagnostic testing,with shared decision making.  Labs/ tests ordered today include: BMET   Phill Myron. West Pugh, ANP, AACC   02/27/2021 12:32 PM    [facility] [place] [address] Office [phone] Fax [phone]  Notice: This dictation was prepared with Dragon dictation along with smaller phrase technology. Any transcriptional errors that result from this process are unintentional and may not be corrected upon review.

## 2021-02-27 ENCOUNTER — Telehealth: Payer: Self-pay | Admitting: Adult Health

## 2021-02-27 ENCOUNTER — Ambulatory Visit: Payer: Medicare HMO | Admitting: Adult Health

## 2021-02-27 ENCOUNTER — Encounter: Payer: Self-pay | Admitting: Adult Health

## 2021-02-27 ENCOUNTER — Other Ambulatory Visit: Payer: Self-pay

## 2021-02-27 VITALS — BP 130/60 | HR 68 | Ht 70.0 in | Wt 295.0 lb

## 2021-02-27 DIAGNOSIS — I5033 Acute on chronic diastolic (congestive) heart failure: Secondary | ICD-10-CM

## 2021-02-27 DIAGNOSIS — Z131 Encounter for screening for diabetes mellitus: Secondary | ICD-10-CM | POA: Diagnosis not present

## 2021-02-27 DIAGNOSIS — I10 Essential (primary) hypertension: Secondary | ICD-10-CM | POA: Diagnosis not present

## 2021-02-27 DIAGNOSIS — I2510 Atherosclerotic heart disease of native coronary artery without angina pectoris: Secondary | ICD-10-CM | POA: Diagnosis not present

## 2021-02-27 DIAGNOSIS — Z7901 Long term (current) use of anticoagulants: Secondary | ICD-10-CM | POA: Diagnosis not present

## 2021-02-27 DIAGNOSIS — I480 Paroxysmal atrial fibrillation: Secondary | ICD-10-CM

## 2021-02-27 DIAGNOSIS — Z9889 Other specified postprocedural states: Secondary | ICD-10-CM

## 2021-02-27 DIAGNOSIS — Z79899 Other long term (current) drug therapy: Secondary | ICD-10-CM | POA: Diagnosis not present

## 2021-02-27 MED ORDER — TORSEMIDE 20 MG PO TABS
60.0000 mg | ORAL_TABLET | Freq: Every day | ORAL | 3 refills | Status: DC
Start: 2021-02-27 — End: 2021-03-30

## 2021-02-27 MED ORDER — POTASSIUM CHLORIDE ER 20 MEQ PO TBCR
20.0000 meq | EXTENDED_RELEASE_TABLET | Freq: Every day | ORAL | 3 refills | Status: DC
Start: 2021-02-27 — End: 2021-06-10

## 2021-02-27 NOTE — Patient Instructions (Addendum)
Medication Instructions:  INCREASE- Torsemide 60 mg(3 tablets) by mouth daily INCREASE- Potassium 20 meq by mouth daily  *If you need a refill on your cardiac medications before your next appointment, please call your pharmacy*   Lab Work: BMP and HgB A1C in 2 weeks  If you have labs (blood work) drawn today and your tests are completely normal, you will receive your results only by: Marland Kitchen MyChart Message (if you have MyChart) OR . A paper copy in the mail If you have any lab test that is abnormal or we need to change your treatment, we will call you to review the results.   Testing/Procedures: Your physician has requested that you have an echocardiogram. Echocardiography is a painless test that uses sound waves to create images of your heart. It provides your doctor with information about the size and shape of your heart and how well your heart's chambers and valves are working. This procedure takes approximately one hour. There are no restrictions for this procedure.   Follow-Up: At [facility], you and your health needs are our priority.  As part of our continuing mission to provide you with exceptional heart care, we have created designated Provider Care Teams.  These Care Teams include your primary Cardiologist (physician) and Advanced Practice Providers (APPs -  Physician Assistants and Nurse Practitioners) who all work together to provide you with the care you need, when you need it.  We recommend signing up for the patient portal called "MyChart".  Sign up information is provided on this After Visit Summary.  MyChart is used to connect with patients for Virtual Visits (Telemedicine).  Patients are able to view lab/test results, encounter notes, upcoming appointments, etc.  Non-urgent messages can be sent to your provider as well.   To learn more about what you can do with MyChart, go to [email or web address].    Your next appointment:   1 month(s)  The format for your next  appointment:   In Person  Provider:   Dr Oval [name] or Jory Sims, DNP

## 2021-02-27 NOTE — Telephone Encounter (Signed)
Spoke with patient regarding the Friday 03/27/21 9:00 am Echocardiogram appointment at [address], [address]---arrival time is 8:45 am---will mail information to patient and he voiced his understanding

## 2021-03-09 DIAGNOSIS — Z79899 Other long term (current) drug therapy: Secondary | ICD-10-CM | POA: Diagnosis not present

## 2021-03-09 DIAGNOSIS — Z131 Encounter for screening for diabetes mellitus: Secondary | ICD-10-CM | POA: Diagnosis not present

## 2021-03-09 DIAGNOSIS — I10 Essential (primary) hypertension: Secondary | ICD-10-CM | POA: Diagnosis not present

## 2021-03-09 LAB — BASIC METABOLIC PANEL
BUN/Creatinine Ratio: 15 (ref 10–24)
BUN: 19 mg/dL (ref 8–27)
CO2: 25 mmol/L (ref 20–29)
Calcium: 9.4 mg/dL (ref 8.6–10.2)
Chloride: 100 mmol/L (ref 96–106)
Creatinine, Ser: 1.3 mg/dL — ABNORMAL HIGH (ref 0.76–1.27)
Glucose: 146 mg/dL — ABNORMAL HIGH (ref 65–99)
Potassium: 4.4 mmol/L (ref 3.5–5.2)
Sodium: 139 mmol/L (ref 134–144)
eGFR: 59 mL/min/{1.73_m2} — ABNORMAL LOW (ref >59)

## 2021-03-09 LAB — HEMOGLOBIN A1C
Est. average glucose Bld gHb Est-mCnc: 151 mg/dL
Hgb A1c MFr Bld: 6.9 % — ABNORMAL HIGH (ref 4.8–5.6)

## 2021-03-14 DIAGNOSIS — G4733 Obstructive sleep apnea (adult) (pediatric): Secondary | ICD-10-CM | POA: Diagnosis not present

## 2021-03-16 ENCOUNTER — Other Ambulatory Visit: Payer: Self-pay | Admitting: General Practice

## 2021-03-17 ENCOUNTER — Telehealth: Payer: Self-pay | Admitting: Adult Health

## 2021-03-17 NOTE — Telephone Encounter (Signed)
Pt c/o medication issue:  1. Name of Medication:   losartan (COZAAR) 50 MG tablet    2. How are you currently taking this medication (dosage and times per day)? AS prescribed  3. Are you having a reaction (difficulty breathing--STAT)? unknown  4. What is your medication issue? Pt is calling with concerns he feels that this medication is making him have really bad anxiety attacks.

## 2021-03-17 NOTE — Telephone Encounter (Signed)
Spoke to patient Devon Mathews's advice given.Stated his B/P has been ranging 145 to 146 / 68 to 70. Pulse 70.Advised to see PCP if panic attacks continue.Advised to keep echo appointment and appointment with Dr.Balaton as planned.

## 2021-03-17 NOTE — Telephone Encounter (Signed)
Losartan NOT known to cause anxiety, nightmares, or panic attacks.  Patient sleeping well? Any assessment of follow up with PCP for anxiety?  What is the blood pressure while on losartan? Looks like losartan was added to his therapy by Dr Oval [name] after reported BP 150's/70s.  Please provide BP so, I can determine best therapy alternative (if needed).

## 2021-03-17 NOTE — Telephone Encounter (Signed)
Spoke to patient he stated since he started on Losartan he has had 2 panic attacks.Stated he has been working out of town since January of this year.Stated panic attacks have happened when he was on way out of town.Stated he becomes very anxious and nauseated.Advised I will send message to our pharmacist for advice.

## 2021-03-20 DIAGNOSIS — R69 Illness, unspecified: Secondary | ICD-10-CM | POA: Diagnosis not present

## 2021-03-24 ENCOUNTER — Other Ambulatory Visit: Payer: Self-pay | Admitting: *Deleted

## 2021-03-24 MED ORDER — NITROGLYCERIN 0.4 MG SL SUBL: 0.4000 mg | SUBLINGUAL_TABLET | SUBLINGUAL | 3 refills | Status: DC | PRN

## 2021-03-27 ENCOUNTER — Ambulatory Visit (HOSPITAL_COMMUNITY): Payer: Medicare HMO | Attending: Cardiology

## 2021-03-27 ENCOUNTER — Other Ambulatory Visit: Payer: Self-pay

## 2021-03-27 DIAGNOSIS — I5033 Acute on chronic diastolic (congestive) heart failure: Secondary | ICD-10-CM | POA: Diagnosis not present

## 2021-03-27 LAB — ECHOCARDIOGRAM COMPLETE
Area-P 1/2: 3.12 cm2
S' Lateral: 3.6 cm

## 2021-03-27 NOTE — Progress Notes (Signed)
Patient ID: Devon Glazier., male   DOB: June 15, 1951, 70 y.o.   MRN: [id number]   Echocardiogram 2D Echocardiogram has been performed.  Jennette Dubin 03/27/21

## 2021-03-30 ENCOUNTER — Other Ambulatory Visit: Payer: Self-pay | Admitting: Cardiovascular Disease

## 2021-04-01 ENCOUNTER — Other Ambulatory Visit: Payer: Self-pay | Admitting: Adult Health

## 2021-04-03 ENCOUNTER — Encounter: Payer: Self-pay | Admitting: *Deleted

## 2021-04-03 ENCOUNTER — Ambulatory Visit: Payer: Medicare HMO | Admitting: Adult Health

## 2021-04-14 DIAGNOSIS — G4733 Obstructive sleep apnea (adult) (pediatric): Secondary | ICD-10-CM | POA: Diagnosis not present

## 2021-04-15 ENCOUNTER — Other Ambulatory Visit: Payer: Self-pay

## 2021-04-15 MED ORDER — ATORVASTATIN CALCIUM 80 MG PO TABS: 80.0000 mg | ORAL_TABLET | Freq: Every day | ORAL | 0 refills | Status: DC

## 2021-04-17 DIAGNOSIS — M109 Gout, unspecified: Secondary | ICD-10-CM | POA: Diagnosis not present

## 2021-04-17 DIAGNOSIS — E7800 Pure hypercholesterolemia, unspecified: Secondary | ICD-10-CM | POA: Diagnosis not present

## 2021-04-17 DIAGNOSIS — Z125 Encounter for screening for malignant neoplasm of prostate: Secondary | ICD-10-CM | POA: Diagnosis not present

## 2021-04-17 DIAGNOSIS — R7301 Impaired fasting glucose: Secondary | ICD-10-CM | POA: Diagnosis not present

## 2021-04-17 LAB — PSA: PSA: 0.587

## 2021-04-17 LAB — BASIC METABOLIC PANEL
BUN: 19 (ref 4–21)
CO2: 26 — AB (ref 13–22)
Creatinine: 1.3 (ref 0.6–1.3)
Glucose: 113
Potassium: 4.5 (ref 3.4–5.3)
Sodium: 140 (ref 137–147)

## 2021-04-17 LAB — LIPID PANEL
Cholesterol: 139 (ref 0–200)
HDL: 33 — AB (ref 35–70)
LDL Cholesterol: 85
LDl/HDL Ratio: 2.6
Triglycerides: 106 (ref 40–160)

## 2021-04-17 LAB — CBC AND DIFFERENTIAL
HCT: 37 — AB (ref 41–53)
Hemoglobin: 12.8 — AB (ref 13.5–17.5)
Platelets: 173 (ref 150–399)
WBC: 4.8

## 2021-04-17 LAB — HEMOGLOBIN A1C: Hemoglobin A1C: 6.2

## 2021-04-17 LAB — CBC: RBC: 3.6 — AB (ref 3.87–5.11)

## 2021-04-17 LAB — HEPATIC FUNCTION PANEL
ALT: 38 (ref 10–40)
AST: 28 (ref 14–40)
Alkaline Phosphatase: 73 (ref 25–125)
Bilirubin, Total: 0.8

## 2021-04-17 LAB — COMPREHENSIVE METABOLIC PANEL
GFR calc Af Amer: 66.2
GFR calc non Af Amer: 54.7

## 2021-04-17 LAB — MICROALBUMIN, URINE: Microalb, Ur: 5

## 2021-04-21 ENCOUNTER — Telehealth: Payer: Self-pay | Admitting: Adult Health

## 2021-04-21 ENCOUNTER — Ambulatory Visit: Payer: Medicare HMO | Admitting: Physician Assistant

## 2021-04-21 NOTE — Telephone Encounter (Signed)
69m, 133.8kg, scr 1.3 03/09/21, lovw/lawrence 02/27/21

## 2021-04-24 DIAGNOSIS — Z0000 Encounter for general adult medical examination without abnormal findings: Secondary | ICD-10-CM | POA: Diagnosis not present

## 2021-04-24 DIAGNOSIS — R82998 Other abnormal findings in urine: Secondary | ICD-10-CM | POA: Diagnosis not present

## 2021-04-24 DIAGNOSIS — I509 Heart failure, unspecified: Secondary | ICD-10-CM | POA: Diagnosis not present

## 2021-04-24 DIAGNOSIS — Z1331 Encounter for screening for depression: Secondary | ICD-10-CM | POA: Diagnosis not present

## 2021-04-24 DIAGNOSIS — M1990 Unspecified osteoarthritis, unspecified site: Secondary | ICD-10-CM | POA: Diagnosis not present

## 2021-04-24 DIAGNOSIS — M109 Gout, unspecified: Secondary | ICD-10-CM | POA: Diagnosis not present

## 2021-04-24 DIAGNOSIS — I2510 Atherosclerotic heart disease of native coronary artery without angina pectoris: Secondary | ICD-10-CM | POA: Diagnosis not present

## 2021-04-24 DIAGNOSIS — I480 Paroxysmal atrial fibrillation: Secondary | ICD-10-CM | POA: Diagnosis not present

## 2021-04-24 DIAGNOSIS — Z1339 Encounter for screening examination for other mental health and behavioral disorders: Secondary | ICD-10-CM | POA: Diagnosis not present

## 2021-04-24 DIAGNOSIS — R7301 Impaired fasting glucose: Secondary | ICD-10-CM | POA: Diagnosis not present

## 2021-04-24 DIAGNOSIS — E7800 Pure hypercholesterolemia, unspecified: Secondary | ICD-10-CM | POA: Diagnosis not present

## 2021-04-24 DIAGNOSIS — Z1212 Encounter for screening for malignant neoplasm of rectum: Secondary | ICD-10-CM | POA: Diagnosis not present

## 2021-04-24 DIAGNOSIS — Z7901 Long term (current) use of anticoagulants: Secondary | ICD-10-CM | POA: Diagnosis not present

## 2021-04-24 DIAGNOSIS — I119 Hypertensive heart disease without heart failure: Secondary | ICD-10-CM | POA: Diagnosis not present

## 2021-04-24 LAB — IFOBT (OCCULT BLOOD): IFOBT: NEGATIVE

## 2021-04-28 ENCOUNTER — Other Ambulatory Visit: Payer: Self-pay

## 2021-04-28 ENCOUNTER — Encounter (INDEPENDENT_AMBULATORY_CARE_PROVIDER_SITE_OTHER): Payer: Self-pay | Admitting: Family Medicine

## 2021-04-28 ENCOUNTER — Ambulatory Visit (INDEPENDENT_AMBULATORY_CARE_PROVIDER_SITE_OTHER): Payer: Medicare HMO | Admitting: Family Medicine

## 2021-04-28 VITALS — BP 122/69 | HR 66 | Temp 98.3°F | Ht 69.0 in | Wt 284.0 lb

## 2021-04-28 DIAGNOSIS — Z0289 Encounter for other administrative examinations: Secondary | ICD-10-CM

## 2021-04-28 DIAGNOSIS — Z6841 Body Mass Index (BMI) 40.0 and over, adult: Secondary | ICD-10-CM | POA: Diagnosis not present

## 2021-04-28 DIAGNOSIS — E785 Hyperlipidemia, unspecified: Secondary | ICD-10-CM

## 2021-04-28 DIAGNOSIS — R6889 Other general symptoms and signs: Secondary | ICD-10-CM | POA: Diagnosis not present

## 2021-04-28 DIAGNOSIS — R5383 Other fatigue: Secondary | ICD-10-CM

## 2021-04-28 DIAGNOSIS — I5033 Acute on chronic diastolic (congestive) heart failure: Secondary | ICD-10-CM | POA: Diagnosis not present

## 2021-04-28 DIAGNOSIS — R7303 Prediabetes: Secondary | ICD-10-CM | POA: Diagnosis not present

## 2021-04-28 DIAGNOSIS — Z1331 Encounter for screening for depression: Secondary | ICD-10-CM | POA: Diagnosis not present

## 2021-04-28 DIAGNOSIS — G4733 Obstructive sleep apnea (adult) (pediatric): Secondary | ICD-10-CM

## 2021-04-28 DIAGNOSIS — R0602 Shortness of breath: Secondary | ICD-10-CM

## 2021-04-28 DIAGNOSIS — I10 Essential (primary) hypertension: Secondary | ICD-10-CM | POA: Diagnosis not present

## 2021-04-28 DIAGNOSIS — Z8673 Personal history of transient ischemic attack (TIA), and cerebral infarction without residual deficits: Secondary | ICD-10-CM | POA: Diagnosis not present

## 2021-04-28 DIAGNOSIS — E66813 Obesity, class 3: Secondary | ICD-10-CM

## 2021-04-29 LAB — VITAMIN D 25 HYDROXY (VIT D DEFICIENCY, FRACTURES): Vit D, 25-Hydroxy: 73.3 ng/mL (ref 30.0–100.0)

## 2021-04-29 LAB — FOLATE: Folate: 8 ng/mL (ref >3.0)

## 2021-04-29 LAB — INSULIN, RANDOM: INSULIN: 55.5 u[IU]/mL — ABNORMAL HIGH (ref 2.6–24.9)

## 2021-04-29 LAB — T4, FREE: Free T4: 1.16 ng/dL (ref 0.82–1.77)

## 2021-04-29 LAB — T3: T3, Total: 148 ng/dL (ref 71–180)

## 2021-04-29 LAB — VITAMIN B12: Vitamin B-12: 527 pg/mL (ref 232–1245)

## 2021-04-29 LAB — TSH: TSH: 5.29 u[IU]/mL — ABNORMAL HIGH (ref 0.450–4.500)

## 2021-05-01 ENCOUNTER — Other Ambulatory Visit: Payer: Self-pay

## 2021-05-01 ENCOUNTER — Ambulatory Visit (HOSPITAL_BASED_OUTPATIENT_CLINIC_OR_DEPARTMENT_OTHER): Payer: Medicare HMO | Admitting: Cardiovascular Disease

## 2021-05-01 ENCOUNTER — Encounter (HOSPITAL_BASED_OUTPATIENT_CLINIC_OR_DEPARTMENT_OTHER): Payer: Self-pay | Admitting: Cardiovascular Disease

## 2021-05-01 VITALS — BP 134/64 | HR 62 | Ht 69.0 in | Wt 287.8 lb

## 2021-05-01 DIAGNOSIS — E669 Obesity, unspecified: Secondary | ICD-10-CM | POA: Diagnosis not present

## 2021-05-01 DIAGNOSIS — I480 Paroxysmal atrial fibrillation: Secondary | ICD-10-CM

## 2021-05-01 DIAGNOSIS — I2510 Atherosclerotic heart disease of native coronary artery without angina pectoris: Secondary | ICD-10-CM

## 2021-05-01 DIAGNOSIS — I10 Essential (primary) hypertension: Secondary | ICD-10-CM | POA: Diagnosis not present

## 2021-05-01 DIAGNOSIS — G4733 Obstructive sleep apnea (adult) (pediatric): Secondary | ICD-10-CM | POA: Diagnosis not present

## 2021-05-01 DIAGNOSIS — R600 Localized edema: Secondary | ICD-10-CM

## 2021-05-01 DIAGNOSIS — Z9861 Coronary angioplasty status: Secondary | ICD-10-CM | POA: Diagnosis not present

## 2021-05-01 DIAGNOSIS — E785 Hyperlipidemia, unspecified: Secondary | ICD-10-CM

## 2021-05-01 NOTE — Assessment & Plan Note (Signed)
Blood pressure was mildly elevated today but has been very well have been controlled at home.  In fact he has had some low blood pressures.  We will continue to monitor for now.  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  Continue amlodipine, losartan, metoprolol, and torsemide as needed.

## 2021-05-01 NOTE — Assessment & Plan Note (Signed)
Due to venous stasis.  BNP is normal and no volume overload on echo.  When he tried taking higher doses of torsemide it hurt his kidneys.  Advised him that it is okay for him to take the torsemide as needed when he is feeling more volume overloaded.  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

## 2021-05-01 NOTE — Assessment & Plan Note (Signed)
I am thrilled that he is going to the healthy weight and wellness clinic.  He is very motivated to lose weight.

## 2021-05-01 NOTE — Patient Instructions (Signed)
Medication Instructions:  Your physician recommends that you continue on your current medications as directed. Please refer to the Current Medication list given to you today.   *If you need a refill on your cardiac medications before your next appointment, please call your pharmacy*  Lab Work: NONE  Testing/Procedures: NONE  Follow-Up: At [facility], you and your health needs are our priority.  As part of our continuing mission to provide you with exceptional heart care, we have created designated Provider Care Teams.  These Care Teams include your primary Cardiologist (physician) and Advanced Practice Providers (APPs -  Physician Assistants and Nurse Practitioners) who all work together to provide you with the care you need, when you need it.  We recommend signing up for the patient portal called "MyChart".  Sign up information is provided on this After Visit Summary.  MyChart is used to connect with patients for Virtual Visits (Telemedicine).  Patients are able to view lab/test results, encounter notes, upcoming appointments, etc.  Non-urgent messages can be sent to your provider as well.   To learn more about what you can do with MyChart, go to [email or web address]://[email or web address].com.    Your next appointment:   12 month(s)  The format for your next appointment:   In Person  Provider:   Tiffany [place], MD or Caitlin Walker, NP{         

## 2021-05-01 NOTE — Assessment & Plan Note (Signed)
Lipids well-controlled.  Dr. Osborne Casco checked recently.

## 2021-05-01 NOTE — Assessment & Plan Note (Signed)
He is doing well and has no angina.  Continue clopidogrel and metoprolol.  Lipids well have been controlled on atorvastatin.

## 2021-05-01 NOTE — Assessment & Plan Note (Signed)
Continue CPAP.  

## 2021-05-01 NOTE — Progress Notes (Signed)
Cardiology Office Note   Date:  05/01/2021   ID:  Devon Mathews., DOB [birth year], MRN [id number]  PCP:  Haywood Pao, MD  Cardiologist:  Skeet Latch, MD  Electrophysiologist:  None  Sleep: Dr. Claiborne Billings  Evaluation Performed:  Follow-Up Visit  Chief Complaint:  Follow up  History of Present Illness:    Devon Mathews is a 70 y.o. male with CAD s/p NSTEMI and PCI, paroxysmal atrial fibrillation, procedural pericardial effusion/tamponade s/p pericardial window, mild AR, hypertension, hyperlipidemia, OSA and obestiy who presents for follow up.  Devon Mathews was previously seen by Dr. Rollene Fare and by Dr. Einar Gip.  He last saw Dr. Einar Gip 01/2015 and reported mild, chronic dyspnea. He underwent cardiac catheterization in 2003 and 2006 with a 40% LAD lesion noted.  He had an ETT in 2007 that was negative for ischemia and a nuclear stress in 2011 and 2012 that were negative.  He had an echo 07/02/13 that revealed LVEF 55-60% and mild mitral regurgitation. He was seen in clinic 07/2016 and reported several months of chest pain and exertional shortness of breath.  He was referred for exercise Myoview 08/13/16 that revealed LVEF 63% and no ischemia.  He also had an echo 08/16/16 with LVEF 60-65%, mild LVH, mild AR and mild TR.  Devon Mathews had a sleep study in 2014 that demonstrated moderate OSA. He continues to be compliant with CPAP.     Devon Mathews had an NSTEMI 08/2018.  Troponin peaked at 0.15.  He underwent LHC and had a 95% mid LAD lesion that was successfully stented with a DES.  20% distal left main disease was also noted.  LVEF was 65% on left ventriculogram.  Simvastatin was switched to atorvastatin and metoprolol was increased to 50 mg daily.  Plans were made to continue dual antiplatelet therapy for 1 to 3 months followed by ticagrelor alone.  Since his last appointment Mr. Devon Mathews [place], [place] and reported angina.  He was referred for [facility] 12/01/2018 where there was concern for  reversible ischemia in the anterolateral region and infarct with peri-infarct ischemia in the inferolateral and inferoseptal regions.  He subsequently underwent left heart catheterization 12/05/2018 that revealed a 90% ostial D1 lesion.  He underwent successful balloon angioplasty.  However the procedure was complicated by a diagonal wire perforation with subsequent development of a pericardial effusion and tamponade.  He developed hemorrhagic pericarditis and was started on colchicine.  He was discharged but developed A worsening effusion and early tamponade was noted on his echo 12/2018.  He was admitted and underwent pericardial window.  During that time he also developed atrial fibrillation and had a TIA.  At his last appointment his torsemide was increased because of volume overload. He followed up with Devon lawrence, DNP 02/2021 and his weight was still increasing. Torsemide was again increased. Repeat Echo 03/2021 revealed LVEF 60-65% with grade 1 diastolic dysfunction. BNP had been normal.  Today, he reports doing good and bad, mostly due to the fluid. 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 When taking extra torsemide, he did not notice improvement and had developed some pain around his kidneys. He went back to taking the lower dose of torsemide and his pain improved. Currently, he is taking torsemide about once every other day, he can feel when the fluid is pushing in and he needs to take the medication. He presents a blood pressure log today. One night a few  days ago he was having no energy and felt very fatigued, and he checked his blood pressure and found it to be in the systolic 16X. Typically his blood pressure averages in the 110s, rarely in the 90s or 120s. His most recent labs showed his TSH elevated, but T3 and T4 normal. This week he started the [facility], and believes this will help his weight management. He denies any chest pain,  shortness of breath, palpitations, or exertional symptoms. No headaches, lightheadedness, or syncope to report. Also has no orthopnea or PND.    Past Medical History:  Diagnosis Date   Anxiety    Atrial fibrillation (HCC)    CAD S/P percutaneous coronary angioplasty 09/13/2018    99% p-mLAD (BTW d1&d2) -> SYNERGY DES 3.5 X 12 (3.75 mm). 20% LM.  Cx, small RI & co-dom RCA normal.  EF 65%.    Dizziness 2012   normal findings..   Essential hypertension 07/28/2016   GERD (gastroesophageal reflux disease)    Lower extremity edema 07/28/2016   NSTEMI (non-ST elevated myocardial infarction) ([place]) 09/13/2018   The patient presented 09/13/2018 with a non-ST elevation MI, troponin peak was 0.15   Obesity    Obesity (BMI 30-39.9) 07/28/2016   OSA (obstructive sleep apnea) 07/28/2016   Restless leg syndrome    mild   Shortness of breath 07/28/2016   Squamous cell carcinoma of skin 03/20/2015   right ant scalp(curetx3, 30fu)   Squamous cell carcinoma of skin 01/01/2016   left lateral forehead (curetx3, 5fu)   Squamous cell carcinoma of skin 06/15/2019   left mid anterior scalp (tx afte bx)   Squamous cell carcinoma of skin 01/08/2021   well diff- left forehead   Squamous cell carcinoma of skin 01/08/2021   in situ- mid frontal scalp   Past Surgical History:  Procedure Laterality Date   CORONARY BALLOON ANGIOPLASTY N/A 12/05/2018   Procedure: CORONARY BALLOON ANGIOPLASTY;  Surgeon: [name], MD;  Location: [place] [facility];  Service: Cardiovascular;  Laterality: N/A;   CORONARY STENT INTERVENTION N/A 09/13/2018   Procedure: CORONARY STENT INTERVENTION;  Surgeon: Belva Crome, MD;  Location: [facility];  Service: Cardiovascular;; p-m LAD (btw D1&D2) - SYNERGY DES 3.5 x 12 (3.75 mm).   KNEE ARTHROSCOPY Right    LEFT HEART CATH AND CORONARY ANGIOGRAPHY N/A 09/13/2018   Procedure: LEFT HEART CATH AND CORONARY ANGIOGRAPHY;  Surgeon: Belva Crome, MD;  Location: [place]  [facility];  Service: Cardiovascular;;  99% p-mLAD (BTW d1&d2) -> SYNERGY DES 3.5 X 12 (3.75 mm). 20% LM.  Cx, small RI & co-dom RCA normal.  EF 65%.    LEFT HEART CATH AND CORONARY ANGIOGRAPHY  2003   30-40% proximal segmental stenosis in the LAD   LEFT HEART CATH AND CORONARY ANGIOGRAPHY  2006   EF >50% & no mitral regurgitation   LEFT HEART CATH AND CORONARY ANGIOGRAPHY N/A 12/05/2018   Procedure: LEFT HEART CATH AND CORONARY ANGIOGRAPHY;  Surgeon: [name], MD;  Location: [place] [facility];  Service: Cardiovascular;  Laterality: N/A;   NM MYOVIEW LTD  07/2016   Normal.  EF 55-65% 63%).  No ischemia or infarction.  LOW RISK   NM MYOVIEW LTD  11/2018   6: 34 min.  7.2 METS.  Noted chest pain and tightness.  Horizontal ST of T wave depression (2 mm) II, 3, V5 and V6 (HIGH RISK), large/severe defect basal-apical inferior, inferolateral wall.  Large-severe basal-apical  anterior-anterolateral wall.  Medium/moderate defect in basal and mid inferoseptal wall.  Consistent with large prior infarct with peri-infarct ischemia.  HIGH RISK   PERICARDIOCENTESIS N/A 12/05/2018   Procedure: PERICARDIOCENTESIS;  Surgeon: [name], MD;  Location: [place] [facility];  Service: Cardiovascular;  Laterality: N/A;   SUBXYPHOID PERICARDIAL WINDOW N/A 12/25/2018   Procedure: SUBXYPHOID PERICARDIAL WINDOW;  Surgeon: [name], MD;  Location: [facility];  Service: Thoracic;  Laterality: N/A;   TEE WITHOUT CARDIOVERSION N/A 12/25/2018   Procedure: TRANSESOPHAGEAL ECHOCARDIOGRAM (TEE);  Surgeon: [name], MD;  Location: [facility];  Service: Thoracic;  Laterality: N/A;   TRANSTHORACIC ECHOCARDIOGRAM  07/2016   EF 60-65% with mild LVH.  Mild AI.  Trivial MR and TR.   VASECTOMY       Current Meds  Medication Sig   Acetaminophen (TYLENOL PO) Take 2 tablets by mouth as needed (help releive pressure in his chest).   amitriptyline (ELAVIL) 25 MG tablet Take 12.5 mg by mouth at bedtime as needed for sleep.     atorvastatin (LIPITOR) 80 MG tablet Take 1 tablet (80 mg total) by mouth daily. NEEDS APPT   clopidogrel (PLAVIX) 75 MG tablet TAKE 1 TABLET BY MOUTH EVERY DAY   ELIQUIS 5 MG TABS tablet TAKE 1 TABLET BY MOUTH TWICE A DAY   escitalopram (LEXAPRO) 10 MG tablet Take 10 mg by mouth daily.   losartan (COZAAR) 50 MG tablet Take 1 tablet (50 mg total) by mouth daily.   metoprolol succinate (TOPROL-XL) 50 MG 24 hr tablet TAKE 1 TABLET BY MOUTH EVERY DAY WITH OR IMMEDIATELY FOLLOWING A MEAL   nitroGLYCERIN (NITROSTAT) 0.4 MG SL tablet Place 1 tablet (0.4 mg total) under the tongue every 5 (five) minutes as needed for chest pain.   pantoprazole (PROTONIX) 40 MG tablet TAKE ONE TABLET BY MOUTH ONE TIME DAILY. must make follow up appointment for further refills   potassium chloride 20 MEQ TBCR Take 20 mEq by mouth daily.   torsemide (DEMADEX) 20 MG tablet TAKE 2 TABLETS (40 MG TOTAL) BY MOUTH DAILY.     Allergies:   Penicillins   Social History   Tobacco Use   Smoking status: Never   Smokeless tobacco: Never  Vaping Use   Vaping Use: Never used  Substance Use Topics   Alcohol use: No   Drug use: No     Family Hx: The patient's family history includes COPD in his father; Diabetes in his brother, mother, and sister; Heart attack in his paternal grandmother and sister; Heart disease in his father and mother; Heart failure in his father, mother, and sister; Hypertension in his mother; Other in his father and maternal grandfather; Stroke in his father.  ROS:   Please see the history of present illness.    (+) LE edema (+) Fatigue All other systems reviewed and are negative.   Prior CV studies:   The following studies were reviewed today:  Lexiscan Myoview 12/01/18: Nuclear stress EF: 54%. The left ventricular ejection fraction is mildly decreased (45-54%). Horizontal ST segment depression ST segment depression of 2 mm was noted during stress in the II, III, V5 and V6 leads, beginning at 8  minutes of stressST deviation beginning in recovery. Defect 1: There is a large defect of severe severity present in the basal inferior, basal inferolateral, mid inferior, mid inferolateral, apical inferior, apical lateral and apex location. Defect 2: There is a large defect of severe severity present in the basal anterior, basal  anterolateral, mid anterior, mid anterolateral, apical anterior and apex location. Defect 3: There is a medium defect of moderate severity present in the basal inferoseptal and mid inferoseptal location. Findings consistent with ischemia and prior myocardial infarction with peri-infarct ischemia. This is a high risk study.   There is a medium size, severe perfusion defect in the inferior and inferolateral walls with stress that is partially reversible at rest, returning to moderate. Base to apex. There is a absent, large perfusion defect in the anterior wall from base to apex. It is partially reversible at rest and returns to moderate at the base and mid ventricle and returns to mild at the apex. Anterolateral defect is severe, and with full reversibility. Medium size, moderate perfusion defect in the inferoseptum with partial reversibility, returning to mild at rest.   LHC 12/05/18: SUMMARY Culprit lesion:  90% ostial 1st Diag (jailed) -successful scoring balloon angioplasty using 2.5 mm Wolverine scoring balloon --Distal diagonal wire perforation with pericardial effusion and initial stages of pericardial tamponade --> successful occlusion with prolonged balloon inflations and administration of protamine --Unsuccessful pericardiocentesis Widely patent LAD stent Otherwise stable coronary arteries. Normal LVEF and EDP pre-PCI Clear ST elevations for roughly 15 minutes, most likely type for MI.    Echo 12/06/18: IMPRESSIONS      1. The left ventricle has normal systolic function, with an ejection fraction of 55-60%. The cavity size was normal. Left ventricular  diastolic parameters were normal.  2. Small pericardial effusion seen at the apex, lateral wall and anterior to RV.  3. The mitral valve is degenerative. Mild thickening of the mitral valve leaflet. Mild calcification of the mitral valve leaflet.  4. The aortic valve is tricuspid There is Moderate thickening of the aortic valve There is Mild calcification of the aortic valveAortic valve regurgitation is trivial by color flow Doppler.  Echo 12/21/18:   IMPRESSIONS    1. The left ventricle has hyperdynamic systolic function, with an ejection fraction of >65%. Left ventricular diastology could not be evaluated due to nondiagnostic images.  2. The mitral valve is normal in structure.  3. Aortic valve regurgitation is mild by color flow Doppler.  4. Moderate pericardial effusion.  5. The pericardial effusion is circumferential.  6. There is inversion of the right ventricular wall, inversion of the right atrial wall and excessive respiratory variation in the mitral valve spectral Doppler velocities.  7. When compared to the prior study: Compared to study 11/2018, the pericardial effusion has incrased in size and now significant effusion present posterior as well as anteriorly. There is RA inversion and suggestive of subtle intermittent RV diastolic  collapse. There is some respiratory variation in the [facility] inlow pattern with respirations. The IVC is not well visualized. Concern for early tamponade.     Carotid Doppler 12/21/18: Minimal plaque bilaterally.  CT Angio Chest Aorta 07/10/2020: 1. Negative for acute PE or thoracic aortic dissection. 2. Dilated central pulmonary arteries suggesting pulmonary arterial hypertension. 3. Coronary calcifications. The severity of coronary artery disease and any potential stenosis cannot be assessed on this non-gated CT examination.  Echo 03/27/2021:  1. Left ventricular ejection fraction, by estimation, is 60 to 65%. The  left ventricle has normal function. The  left ventricle has no regional  wall motion abnormalities. Left ventricular diastolic parameters were  normal.   2. Right ventricular systolic function is normal. The right ventricular  size is moderately enlarged. There is normal pulmonary artery systolic  pressure. The estimated right ventricular systolic pressure is  24.5 mmHg.   3. The mitral valve is normal in structure. Trivial mitral valve  regurgitation. No evidence of mitral stenosis.   4. The aortic valve is tricuspid. Aortic valve regurgitation is trivial.  No aortic stenosis is present.   5. The inferior vena cava is normal in size with greater than 50%  respiratory variability, suggesting right atrial pressure of 3 mmHg.   Comparison(s): No significant change from prior study.   Labs/Other Tests and Data Reviewed:    EKG:   05/01/2021: Sinus rhythm. Rate 62 bpm. 1st degree AV block. An ECG dated 12/30/2020 was personally reviewed today and demonstrated:  Sinus rhythm.  PACs.  Rate 70 bpm.  Recent Labs: 06/25/2020: Hemoglobin 14.3; Platelets 186 01/15/2021: BNP 54.1 03/09/2021: BUN 19; Creatinine, Ser 1.30; Potassium 4.4; Sodium 139 04/28/2021: TSH 5.290   Recent Lipid Panel Lab Results  Component Value Date/Time   CHOL 79 12/21/2018 05:28 AM   TRIG 54 12/21/2018 05:28 AM   HDL 26 (L) 12/21/2018 05:28 AM   CHOLHDL 3.0 12/21/2018 05:28 AM   LDLCALC 42 12/21/2018 05:28 AM    Wt Readings from Last 3 Encounters:  05/01/21 287 lb 12.8 oz (130.5 kg)  04/28/21 284 lb (128.8 kg)  02/27/21 295 lb (133.8 kg)     Objective:    VS:  BP 134/64   Pulse 62   Ht 5\' 9"  (1.753 m)   Wt 287 lb 12.8 oz (130.5 kg)   SpO2 96%   BMI 42.50 kg/m  , BMI Body mass index is 42.5 kg/m. GENERAL:  Well appearing HEENT: Pupils equal round and reactive, fundi not visualized, oral mucosa unremarkable NECK:  No JVD.  Waveform within normal limits, carotid upstroke brisk and symmetric, no bruits LUNGS:  Clear to auscultation  bilaterally HEART:  RRR.  PMI not displaced or sustained,S1 and S2 within normal limits, no S3, no S4, no clicks, no rubs, no murmurs ABD:  Flat, positive bowel sounds normal in frequency in pitch, no bruits, no rebound, no guarding, no midline pulsatile mass, no hepatomegaly, no splenomegaly EXT:  2 plus pulses throughout, trace LE edema, no cyanosis no clubbing SKIN:  No rashes no nodules NEURO:  Cranial nerves II through XII grossly intact, motor grossly intact throughout PSYCH:  Cognitively intact, oriented to person place and time   ASSESSMENT & PLAN:   Lower extremity edema Due to venous stasis.  BNP is normal and no volume overload on echo.  When he tried taking higher doses of torsemide it hurt his kidneys.  Advised him that it is okay for him to take the torsemide as needed when he is feeling more volume overloaded.  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  OSA (obstructive sleep apnea) Continue CPAP.  Essential hypertension Blood pressure was mildly elevated today but has been very well have been controlled at home.  In fact he has had some low blood pressures.  We will continue to monitor for now.  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  Continue amlodipine, losartan, metoprolol, and torsemide as needed.  CAD S/P percutaneous coronary angioplasty He is doing well and has no angina.  Continue clopidogrel and metoprolol.  Lipids well have been controlled on atorvastatin.  Hyperlipidemia LDL goal <70 Lipids well-controlled.  Dr. Osborne Casco checked recently.  Obesity (BMI 30-39.9) I am thrilled that he  is going to the healthy weight and wellness clinic.  He is very motivated to lose weight.  Medication Adjustments/Labs and Tests Ordered: Current medicines are reviewed at length with the patient today.  Concerns regarding medicines are outlined  above.   Tests Ordered: Orders Placed This Encounter  Procedures   EKG 12-Lead     Medication Changes: No orders of the defined types were placed in this encounter.   Disposition:   Follow up with Kirklin Mcduffee C. Oval [name], MD, [facility] in 1 year.  I,Mathew Stumpf,acting as a Education administrator for Skeet Latch, MD.,have documented all relevant documentation on the behalf of Skeet Latch, MD,as directed by  Skeet Latch, MD while in the presence of Skeet Latch, MD.  I, Lamar Oval [name], MD have reviewed all documentation for this visit.  The documentation of the exam, diagnosis, procedures, and orders on 05/01/2021 are all accurate and complete.   Signed, Skeet Latch, MD  05/01/2021 6:02 PM    [facility]

## 2021-05-06 ENCOUNTER — Encounter: Payer: Self-pay | Admitting: Physician Assistant

## 2021-05-06 ENCOUNTER — Other Ambulatory Visit: Payer: Self-pay

## 2021-05-06 ENCOUNTER — Ambulatory Visit (INDEPENDENT_AMBULATORY_CARE_PROVIDER_SITE_OTHER): Payer: Medicare HMO | Admitting: Physician Assistant

## 2021-05-06 DIAGNOSIS — D0439 Carcinoma in situ of skin of other parts of face: Secondary | ICD-10-CM | POA: Diagnosis not present

## 2021-05-06 DIAGNOSIS — C44320 Squamous cell carcinoma of skin of unspecified parts of face: Secondary | ICD-10-CM

## 2021-05-06 DIAGNOSIS — C44329 Squamous cell carcinoma of skin of other parts of face: Secondary | ICD-10-CM

## 2021-05-06 NOTE — Patient Instructions (Signed)

## 2021-05-06 NOTE — Progress Notes (Signed)
   Follow-Up Visit   Subjective  Devon Mathews. is a 70 y.o. male who presents for the following: Procedure (FOREHEAD AND SCALP SCC ).   The following portions of the chart were reviewed this encounter and updated as appropriate:  Tobacco  Allergies  Meds  Problems  Med Hx  Surg Hx  Fam Hx      Objective  Well appearing patient in no apparent distress; mood and affect are within normal limits.  A focused examination was performed including face. Relevant physical exam findings are noted in the Assessment and Plan.  Left Forehead White horn on a pink base. Removed the top and there was a full thickness root.  Lesion identified by [facility] Schnyder and nurse in room.   4 suture see photo     Assessment & Plan  SCC (squamous cell carcinoma), face Left Forehead  Skin excision  Total excision diameter (cm):  2 Informed consent: discussed and consent obtained   Timeout: patient name, date of birth, surgical site, and procedure verified   Anesthesia: the lesion was anesthetized in a standard fashion   Anesthetic:  1% lidocaine w/ epinephrine 1-100,000 local infiltration Instrument used: #15 blade   Hemostasis achieved with: pressure and electrodesiccation   Outcome: patient tolerated procedure well with no complications   Post-procedure details: sterile dressing applied and wound care instructions given   Dressing type: bandage, petrolatum and pressure dressing    Skin repair Complexity:  Intermediate Final length (cm):  2 Informed consent: discussed and consent obtained   Timeout: patient name, date of birth, surgical site, and procedure verified   Procedure prep:  Patient was prepped and draped in usual sterile fashion Prep type:  Chlorhexidine Anesthesia: the lesion was anesthetized in a standard fashion   Reason for type of repair: reduce tension to allow closure and reduce the risk of dehiscence, infection, and necrosis   Undermining: area extensively undermined    Subcutaneous layers (deep stitches):  Suture size:  3-0 Suture type: Vicryl (polyglactin 910)   Stitches:  Buried horizontal mattress Fine/surface layer approximation (top stitches):  Suture size:  3-0 Suture type: Prolene (polypropylene)   Stitches: horizontal mattress   Suture removal (days):  7 Hemostasis achieved with: suture, pressure and electrodesiccation Outcome: patient tolerated procedure well with no complications   Post-procedure details: wound care instructions given    Specimen 1 - Surgical pathology Differential Diagnosis: scc well diff  Check Margins: No   I, Euan Wandler, PA-C, have reviewed all documentation's for this visit.  The documentation on 05/06/21 for the exam, diagnosis, procedures and orders are all accurate and complete.

## 2021-05-07 ENCOUNTER — Encounter (INDEPENDENT_AMBULATORY_CARE_PROVIDER_SITE_OTHER): Payer: Self-pay

## 2021-05-11 NOTE — Progress Notes (Signed)
Chief Complaint:   OBESITY Devon Mathews (MR# [id number]) is a 70 y.o. male who presents for evaluation and treatment of obesity and related comorbidities. Current BMI is Body mass index is 41.94 kg/m. Devon Mathews has been struggling with his weight for many years and has been unsuccessful in either losing weight, maintaining weight loss, or reaching his healthy weight goal.  Devon Mathews is currently in the action stage of change and ready to dedicate time achieving and maintaining a healthier weight. Devon Mathews is interested in becoming our patient and working on intensive lifestyle modifications including (but not limited to) diet and exercise for weight loss.  Devon Mathews lives with his wife, Devon Mathews.  He is retired.  He denies any cravings.  Skips lunch 4-5 times per week on average.  Drinks caloric beverages.  Snacks on Oreo cookies and ice cream.  Worst habit is eating hamburgers.  Devon Mathews had fasting blood work at [facility] on 04/17/2021 including A1c, FLP, CBC, and CMP (recorded into chart).  Devon Mathews's habits were reviewed today and are as follows: His family eats meals together, his desired weight loss is 90 pounds, he started gaining weight at age 59, his heaviest weight ever was 295 pounds, he snacks frequently in the evenings, he wakes up frequently in the middle of the night to eat, he skips lunch frequently, he is frequently drinking liquids with calories, he frequently makes poor food choices, and he struggles with emotional eating.  Depression Screen Devon Mathews's Food and Mood (modified PHQ-9) score was 10.  Depression screen [name] 2/9 04/28/2021  Decreased Interest 3  Down, Depressed, Hopeless 1  PHQ - 2 Score 4  Altered sleeping 0  Tired, decreased energy 3  Change in appetite 0  Feeling bad or failure about yourself  1  Trouble concentrating 1  Moving slowly or fidgety/restless 1  Suicidal thoughts 0  PHQ-9 Score 10  Difficult doing work/chores Somewhat difficult   Assessment/Plan:   Orders Placed This Encounter   Procedures   Insulin, random   VITAMIN D 25 Hydroxy (Vit-D Deficiency, Fractures)   Vitamin B12   Folate   T3   T4, free   TSH   1. Other fatigue Devon Mathews denies daytime somnolence and denies waking up still tired. Patent has a history of symptoms of snoring when not on his CPAP. Devon Mathews generally gets 6 or 7 hours of sleep per night, and states that he has generally restful sleep. Snoring is not present. Apneic episodes are not present. Epworth Sleepiness Score is 4.  Devon Mathews does feel that his weight is causing his energy to be lower than it should be. Fatigue may be related to obesity, depression or many other causes. Labs will be ordered, and in the meanwhile, Devon Mathews will focus on self care including making healthy food choices, increasing physical activity and focusing on stress reduction.  Check labs today.  - VITAMIN D 25 Hydroxy (Vit-D Deficiency, Fractures) - Vitamin B12 - Folate - T3 - T4, free - TSH  2. Shortness of breath on exertion Devon Mathews notes increasing shortness of breath with exercising and seems to be worsening over time with weight gain. He notes getting out of breath sooner with activity than he used to. This has gotten worse recently. Devon Mathews denies shortness of breath at rest or orthopnea.  Devon Mathews does feel that he gets out of breath more easily that he used to when he exercises. Devon Mathews's shortness of breath appears to be obesity related and exercise induced. He has agreed  to work on weight loss and gradually increase exercise to treat his exercise induced shortness of breath. Will continue to monitor closely.  Check IC today.  3. Prediabetes Not at goal. Goal is HgbA1c < 5.7.  Medication: None.  On 04/17/2021, A1c was 6.2.  Plan:  He will continue to focus on protein-rich, low simple carbohydrate foods. We reviewed the importance of hydration, regular exercise for stress reduction, and restorative sleep.   Lab Results  Component Value Date   HGBA1C 6.2 04/17/2021   Lab Results   Component Value Date   INSULIN 55.5 (H) 04/28/2021   - Insulin, random  4. Hyperlipidemia LDL goal <70 Course: Controlled. Lipid-lowering medications: Lipitor 80 mg daily. On 04/17/2021, HDL was low at 33, LDL 85, TG 106.  In November 2019, he had an AMI with stent placement.  Never did cardiac rehab.  Six months later, had a CVA, and 3-6 months after that, was told he had heart failure.  Plan: Dietary changes: Increase soluble fiber, decrease simple carbohydrates, decrease saturated fat. Exercise changes: Moderate to vigorous-intensity aerobic activity 150 minutes per week or as tolerated. We will continue to monitor along with PCP/specialists as it pertains to his weight loss journey.  Lab Results  Component Value Date   CHOL 139 04/17/2021   HDL 33 (A) 04/17/2021   LDLCALC 85 04/17/2021   TRIG 106 04/17/2021   CHOLHDL 3.0 12/21/2018   Lab Results  Component Value Date   ALT 38 04/17/2021   AST 28 04/17/2021   ALKPHOS 73 04/17/2021   BILITOT 1.1 12/20/2018   5. Essential hypertension At goal. Medications: Norvasc 5 mg daily, Cozaar 50 mg daily, metoprolol 50 mg daily, torsemide 40 mg daily.  Serum creatinine 1.3 on 04/17/2021, and otherwise within normal limits except for elevated fasting blood glucose and recently in 12/2020, fluid overload and his torsemide was doubled by his cardiologist.  He has a follow-up with them in less than a week.  Plan:  At goal.  Avoid buying foods that are: processed, frozen, or prepackaged to avoid excess salt. We will watch for signs of hypotension as he continues lifestyle modifications. We will continue to monitor closely alongside his PCP and/or Specialist.  Regular follow up with PCP and specialists was also encouraged.   BP Readings from Last 3 Encounters:  05/01/21 134/64  04/28/21 122/69  02/27/21 130/60   Lab Results  Component Value Date   CREATININE 1.3 04/17/2021   - T3 - T4, free - TSH  6. OSA (obstructive sleep apnea) On CPAP  nightly and even uses with naps during the day.  Plan:  Controlled.  Great compliance.  Continue treatment plan.  OSA is a cause of systemic hypertension and is associated with an increased incidence of stroke, heart failure, atrial fibrillation, and coronary heart disease. Severe OSA increases all-cause mortality and cardiovascular mortality.   Goal: Treatment of OSA via CPAP compliance and weight loss. Plasma ghrelin levels (appetite or "hunger hormone") are significantly higher in OSA patients than in BMI-matched controls, but decrease to levels similar to those of obese patients without OSA after CPAP treatment.  Weight loss improves OSA by several mechanisms, including reduction in fatty tissue in the throat (i.e. parapharyngeal fat) and the tongue. Loss of abdominal fat increases mediastinal traction on the upper airway making it less likely to collapse during sleep. Studies have also shown that compliance with CPAP treatment improves leptin (hunger inhibitory hormone) imbalance.  7. Acute on chronic diastolic (congestive) heart failure (  [place]) Per patient, Cardiology does not have him on a specific fluid amount per day.  Status stable currently and very little water retention currently.  Plan:  Follow-up with Cardiology in less than 1 week and ask about how many ounces of water to drink per day.  Talk about your use of Devon Mathews as needed versus twice daily as written.  Check labs today.  - VITAMIN D 25 Hydroxy (Vit-D Deficiency, Fractures) - Vitamin B12 - Folate - T3 - T4, free - TSH  8. Depression screening Devon Mathews was screened for depression as part of his new patient paperwork today.  PHQ-9 is 10.  Devon Mathews had a positive depression screening. Depression is commonly associated with obesity and often results in emotional eating behaviors. We will monitor this closely and work on CBT to help improve the non-hunger eating patterns. Referral to Psychology may be required if no improvement is seen  as he continues in our clinic.  9. Class 3 severe obesity with serious comorbidity and body mass index (BMI) of 40.0 to 44.9 in adult, unspecified obesity type [facility])  Devon Mathews is currently in the action stage of change and his goal is to continue with weight loss efforts. I recommend Caston begin the structured treatment plan as follows:  He has agreed to the Category 3 Plan.  Exercise goals:  As is.    Behavioral modification strategies: decreasing liquid calories, decreasing sodium intake, and planning for success.  He was informed of the importance of frequent follow-up visits to maximize his success with intensive lifestyle modifications for his multiple health conditions. He was informed we would discuss his lab results at his next visit unless there is a critical issue that needs to be addressed sooner. Devon Mathews agreed to keep his next visit at the agreed upon time to discuss these results.  Objective:   Blood pressure 122/69, pulse 66, temperature 98.3 F (36.8 C), height '5\' 9"'$  (1.753 m), weight 284 lb (128.8 kg), SpO2 96 %. Body mass index is 41.94 kg/m.  Indirect Calorimeter completed today shows a VO2 of 331 and a REE of 2307.  His calculated basal metabolic rate is [id number] thus his basal metabolic rate is better than expected.  General: Cooperative, alert, well developed, in no acute distress. HEENT: Conjunctivae and lids unremarkable. Cardiovascular: Regular rhythm.  Lungs: Normal work of breathing. Neurologic: No focal deficits.   Lab Results  Component Value Date   CREATININE 1.3 04/17/2021   BUN 19 04/17/2021   NA 140 04/17/2021   K 4.5 04/17/2021   CL 100 03/09/2021   CO2 26 (A) 04/17/2021   Lab Results  Component Value Date   ALT 38 04/17/2021   AST 28 04/17/2021   ALKPHOS 73 04/17/2021   BILITOT 1.1 12/20/2018   Lab Results  Component Value Date   HGBA1C 6.2 04/17/2021   HGBA1C 6.9 (H) 03/09/2021   HGBA1C 5.5 12/21/2018   HGBA1C 5.2 09/14/2018   HGBA1C 5.7 (H)  06/27/2013   Lab Results  Component Value Date   INSULIN 55.5 (H) 04/28/2021   Lab Results  Component Value Date   TSH 5.290 (H) 04/28/2021   Lab Results  Component Value Date   CHOL 139 04/17/2021   HDL 33 (A) 04/17/2021   LDLCALC 85 04/17/2021   TRIG 106 04/17/2021   CHOLHDL 3.0 12/21/2018   Lab Results  Component Value Date   VD25OH 73.3 04/28/2021   Lab Results  Component Value Date   WBC 4.8 04/17/2021   HGB 12.8 (  A) 04/17/2021   HCT 37 (A) 04/17/2021   MCV 98 (H) 06/25/2020   PLT 173 04/17/2021   Attestation Statements:   This is the patient's first visit at [facility]. The patient's NEW PATIENT PACKET was reviewed at length. Included in the packet: current and past health history, medications, allergies, ROS, gynecologic history (women only), surgical history, family history, social history, weight history, weight loss surgery history (for those that have had weight loss surgery), nutritional evaluation, mood and food questionnaire, PHQ9, Epworth questionnaire, sleep habits questionnaire, patient life and health improvement goals questionnaire. These will all be scanned into the patient's chart under media.   During the visit, I independently reviewed the patient's EKG, bioimpedance scale results, and indirect calorimeter results. I used this information to tailor a meal plan for the patient that will help him to lose weight and will improve his obesity-related conditions going forward. I performed a medically necessary appropriate examination and/or evaluation. I discussed the assessment and treatment plan with the patient. The patient was provided an opportunity to ask questions and all were answered. The patient agreed with the plan and demonstrated an understanding of the instructions. Labs were ordered at this visit and will be reviewed at the next visit unless more critical results need to be addressed immediately. Clinical information was updated and  documented in the EMR.   Time spent on visit including pre-visit chart review and post-visit charting and care was >60 minutes.   I, Water quality scientist, CMA, am acting as Location manager for [employer], DO.  I have reviewed the above documentation for accuracy and completeness, and I agree with the above. Marjory Sneddon, D.O.  The [place] was signed into law in 2016 which includes the topic of electronic health records.  This provides immediate access to information in MyChart.  This includes consultation notes, operative notes, office notes, lab results and pathology reports.  If you have any questions about what you read please let us know at your next visit so we can discuss your concerns and take corrective action if need be.  We are right here with you.

## 2021-05-12 ENCOUNTER — Other Ambulatory Visit: Payer: Self-pay

## 2021-05-12 ENCOUNTER — Ambulatory Visit (INDEPENDENT_AMBULATORY_CARE_PROVIDER_SITE_OTHER): Payer: Medicare HMO | Admitting: Family Medicine

## 2021-05-12 ENCOUNTER — Encounter (INDEPENDENT_AMBULATORY_CARE_PROVIDER_SITE_OTHER): Payer: Self-pay | Admitting: Family Medicine

## 2021-05-12 VITALS — BP 118/64 | HR 60 | Temp 98.5°F | Ht 69.0 in | Wt 280.0 lb

## 2021-05-12 DIAGNOSIS — I10 Essential (primary) hypertension: Secondary | ICD-10-CM | POA: Diagnosis not present

## 2021-05-12 DIAGNOSIS — Z9189 Other specified personal risk factors, not elsewhere classified: Secondary | ICD-10-CM | POA: Diagnosis not present

## 2021-05-12 DIAGNOSIS — E038 Other specified hypothyroidism: Secondary | ICD-10-CM

## 2021-05-12 DIAGNOSIS — E559 Vitamin D deficiency, unspecified: Secondary | ICD-10-CM

## 2021-05-12 DIAGNOSIS — Z6841 Body Mass Index (BMI) 40.0 and over, adult: Secondary | ICD-10-CM | POA: Diagnosis not present

## 2021-05-12 DIAGNOSIS — R7303 Prediabetes: Secondary | ICD-10-CM | POA: Diagnosis not present

## 2021-05-12 DIAGNOSIS — E7849 Other hyperlipidemia: Secondary | ICD-10-CM | POA: Diagnosis not present

## 2021-05-13 ENCOUNTER — Encounter: Payer: Self-pay | Admitting: Physician Assistant

## 2021-05-13 ENCOUNTER — Ambulatory Visit: Payer: Medicare HMO | Admitting: Physician Assistant

## 2021-05-13 ENCOUNTER — Other Ambulatory Visit: Payer: Self-pay

## 2021-05-13 DIAGNOSIS — Z85828 Personal history of other malignant neoplasm of skin: Secondary | ICD-10-CM

## 2021-05-13 DIAGNOSIS — Z8589 Personal history of malignant neoplasm of other organs and systems: Secondary | ICD-10-CM

## 2021-05-13 DIAGNOSIS — Z4802 Encounter for removal of sutures: Secondary | ICD-10-CM

## 2021-05-13 NOTE — Progress Notes (Signed)
   Follow-Up Visit   Subjective  Devon Mathews. is a 69 y.o. male who presents for the following: Follow-up (SCC on forehead. Here for suture removal. ).   The following portions of the chart were reviewed this encounter and updated as appropriate:  Tobacco  Allergies  Meds  Problems  Med Hx  Surg Hx  Fam Hx      Objective  Well appearing patient in no apparent distress; mood and affect are within normal limits.  A focused examination was performed including face. Relevant physical exam findings are noted in the Assessment and Plan.  forehead Positive granulation tissue with center not approximated and a 2 mm defect. No signs of infection.    Assessment & Plan  History of squamous cell carcinoma forehead  Applied two large steri strips after removing the sutures. Cut the ends off as they detach from the skin. Call if problems.    I, Ihor Meinzer, PA-C, have reviewed all documentation's for this visit.  The documentation on 05/13/21 for the exam, diagnosis, procedures and orders are all accurate and complete.

## 2021-05-13 NOTE — Progress Notes (Signed)
Here for suture removal. No signs or symptoms of infection. Path to patient.

## 2021-05-13 NOTE — Patient Instructions (Signed)

## 2021-05-17 NOTE — Progress Notes (Signed)
Chief Complaint:   OBESITY Devon Mathews is here to discuss his progress with his obesity treatment plan along with follow-up of his obesity related diagnoses. Devon Mathews is on the Category 3 Plan and states he is following his eating plan approximately 90% of the time. Devon Mathews states he is not currently exercising.  Today's visit was #: 2 Starting weight: 284 lbs Starting date: 04/28/2021 Today's weight: 280 lbs Today's date: 05/12/2021 Total lbs lost to date: 4 Total lbs lost since last in-office visit: 4  Interim History: Devon Mathews ate all foods on plan but reports it was a lot to eat. He denies hunger or cravings. He did eat strawberry shortcake once. Pt did not weigh dinner proteins. He only ate out 3 times in the past 2 weeks. He did not measure vegetables.  Subjective:   1. Other hyperlipidemia Discussed labs with patient today. Devon Mathews has hyperlipidemia and has been trying to improve his cholesterol levels with intensive lifestyle modification including a low saturated fat diet, exercise and weight loss. He denies any chest pain, claudication or myalgias.  Lab Results  Component Value Date   ALT 38 04/17/2021   AST 28 04/17/2021   ALKPHOS 73 04/17/2021   BILITOT 1.1 12/20/2018   Lab Results  Component Value Date   CHOL 139 04/17/2021   HDL 33 (A) 04/17/2021   LDLCALC 85 04/17/2021   TRIG 106 04/17/2021   CHOLHDL 3.0 12/21/2018   2. Pre-diabetes Discussed labs with patient today. Devon Mathews has an A1c of 6.2. He denies sweet cravings and is not on medication. He has a diagnosis of prediabetes based on his elevated HgA1c and was informed this puts him at greater risk of developing diabetes. He continues to work on diet and exercise to decrease his risk of diabetes. He denies nausea or hypoglycemia.  Lab Results  Component Value Date   HGBA1C 6.2 04/17/2021   Lab Results  Component Value Date   INSULIN 55.5 (H) 04/28/2021   3. Essential hypertension Discussed labs with patient today. Devon Mathews's  serum creatinine is 1.3 and stable.  BP Readings from Last 3 Encounters:  05/12/21 118/64  05/01/21 134/64  04/28/21 122/69   Lab Results  Component Value Date   CREATININE 1.3 04/17/2021   CREATININE 1.30 (H) 03/09/2021   CREATININE 1.09 02/13/2021   4. Subclinical hypothyroidism Devon Mathews has a slightly elevated TSH and normal T3 and Free T4.   Lab Results  Component Value Date   TSH 5.290 (H) 04/28/2021   5. Vitamin D deficiency He is currently taking OTC vitamin D 5,000 IU each day. He denies nausea, vomiting or muscle weakness.  Lab Results  Component Value Date   VD25OH 73.3 04/28/2021   6. At risk for diabetes mellitus Devon Mathews is at higher than average risk for developing diabetes due to obesity.   Assessment/Plan:  No orders of the defined types were placed in this encounter.   There are no discontinued medications.   No orders of the defined types were placed in this encounter.    1. Other hyperlipidemia Levels at goal essentially. Cardiovascular risk and specific lipid/LDL goals reviewed.  We discussed several lifestyle modifications today and Devon Mathews will continue to work on diet, exercise and weight loss efforts. Orders and follow up as documented in patient record.   Counseling Intensive lifestyle modifications are the first line treatment for this issue. Dietary changes: Increase soluble fiber. Decrease simple carbohydrates. Exercise changes: Moderate to vigorous-intensity aerobic activity 150 minutes per week if tolerated.  Lipid-lowering medications: see documented in medical record.  2. Pre-diabetes Devon Mathews will continue to work on weight loss, exercise, and decreasing simple carbohydrates to help decrease the risk of diabetes. Consider medication in the future. Metformin and Pre-diabetes handouts provided. Creatinine 1.3 currently.  3. Essential hypertension Devon Mathews's BP is at goal. He is working on healthy weight loss and exercise to improve blood pressure control. We  will watch for signs of hypotension as he continues his lifestyle modifications.   4. Subclinical hypothyroidism Patient with long-standing hypothyroidism, on levothyroxine therapy. He appears euthyroid. Orders and follow up as documented in patient record. We will recheck labs in 3 months or sooner if pt develops symptoms.   Counseling Good thyroid control is important for overall health. Supratherapeutic thyroid levels are dangerous and will not improve weight loss results. The correct way to take levothyroxine is fasting, with water, separated by at least 30 minutes from breakfast, and separated by more than 4 hours from calcium, iron, multivitamins, acid reflux medications (PPIs).   5. Vitamin D deficiency Low Vitamin D level contributes to fatigue and are associated with obesity, breast, and colon cancer. He agrees to continue to take OTC Vitamin D 5,000 IU QD and will follow-up for routine testing of Vitamin D, at least 2-3 times per year to avoid over-replacement.  6. At risk for diabetes mellitus Devon Mathews was given approximately 23 minutes of diabetes education and counseling today. We discussed intensive lifestyle modifications today with an emphasis on weight loss as well as increasing exercise and decreasing simple carbohydrates in his diet. We also reviewed medication options with an emphasis on risk versus benefit of those discussed.   Repetitive spaced learning was employed today to elicit superior memory formation and behavioral change.  7. Obesity with current BMI of 41.4  Devon Mathews is currently in the action stage of change. As such, his goal is to continue with weight loss efforts. He has agreed to the Category 3 Plan with breakfast options.   Weigh protein and measure vegetables. Discussed bread alternatives with pt.  Exercise goals:  As is  Behavioral modification strategies: increasing lean protein intake and decreasing simple carbohydrates.  Devon Mathews has agreed to follow-up with our  clinic in 2 weeks. He was informed of the importance of frequent follow-up visits to maximize his success with intensive lifestyle modifications for his multiple health conditions.   Objective:   Blood pressure 118/64, pulse 60, temperature 98.5 F (36.9 C), height '5\' 9"'$  (1.753 m), weight 280 lb (127 kg), SpO2 96 %. Body mass index is 41.35 kg/m.  General: Cooperative, alert, well developed, in no acute distress. HEENT: Conjunctivae and lids unremarkable. Cardiovascular: Regular rhythm.  Lungs: Normal work of breathing. Neurologic: No focal deficits.   Lab Results  Component Value Date   CREATININE 1.3 04/17/2021   BUN 19 04/17/2021   NA 140 04/17/2021   K 4.5 04/17/2021   CL 100 03/09/2021   CO2 26 (A) 04/17/2021   Lab Results  Component Value Date   ALT 38 04/17/2021   AST 28 04/17/2021   ALKPHOS 73 04/17/2021   BILITOT 1.1 12/20/2018   Lab Results  Component Value Date   HGBA1C 6.2 04/17/2021   HGBA1C 6.9 (H) 03/09/2021   HGBA1C 5.5 12/21/2018   HGBA1C 5.2 09/14/2018   HGBA1C 5.7 (H) 06/27/2013   Lab Results  Component Value Date   INSULIN 55.5 (H) 04/28/2021   Lab Results  Component Value Date   TSH 5.290 (H) 04/28/2021   Lab  Results  Component Value Date   CHOL 139 04/17/2021   HDL 33 (A) 04/17/2021   LDLCALC 85 04/17/2021   TRIG 106 04/17/2021   CHOLHDL 3.0 12/21/2018   Lab Results  Component Value Date   VD25OH 73.3 04/28/2021   Lab Results  Component Value Date   WBC 4.8 04/17/2021   HGB 12.8 (A) 04/17/2021   HCT 37 (A) 04/17/2021   MCV 98 (H) 06/25/2020   PLT 173 04/17/2021    Attestation Statements:   Reviewed by clinician on day of visit: allergies, medications, problem list, medical history, surgical history, family history, social history, and previous encounter notes.  Coral Ceo, CMA, am acting as transcriptionist for [employer], DO.  I have reviewed the above documentation for accuracy and completeness, and I agree  with the above. Marjory Sneddon, D.O.  The [place] was signed into law in 2016 which includes the topic of electronic health records.  This provides immediate access to information in MyChart.  This includes consultation notes, operative notes, office notes, lab results and pathology reports.  If you have any questions about what you read please let us know at your next visit so we can discuss your concerns and take corrective action if need be.  We are right here with you.

## 2021-05-26 ENCOUNTER — Ambulatory Visit (INDEPENDENT_AMBULATORY_CARE_PROVIDER_SITE_OTHER): Payer: Medicare HMO | Admitting: Family Medicine

## 2021-05-26 ENCOUNTER — Other Ambulatory Visit: Payer: Self-pay

## 2021-05-26 ENCOUNTER — Encounter (INDEPENDENT_AMBULATORY_CARE_PROVIDER_SITE_OTHER): Payer: Self-pay | Admitting: Family Medicine

## 2021-05-26 VITALS — BP 118/64 | HR 56 | Temp 98.2°F | Ht 69.0 in | Wt 272.0 lb

## 2021-05-26 DIAGNOSIS — R7303 Prediabetes: Secondary | ICD-10-CM

## 2021-05-26 DIAGNOSIS — G2581 Restless legs syndrome: Secondary | ICD-10-CM

## 2021-05-26 DIAGNOSIS — Z6841 Body Mass Index (BMI) 40.0 and over, adult: Secondary | ICD-10-CM

## 2021-05-26 MED ORDER — AMITRIPTYLINE HCL 25 MG PO TABS: 12.5000 mg | ORAL_TABLET | Freq: Every evening | ORAL | 0 refills | Status: AC | PRN

## 2021-05-27 ENCOUNTER — Ambulatory Visit (INDEPENDENT_AMBULATORY_CARE_PROVIDER_SITE_OTHER): Payer: Medicare HMO | Admitting: Family Medicine

## 2021-05-27 ENCOUNTER — Encounter (INDEPENDENT_AMBULATORY_CARE_PROVIDER_SITE_OTHER): Payer: Self-pay

## 2021-05-27 NOTE — Progress Notes (Signed)
Chief Complaint:   OBESITY Keziah is here to discuss his progress with his obesity treatment plan along with follow-up of his obesity related diagnoses. Torie is on the Category 3 Plan with breakfast options and states he is following his eating plan approximately 85-90% of the time. Zay states he is doing 0 minutes 0 times per week.  Today's visit was #: 3 Starting weight: 284 lbs Starting date: 04/28/2021 Today's weight: 272 lbs Today's date: 05/26/2021 Total lbs lost to date: 12 Total lbs lost since last in-office visit: 8  Interim History: Naftali has been pretty spot on the plan. He denies excessive bread, pasta, or any significant modified. He would like to continue with Category 3 plan. He is doing snacks in with breakfast and lunch. He denies cravings. He is getting close to the 8-10 oz of meat. He denies hunger. At night, he is eating Keto ice cream.  Subjective:   1. Pre-diabetes Stclair's last A1c was 6.2, and insulin level of 55.5. He is not on medications.  2. Restless leg syndrome Martial is very occasionally taking his medication due to symptoms. He uses baby calming lotion.  Assessment/Plan:   1. Pre-diabetes Erioluwa will continue his current meal plan, weight loss, exercise, and decreasing simple carbohydrates to help decrease the risk of diabetes. We will repeat labs in 3 months.  2. Restless leg syndrome [name] will continue amitriptyline, and we will refill for 1 month.  - amitriptyline (ELAVIL) 25 MG tablet; Take 0.5 tablets (12.5 mg total) by mouth at bedtime as needed for sleep.  Dispense: 30 tablet; Refill: 0  3. Obesity with current BMI of 40.3 Vayden is currently in the action stage of change. As such, his goal is to continue with weight loss efforts. He has agreed to the Category 3 Plan.   Exercise goals: No exercise has been prescribed at this time.  Behavioral modification strategies: increasing lean protein intake, meal planning and cooking strategies, keeping healthy  foods in the home, and planning for success.  Cyrus has agreed to follow-up with our clinic in 2 weeks. He was informed of the importance of frequent follow-up visits to maximize his success with intensive lifestyle modifications for his multiple health conditions.   Objective:   Blood pressure 118/64, pulse (!) 56, temperature 98.2 F (36.8 C), height '5\' 9"'$  (1.753 m), weight 272 lb (123.4 kg), SpO2 96 %. Body mass index is 40.17 kg/m.  General: Cooperative, alert, well developed, in no acute distress. HEENT: Conjunctivae and lids unremarkable. Cardiovascular: Regular rhythm.  Lungs: Normal work of breathing. Neurologic: No focal deficits.   Lab Results  Component Value Date   CREATININE 1.3 04/17/2021   BUN 19 04/17/2021   NA 140 04/17/2021   K 4.5 04/17/2021   CL 100 03/09/2021   CO2 26 (A) 04/17/2021   Lab Results  Component Value Date   ALT 38 04/17/2021   AST 28 04/17/2021   ALKPHOS 73 04/17/2021   BILITOT 1.1 12/20/2018   Lab Results  Component Value Date   HGBA1C 6.2 04/17/2021   HGBA1C 6.9 (H) 03/09/2021   HGBA1C 5.5 12/21/2018   HGBA1C 5.2 09/14/2018   HGBA1C 5.7 (H) 06/27/2013   Lab Results  Component Value Date   INSULIN 55.5 (H) 04/28/2021   Lab Results  Component Value Date   TSH 5.290 (H) 04/28/2021   Lab Results  Component Value Date   CHOL 139 04/17/2021   HDL 33 (A) 04/17/2021   LDLCALC 85 04/17/2021  TRIG 106 04/17/2021   CHOLHDL 3.0 12/21/2018   Lab Results  Component Value Date   VD25OH 73.3 04/28/2021   Lab Results  Component Value Date   WBC 4.8 04/17/2021   HGB 12.8 (A) 04/17/2021   HCT 37 (A) 04/17/2021   MCV 98 (H) 06/25/2020   PLT 173 04/17/2021   No results found for: IRON, TIBC, FERRITIN  Obesity Behavioral Intervention:   Approximately 15 minutes were spent on the discussion below.  ASK: We discussed the diagnosis of obesity with Carloyn Manner today and Cruse agreed to give [place] permission to discuss obesity behavioral  modification therapy today.  ASSESS: Vallie has the diagnosis of obesity and his BMI today is 40.15. Jaray is in the action stage of change.   ADVISE: Sedarius was educated on the multiple health risks of obesity as well as the benefit of weight loss to improve his health. He was advised of the need for long term treatment and the importance of lifestyle modifications to improve his current health and to decrease his risk of future health problems.  AGREE: Multiple dietary modification options and treatment options were discussed and Josaiah agreed to follow the recommendations documented in the above note.  ARRANGE: Oji was educated on the importance of frequent visits to treat obesity as outlined per CMS and USPSTF guidelines and agreed to schedule his next follow up appointment today.  Attestation Statements:   Reviewed by clinician on day of visit: allergies, medications, problem list, medical history, surgical history, family history, social history, and previous encounter notes.   I, Trixie Dredge, am acting as transcriptionist for Coralie Common, MD. I have reviewed the above documentation for accuracy and completeness, and I agree with the above. - Coralie Common, MD

## 2021-06-10 ENCOUNTER — Other Ambulatory Visit: Payer: Self-pay

## 2021-06-10 ENCOUNTER — Ambulatory Visit (INDEPENDENT_AMBULATORY_CARE_PROVIDER_SITE_OTHER): Payer: Medicare HMO | Admitting: Family Medicine

## 2021-06-10 ENCOUNTER — Encounter (INDEPENDENT_AMBULATORY_CARE_PROVIDER_SITE_OTHER): Payer: Self-pay | Admitting: Family Medicine

## 2021-06-10 VITALS — BP 119/65 | HR 54 | Temp 97.9°F | Ht 69.0 in | Wt 266.0 lb

## 2021-06-10 DIAGNOSIS — Z6841 Body Mass Index (BMI) 40.0 and over, adult: Secondary | ICD-10-CM | POA: Diagnosis not present

## 2021-06-10 DIAGNOSIS — Z860100 Personal history of colon polyps, unspecified: Secondary | ICD-10-CM | POA: Insufficient documentation

## 2021-06-10 DIAGNOSIS — Z7901 Long term (current) use of anticoagulants: Secondary | ICD-10-CM

## 2021-06-10 DIAGNOSIS — K921 Melena: Secondary | ICD-10-CM | POA: Diagnosis not present

## 2021-06-10 NOTE — Progress Notes (Signed)
Chief Complaint:   OBESITY Devon Mathews is here to discuss his progress with his obesity treatment plan along with follow-up of his obesity related diagnoses. Devon Mathews is on the Category 3 Plan and states he is following his eating plan approximately 99% of the time. Devon Mathews states he is not currently exercising.  Today's visit was #: 4 Starting weight: 284 lbs Starting date: 04/28/2021 Today's weight: 266 lbs Today's date: 06/10/2021 Total lbs lost to date: 18 Total lbs lost since last in-office visit: 6  Interim History: Devon Glazier. is here for a follow up office visit.  We reviewed his meal plan and questions were answered.  Patient's food recall appears to be accurate and consistent with what is on plan when he is following it.   When eating on plan, his hunger and cravings are well controlled.   Subjective:   1. New onset Black tarry stools and change in consistency of stools The first part of last week, Devon Mathews started with "black" tarry stools, "sticky and gooey." Consistency has changed as well- He reports on 3 different occasions bowel movements are watery at times and other times it appears normal. His last colonoscopy was 5 years ago and he is due for repeat. He reports no other GI symptoms.  2. Personal history of colonic polyps Devon Mathews had colonic polyps 5 years ago.  3. Chronic anticoagulation For many years now. No change in dose or schedule. Devon Mathews has no history of black stools in the past. He denies history of GI ulcers.  Assessment/Plan:   Orders Placed This Encounter  Procedures   Ambulatory referral to Gastroenterology    Medications Discontinued During This Encounter  Medication Reason   amLODipine (NORVASC) 5 MG tablet Error   potassium chloride 20 MEQ TBCR Error   torsemide (DEMADEX) 20 MG tablet Error     No orders of the defined types were placed in this encounter.    1. new onset Black tarry stools and change in consistency of stools Referral to [facility] (saw Devon Mathews,  Devon Mathews in the past, but wishes to change GI doc).  - Ambulatory referral to Gastroenterology  2. Personal history of colonic polyps Refer to [facility].  3. Chronic anticoagulation Continue anticoagulant, per cardiology for CV issues. Continue to monitor stools.   4. Obesity with current BMI of 39.4  Devon Mathews is currently in the action stage of change. As such, his goal is to continue with weight loss efforts. He has agreed to the Category 3 Plan.   Exercise goals:  As is  Behavioral modification strategies: increasing lean protein intake, decreasing simple carbohydrates, and planning for success.  Devon Mathews has agreed to follow-up with our clinic in 2-3 weeks. He was informed of the importance of frequent follow-up visits to maximize his success with intensive lifestyle modifications for his multiple health conditions.   Objective:   Blood pressure 119/65, pulse (!) 54, temperature 97.9 F (36.6 C), height '5\' 9"'$  (1.753 m), weight 266 lb (120.7 kg), SpO2 97 %. Body mass index is 39.28 kg/m.  General: Cooperative, alert, well developed, in no acute distress. HEENT: Conjunctivae and lids unremarkable. Cardiovascular: Regular rhythm.  Lungs: Normal work of breathing. Neurologic: No focal deficits.   Lab Results  Component Value Date   CREATININE 1.3 04/17/2021   BUN 19 04/17/2021   NA 140 04/17/2021   K 4.5 04/17/2021   CL 100 03/09/2021   CO2 26 (A) 04/17/2021   Lab Results  Component Value  Date   ALT 38 04/17/2021   AST 28 04/17/2021   ALKPHOS 73 04/17/2021   BILITOT 1.1 12/20/2018   Lab Results  Component Value Date   HGBA1C 6.2 04/17/2021   HGBA1C 6.9 (H) 03/09/2021   HGBA1C 5.5 12/21/2018   HGBA1C 5.2 09/14/2018   HGBA1C 5.7 (H) 06/27/2013   Lab Results  Component Value Date   INSULIN 55.5 (H) 04/28/2021   Lab Results  Component Value Date   TSH 5.290 (H) 04/28/2021   Lab Results  Component Value Date   CHOL 139 04/17/2021   HDL 33 (A) 04/17/2021   LDLCALC  85 04/17/2021   TRIG 106 04/17/2021   CHOLHDL 3.0 12/21/2018   Lab Results  Component Value Date   VD25OH 73.3 04/28/2021   Lab Results  Component Value Date   WBC 4.8 04/17/2021   HGB 12.8 (A) 04/17/2021   HCT 37 (A) 04/17/2021   MCV 98 (H) 06/25/2020   PLT 173 04/17/2021   No results found for: IRON, TIBC, FERRITIN  Obesity Behavioral Intervention:   Approximately 15 minutes were spent on the discussion below.  ASK: We discussed the diagnosis of obesity with Devon Mathews today and Devon Mathews agreed to give [place] permission to discuss obesity behavioral modification therapy today.  ASSESS: Devon Mathews has the diagnosis of obesity and his BMI today is 39.4. Devon Mathews is in the action stage of change.   ADVISE: Devon Mathews was educated on the multiple health risks of obesity as well as the benefit of weight loss to improve his health. He was advised of the need for long term treatment and the importance of lifestyle modifications to improve his current health and to decrease his risk of future health problems.  AGREE: Multiple dietary modification options and treatment options were discussed and Devon Mathews agreed to follow the recommendations documented in the above note.  ARRANGE: Devon Mathews was educated on the importance of frequent visits to treat obesity as outlined per CMS and USPSTF guidelines and agreed to schedule his next follow up appointment today.  Attestation Statements:   Reviewed by clinician on day of visit: allergies, medications, problem list, medical history, surgical history, family history, social history, and previous encounter notes.  Coral Ceo, CMA, am acting as transcriptionist for [employer], DO.  I have reviewed the above documentation for accuracy and completeness, and I agree with the above. Marjory Sneddon, D.O.  The [place] was signed into law in 2016 which includes the topic of electronic health records.  This provides immediate access to information in MyChart.   This includes consultation notes, operative notes, office notes, lab results and pathology reports.  If you have any questions about what you read please let us know at your next visit so we can discuss your concerns and take corrective action if need be.  We are right here with you.

## 2021-06-11 DIAGNOSIS — M25561 Pain in right knee: Secondary | ICD-10-CM | POA: Diagnosis not present

## 2021-06-11 DIAGNOSIS — M75121 Complete rotator cuff tear or rupture of right shoulder, not specified as traumatic: Secondary | ICD-10-CM | POA: Diagnosis not present

## 2021-06-11 DIAGNOSIS — M1711 Unilateral primary osteoarthritis, right knee: Secondary | ICD-10-CM | POA: Diagnosis not present

## 2021-06-19 ENCOUNTER — Other Ambulatory Visit: Payer: Self-pay | Admitting: General Practice

## 2021-06-24 ENCOUNTER — Ambulatory Visit (INDEPENDENT_AMBULATORY_CARE_PROVIDER_SITE_OTHER): Payer: Medicare HMO | Admitting: Physician Assistant

## 2021-06-24 ENCOUNTER — Encounter (INDEPENDENT_AMBULATORY_CARE_PROVIDER_SITE_OTHER): Payer: Self-pay | Admitting: Physician Assistant

## 2021-06-24 ENCOUNTER — Other Ambulatory Visit: Payer: Self-pay

## 2021-06-24 VITALS — BP 106/61 | HR 58 | Temp 97.8°F | Ht 69.0 in | Wt 256.0 lb

## 2021-06-24 DIAGNOSIS — E559 Vitamin D deficiency, unspecified: Secondary | ICD-10-CM | POA: Diagnosis not present

## 2021-06-24 DIAGNOSIS — Z6841 Body Mass Index (BMI) 40.0 and over, adult: Secondary | ICD-10-CM | POA: Diagnosis not present

## 2021-06-24 DIAGNOSIS — I10 Essential (primary) hypertension: Secondary | ICD-10-CM

## 2021-06-24 NOTE — Progress Notes (Signed)
Chief Complaint:   OBESITY Devon Mathews is here to discuss his progress with his obesity treatment plan along with follow-up of his obesity related diagnoses. Devon Mathews is on the Category 3 Plan and states he is following his eating plan approximately 99% of the time. Devon Mathews states he is doing 0 minutes 0 times per week.  Today's visit was #: 5 Starting weight: 284 lbs Starting date: 04/28/2021 Today's weight: 256 lbs Today's date: 06/24/2021 Total lbs lost to date: 28 Total lbs lost since last in-office visit: 10  Interim History: Devon Mathews continues to do well with weight loss. He is eating all of the food on his plan and his hunger is satisfied. He sometimes does not eat all of his snack calories.  Subjective:   1. Essential hypertension Devon Mathews is followed by Cardiology, and his blood pressure is well controlled.  2. Vitamin D deficiency Devon Mathews is on Vit D daily, and he is tolerating it well.  Assessment/Plan:   1. Essential hypertension Devon Mathews will continue working on healthy weight loss and exercise to improve blood pressure control. We will will continue to monitor his blood pressure each visit, and will watch for signs of hypotension as he continues his lifestyle modifications.he will follow up with Cardiology and continue his medications.  2. Vitamin D deficiency Low Vitamin D level contributes to fatigue and are associated with obesity, breast, and colon cancer. Devon Mathews will continue Vitamin D 5,000 IU daily and will follow-up for routine testing of Vitamin D, at least 2-3 times per year to avoid over-replacement.  3. Obesity with current BMI of 37.79 Devon Mathews is currently in the action stage of change. As such, his goal is to continue with weight loss efforts. He has agreed to the Category 3 Plan.   Exercise goals: No exercise has been prescribed at this time.  Behavioral modification strategies: meal planning and cooking strategies and planning for success.  Devon Mathews has agreed to follow-up with our clinic in  2 weeks. He was informed of the importance of frequent follow-up visits to maximize his success with intensive lifestyle modifications for his multiple health conditions.   Objective:   Blood pressure 106/61, pulse (!) 58, temperature 97.8 F (36.6 C), height '5\' 9"'$  (1.753 m), weight 256 lb (116.1 kg), SpO2 96 %. Body mass index is 37.8 kg/m.  General: Cooperative, alert, well developed, in no acute distress. HEENT: Conjunctivae and lids unremarkable. Cardiovascular: Regular rhythm.  Lungs: Normal work of breathing. Neurologic: No focal deficits.   Lab Results  Component Value Date   CREATININE 1.3 04/17/2021   BUN 19 04/17/2021   NA 140 04/17/2021   K 4.5 04/17/2021   CL 100 03/09/2021   CO2 26 (A) 04/17/2021   Lab Results  Component Value Date   ALT 38 04/17/2021   AST 28 04/17/2021   ALKPHOS 73 04/17/2021   BILITOT 1.1 12/20/2018   Lab Results  Component Value Date   HGBA1C 6.2 04/17/2021   HGBA1C 6.9 (H) 03/09/2021   HGBA1C 5.5 12/21/2018   HGBA1C 5.2 09/14/2018   HGBA1C 5.7 (H) 06/27/2013   Lab Results  Component Value Date   INSULIN 55.5 (H) 04/28/2021   Lab Results  Component Value Date   TSH 5.290 (H) 04/28/2021   Lab Results  Component Value Date   CHOL 139 04/17/2021   HDL 33 (A) 04/17/2021   LDLCALC 85 04/17/2021   TRIG 106 04/17/2021   CHOLHDL 3.0 12/21/2018   Lab Results  Component Value Date  VD25OH 73.3 04/28/2021   Lab Results  Component Value Date   WBC 4.8 04/17/2021   HGB 12.8 (A) 04/17/2021   HCT 37 (A) 04/17/2021   MCV 98 (H) 06/25/2020   PLT 173 04/17/2021   No results found for: IRON, TIBC, FERRITIN  Obesity Behavioral Intervention:   Approximately 15 minutes were spent on the discussion below.  ASK: We discussed the diagnosis of obesity with Devon Mathews today and Devon Mathews agreed to give [place] permission to discuss obesity behavioral modification therapy today.  ASSESS: Devon Mathews has the diagnosis of obesity and his BMI today is 37.8. Devon Mathews  is in the action stage of change.   ADVISE: Devon Mathews was educated on the multiple health risks of obesity as well as the benefit of weight loss to improve his health. He was advised of the need for long term treatment and the importance of lifestyle modifications to improve his current health and to decrease his risk of future health problems.  AGREE: Multiple dietary modification options and treatment options were discussed and Devon Mathews agreed to follow the recommendations documented in the above note.  ARRANGE: Devon Mathews was educated on the importance of frequent visits to treat obesity as outlined per CMS and USPSTF guidelines and agreed to schedule his next follow up appointment today.  Attestation Statements:   Reviewed by clinician on day of visit: allergies, medications, problem list, medical history, surgical history, family history, social history, and previous encounter notes.   Wilhemena Durie, am acting as transcriptionist for [employer], PA-C.  I have reviewed the above documentation for accuracy and completeness, and I agree with the above. Abby Potash, PA-C

## 2021-07-06 ENCOUNTER — Other Ambulatory Visit: Payer: Self-pay | Admitting: Cardiovascular Disease

## 2021-07-06 NOTE — Telephone Encounter (Signed)
Rx has been sent to the pharmacy electronically. ° °

## 2021-07-08 ENCOUNTER — Encounter (INDEPENDENT_AMBULATORY_CARE_PROVIDER_SITE_OTHER): Payer: Self-pay

## 2021-07-08 ENCOUNTER — Other Ambulatory Visit: Payer: Self-pay

## 2021-07-08 ENCOUNTER — Encounter (INDEPENDENT_AMBULATORY_CARE_PROVIDER_SITE_OTHER): Payer: Self-pay | Admitting: Family Medicine

## 2021-07-08 ENCOUNTER — Ambulatory Visit (INDEPENDENT_AMBULATORY_CARE_PROVIDER_SITE_OTHER): Payer: Medicare HMO | Admitting: Family Medicine

## 2021-07-08 VITALS — BP 137/73 | HR 58 | Temp 98.0°F | Ht 69.0 in | Wt 258.0 lb

## 2021-07-08 DIAGNOSIS — K921 Melena: Secondary | ICD-10-CM | POA: Diagnosis not present

## 2021-07-08 DIAGNOSIS — Z6841 Body Mass Index (BMI) 40.0 and over, adult: Secondary | ICD-10-CM | POA: Diagnosis not present

## 2021-07-08 DIAGNOSIS — I10 Essential (primary) hypertension: Secondary | ICD-10-CM

## 2021-07-08 NOTE — Progress Notes (Signed)
Chief Complaint:   OBESITY Devon Mathews is here to discuss his progress with his obesity treatment plan along with follow-up of his obesity related diagnoses. Devon Mathews is on the Category 3 Plan and states he is following his eating plan approximately 95% of the time. Devon Mathews states he is not currently exercising.  Today's visit was #: 6 Starting weight: 284 lbs Starting date: 04/28/2021 Today's weight: 258 lbs Today's date: 07/08/2021 Total lbs lost to date: 26 Total lbs lost since last in-office visit: 0  Interim History: This past weekend was pt's grandson's birthday and he had 3 hamburgers because they tasted so good. He also had dessert. Pt got back to eating on plan the following Monday.  Subjective:   1. Essential hypertension At goal. Medication: Norvasc, Cozaar, Toprol XL.  BP Readings from Last 3 Encounters:  07/08/21 137/73  06/24/21 106/61  06/10/21 119/65   Lab Results  Component Value Date   CREATININE 1.3 04/17/2021   CREATININE 1.30 (H) 03/09/2021   CREATININE 1.09 02/13/2021   2. Black tarry stools Pt was referred to [facility] at the end of August and has yet receive a call to schedule an appt.  Assessment/Plan:  No orders of the defined types were placed in this encounter.   There are no discontinued medications.   No orders of the defined types were placed in this encounter.    1. Essential hypertension Devon Mathews is working on healthy weight loss and exercise to improve blood pressure control. We will watch for signs of hypotension as he continues his lifestyle modifications. Continue current treatment plan.  2. Black tarry stools Nurse will call GI office and inquire about referral.  3. Obesity with current BMI of 38.1  Devon Mathews is currently in the action stage of change. As such, his goal is to continue with weight loss efforts. He has agreed to the Category 3 Plan.   Exercise goals:  As is- Start walking 10 minutes a day.  Behavioral modification strategies:  increasing lean protein intake, decreasing simple carbohydrates, and planning for success.  Devon Mathews has agreed to follow-up with our clinic in 2 weeks. He was informed of the importance of frequent follow-up visits to maximize his success with intensive lifestyle modifications for his multiple health conditions.   Objective:   Blood pressure 137/73, pulse (!) 58, temperature 98 F (36.7 C), height 5\' 9"  (1.753 m), weight 258 lb (117 kg), SpO2 94 %. Body mass index is 38.1 kg/m.  General: Cooperative, alert, well developed, in no acute distress. HEENT: Conjunctivae and lids unremarkable. Cardiovascular: Regular rhythm.  Lungs: Normal work of breathing. Neurologic: No focal deficits.   Lab Results  Component Value Date   CREATININE 1.3 04/17/2021   BUN 19 04/17/2021   NA 140 04/17/2021   K 4.5 04/17/2021   CL 100 03/09/2021   CO2 26 (A) 04/17/2021   Lab Results  Component Value Date   ALT 38 04/17/2021   AST 28 04/17/2021   ALKPHOS 73 04/17/2021   BILITOT 1.1 12/20/2018   Lab Results  Component Value Date   HGBA1C 6.2 04/17/2021   HGBA1C 6.9 (H) 03/09/2021   HGBA1C 5.5 12/21/2018   HGBA1C 5.2 09/14/2018   HGBA1C 5.7 (H) 06/27/2013   Lab Results  Component Value Date   INSULIN 55.5 (H) 04/28/2021   Lab Results  Component Value Date   TSH 5.290 (H) 04/28/2021   Lab Results  Component Value Date   CHOL 139 04/17/2021   HDL 33 (  A) 04/17/2021   LDLCALC 85 04/17/2021   TRIG 106 04/17/2021   CHOLHDL 3.0 12/21/2018   Lab Results  Component Value Date   VD25OH 73.3 04/28/2021   Lab Results  Component Value Date   WBC 4.8 04/17/2021   HGB 12.8 (A) 04/17/2021   HCT 37 (A) 04/17/2021   MCV 98 (H) 06/25/2020   PLT 173 04/17/2021   No results found for: IRON, TIBC, FERRITIN  Obesity Behavioral Intervention:   Approximately 15 minutes were spent on the discussion below.  ASK: We discussed the diagnosis of obesity with Devon Mathews today and Devon Mathews agreed to give [place]  permission to discuss obesity behavioral modification therapy today.  ASSESS: Devon Mathews has the diagnosis of obesity and his BMI today is 38.1. Devon Mathews is in the action stage of change.   ADVISE: Devon Mathews was educated on the multiple health risks of obesity as well as the benefit of weight loss to improve his health. He was advised of the need for long term treatment and the importance of lifestyle modifications to improve his current health and to decrease his risk of future health problems.  AGREE: Multiple dietary modification options and treatment options were discussed and Devon Mathews agreed to follow the recommendations documented in the above note.  ARRANGE: [name] was educated on the importance of frequent visits to treat obesity as outlined per CMS and USPSTF guidelines and agreed to schedule his next follow up appointment today.  Attestation Statements:   Reviewed by clinician on day of visit: allergies, medications, problem list, medical history, surgical history, family history, social history, and previous encounter notes.  Coral Ceo, CMA, am acting as transcriptionist for [employer], DO.  I have reviewed the above documentation for accuracy and completeness, and I agree with the above. Devon Mathews, D.O.  The [place] was signed into law in 2016 which includes the topic of electronic health records.  This provides immediate access to information in MyChart.  This includes consultation notes, operative notes, office notes, lab results and pathology reports.  If you have any questions about what you read please let us know at your next visit so we can discuss your concerns and take corrective action if need be.  We are right here with you.

## 2021-07-14 ENCOUNTER — Encounter: Payer: Self-pay | Admitting: Internal Medicine

## 2021-07-20 ENCOUNTER — Other Ambulatory Visit (INDEPENDENT_AMBULATORY_CARE_PROVIDER_SITE_OTHER): Payer: Self-pay | Admitting: Family Medicine

## 2021-07-20 DIAGNOSIS — G2581 Restless legs syndrome: Secondary | ICD-10-CM

## 2021-07-22 ENCOUNTER — Other Ambulatory Visit: Payer: Self-pay

## 2021-07-22 ENCOUNTER — Ambulatory Visit (INDEPENDENT_AMBULATORY_CARE_PROVIDER_SITE_OTHER): Payer: Medicare HMO | Admitting: Physician Assistant

## 2021-07-22 ENCOUNTER — Encounter (INDEPENDENT_AMBULATORY_CARE_PROVIDER_SITE_OTHER): Payer: Self-pay | Admitting: Physician Assistant

## 2021-07-22 VITALS — BP 113/58 | HR 53 | Temp 97.8°F | Ht 69.0 in | Wt 258.0 lb

## 2021-07-22 DIAGNOSIS — Z6841 Body Mass Index (BMI) 40.0 and over, adult: Secondary | ICD-10-CM

## 2021-07-22 DIAGNOSIS — I10 Essential (primary) hypertension: Secondary | ICD-10-CM

## 2021-07-22 DIAGNOSIS — E559 Vitamin D deficiency, unspecified: Secondary | ICD-10-CM

## 2021-07-22 NOTE — Progress Notes (Signed)
Chief Complaint:   OBESITY Devon Mathews is here to discuss his progress with his obesity treatment plan along with follow-up of his obesity related diagnoses. Devon Mathews is on the Category 3 Plan and states he is following his eating plan approximately 85% of the time. Devon Mathews states he is walking for 10 minutes 3 times per week.  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 He is not eating enough protein on his salads and not weighing his protein at dinner.  Subjective:   1. Essential hypertension Devon Mathews's blood pressure is well controlled, and he is managed by his primary care physician.  2. Vitamin D deficiency Devon Mathews is on Vit D OTC, and his last level was at goal.  Assessment/Plan:   1. Essential hypertension Devon Mathews will continue his medications and meal plan, and he will follow up with his primary care physician. He will continue working on healthy weight loss and exercise to improve blood pressure control. We will watch for signs of hypotension as he continues his lifestyle modifications.  2. Vitamin D deficiency Low Vitamin D level contributes to fatigue and are associated with obesity, breast, and colon cancer. Devon Mathews will continue Vitamin D 5,000 IU daily and will follow-up for routine testing of Vitamin D, at least 2-3 times per year to avoid over-replacement.  3. Obesity with current BMI of 38.08 Devon Mathews is currently in the action stage of change. As such, his goal is to continue with weight loss efforts. He has agreed to the Category 3 Plan.   Exercise goals: As is.  Behavioral modification strategies: meal planning and cooking strategies and keeping healthy foods in  the home.  Devon Mathews has agreed to follow-up with our clinic in 3 weeks. He was informed of the importance of frequent follow-up visits to maximize his success with intensive lifestyle modifications for his multiple health conditions.   Objective:   Blood pressure (!) 113/58, pulse (!) 53, temperature 97.8 F (36.6 C), height 5\' 9"  (1.753 m), weight 258 lb (117 kg), SpO2 97 %. Body mass index is 38.1 kg/m.  General: Cooperative, alert, well developed, in no acute distress. HEENT: Conjunctivae and lids unremarkable. Cardiovascular: Regular rhythm.  Lungs: Normal work of breathing. Neurologic: No focal deficits.   Lab Results  Component Value Date   CREATININE 1.3 04/17/2021   BUN 19 04/17/2021   NA 140 04/17/2021   K 4.5 04/17/2021   CL 100 03/09/2021   CO2 26 (A) 04/17/2021   Lab Results  Component Value Date   ALT 38 04/17/2021   AST 28 04/17/2021   ALKPHOS 73 04/17/2021   BILITOT 1.1 12/20/2018   Lab Results  Component Value Date   HGBA1C 6.2 04/17/2021   HGBA1C 6.9 (H) 03/09/2021   HGBA1C 5.5 12/21/2018   HGBA1C 5.2 09/14/2018   HGBA1C 5.7 (H) 06/27/2013   Lab Results  Component Value Date   INSULIN 55.5 (H) 04/28/2021   Lab Results  Component Value Date   TSH 5.290 (H) 04/28/2021   Lab Results  Component Value Date   CHOL 139 04/17/2021   HDL 33 (A) 04/17/2021   LDLCALC 85 04/17/2021   TRIG 106 04/17/2021  CHOLHDL 3.0 12/21/2018   Lab Results  Component Value Date   VD25OH 73.3 04/28/2021   Lab Results  Component Value Date   WBC 4.8 04/17/2021   HGB 12.8 (A) 04/17/2021   HCT 37 (A) 04/17/2021   MCV 98 (H) 06/25/2020   PLT 173 04/17/2021   No results found for: IRON, TIBC, FERRITIN  Obesity Behavioral Intervention:   Approximately 15 minutes were spent on the discussion below.  ASK: We discussed the diagnosis of obesity with Devon Mathews today and Devon Mathews agreed to give [place] permission to discuss obesity behavioral modification therapy  today.  ASSESS: Devon Mathews has the diagnosis of obesity and his BMI today is 38.2. Devon Mathews is in the action stage of change.   ADVISE: Devon Mathews was educated on the multiple health risks of obesity as well as the benefit of weight loss to improve his health. He was advised of the need for long term treatment and the importance of lifestyle modifications to improve his current health and to decrease his risk of future health problems.  AGREE: Multiple dietary modification options and treatment options were discussed and Devon Mathews agreed to follow the recommendations documented in the above note.  ARRANGE: Devon Mathews was educated on the importance of frequent visits to treat obesity as outlined per CMS and USPSTF guidelines and agreed to schedule his next follow up appointment today.  Attestation Statements:   Reviewed by clinician on day of visit: allergies, medications, problem list, medical history, surgical history, family history, social history, and previous encounter notes.   Wilhemena Durie, am acting as transcriptionist for [employer], PA-C.  I have reviewed the above documentation for accuracy and completeness, and I agree with the above. Abby Potash, PA-C

## 2021-07-23 ENCOUNTER — Encounter (INDEPENDENT_AMBULATORY_CARE_PROVIDER_SITE_OTHER): Payer: Self-pay

## 2021-07-23 DIAGNOSIS — M25561 Pain in right knee: Secondary | ICD-10-CM | POA: Diagnosis not present

## 2021-07-23 DIAGNOSIS — M75121 Complete rotator cuff tear or rupture of right shoulder, not specified as traumatic: Secondary | ICD-10-CM | POA: Diagnosis not present

## 2021-08-10 ENCOUNTER — Encounter (INDEPENDENT_AMBULATORY_CARE_PROVIDER_SITE_OTHER): Payer: Self-pay | Admitting: Adult Health

## 2021-08-10 ENCOUNTER — Other Ambulatory Visit: Payer: Self-pay

## 2021-08-10 ENCOUNTER — Ambulatory Visit (INDEPENDENT_AMBULATORY_CARE_PROVIDER_SITE_OTHER): Payer: Medicare HMO | Admitting: Adult Health

## 2021-08-10 VITALS — BP 117/62 | HR 60 | Temp 98.4°F | Ht 69.0 in | Wt 256.0 lb

## 2021-08-10 DIAGNOSIS — I10 Essential (primary) hypertension: Secondary | ICD-10-CM

## 2021-08-10 DIAGNOSIS — G2581 Restless legs syndrome: Secondary | ICD-10-CM | POA: Diagnosis not present

## 2021-08-10 DIAGNOSIS — Z6841 Body Mass Index (BMI) 40.0 and over, adult: Secondary | ICD-10-CM | POA: Diagnosis not present

## 2021-08-10 NOTE — Progress Notes (Signed)
Chief Complaint:   OBESITY Devon Mathews is here to discuss his progress with his obesity treatment plan along with follow-up of his obesity related diagnoses. Devon Mathews is on the Category 3 Plan and states he is following his eating plan approximately 75% of the time. Devon Mathews states he is walking for 10 minutes 6 times per week.  Today's visit was #: 8 Starting weight: 284 lbs Starting date: 04/28/2021 Today's weight: 256 lbs Today's date: 08/10/2021 Total lbs lost to date: 28 lbs Total lbs lost since last in-office visit: 2 lbs  Interim History:  Devon Mathews states, "I feel great".  He is down 28 pounds!  He has eliminated soda. He tries to avoid any caffeine after 2000.  This has helped reduce RLS sx's.  Of note:  Recently celebrated 47th wedding anniversary.  Subjective:   1. Essential hypertension BP/HR excellent at OV. He is on Norvasc 5mg , Cozaar 50mg  QD, Toprol XL 50mg  QD He denies dizziness.  2. Restless leg syndrome He very seldom uses amitriptyline -he has not used amitriptyline this month. he prefers to use hot shower/lotion to treat symptoms.  Assessment/Plan:   1. Essential hypertension Continue current antihypertensive therapy.  2. Restless leg syndrome Continue amitriptyline and using holistic measures (shower/lotion) to decrease RLS symptoms.  3. Obesity with current BMI of 37.8  Devon Mathews is currently in the action stage of change. As such, his goal is to continue with weight loss efforts. He has agreed to the Category 3 Plan and keeping a food journal and adhering to recommended goals of 350-500 calories and 35+ grams of protein.   Exercise goals:  As is.  Increase to 7 days of walking.  Behavioral modification strategies: increasing lean protein intake, decreasing simple carbohydrates, meal planning and cooking strategies, keeping healthy foods in the home, and planning for success.  Devon Mathews has agreed to follow-up with our clinic in 3 weeks. He was informed of the importance of  frequent follow-up visits to maximize his success with intensive lifestyle modifications for his multiple health conditions.   Objective:   Blood pressure 117/62, pulse 60, temperature 98.4 F (36.9 C), height 5\' 9"  (1.753 m), weight 256 lb (116.1 kg), SpO2 96 %. Body mass index is 37.8 kg/m.  General: Cooperative, alert, well developed, in no acute distress. HEENT: Conjunctivae and lids unremarkable. Cardiovascular: Regular rhythm.  Lungs: Normal work of breathing. Neurologic: No focal deficits.   Lab Results  Component Value Date   CREATININE 1.3 04/17/2021   BUN 19 04/17/2021   NA 140 04/17/2021   K 4.5 04/17/2021   CL 100 03/09/2021   CO2 26 (A) 04/17/2021   Lab Results  Component Value Date   ALT 38 04/17/2021   AST 28 04/17/2021   ALKPHOS 73 04/17/2021   BILITOT 1.1 12/20/2018   Lab Results  Component Value Date   HGBA1C 6.2 04/17/2021   HGBA1C 6.9 (H) 03/09/2021   HGBA1C 5.5 12/21/2018   HGBA1C 5.2 09/14/2018   HGBA1C 5.7 (H) 06/27/2013   Lab Results  Component Value Date   INSULIN 55.5 (H) 04/28/2021   Lab Results  Component Value Date   TSH 5.290 (H) 04/28/2021   Lab Results  Component Value Date   CHOL 139 04/17/2021   HDL 33 (A) 04/17/2021   LDLCALC 85 04/17/2021   TRIG 106 04/17/2021   CHOLHDL 3.0 12/21/2018   Lab Results  Component Value Date   VD25OH 73.3 04/28/2021   Lab Results  Component Value Date   WBC  4.8 04/17/2021   HGB 12.8 (A) 04/17/2021   HCT 37 (A) 04/17/2021   MCV 98 (H) 06/25/2020   PLT 173 04/17/2021   Attestation Statements:   Reviewed by clinician on day of visit: allergies, medications, problem list, medical history, surgical history, family history, social history, and previous encounter notes.  Time spent on visit including pre-visit chart review and post-visit care and charting was 30 minutes.   I, Water quality scientist, CMA, am acting as Location manager for Mina Marble, NP.  I have reviewed the above documentation  for accuracy and completeness, and I agree with the above. -  Roshan Roback d. Crucita Lacorte, NP-C

## 2021-08-19 ENCOUNTER — Ambulatory Visit: Payer: Medicare HMO | Admitting: Physician Assistant

## 2021-08-20 ENCOUNTER — Other Ambulatory Visit: Payer: Self-pay | Admitting: Cardiovascular Disease

## 2021-08-20 NOTE — Telephone Encounter (Signed)
Rx(s) sent to pharmacy electronically.  

## 2021-08-24 ENCOUNTER — Ambulatory Visit: Payer: Medicare HMO | Admitting: Physician Assistant

## 2021-08-24 ENCOUNTER — Encounter: Payer: Self-pay | Admitting: Physician Assistant

## 2021-08-24 ENCOUNTER — Other Ambulatory Visit: Payer: Self-pay

## 2021-08-24 DIAGNOSIS — D0439 Carcinoma in situ of skin of other parts of face: Secondary | ICD-10-CM

## 2021-08-24 DIAGNOSIS — C4492 Squamous cell carcinoma of skin, unspecified: Secondary | ICD-10-CM

## 2021-08-24 DIAGNOSIS — Z85828 Personal history of other malignant neoplasm of skin: Secondary | ICD-10-CM | POA: Diagnosis not present

## 2021-08-24 DIAGNOSIS — D485 Neoplasm of uncertain behavior of skin: Secondary | ICD-10-CM

## 2021-08-24 HISTORY — DX: Squamous cell carcinoma of skin, unspecified: C44.92

## 2021-08-24 NOTE — Progress Notes (Addendum)
   Follow-Up Visit   Subjective  Devon Mathews. is a 70 y.o. male who presents for the following: Follow-up (Patient here today for 12 week follow up. Per patient he has a new lesion on his left forehead that came up since his last appointment, no bleeding, per patient painful to touch. Personal history of non mole skin cancer. ).   The following portions of the chart were reviewed this encounter and updated as appropriate:  Tobacco  Allergies  Meds  Problems  Med Hx  Surg Hx  Fam Hx      Objective  Well appearing patient in no apparent distress; mood and affect are within normal limits.  A focused examination was performed including face. Relevant physical exam findings are noted in the Assessment and Plan.  Left Forehead Hyperkeratotic scale with pink base        Assessment & Plan  Neoplasm of uncertain behavior of skin Left Forehead  Skin / nail biopsy Type of biopsy: tangential   Informed consent: discussed and consent obtained   Timeout: patient name, date of birth, surgical site, and procedure verified   Anesthesia: the lesion was anesthetized in a standard fashion   Anesthetic:  1% lidocaine w/ epinephrine 1-100,000 local infiltration Instrument used: flexible razor blade   Hemostasis achieved with: ferric subsulfate   Outcome: patient tolerated procedure well   Post-procedure details: wound care instructions given    Specimen 1 - Surgical pathology Differential Diagnosis: scc vs bcc  If + MOHS  Check Margins: No   I, Ilya Ess, PA-C, have reviewed all documentation's for this visit.  The documentation on 09/22/21 for the exam, diagnosis, procedures and orders are all accurate and complete.

## 2021-08-27 ENCOUNTER — Encounter: Payer: Self-pay | Admitting: *Deleted

## 2021-08-27 ENCOUNTER — Telehealth: Payer: Self-pay | Admitting: *Deleted

## 2021-08-27 NOTE — Telephone Encounter (Signed)
-----   Message from Warren Danes, [place] sent at 08/26/2021  4:25 PM EST ----- [email or web address]

## 2021-08-27 NOTE — Telephone Encounter (Signed)
Left message for patient to return our phone call for results.

## 2021-08-27 NOTE — Telephone Encounter (Signed)
Referral sent to [facility] for [facility]

## 2021-08-27 NOTE — Telephone Encounter (Signed)
Patient left message on office voice mail that he was returning call about results.

## 2021-08-27 NOTE — Telephone Encounter (Addendum)
Phone call to patient with his pathology results. Patient aware of results. MOH's referral sent.

## 2021-08-31 ENCOUNTER — Encounter (INDEPENDENT_AMBULATORY_CARE_PROVIDER_SITE_OTHER): Payer: Self-pay | Admitting: Family Medicine

## 2021-08-31 ENCOUNTER — Ambulatory Visit (INDEPENDENT_AMBULATORY_CARE_PROVIDER_SITE_OTHER): Payer: Medicare HMO | Admitting: Physician Assistant

## 2021-08-31 ENCOUNTER — Ambulatory Visit (INDEPENDENT_AMBULATORY_CARE_PROVIDER_SITE_OTHER): Payer: Medicare HMO | Admitting: Family Medicine

## 2021-08-31 ENCOUNTER — Other Ambulatory Visit: Payer: Self-pay

## 2021-08-31 VITALS — BP 118/64 | HR 61 | Temp 97.9°F | Ht 69.0 in | Wt 254.0 lb

## 2021-08-31 DIAGNOSIS — E559 Vitamin D deficiency, unspecified: Secondary | ICD-10-CM | POA: Diagnosis not present

## 2021-08-31 DIAGNOSIS — Z6841 Body Mass Index (BMI) 40.0 and over, adult: Secondary | ICD-10-CM

## 2021-08-31 DIAGNOSIS — R7303 Prediabetes: Secondary | ICD-10-CM | POA: Diagnosis not present

## 2021-08-31 DIAGNOSIS — I10 Essential (primary) hypertension: Secondary | ICD-10-CM

## 2021-08-31 DIAGNOSIS — R7989 Other specified abnormal findings of blood chemistry: Secondary | ICD-10-CM

## 2021-08-31 DIAGNOSIS — G2581 Restless legs syndrome: Secondary | ICD-10-CM

## 2021-08-31 NOTE — Progress Notes (Signed)
Chief Complaint:   OBESITY Devon Mathews is here to discuss his progress with his obesity treatment plan along with follow-up of his obesity related diagnoses. Devon Mathews is on the Category 3 Plan and keeping a food journal and adhering to recommended goals of 350-500 calories and 35 grams protein and states he is following his eating plan approximately 95% of the time. Devon Mathews states he is walking 10-40 minutes 3 times per week.  Today's visit was #: 9 Starting weight: 284 lbs Starting date: 04/28/2021 Today's weight: 254 lbs Today's date: 08/31/2021 Total lbs lost to date: 30 Total lbs lost since last in-office visit: 2  Interim History: Devon Glazier. is here for a follow up office visit.  We reviewed his meal plan and questions were answered.  Patient's food recall appears to be accurate and consistent with what is on plan when he is following it.   When eating on plan, his hunger and cravings are well controlled.    Subjective:   1. Pre-diabetes Devon Mathews has a diagnosis of prediabetes based on his elevated HgA1c and was informed this puts him at greater risk of developing diabetes. He continues to work on diet and exercise to decrease his risk of diabetes. He denies nausea or hypoglycemia. He denies hunger or carb cravings.  2. Essential hypertension Devon Mathews reports occasional dizziness when his feet first hit the floor in the morning. He feels "unsteady" and it lasts a second or two. He has a history of vertigo and has been evaluated by cardiology- vertigo is chronic in nature.  3. Restless leg syndrome Devon Mathews takes Elavil QHS prn. He takes it less if he drinks enough eater and avoids caffeine.  4. Vitamin D deficiency He is currently taking OTC vitamin D 5,000 IU each day. He denies nausea, vomiting or muscle weakness.  5. Elevated TSH Devon Mathews has a history of elevated TSH.   Assessment/Plan:   Orders Placed This Encounter  Procedures   Hemoglobin A1c   Insulin, random   Comprehensive metabolic panel    CBC with Differential/Platelet   Magnesium   VITAMIN D 25 Hydroxy (Vit-D Deficiency, Fractures)   TSH   T4, free    There are no discontinued medications.   No orders of the defined types were placed in this encounter.    1. Pre-diabetes Devon Mathews will continue to work on weight loss, exercise, and decreasing simple carbohydrates to help decrease the risk of diabetes. Check labs at next OV.  - Hemoglobin A1c - Insulin, random  2. Essential hypertension Pt will check BP first thing in the morning before getting out of bed, then one other time per day. Devon Mathews is working on healthy weight loss and exercise to improve blood pressure control. We will watch for signs of hypotension as he continues his lifestyle modifications. Continue meds for now.  3. Restless leg syndrome Continue current treatment plan. Check labs at next OV.  - Comprehensive metabolic panel - CBC with Differential/Platelet - Magnesium  4. Vitamin D deficiency Low Vitamin D level contributes to fatigue and are associated with obesity, breast, and colon cancer. He agrees to continue to take OTC Vitamin D 5,000 IU daily and will follow-up for routine testing of Vitamin D, at least 2-3 times per year to avoid over-replacement. Check labs at next OV.  - VITAMIN D 25 Hydroxy (Vit-D Deficiency, Fractures)  5. Elevated TSH Patient with long-standing hypothyroidism, on levothyroxine therapy. He appears euthyroid. Orders and follow up as documented in patient record.  Counseling Good thyroid control is important for overall health. Devon Mathews thyroid levels are dangerous and will not improve weight loss results. The correct way to take levothyroxine is fasting, with water, separated by at least 30 minutes from breakfast, and separated by more than 4 hours from calcium, iron, multivitamins, acid reflux medications (PPIs).  Check labs at next OV.  - TSH - T4, free  6. Obesity with current BMI of 37.5  Devon Mathews is currently  in the action stage of change. As such, his goal is to continue with weight loss efforts. He has agreed to the Category 3 Plan and keeping a food journal and adhering to recommended goals of 350-500 calories and 35 grams protein.   Obtain labs at next OV.  Exercise goals:  Increase walking to 20 minutes 5 days a week.  Behavioral modification strategies: increasing lean protein intake, decreasing simple carbohydrates, and planning for success.  Devon Mathews has agreed to follow-up with our clinic in 2 weeks (come fasting for labs). He was informed of the importance of frequent follow-up visits to maximize his success with intensive lifestyle modifications for his multiple health conditions.   Objective:   Blood pressure 118/64, pulse 61, temperature 97.9 F (36.6 C), height 5\' 9"  (1.753 m), weight 254 lb (115.2 kg), SpO2 96 %. Body mass index is 37.51 kg/m.  General: Cooperative, alert, well developed, in no acute distress. HEENT: Conjunctivae and lids unremarkable. Cardiovascular: Regular rhythm.  Lungs: Normal work of breathing. Neurologic: No focal deficits.   Lab Results  Component Value Date   CREATININE 1.3 04/17/2021   BUN 19 04/17/2021   NA 140 04/17/2021   K 4.5 04/17/2021   CL 100 03/09/2021   CO2 26 (A) 04/17/2021   Lab Results  Component Value Date   ALT 38 04/17/2021   AST 28 04/17/2021   ALKPHOS 73 04/17/2021   BILITOT 1.1 12/20/2018   Lab Results  Component Value Date   HGBA1C 6.2 04/17/2021   HGBA1C 6.9 (H) 03/09/2021   HGBA1C 5.5 12/21/2018   HGBA1C 5.2 09/14/2018   HGBA1C 5.7 (H) 06/27/2013   Lab Results  Component Value Date   INSULIN 55.5 (H) 04/28/2021   Lab Results  Component Value Date   TSH 5.290 (H) 04/28/2021   Lab Results  Component Value Date   CHOL 139 04/17/2021   HDL 33 (A) 04/17/2021   LDLCALC 85 04/17/2021   TRIG 106 04/17/2021   CHOLHDL 3.0 12/21/2018   Lab Results  Component Value Date   VD25OH 73.3 04/28/2021   Lab Results   Component Value Date   WBC 4.8 04/17/2021   HGB 12.8 (A) 04/17/2021   HCT 37 (A) 04/17/2021   MCV 98 (H) 06/25/2020   PLT 173 04/17/2021   No results found for: IRON, TIBC, FERRITIN  Obesity Behavioral Intervention:   Approximately 15 minutes were spent on the discussion below.  ASK: We discussed the diagnosis of obesity with Devon Mathews today and Devon Mathews agreed to give [place] permission to discuss obesity behavioral modification therapy today.  ASSESS: Devon Mathews has the diagnosis of obesity and his BMI today is 37.5. Devon Mathews is in the action stage of change.   ADVISE: Devon Mathews was educated on the multiple health risks of obesity as well as the benefit of weight loss to improve his health. He was advised of the need for long term treatment and the importance of lifestyle modifications to improve his current health and to decrease his risk of future health problems.  AGREE: Multiple  dietary modification options and treatment options were discussed and Devon Mathews agreed to follow the recommendations documented in the above note.  ARRANGE: Devon Mathews was educated on the importance of frequent visits to treat obesity as outlined per CMS and USPSTF guidelines and agreed to schedule his next follow up appointment today.  Attestation Statements:   Reviewed by clinician on day of visit: allergies, medications, problem list, medical history, surgical history, family history, social history, and previous encounter notes.  Coral Ceo, CMA, am acting as transcriptionist for [employer], DO.  I have reviewed the above documentation for accuracy and completeness, and I agree with the above. Marjory Sneddon, D.O.  The [place] was signed into law in 2016 which includes the topic of electronic health records.  This provides immediate access to information in MyChart.  This includes consultation notes, operative notes, office notes, lab results and pathology reports.  If you have any questions about what you  read please let us know at your next visit so we can discuss your concerns and take corrective action if need be.  We are right here with you.

## 2021-09-16 ENCOUNTER — Telehealth: Payer: Self-pay | Admitting: Physician Assistant

## 2021-09-16 NOTE — Telephone Encounter (Signed)
Patient left message on office voice mail saying that he has not heard anything from [facility] about an appointment and wants to know what he should do.

## 2021-09-16 NOTE — Telephone Encounter (Signed)
Phone call to patient to let him know his referral for [facility]HS is in review with the [facility].

## 2021-09-18 ENCOUNTER — Telehealth: Payer: Self-pay

## 2021-09-18 ENCOUNTER — Encounter: Payer: Self-pay | Admitting: Internal Medicine

## 2021-09-18 ENCOUNTER — Ambulatory Visit: Payer: Medicare HMO | Admitting: Internal Medicine

## 2021-09-18 VITALS — BP 120/52 | HR 64 | Ht 69.0 in | Wt 263.6 lb

## 2021-09-18 DIAGNOSIS — I480 Paroxysmal atrial fibrillation: Secondary | ICD-10-CM

## 2021-09-18 DIAGNOSIS — M6208 Separation of muscle (nontraumatic), other site: Secondary | ICD-10-CM | POA: Diagnosis not present

## 2021-09-18 DIAGNOSIS — R1319 Other dysphagia: Secondary | ICD-10-CM

## 2021-09-18 DIAGNOSIS — K921 Melena: Secondary | ICD-10-CM | POA: Diagnosis not present

## 2021-09-18 DIAGNOSIS — Z86010 Personal history of colonic polyps: Secondary | ICD-10-CM | POA: Diagnosis not present

## 2021-09-18 DIAGNOSIS — Z7901 Long term (current) use of anticoagulants: Secondary | ICD-10-CM | POA: Diagnosis not present

## 2021-09-18 DIAGNOSIS — K429 Umbilical hernia without obstruction or gangrene: Secondary | ICD-10-CM

## 2021-09-18 DIAGNOSIS — Z7902 Long term (current) use of antithrombotics/antiplatelets: Secondary | ICD-10-CM | POA: Diagnosis not present

## 2021-09-18 NOTE — Telephone Encounter (Signed)
   Primary Cardiologist: Skeet Latch, MD  Clinical pharmacist have reviewed past medical history, medications and chart for Devon Mathews. .  The following recommendations were made:  Patient with diagnosis of afib on Eliquis for anticoagulation.     Procedure: EGD/colonoscopy Date of procedure: 12/04/21   CHA2DS2-VASc Score = 6  This indicates a 9.7% annual risk of stroke. The patient's score is based upon: CHF History: 1 HTN History: 1 Diabetes History: 0 Stroke History: 2 Vascular Disease History: 1 Age Score: 1 Gender Score: 0    CrCl 54mL/min using adjusted body weight Platelet count 173K   Per office protocol, patient can hold Eliquis 1 day prior to procedure as requested. He should resume as soon as safely possible afterwards.  I will route this recommendation to the requesting party via Epic fax function and remove from pre-op pool.  Please call with questions.  Jossie Ng. Sabriyah Wilcher NP-C    09/18/2021, 12:27 PM [place] [address] Office [phone] Fax [phone]

## 2021-09-18 NOTE — Telephone Encounter (Signed)
Patient with diagnosis of afib on Eliquis for anticoagulation.    Procedure: EGD/colonoscopy Date of procedure: 12/04/21  CHA2DS2-VASc Score = 6  This indicates a 9.7% annual risk of stroke. The patient's score is based upon: CHF History: 1 HTN History: 1 Diabetes History: 0 Stroke History: 2 Vascular Disease History: 1 Age Score: 1 Gender Score: 0  Does also have preDM (did have single A1c in the diabetic range earlier this year but it improved back to prediabetic range on no meds so will not count)  CrCl 74mL/min using adjusted body weight Platelet count 173K  Per office protocol, patient can hold Eliquis 1 day prior to procedure as requested. He should resume as soon as safely possible afterwards.

## 2021-09-18 NOTE — Progress Notes (Signed)
Devon Mathews. 70 y.o. Jun 22, 1951 [id number]  Assessment & Plan:   Encounter Diagnoses  Name Primary?   Esophageal dysphagia Yes   Hx of adenomatous colonic polyps    Melena    Chronic anticoagulation    PAF (paroxysmal atrial fibrillation) (HCC)    Diastasis recti    Umbilical hernia without obstruction and without gangrene    Long term current use of antithrombotics/antiplatelets    Upper GI endoscopy with potential esophageal dilation for dysphagia and question of melena and abdominal cramping  Colonoscopy for polyp surveillance and evaluation of question of melena and abdominal cramping  Educated regarding etiology and nature of diastases recti versus umbilical hernia  Hold Eliquis day before and day of colonoscopy and hold clopidogrel x 5 d prior to procedure and day of.  We will clarify this with cardiology would need to reduce the risk of bleeding from potential esophageal dilation and polypectomy and biopsies.  The patient will have a transient real but rare increased risk of stroke or potentially cardiac event while off these medications but these are necessary in order to perform his higher risk endoscopic procedures.  In the interim he should be careful and cut his food small and chew well and drink adequate liquid when swallowing food.  The typical risks and benefits as well as alternatives of endoscopic procedure(s) have been discussed and reviewed. All questions answered. The patient agrees to proceed.  I appreciate the opportunity to care for this patient. CC: Devon Mathews, Devon Him, MD Devon Mathews  Subjective:   Chief Complaint: History of colon polyps dysphagia question of melena  HPI 70 year old white man with a history of paroxysmal atrial fibrillation, coronary artery disease with non-STEMI, treated with Eliquis and clopidogrel, who also has a history of adenomatous colon polyps.  He has previously been followed by Dr. Benson Mathews but wanted to make a change  in care.  He reports problems with chronic alternating bowel habits and generalized abdominal cramping and is also developed some intermittent solid food dysphagia particularly when eating lunch when he is eating meat.  He has done a great job losing weight through the [facility] and is eating higher protein meals and has noticed some midsternal sticking when he is eating a lunchtime sandwich with meat.  This is a new issue.  In the past couple of months he had a dark black bowel movement once or twice.  Not since.  He has some intermittent generalized abdominal cramping without clear trigger.  Colonoscopy last performed 16 Mar 2018 by Dr. Benson Mathews with tubular adenomas in the sigmoid cecum transverse and descending colon.  In 2014 he had a hepatic flexure adenoma.  In 2019 exam there were a total of 9 polyps maximum 6 mm.  A 3-year surveillance colonoscopy was planned.  He intended to return to Dr. Benson Mathews but there is a new requirement to have a credit card on file and the patient was uncomfortable and had sought care elsewhere.  GI review of systems is otherwise negative. Allergies  Allergen Reactions   Penicillins Hives and Nausea And Vomiting    Did it involve swelling of the face/tongue/throat, SOB, or low BP? No Did it involve sudden or severe rash/hives, skin peeling, or any reaction on the inside of your mouth or nose? No Did you need to seek medical attention at a hospital or doctor's office? No When did it last happen?      30 + years If  all above answers are "NO", may proceed with cephalosporin use.    Current Meds  Medication Sig   Acetaminophen (TYLENOL PO) Take 2 tablets by mouth as needed (help releive pressure in his chest).   amitriptyline (ELAVIL) 25 MG tablet Take 0.5 tablets (12.5 mg total) by mouth at bedtime as needed for sleep.   amLODipine (NORVASC) 5 MG tablet TAKE 1 TABLET BY MOUTH EVERY DAY   atorvastatin (LIPITOR) 80 MG tablet Take 1  tablet (80 mg total) by mouth daily.   clopidogrel (PLAVIX) 75 MG tablet TAKE 1 TABLET BY MOUTH EVERY DAY   ELIQUIS 5 MG TABS tablet TAKE 1 TABLET BY MOUTH TWICE A DAY   escitalopram (LEXAPRO) 10 MG tablet Take 10 mg by mouth daily.   losartan (COZAAR) 50 MG tablet Take 1 tablet (50 mg total) by mouth daily.   metoprolol succinate (TOPROL-XL) 50 MG 24 hr tablet TAKE 1 TABLET BY MOUTH EVERY DAY WITH OR IMMEDIATELY FOLLOWING A MEAL   nitroGLYCERIN (NITROSTAT) 0.4 MG SL tablet Place 1 tablet (0.4 mg total) under the tongue every 5 (five) minutes as needed for chest pain.   pantoprazole (PROTONIX) 40 MG tablet TAKE ONE TABLET BY MOUTH ONE TIME DAILY. must make follow up appointment for further refills   Vitamin D, Cholecalciferol, 25 MCG (1000 UT) TABS Take 5,000 Units by mouth daily.   Past Medical History:  Diagnosis Date   Anxiety    Atrial fibrillation (HCC)    CAD S/P percutaneous coronary angioplasty 09/13/2018    99% p-mLAD (BTW d1&d2) -> SYNERGY DES 3.5 X 12 (3.75 mm). 20% LM.  Cx, small RI & co-dom RCA normal.  EF 65%.    Congestive heart failure (CHF) ([place])    Dizziness 2012   normal findings..   Essential hypertension 07/28/2016   GERD (gastroesophageal reflux disease)    Lower extremity edema 07/28/2016   NSTEMI (non-ST elevated myocardial infarction) ([place]) 09/13/2018   The patient presented 09/13/2018 with a non-ST elevation MI, troponin peak was 0.15   Obesity    Obesity (BMI 30-39.9) 07/28/2016   OSA (obstructive sleep apnea) 07/28/2016   Restless leg syndrome    mild   SCCA (squamous cell carcinoma) of skin 08/24/2021   Left Forehead (in situ-inflitrating)   Shortness of breath 07/28/2016   Squamous cell carcinoma of skin 03/20/2015   right ant scalp(curetx3, 86fu)   Squamous cell carcinoma of skin 01/01/2016   left lateral forehead (curetx3, 15fu)   Squamous cell carcinoma of skin 06/15/2019   left mid anterior scalp (tx afte bx)   Squamous cell carcinoma of skin  01/08/2021   well diff- left forehead   Squamous cell carcinoma of skin 01/08/2021   in situ- mid frontal scalp   TIA (transient ischemic attack)    Tubular adenoma    Past Surgical History:  Procedure Laterality Date   COLONOSCOPY     CORONARY BALLOON ANGIOPLASTY N/A 12/05/2018   Procedure: CORONARY BALLOON ANGIOPLASTY;  Surgeon: [name], MD;  Location: [facility];  Service: Cardiovascular;  Laterality: N/A;   CORONARY STENT INTERVENTION N/A 09/13/2018   Procedure: CORONARY STENT INTERVENTION;  Surgeon: Belva Crome, MD;  Location: [facility];  Service: Cardiovascular;; p-m LAD (btw D1&D2) - SYNERGY DES 3.5 x 12 (3.75 mm).   KNEE ARTHROSCOPY Right    LEFT HEART CATH AND CORONARY ANGIOGRAPHY N/A 09/13/2018   Procedure: LEFT HEART CATH AND CORONARY ANGIOGRAPHY;  Surgeon: Belva Crome, MD;  Location:  [facility];  Service: Cardiovascular;;  99% p-mLAD (BTW d1&d2) -> SYNERGY DES 3.5 X 12 (3.75 mm). 20% LM.  Cx, small RI & co-dom RCA normal.  EF 65%.    LEFT HEART CATH AND CORONARY ANGIOGRAPHY  2003   30-40% proximal segmental stenosis in the LAD   LEFT HEART CATH AND CORONARY ANGIOGRAPHY  2006   EF >50% & no mitral regurgitation   LEFT HEART CATH AND CORONARY ANGIOGRAPHY N/A 12/05/2018   Procedure: LEFT HEART CATH AND CORONARY ANGIOGRAPHY;  Surgeon: [name], MD;  Location: [facility];  Service: Cardiovascular;  Laterality: N/A;   NM MYOVIEW LTD  07/2016   Normal.  EF 55-65% 63%).  No ischemia or infarction.  LOW RISK   NM MYOVIEW LTD  11/2018   6: 34 min.  7.2 METS.  Noted chest pain and tightness.  Horizontal ST of T wave depression (2 mm) II, 3, V5 and V6 (HIGH RISK), large/severe defect basal-apical inferior, inferolateral wall.  Large-severe basal-apical anterior-anterolateral wall.  Medium/moderate defect in basal and mid inferoseptal wall.  Consistent with large prior infarct with peri-infarct ischemia.  HIGH RISK   PERICARDIOCENTESIS  N/A 12/05/2018   Procedure: PERICARDIOCENTESIS;  Surgeon: [name], MD;  Location: [facility];  Service: Cardiovascular;  Laterality: N/A;   SUBXYPHOID PERICARDIAL WINDOW N/A 12/25/2018   Procedure: SUBXYPHOID PERICARDIAL WINDOW;  Surgeon: [name], MD;  Location: [facility];  Service: Thoracic;  Laterality: N/A;   TEE WITHOUT CARDIOVERSION N/A 12/25/2018   Procedure: TRANSESOPHAGEAL ECHOCARDIOGRAM (TEE);  Surgeon: [name], MD;  [facility];  Service: Thoracic;  Laterality: N/A;   tendon detachment Right    arm   TRANSTHORACIC ECHOCARDIOGRAM  07/2016   EF 60-65% with mild LVH.  Mild AI.  Trivial MR and TR.   VASECTOMY     Social History   Social History Narrative   Patient is married with 2 sons and retired   Never smoker no alcohol or tobacco now and no caffeine now   family history includes COPD in his father; Diabetes in his brother, mother, and sister; Heart attack in his paternal grandmother and sister; Heart disease in his father and mother; Heart failure in his father, mother, and sister; Hypertension in his mother; Other in his father and maternal grandfather; Pancreatic cancer in his paternal aunt and paternal uncle; Stroke in his father.   Review of Systems As per HPI.  He has dyspnea on exertion that is much better since he is lost weight some pedal edema back pain and some anxiety symptoms.  All other review of systems are negative.  Objective:   Physical Exam @BP  (!) 120/52   Pulse 64   Ht 5\' 9"  (1.753 m)   Wt 263 lb 9.6 oz (119.6 kg)   BMI 38.93 kg/m @  General:  Well-developed, well-nourished and in no acute distress, obese white man Eyes:  anicteric. Neck:   supple w/o thyromegaly or mass.  Lungs: Clear to auscultation bilaterally. Heart:   S1S2, no rubs, murmurs, gallops. Abdomen: Obese soft, non-tender, no hepatosplenomegaly, or mass and BS+.  There is a rather large diastases recti and a golf ball sized umbilical hernia that is  soft and reducible. Rectal: Deferred until colonoscopy  Neuro:  A&O x 3.  Psych:  appropriate mood and  Affect.   Data Reviewed:  See HPI

## 2021-09-18 NOTE — Telephone Encounter (Signed)
[facility] Pre-operative Risk Assessment     Request for surgical clearance:     Endoscopy Procedure  What type of surgery is being performed?     EGD/Colon  When is this surgery scheduled?     12/04/2021  What type of clearance is required ?   Pharmacy  Are there any medications that need to be held prior to surgery and how long? Eliquis-day before and day of, Plavix-5 days  Practice name and name of physician performing surgery?      [facility]  What is your office phone and fax number?      Phone- [phone]  [phone]  Anesthesia type (None, local, MAC, general) ?       MAC

## 2021-09-18 NOTE — Patient Instructions (Addendum)
You have been scheduled for an endoscopy and colonoscopy. Please follow the written instructions given to you at your visit today. Please pick up your prep supplies at the pharmacy within the next 1-3 days. If you use inhalers (even only as needed), please bring them with you on the day of your procedure.   You will be contacted by our office prior to your procedure for directions on holding your Plavix and Eliquis. If you do not hear from our office 1 week prior to your scheduled procedure, please call [phone] to discuss.  We are providing you with information to read on umbilical hernia's and diastasis recti.  May sure to cut your food into small pieces and chew it well.   I appreciate the opportunity to care for you. Silvano Rusk, MD, [facility]

## 2021-09-21 ENCOUNTER — Encounter (INDEPENDENT_AMBULATORY_CARE_PROVIDER_SITE_OTHER): Payer: Self-pay | Admitting: Adult Health

## 2021-09-21 ENCOUNTER — Telehealth: Payer: Self-pay

## 2021-09-21 ENCOUNTER — Other Ambulatory Visit: Payer: Self-pay

## 2021-09-21 ENCOUNTER — Ambulatory Visit (INDEPENDENT_AMBULATORY_CARE_PROVIDER_SITE_OTHER): Payer: Medicare HMO | Admitting: Adult Health

## 2021-09-21 VITALS — BP 117/63 | HR 59 | Temp 97.7°F | Ht 69.0 in | Wt 259.0 lb

## 2021-09-21 DIAGNOSIS — I10 Essential (primary) hypertension: Secondary | ICD-10-CM

## 2021-09-21 DIAGNOSIS — R7989 Other specified abnormal findings of blood chemistry: Secondary | ICD-10-CM | POA: Diagnosis not present

## 2021-09-21 DIAGNOSIS — E119 Type 2 diabetes mellitus without complications: Secondary | ICD-10-CM

## 2021-09-21 DIAGNOSIS — Z6841 Body Mass Index (BMI) 40.0 and over, adult: Secondary | ICD-10-CM

## 2021-09-21 DIAGNOSIS — E559 Vitamin D deficiency, unspecified: Secondary | ICD-10-CM | POA: Diagnosis not present

## 2021-09-21 NOTE — Telephone Encounter (Signed)
[facility] Pre-operative Risk Assessment     Request for surgical clearance:     Endoscopy Procedure  What type of surgery is being performed?     EGD/Colon  When is this surgery scheduled?     12/04/2021  What type of clearance is required ?   Pharmacy  Are there any medications that need to be held prior to surgery and how long? Plavix, 5 days  Practice name and name of physician performing surgery?      [facility]  What is your office phone and fax number?      Phone- [phone]  [phone]  Anesthesia type (None, local, MAC, general) ?       MAC

## 2021-09-21 NOTE — Progress Notes (Signed)
Chief Complaint:   OBESITY Devon Mathews is here to discuss his progress with his obesity treatment plan along with follow-up of his obesity related diagnoses. Devon Mathews is on the Category 3 Plan and keeping a food journal and adhering to recommended goals of 300-500 calories and 35 grams of protein with lunch and states he is following his eating plan approximately 90% of the time. Devon Mathews states he is walking for 30 minutes 3 times per week.  Today's visit was #: 10 Starting weight: 284 lbs Starting date: 04/28/2021 Today's weight: 259 lbs Today's date: 09/21/2021 Total lbs lost to date: 25 lbs Total lbs lost since last in-office visit: 0  Interim History:  Devon Mathews says he is not terribly surprised that his weight is up - up 5 pounds since last office visit. He will often skip breakfast and eats brunch and dinner.  Of note:  Cardiology recently cleared him for EGD/colonoscopy. Per cards, hold Eliquis (A Fib) one day to procedure, resume as directed.  Subjective:   1. Type 2 diabetes mellitus without complication, without long-term current use of insulin (HCC) Last A1c 6.2 - 04/17/2021. On 03/09/2021, A1c 6.9. He is not on any BG medications.  2. Elevated TSH He denies ever being on levothyroxine. He denies family history of thyroid disorder. He denies excessive fatigue levels.  3. Essential hypertension BP/HR excellent at office visit.  Assessment/Plan:   1. Type 2 diabetes mellitus without complication, without long-term current use of insulin (Devon Mathews) Check labs today.  - Insulin, random - VITAMIN D 25 Hydroxy (Vit-D Deficiency, Fractures)  2. Elevated TSH Check labs.  - TSH + free T4  3. Essential hypertension Will check labs today.  - Comprehensive metabolic panel  4. Obesity with current BMI of 38.4  Devon Mathews is currently in the action stage of change. As such, his goal is to continue with weight loss efforts. He has agreed to the Category 3 Plan and keeping a food journal and  adhering to recommended goals of 350-500 calories and 35 grams of protein with lunch.   Hold Eliquis (A-fib) 1 day prior to procedure, then resume as directed.  Exercise goals:  As is.  Behavioral modification strategies: increasing lean protein intake, decreasing simple carbohydrates, meal planning and cooking strategies, keeping healthy foods in the home, and planning for success.  Devon Mathews has agreed to follow-up with our clinic in 3 weeks. He was informed of the importance of frequent follow-up visits to maximize his success with intensive lifestyle modifications for his multiple health conditions.   Devon Mathews was informed we would discuss his lab results at his next visit unless there is a critical issue that needs to be addressed sooner. Devon Mathews agreed to keep his next visit at the agreed upon time to discuss these results.  Objective:   Blood pressure 117/63, pulse (!) 59, temperature 97.7 F (36.5 C), height 5\' 9"  (1.753 m), weight 259 lb (117.5 kg), SpO2 98 %. Body mass index is 38.25 kg/m.  General: Cooperative, alert, well developed, in no acute distress. HEENT: Conjunctivae and lids unremarkable. Cardiovascular: Regular rhythm.  Lungs: Normal work of breathing. Neurologic: No focal deficits.   Lab Results  Component Value Date   CREATININE 1.3 04/17/2021   BUN 19 04/17/2021   NA 140 04/17/2021   K 4.5 04/17/2021   CL 100 03/09/2021   CO2 26 (A) 04/17/2021   Lab Results  Component Value Date   ALT 38 04/17/2021   AST 28 04/17/2021   ALKPHOS 73  04/17/2021   BILITOT 1.1 12/20/2018   Lab Results  Component Value Date   HGBA1C 6.2 04/17/2021   HGBA1C 6.9 (H) 03/09/2021   HGBA1C 5.5 12/21/2018   HGBA1C 5.2 09/14/2018   HGBA1C 5.7 (H) 06/27/2013   Lab Results  Component Value Date   INSULIN 55.5 (H) 04/28/2021   Lab Results  Component Value Date   TSH 5.290 (H) 04/28/2021   Lab Results  Component Value Date   CHOL 139 04/17/2021   HDL 33 (A) 04/17/2021   LDLCALC 85  04/17/2021   TRIG 106 04/17/2021   CHOLHDL 3.0 12/21/2018   Lab Results  Component Value Date   VD25OH 73.3 04/28/2021   Lab Results  Component Value Date   WBC 4.8 04/17/2021   HGB 12.8 (A) 04/17/2021   HCT 37 (A) 04/17/2021   MCV 98 (H) 06/25/2020   PLT 173 04/17/2021   Attestation Statements:   Reviewed by clinician on day of visit: allergies, medications, problem list, medical history, surgical history, family history, social history, and previous encounter notes.  I, Water quality scientist, CMA, am acting as Location manager for Mina Marble, NP.  I have reviewed the above documentation for accuracy and completeness, and I agree with the above. -  Ndidi Nesby d. Adaiah Morken, NP-C

## 2021-09-22 LAB — COMPREHENSIVE METABOLIC PANEL
ALT: 18 IU/L (ref 0–44)
AST: 16 IU/L (ref 0–40)
Albumin/Globulin Ratio: 1.7 (ref 1.2–2.2)
Albumin: 3.8 g/dL (ref 3.8–4.8)
Alkaline Phosphatase: 79 IU/L (ref 44–121)
BUN/Creatinine Ratio: 15 (ref 10–24)
BUN: 15 mg/dL (ref 8–27)
Bilirubin Total: 0.5 mg/dL (ref 0.0–1.2)
CO2: 25 mmol/L (ref 20–29)
Calcium: 9.2 mg/dL (ref 8.6–10.2)
Chloride: 105 mmol/L (ref 96–106)
Creatinine, Ser: 1.03 mg/dL (ref 0.76–1.27)
Globulin, Total: 2.2 g/dL (ref 1.5–4.5)
Glucose: 99 mg/dL (ref 70–99)
Potassium: 4.9 mmol/L (ref 3.5–5.2)
Sodium: 140 mmol/L (ref 134–144)
Total Protein: 6 g/dL (ref 6.0–8.5)
eGFR: 79 mL/min/{1.73_m2} (ref >59)

## 2021-09-22 LAB — HEMOGLOBIN A1C
Est. average glucose Bld gHb Est-mCnc: 114 mg/dL
Hgb A1c MFr Bld: 5.6 % (ref 4.8–5.6)

## 2021-09-22 LAB — VITAMIN D 25 HYDROXY (VIT D DEFICIENCY, FRACTURES): Vit D, 25-Hydroxy: 77 ng/mL (ref 30.0–100.0)

## 2021-09-22 LAB — TSH+FREE T4
Free T4: 0.9 ng/dL (ref 0.82–1.77)
TSH: 5.53 u[IU]/mL — ABNORMAL HIGH (ref 0.450–4.500)

## 2021-09-22 LAB — INSULIN, RANDOM: INSULIN: 32.1 u[IU]/mL — ABNORMAL HIGH (ref 2.6–24.9)

## 2021-09-22 NOTE — Telephone Encounter (Signed)
Dr. Oval [name] Pt has a complex cardiac history that includes CAD with last PCI in Feb 2020. He is on plavix in the setting of eliquis. We have been asked to hold plavix for colonoscopy. OK to hold?

## 2021-10-05 ENCOUNTER — Telehealth: Payer: Self-pay

## 2021-10-05 NOTE — Telephone Encounter (Signed)
Mr Modica informed that he can hold his Eliquis the day before his procedure and day of. He was also informed to hold his Plavix for 5 days prior to his procedure. He verbalized understanding.

## 2021-10-05 NOTE — Telephone Encounter (Signed)
° °  Primary Cardiologist: Skeet Latch, MD  Chart reviewed as part of pre-operative protocol coverage. Given past medical history and time since last visit, based on ACC/AHA guidelines, Devon Mathews. would be at acceptable risk for the planned procedure without further cardiovascular testing.   His Plavix may be held for 5 days prior to his procedure.  Please resume as soon as hemostasis is achieved.  I will route this recommendation to the requesting party via Epic fax function and remove from pre-op pool.  Please call with questions.  Jossie Ng. Jackie Russman NP-C    10/05/2021, 8:52 AM [place] [address] Office [phone] Fax [phone]

## 2021-10-13 ENCOUNTER — Ambulatory Visit (INDEPENDENT_AMBULATORY_CARE_PROVIDER_SITE_OTHER): Payer: Medicare HMO | Admitting: Adult Health

## 2021-10-13 ENCOUNTER — Other Ambulatory Visit: Payer: Self-pay

## 2021-10-13 ENCOUNTER — Encounter (INDEPENDENT_AMBULATORY_CARE_PROVIDER_SITE_OTHER): Payer: Self-pay | Admitting: Adult Health

## 2021-10-13 VITALS — BP 132/70 | HR 66 | Temp 97.9°F | Ht 69.0 in | Wt 257.0 lb

## 2021-10-13 DIAGNOSIS — E038 Other specified hypothyroidism: Secondary | ICD-10-CM

## 2021-10-13 DIAGNOSIS — E119 Type 2 diabetes mellitus without complications: Secondary | ICD-10-CM

## 2021-10-13 DIAGNOSIS — E559 Vitamin D deficiency, unspecified: Secondary | ICD-10-CM | POA: Diagnosis not present

## 2021-10-13 DIAGNOSIS — Z6841 Body Mass Index (BMI) 40.0 and over, adult: Secondary | ICD-10-CM | POA: Diagnosis not present

## 2021-10-13 NOTE — Progress Notes (Signed)
Chief Complaint:   OBESITY Devon Mathews is here to discuss his progress with his obesity treatment plan along with follow-up of his obesity related diagnoses. Devon Mathews is on the Category 3 Plan and keeping a food journal and adhering to recommended goals of 350-500 calories and 35 grams of protein at lunch daily and states he is following his eating plan approximately 50% of the time. Devon Mathews states he is walking for 30 minutes 1-3 times per week.  Today's visit was #: 11 Starting weight: 284 lbs Starting date: 04/28/2021 Today's weight: 257 lbs Today's date: 10/13/2021 Total lbs lost to date: 27 Total lbs lost since last in-office visit: 2  Interim History:  Devon Mathews is quite pleased to have lost 2 lbs over the holidays! He utilized [employer] when eating off plan,   His interval goal is to loss down to 225 lbs/MI of 33 Ultimate goal is 200 lbs/BMI of 29  Of note: Miocardial infarction with PCI in 2019. Pericardial window in March 2020.  He has a family history of sig Heart Disease both his Mother and Father. His sister was on a heart transplant list- she passed away prior to organ transplant.  Subjective:   1. Type 2 diabetes mellitus without complication, without long-term current use of insulin (Devon Mathews) Devon Mathews CMP on 09/21/2021 was stable, and his A1c on 09/21/2021 was 5.6. At goal without any anti-diabetic medications. I discussed labs with the patient today.  2. Vitamin D deficiency Devon Mathews's Vit D level on 09/21/2021 was 77.0, excellent. He is on OTC Vitamin D3 5,000 IU daily. I discussed labs with the patient today.  3. Subclinical hypothyroidism Devon Mathews's TSH was 5.5- slightly elevated, Normal Free T4- 0.90 He denies ever being on levothyroxine. He denies family history of thyroid disorder. He denies excessive fatigue levels. I discussed labs with the patient today. Assessment/Plan:   1. Type 2 diabetes mellitus without complication, without long-term current use of insulin (HCC) Devon Mathews will  continue his Category 3 with journaling for lunch, and he will continue with walking. Good blood sugar control is important to decrease the likelihood of diabetic complications such as nephropathy, neuropathy, limb loss, blindness, coronary artery disease, and death. Intensive lifestyle modification including diet, exercise and weight loss are the first line of treatment for diabetes.   2. Vitamin D deficiency Low Vitamin D level contributes to fatigue and are associated with obesity, breast, and colon cancer. Devon Mathews will continue his OTC Vitamin D supplementation. He will follow-up for routine testing of Vitamin D, at least 2-3 times per year to avoid over-replacement.  3. Subclinical hypothyroidism We will continue to monitor labs, and Devon Mathews will continue to follow up as directed. Orders and follow up as documented in patient record.  Counseling Good thyroid control is important for overall health. Supratherapeutic thyroid levels are dangerous and will not improve weight loss results.  4. Obesity with current BMI of 38.1 Devon Mathews is currently in the action stage of change. As such, his goal is to continue with weight loss efforts. He has agreed to the Category 3 Plan and keeping a food journal and adhering to recommended goals of 350-500 calories and 35 grams of protein at lunch daily.   Exercise goals: As is.  Behavioral modification strategies: increasing lean protein intake, decreasing simple carbohydrates, meal planning and cooking strategies, keeping healthy foods in the home, and planning for success.  Devon Mathews has agreed to follow-up with our clinic in 4 weeks with Abby Potash, PA-C. He was informed of  the importance of frequent follow-up visits to maximize his success with intensive lifestyle modifications for his multiple health conditions.   Objective:   Blood pressure 132/70, pulse 66, temperature 97.9 F (36.6 C), temperature source Oral, height 5\' 9"  (1.753 m), weight 257 lb (116.6 kg),  SpO2 97 %. Body mass index is 37.95 kg/m.  General: Cooperative, alert, well developed, in no acute distress. HEENT: Conjunctivae and lids unremarkable. Cardiovascular: Regular rhythm.  Lungs: Normal work of breathing. Neurologic: No focal deficits.   Lab Results  Component Value Date   CREATININE 1.03 09/21/2021   BUN 15 09/21/2021   NA 140 09/21/2021   K 4.9 09/21/2021   CL 105 09/21/2021   CO2 25 09/21/2021   Lab Results  Component Value Date   ALT 18 09/21/2021   AST 16 09/21/2021   ALKPHOS 79 09/21/2021   BILITOT 0.5 09/21/2021   Lab Results  Component Value Date   HGBA1C 5.6 09/21/2021   HGBA1C 6.2 04/17/2021   HGBA1C 6.9 (H) 03/09/2021   HGBA1C 5.5 12/21/2018   HGBA1C 5.2 09/14/2018   Lab Results  Component Value Date   INSULIN 32.1 (H) 09/21/2021   INSULIN 55.5 (H) 04/28/2021   Lab Results  Component Value Date   TSH 5.530 (H) 09/21/2021   Lab Results  Component Value Date   CHOL 139 04/17/2021   HDL 33 (A) 04/17/2021   LDLCALC 85 04/17/2021   TRIG 106 04/17/2021   CHOLHDL 3.0 12/21/2018   Lab Results  Component Value Date   VD25OH 77.0 09/21/2021   VD25OH 73.3 04/28/2021   Lab Results  Component Value Date   WBC 4.8 04/17/2021   HGB 12.8 (A) 04/17/2021   HCT 37 (A) 04/17/2021   MCV 98 (H) 06/25/2020   PLT 173 04/17/2021   No results found for: IRON, TIBC, FERRITIN  Attestation Statements:   Reviewed by clinician on day of visit: allergies, medications, problem list, medical history, surgical history, family history, social history, and previous encounter notes.  Time spent on visit including pre-visit chart review and post-visit care and charting was 28 minutes.    Wilhemena Durie, am acting as transcriptionist for Mina Marble, NP.  I have reviewed the above documentation for accuracy and completeness, and I agree with the above. - Fergus Throne d. Elody Kleinsasser, NP-C

## 2021-10-26 ENCOUNTER — Telehealth (HOSPITAL_BASED_OUTPATIENT_CLINIC_OR_DEPARTMENT_OTHER): Payer: Self-pay | Admitting: Cardiovascular Disease

## 2021-10-26 ENCOUNTER — Other Ambulatory Visit: Payer: Self-pay | Admitting: Adult Health

## 2021-10-26 MED ORDER — LOSARTAN POTASSIUM 50 MG PO TABS: 50.0000 mg | ORAL_TABLET | Freq: Every day | ORAL | 1 refills | Status: DC

## 2021-10-26 NOTE — Telephone Encounter (Signed)
Prescription refill request for Eliquis received. Indication:Afib Last office visit:7/22 Scr:1.0 Age: 71 Weight:116.6 kg  Prescription refilled

## 2021-10-26 NOTE — Telephone Encounter (Signed)
Received fax from [facility] in [place] requesting refills for Losartan 50 mg. Rx request sent to pharmacy.

## 2021-10-28 ENCOUNTER — Telehealth (HOSPITAL_BASED_OUTPATIENT_CLINIC_OR_DEPARTMENT_OTHER): Payer: Self-pay | Admitting: Cardiovascular Disease

## 2021-10-28 MED ORDER — LOSARTAN POTASSIUM 50 MG PO TABS: 50.0000 mg | ORAL_TABLET | Freq: Every day | ORAL | 1 refills | Status: DC

## 2021-10-28 NOTE — Telephone Encounter (Signed)
Received fax from [facility] in [place] requesting New Rx for Losartan 50 mg. Rx request sent to pharmacy.

## 2021-11-05 ENCOUNTER — Ambulatory Visit (INDEPENDENT_AMBULATORY_CARE_PROVIDER_SITE_OTHER): Payer: Medicare HMO | Admitting: Physician Assistant

## 2021-11-05 ENCOUNTER — Encounter (INDEPENDENT_AMBULATORY_CARE_PROVIDER_SITE_OTHER): Payer: Self-pay | Admitting: Physician Assistant

## 2021-11-05 ENCOUNTER — Other Ambulatory Visit: Payer: Self-pay

## 2021-11-05 VITALS — BP 114/65 | HR 58 | Temp 98.4°F | Ht 69.0 in | Wt 259.0 lb

## 2021-11-05 DIAGNOSIS — D0439 Carcinoma in situ of skin of other parts of face: Secondary | ICD-10-CM | POA: Diagnosis not present

## 2021-11-05 DIAGNOSIS — Z6838 Body mass index (BMI) 38.0-38.9, adult: Secondary | ICD-10-CM

## 2021-11-05 DIAGNOSIS — E119 Type 2 diabetes mellitus without complications: Secondary | ICD-10-CM

## 2021-11-05 NOTE — Progress Notes (Signed)
Chief Complaint:   OBESITY Devon Mathews is here to discuss his progress with his obesity treatment plan along with follow-up of his obesity related diagnoses. Devon Mathews is on the Category 3 Plan and states he is following his eating plan approximately 80-85% of the time. Devon Mathews states he is doing 0 minutes 0 times per week.  Today's visit was #: 12 Starting weight: 284 lbs Starting date: 04/28/2021 Today's weight: 259 lbs Today's date: 11/05/2021 Total lbs lost to date: 25 lbs Total lbs lost since last in-office visit: 0  Interim History: Devon Mathews is traveling more for work, 4 days out of the week. He is eating out for lunch and dinner and is eating fast food burgers. When he is home, he has no trouble with the plan.  Subjective:   1. Type 2 diabetes mellitus without complication, without long-term current use of insulin (HCC) Devon Mathews's last A1C was 5.6. He is currently not on medications. He denies polyphagia.   Assessment/Plan:   1. Type 2 diabetes mellitus without complication, without long-term current use of insulin (HCC) Devon Mathews will continue with plan. Good blood sugar control is important to decrease the likelihood of diabetic complications such as nephropathy, neuropathy, limb loss, blindness, coronary artery disease, and death. Intensive lifestyle modification including diet, exercise and weight loss are the first line of treatment for diabetes.   2. Obesity with current BMI of 38.23 Devon Mathews is currently in the action stage of change. As such, his goal is to continue with weight loss efforts. He has agreed to the Category 3 Plan.   Lunch and dinner journaling numbers were given along with eating out sheet.    Exercise goals: No exercise has been prescribed at this time.  Behavioral modification strategies: meal planning and cooking strategies and planning for success.  Devon Mathews has agreed to follow-up with our clinic in 4 weeks. He was informed of the importance of frequent follow-up visits to maximize his  success with intensive lifestyle modifications for his multiple health conditions.   Objective:   Blood pressure 114/65, pulse (!) 58, temperature 98.4 F (36.9 C), temperature source Oral, height 5\' 9"  (1.753 m), weight 259 lb (117.5 kg), SpO2 98 %. Body mass index is 38.25 kg/m.  General: Cooperative, alert, well developed, in no acute distress. HEENT: Conjunctivae and lids unremarkable. Cardiovascular: Regular rhythm.  Lungs: Normal work of breathing. Neurologic: No focal deficits.   Lab Results  Component Value Date   CREATININE 1.03 09/21/2021   BUN 15 09/21/2021   NA 140 09/21/2021   K 4.9 09/21/2021   CL 105 09/21/2021   CO2 25 09/21/2021   Lab Results  Component Value Date   ALT 18 09/21/2021   AST 16 09/21/2021   ALKPHOS 79 09/21/2021   BILITOT 0.5 09/21/2021   Lab Results  Component Value Date   HGBA1C 5.6 09/21/2021   HGBA1C 6.2 04/17/2021   HGBA1C 6.9 (H) 03/09/2021   HGBA1C 5.5 12/21/2018   HGBA1C 5.2 09/14/2018   Lab Results  Component Value Date   INSULIN 32.1 (H) 09/21/2021   INSULIN 55.5 (H) 04/28/2021   Lab Results  Component Value Date   TSH 5.530 (H) 09/21/2021   Lab Results  Component Value Date   CHOL 139 04/17/2021   HDL 33 (A) 04/17/2021   LDLCALC 85 04/17/2021   TRIG 106 04/17/2021   CHOLHDL 3.0 12/21/2018   Lab Results  Component Value Date   VD25OH 77.0 09/21/2021   VD25OH 73.3 04/28/2021   Lab  Results  Component Value Date   WBC 4.8 04/17/2021   HGB 12.8 (A) 04/17/2021   HCT 37 (A) 04/17/2021   MCV 98 (H) 06/25/2020   PLT 173 04/17/2021   No results found for: IRON, TIBC, FERRITIN  Obesity Behavioral Intervention:   Approximately 15 minutes were spent on the discussion below.  ASK: We discussed the diagnosis of obesity with Devon Mathews today and Devon Mathews agreed to give [place] permission to discuss obesity behavioral modification therapy today.  ASSESS: Devon Mathews has the diagnosis of obesity and his BMI today is 38.2. Devon Mathews is in the  action stage of change.   ADVISE: Devon Mathews was educated on the multiple health risks of obesity as well as the benefit of weight loss to improve his health. He was advised of the need for long term treatment and the importance of lifestyle modifications to improve his current health and to decrease his risk of future health problems.  AGREE: Multiple dietary modification options and treatment options were discussed and Devon Mathews agreed to follow the recommendations documented in the above note.  ARRANGE: Devon Mathews was educated on the importance of frequent visits to treat obesity as outlined per CMS and USPSTF guidelines and agreed to schedule his next follow up appointment today.  Attestation Statements:   Reviewed by clinician on day of visit: allergies, medications, problem list, medical history, surgical history, family history, social history, and previous encounter notes.  I, Tonye Pearson, am acting as Location manager for [employer], PA-C.  I have reviewed the above documentation for accuracy and completeness, and I agree with the above. Abby Potash, PA-C

## 2021-11-20 DIAGNOSIS — M1990 Unspecified osteoarthritis, unspecified site: Secondary | ICD-10-CM | POA: Diagnosis not present

## 2021-11-20 DIAGNOSIS — D126 Benign neoplasm of colon, unspecified: Secondary | ICD-10-CM | POA: Diagnosis not present

## 2021-11-20 DIAGNOSIS — Z1339 Encounter for screening examination for other mental health and behavioral disorders: Secondary | ICD-10-CM | POA: Diagnosis not present

## 2021-11-20 DIAGNOSIS — M109 Gout, unspecified: Secondary | ICD-10-CM | POA: Diagnosis not present

## 2021-11-20 DIAGNOSIS — G4733 Obstructive sleep apnea (adult) (pediatric): Secondary | ICD-10-CM | POA: Diagnosis not present

## 2021-11-20 DIAGNOSIS — E7800 Pure hypercholesterolemia, unspecified: Secondary | ICD-10-CM | POA: Diagnosis not present

## 2021-11-20 DIAGNOSIS — Z7901 Long term (current) use of anticoagulants: Secondary | ICD-10-CM | POA: Diagnosis not present

## 2021-11-20 DIAGNOSIS — Z1331 Encounter for screening for depression: Secondary | ICD-10-CM | POA: Diagnosis not present

## 2021-11-20 DIAGNOSIS — I119 Hypertensive heart disease without heart failure: Secondary | ICD-10-CM | POA: Diagnosis not present

## 2021-11-20 DIAGNOSIS — R7301 Impaired fasting glucose: Secondary | ICD-10-CM | POA: Diagnosis not present

## 2021-11-20 DIAGNOSIS — I509 Heart failure, unspecified: Secondary | ICD-10-CM | POA: Diagnosis not present

## 2021-11-20 DIAGNOSIS — I2510 Atherosclerotic heart disease of native coronary artery without angina pectoris: Secondary | ICD-10-CM | POA: Diagnosis not present

## 2021-11-20 DIAGNOSIS — I480 Paroxysmal atrial fibrillation: Secondary | ICD-10-CM | POA: Diagnosis not present

## 2021-11-30 ENCOUNTER — Encounter: Payer: Self-pay | Admitting: Internal Medicine

## 2021-12-03 ENCOUNTER — Ambulatory Visit (INDEPENDENT_AMBULATORY_CARE_PROVIDER_SITE_OTHER): Payer: Medicare HMO | Admitting: Physician Assistant

## 2021-12-03 ENCOUNTER — Other Ambulatory Visit: Payer: Self-pay

## 2021-12-03 ENCOUNTER — Encounter (INDEPENDENT_AMBULATORY_CARE_PROVIDER_SITE_OTHER): Payer: Self-pay | Admitting: Physician Assistant

## 2021-12-03 VITALS — BP 133/63 | HR 62 | Temp 98.2°F | Ht 69.0 in | Wt 264.0 lb

## 2021-12-03 DIAGNOSIS — E669 Obesity, unspecified: Secondary | ICD-10-CM

## 2021-12-03 DIAGNOSIS — E1169 Type 2 diabetes mellitus with other specified complication: Secondary | ICD-10-CM | POA: Diagnosis not present

## 2021-12-03 DIAGNOSIS — Z6838 Body mass index (BMI) 38.0-38.9, adult: Secondary | ICD-10-CM

## 2021-12-03 DIAGNOSIS — Z6841 Body Mass Index (BMI) 40.0 and over, adult: Secondary | ICD-10-CM

## 2021-12-03 DIAGNOSIS — E119 Type 2 diabetes mellitus without complications: Secondary | ICD-10-CM

## 2021-12-03 NOTE — Progress Notes (Signed)
Chief Complaint:   OBESITY Akin is here to discuss his progress with his obesity treatment plan along with follow-up of his obesity related diagnoses. Holt is on the Category 3 Plan and states he is following his eating plan approximately 85% of the time. Jiovanni states he is doing 0 minutes 0 times per week.  Today's visit was #: 51 Starting weight: 284 lbs Starting date: 04/28/2021 Today's weight: 264 lbs Today's date: 12/03/2021 Total lbs lost to date: 20 Total lbs lost since last in-office visit: 0  Interim History: Rod continues to travel for work and he is eating fast food at least 4 times per week. He is choosing grilled chicken sandwiches for lunch. He cut out half and half tea. He is not hungry throughout the day.  Subjective:   1. Type 2 diabetes mellitus without complication, without long-term current use of insulin ([place]) Keondre's last A1c was 5.6. He is not on medications currently, and his appetite is controlled.  Assessment/Plan:   1. Type 2 diabetes mellitus without complication, without long-term current use of insulin (HCC) We discussed Ozempic as an option today. Sumedh will continue his meal plan. Good blood sugar control is important to decrease the likelihood of diabetic complications such as nephropathy, neuropathy, limb loss, blindness, coronary artery disease, and death. Intensive lifestyle modification including diet, exercise and weight loss are the first line of treatment for diabetes.   2. Obesity with current BMI of 38.97 Micahel is currently in the action stage of change. As such, his goal is to continue with weight loss efforts. He has agreed to the Category 3 Plan.   Exercise goals: No exercise has been prescribed at this time.  Behavioral modification strategies: decreasing eating out and meal planning and cooking strategies.  Jamaurion has agreed to follow-up with our clinic in 2 to 3 weeks. He was informed of the importance of frequent follow-up visits to maximize  his success with intensive lifestyle modifications for his multiple health conditions.   Objective:   Blood pressure 133/63, pulse 62, temperature 98.2 F (36.8 C), height 5\' 9"  (1.753 m), weight 264 lb (119.7 kg), SpO2 95 %. Body mass index is 38.99 kg/m.  General: Cooperative, alert, well developed, in no acute distress. HEENT: Conjunctivae and lids unremarkable. Cardiovascular: Regular rhythm.  Lungs: Normal work of breathing. Neurologic: No focal deficits.   Lab Results  Component Value Date   CREATININE 1.03 09/21/2021   BUN 15 09/21/2021   NA 140 09/21/2021   K 4.9 09/21/2021   CL 105 09/21/2021   CO2 25 09/21/2021   Lab Results  Component Value Date   ALT 18 09/21/2021   AST 16 09/21/2021   ALKPHOS 79 09/21/2021   BILITOT 0.5 09/21/2021   Lab Results  Component Value Date   HGBA1C 5.6 09/21/2021   HGBA1C 6.2 04/17/2021   HGBA1C 6.9 (H) 03/09/2021   HGBA1C 5.5 12/21/2018   HGBA1C 5.2 09/14/2018   Lab Results  Component Value Date   INSULIN 32.1 (H) 09/21/2021   INSULIN 55.5 (H) 04/28/2021   Lab Results  Component Value Date   TSH 5.530 (H) 09/21/2021   Lab Results  Component Value Date   CHOL 139 04/17/2021   HDL 33 (A) 04/17/2021   LDLCALC 85 04/17/2021   TRIG 106 04/17/2021   CHOLHDL 3.0 12/21/2018   Lab Results  Component Value Date   VD25OH 77.0 09/21/2021   VD25OH 73.3 04/28/2021   Lab Results  Component Value Date  WBC 4.8 04/17/2021   HGB 12.8 (A) 04/17/2021   HCT 37 (A) 04/17/2021   MCV 98 (H) 06/25/2020   PLT 173 04/17/2021   No results found for: IRON, TIBC, FERRITIN  Obesity Behavioral Intervention:   Approximately 15 minutes were spent on the discussion below.  ASK: We discussed the diagnosis of obesity with Carloyn Manner today and Mahonri agreed to give [place] permission to discuss obesity behavioral modification therapy today.  ASSESS: Shone has the diagnosis of obesity and his BMI today is 39.0. Suhayb is in the action stage of change.    ADVISE: Sigifredo was educated on the multiple health risks of obesity as well as the benefit of weight loss to improve his health. He was advised of the need for long term treatment and the importance of lifestyle modifications to improve his current health and to decrease his risk of future health problems.  AGREE: Multiple dietary modification options and treatment options were discussed and Loel agreed to follow the recommendations documented in the above note.  ARRANGE: Tanveer was educated on the importance of frequent visits to treat obesity as outlined per CMS and USPSTF guidelines and agreed to schedule his next follow up appointment today.  Attestation Statements:   Reviewed by clinician on day of visit: allergies, medications, problem list, medical history, surgical history, family history, social history, and previous encounter notes.   Wilhemena Durie, am acting as transcriptionist for [employer], PA-C.  I have reviewed the above documentation for accuracy and completeness, and I agree with the above. Abby Potash, PA-C

## 2021-12-04 ENCOUNTER — Encounter: Payer: Self-pay | Admitting: Internal Medicine

## 2021-12-04 ENCOUNTER — Ambulatory Visit (AMBULATORY_SURGERY_CENTER): Payer: Medicare HMO | Admitting: Internal Medicine

## 2021-12-04 VITALS — BP 117/85 | HR 55 | Temp 97.3°F | Resp 17 | Ht 69.0 in | Wt 263.0 lb

## 2021-12-04 DIAGNOSIS — R1319 Other dysphagia: Secondary | ICD-10-CM | POA: Diagnosis not present

## 2021-12-04 DIAGNOSIS — K259 Gastric ulcer, unspecified as acute or chronic, without hemorrhage or perforation: Secondary | ICD-10-CM | POA: Diagnosis not present

## 2021-12-04 DIAGNOSIS — K317 Polyp of stomach and duodenum: Secondary | ICD-10-CM | POA: Diagnosis not present

## 2021-12-04 DIAGNOSIS — K3189 Other diseases of stomach and duodenum: Secondary | ICD-10-CM | POA: Diagnosis not present

## 2021-12-04 DIAGNOSIS — K639 Disease of intestine, unspecified: Secondary | ICD-10-CM

## 2021-12-04 DIAGNOSIS — K5000 Crohn's disease of small intestine without complications: Secondary | ICD-10-CM | POA: Diagnosis not present

## 2021-12-04 DIAGNOSIS — D122 Benign neoplasm of ascending colon: Secondary | ICD-10-CM | POA: Diagnosis not present

## 2021-12-04 DIAGNOSIS — D124 Benign neoplasm of descending colon: Secondary | ICD-10-CM

## 2021-12-04 DIAGNOSIS — Z86010 Personal history of colonic polyps: Secondary | ICD-10-CM

## 2021-12-04 DIAGNOSIS — G4733 Obstructive sleep apnea (adult) (pediatric): Secondary | ICD-10-CM | POA: Diagnosis not present

## 2021-12-04 DIAGNOSIS — I2510 Atherosclerotic heart disease of native coronary artery without angina pectoris: Secondary | ICD-10-CM | POA: Diagnosis not present

## 2021-12-04 MED ORDER — SODIUM CHLORIDE 0.9 % IV SOLN: 500.0000 mL | INTRAVENOUS | Status: DC

## 2021-12-04 NOTE — Progress Notes (Signed)
Called to room to assist during endoscopic procedure.  Patient ID and intended procedure confirmed with present staff. Received instructions for my participation in the procedure from the performing physician.  

## 2021-12-04 NOTE — Patient Instructions (Addendum)
There were multiple stomach polyps that look benign. Biopsies taken. You may be bleeding from some of these - wanted to see what they are before any next steps though I think they are benign.  I dilated the esophagus to see if that will help you swallow better.   Colonoscopy showed ? Inflammation in end of small bowel (biopsied) and also 2 tiny polyps (removed).  I will contact with results and plans.  Restart Eliquis and clopidogrel tomorrow.  I appreciate the opportunity to care for you. Gatha Mayer, MD, FACG   YOU HAD AN ENDOSCOPIC PROCEDURE TODAY AT [facility]:   Refer to the procedure report that was given to you for any specific questions about what was found during the examination.  If the procedure report does not answer your questions, please call your gastroenterologist to clarify.  If you requested that your care partner not be given the details of your procedure findings, then the procedure report has been included in a sealed envelope for you to review at your convenience later.  YOU SHOULD EXPECT: Some feelings of bloating in the abdomen. Passage of more gas than usual.  Walking can help get rid of the air that was put into your GI tract during the procedure and reduce the bloating. If you had a lower endoscopy (such as a colonoscopy or flexible sigmoidoscopy) you may notice spotting of blood in your stool or on the toilet paper. If you underwent a bowel prep for your procedure, you may not have a normal bowel movement for a few days.  Please Note:  You might notice some irritation and congestion in your nose or some drainage.  This is from the oxygen used during your procedure.  There is no need for concern and it should clear up in a day or so.  SYMPTOMS TO REPORT IMMEDIATELY:  Following lower endoscopy (colonoscopy or flexible sigmoidoscopy):  Excessive amounts of blood in the stool  Significant tenderness or worsening of abdominal pains  Swelling of  the abdomen that is new, acute  Fever of 100F or higher  Following upper endoscopy (EGD)  Vomiting of blood or coffee ground material  New chest pain or pain under the shoulder blades  Painful or persistently difficult swallowing  New shortness of breath  Fever of 100F or higher  Black, tarry-looking stools  For urgent or emergent issues, a gastroenterologist can be reached at any hour by calling [phone]. Do not use MyChart messaging for urgent concerns.    DIET:  We do recommend a small meal at first, but then you may proceed to your regular diet.  Drink plenty of fluids but you should avoid alcoholic beverages for 24 hours.  ACTIVITY:  You should plan to take it easy for the rest of today and you should NOT DRIVE or use heavy machinery until tomorrow (because of the sedation medicines used during the test).    FOLLOW UP: Our staff will call the number listed on your records 48-72 hours following your procedure to check on you and address any questions or concerns that you may have regarding the information given to you following your procedure. If we do not reach you, we will leave a message.  We will attempt to reach you two times.  During this call, we will ask if you have developed any symptoms of COVID 19. If you develop any symptoms (ie: fever, flu-like symptoms, shortness of breath, cough etc.) before then, please call [phone].  If you test positive for Covid 19 in the 2 weeks post procedure, please call and report this information to [place].    If any biopsies were taken you will be contacted by phone or by letter within the next 1-3 weeks.  Please call us at [phone] if you have not heard about the biopsies in 3 weeks.    SIGNATURES/CONFIDENTIALITY: You and/or your care partner have signed paperwork which will be entered into your electronic medical record.  These signatures attest to the fact that that the information above on your After Visit Summary has been  reviewed and is understood.  Full responsibility of the confidentiality of this discharge information lies with you and/or your care-partner.

## 2021-12-04 NOTE — Op Note (Signed)
[facility] Patient Name: Devon Mathews Procedure Date: 12/04/2021 1:31 PM MRN: [id number] Endoscopist: Gatha Mayer , MD Age: 71 Referring MD:  Date of Birth: 1951/04/01 Gender: Male Account #: [id number] Procedure:                Upper GI endoscopy Indications:              Dysphagia, Melena Medicines:                Propofol per Anesthesia, Monitored Anesthesia Care Procedure:                Pre-Anesthesia Assessment:                           - Prior to the procedure, a History and Physical                            was performed, and patient medications and                            allergies were reviewed. The patient's tolerance of                            previous anesthesia was also reviewed. The risks                            and benefits of the procedure and the sedation                            options and risks were discussed with the patient.                            All questions were answered, and informed consent                            was obtained. Prior Anticoagulants: The patient                            last took Eliquis (apixaban) 2 days and Plavix                            (clopidogrel) 5 days prior to the procedure. ASA                            Grade Assessment: III - A patient with severe                            systemic disease. After reviewing the risks and                            benefits, the patient was deemed in satisfactory                            condition to undergo the procedure.  After obtaining informed consent, the endoscope was                            passed under direct vision. Throughout the                            procedure, the patient's blood pressure, pulse, and                            oxygen saturations were monitored continuously. The                            Endoscope was introduced through the mouth, and                            advanced to the second part of  duodenum. The upper                            GI endoscopy was accomplished without difficulty.                            The patient tolerated the procedure well. Scope In: Scope Out: Findings:                 No endoscopic abnormality was evident in the                            esophagus to explain the patient's complaint of                            dysphagia. It was decided, however, to proceed with                            dilation at the gastroesophageal junction. A TTS                            dilator was passed through the scope. Dilation with                            an 18-19-20 mm balloon dilator was performed to 20                            mm. The dilation site was examined and showed no                            change. Estimated blood loss: none.                           Multiple 2 to 8 mm semi-sessile polyps with no                            bleeding and stigmata of recent bleeding were found  in the gastric body and in the gastric antrum.                            Biopsies were taken with a cold forceps for                            histology. Verification of patient identification                            for the specimen was done. Estimated blood loss was                            minimal.                           The examined duodenum was normal.                           The cardia and gastric fundus were normal on                            retroflexion. Complications:            No immediate complications. Estimated Blood Loss:     Estimated blood loss was minimal. Impression:               - No endoscopic esophageal abnormality to explain                            patient's dysphagia. Esophagus dilated. Dilated.                           - Multiple gastric polyps. Biopsied. One (largest)                            very friable and may have been a cause of suspected                            melena.                            - Normal examined duodenum. Recommendation:           - Patient has a contact number available for                            emergencies. The signs and symptoms of potential                            delayed complications were discussed with the                            patient. Return to normal activities tomorrow.                            Written discharge instructions were provided to the  patient.                           - Clear liquids x 1 hour then soft foods rest of                            day. Start prior diet tomorrow.                           - Continue present medications.                           - Await pathology results. Consider polypectomy                            (banding? snare? - would need to be at hospital)                           - Resume Eliquis (apixaban) and Plavix                            (clopidogrel) at prior doses tomorrow.                           - Await pathology results.                           - See the other procedure note for documentation of                            additional recommendations. Gatha Mayer, MD 12/04/2021 2:24:15 PM This report has been signed electronically.

## 2021-12-04 NOTE — Progress Notes (Signed)
[facility] History and Physical   Primary Care Physician:  Tisovec, [name], MD      Name Primary?   Esophageal dysphagia Yes   Hx of adenomatous colonic polyps     Melena     Chronic anticoagulation     PAF (paroxysmal atrial fibrillation) (HCC)     Diastasis recti     Umbilical hernia without obstruction and without gangrene     Long term current use of antithrombotics/antiplatelets      Upper GI endoscopy with potential esophageal dilation for dysphagia and question of melena and abdominal cramping   Colonoscopy for polyp surveillance and evaluation of question of melena and abdominal cramping   Educated regarding etiology and nature of diastases recti versus umbilical hernia   Hold Eliquis day before and day of colonoscopy and hold clopidogrel x 5 d prior to procedure and day of.  We will clarify this with cardiology would need to reduce the risk of bleeding from potential esophageal dilation and polypectomy and biopsies.  The patient will have a transient real but rare increased risk of stroke or potentially cardiac event while off these medications but these are necessary in order to perform his higher risk endoscopic procedures.   In the interim he should be careful and cut his food small and chew well and drink adequate liquid when swallowing food.   The typical risks and benefits as well as alternatives of endoscopic procedure(s) have been discussed and reviewed. All questions answered. The patient agrees to proceed.  71 year old white man with a history of paroxysmal atrial fibrillation, coronary artery disease with non-STEMI, treated with Eliquis and clopidogrel, who also has a history of adenomatous colon polyps.  He has previously been followed by Dr. [name] but wanted to make a change in care.  He reports problems with chronic alternating bowel habits and generalized abdominal cramping and is also developed some intermittent solid food dysphagia particularly  when eating lunch when he is eating meat.  He has done a great job losing weight through the [facility] and is eating higher protein meals and has noticed some midsternal sticking when he is eating a lunchtime sandwich with meat.  This is a new issue.  In the past couple of months he had a dark black bowel movement once or twice.  Not since.  He has some intermittent generalized abdominal cramping without clear trigger.  Colonoscopy last performed 16 Mar 2018 by Dr. [name] with tubular adenomas in the sigmoid cecum transverse and descending colon.  In 2014 he had a hepatic flexure adenoma.  In 2019 exam there were a total of 9 polyps maximum 6 mm.  A 3-year surveillance colonoscopy was planned.  He intended to return to Dr. [name] but there is a new requirement to have a credit card on file and the patient was uncomfortable and had sought care elsewhere.  GI review of systems is otherwise negative.      HPI: Vaughn Beaumier. is a 71 y.o. male   71 year old white man with a history of paroxysmal atrial fibrillation, coronary artery disease with non-STEMI, treated with Eliquis and clopidogrel, who also has a history of adenomatous colon polyps.  He has previously been followed by Dr. [name] but wanted to make a change in care.  He reports problems with chronic alternating bowel habits and generalized abdominal cramping and is also developed some intermittent solid food dysphagia particularly when eating lunch when he is eating meat.  He has done a great job losing weight through the [facility] center and is eating higher protein meals and has noticed some midsternal sticking when he is eating a lunchtime sandwich with meat.  This is a new issue.  In the past couple of months he had a dark black bowel movement once or twice.  Not since.  He has some intermittent generalized abdominal cramping without clear trigger.  Colonoscopy last performed 16 Mar 2018 by Dr. [name] with tubular adenomas in the sigmoid cecum transverse and descending colon.  In 2014 he had a hepatic flexure adenoma.  In 2019 exam there were a total of 9 polyps maximum 6 mm.  A 3-year surveillance colonoscopy was planned.  He intended to return to Dr. [name] but there is a new requirement to have a credit card on file and the patient was uncomfortable and had sought care elsewhere.  GI review of systems is otherwise negative.   Past Medical History:  Diagnosis Date   Anxiety    Atrial fibrillation (HCC)    CAD S/P percutaneous coronary angioplasty 09/13/2018    99% p-mLAD (BTW d1&d2) -> SYNERGY DES 3.5 X 12 (3.75 mm). 20% LM.  Cx, small RI & co-dom RCA normal.  EF 65%.    Congestive heart failure (CHF) ([place])    Diabetes mellitus without complication ([place])    Dizziness 2012   normal findings..   Essential hypertension 07/28/2016   GERD (gastroesophageal reflux disease)    Hyperlipidemia    Lower extremity edema 07/28/2016   NSTEMI (non-ST elevated myocardial infarction) ([place]) 09/13/2018   The patient presented 09/13/2018 with a non-ST elevation MI, troponin peak was 0.15   Obesity    Obesity (BMI 30-39.9) 07/28/2016   OSA (obstructive sleep apnea) 07/28/2016   Restless leg syndrome    mild   SCCA (squamous cell carcinoma) of skin 08/24/2021   Left Forehead (in situ-inflitrating)   Shortness of breath 07/28/2016   Sleep apnea    Squamous cell carcinoma of skin 03/20/2015   right ant scalp(curetx3, 29fu)   Squamous cell carcinoma of skin 01/01/2016   left lateral forehead (curetx3, 63fu)   Squamous cell carcinoma of skin 06/15/2019   left mid anterior scalp (tx afte bx)   Squamous cell carcinoma of skin 01/08/2021   well diff- left forehead   Squamous cell carcinoma of skin 01/08/2021   in situ- mid frontal scalp   TIA (transient ischemic attack)    Tubular adenoma     Past Surgical History:  Procedure Laterality Date   COLONOSCOPY     CORONARY BALLOON  ANGIOPLASTY N/A 12/05/2018   Procedure: CORONARY BALLOON ANGIOPLASTY;  Surgeon: [name], MD;  Location: [facility];  Service: Cardiovascular;  Laterality: N/A;   CORONARY STENT INTERVENTION N/A 09/13/2018   Procedure: CORONARY STENT INTERVENTION;  Surgeon: Belva Crome, MD;  Location: [facility];  Service: Cardiovascular;; p-m LAD (btw D1&D2) - SYNERGY DES 3.5 x 12 (3.75 mm).   KNEE ARTHROSCOPY Right    LEFT HEART CATH AND CORONARY ANGIOGRAPHY N/A 09/13/2018   Procedure: LEFT HEART CATH AND CORONARY ANGIOGRAPHY;  Surgeon: Belva Crome, MD;  Location: [facility];  Service: Cardiovascular;;  99% p-mLAD (BTW d1&d2) -> SYNERGY DES 3.5 X 12 (3.75 mm). 20% LM.  Cx, small RI & co-dom RCA normal.  EF 65%.    LEFT HEART CATH AND CORONARY ANGIOGRAPHY  2003   30-40% proximal segmental stenosis  in the LAD   LEFT HEART CATH AND CORONARY ANGIOGRAPHY  2006   EF >50% & no mitral regurgitation   LEFT HEART CATH AND CORONARY ANGIOGRAPHY N/A 12/05/2018   Procedure: LEFT HEART CATH AND CORONARY ANGIOGRAPHY;  Surgeon: [name], MD;  Location: [facility];  Service: Cardiovascular;  Laterality: N/A;   NM MYOVIEW LTD  07/2016   Normal.  EF 55-65% 63%).  No ischemia or infarction.  LOW RISK   NM MYOVIEW LTD  11/2018   6: 34 min.  7.2 METS.  Noted chest pain and tightness.  Horizontal ST of T wave depression (2 mm) II, 3, V5 and V6 (HIGH RISK), large/severe defect basal-apical inferior, inferolateral wall.  Large-severe basal-apical anterior-anterolateral wall.  Medium/moderate defect in basal and mid inferoseptal wall.  Consistent with large prior infarct with peri-infarct ischemia.  HIGH RISK   PERICARDIOCENTESIS N/A 12/05/2018   Procedure: PERICARDIOCENTESIS;  Surgeon: [name], MD;  Location: [facility];  Service: Cardiovascular;  Laterality: N/A;   SUBXYPHOID PERICARDIAL WINDOW N/A 12/25/2018   Procedure: SUBXYPHOID PERICARDIAL WINDOW;  Surgeon:  [name], MD;  Location: [facility];  Service: Thoracic;  Laterality: N/A;   TEE WITHOUT CARDIOVERSION N/A 12/25/2018   Procedure: TRANSESOPHAGEAL ECHOCARDIOGRAM (TEE);  Surgeon: [name], MD;  Location: [facility];  Service: Thoracic;  Laterality: N/A;   tendon detachment Right    arm   TRANSTHORACIC ECHOCARDIOGRAM  07/2016   EF 60-65% with mild LVH.  Mild AI.  Trivial MR and TR.   VASECTOMY      Prior to Admission medications   Medication Sig Start Date End Date Taking? Authorizing Provider  Acetaminophen (TYLENOL PO) Take 2 tablets by mouth as needed (help releive pressure in his chest).   Yes [provider]  amLODipine (NORVASC) 5 MG tablet TAKE 1 TABLET BY MOUTH EVERY DAY 06/19/21  Yes Skeet Latch, MD  atorvastatin (LIPITOR) 80 MG tablet Take 1 tablet (80 mg total) by mouth daily. 08/20/21  Yes Skeet Latch, MD  clopidogrel (PLAVIX) 75 MG tablet TAKE 1 TABLET BY MOUTH EVERY DAY 07/06/21  Yes Skeet Latch, MD  ELIQUIS 5 MG TABS tablet TAKE 1 TABLET BY MOUTH TWICE A DAY 10/26/21  Yes Lendon Colonel, NP  escitalopram (LEXAPRO) 10 MG tablet Take 10 mg by mouth daily.   Yes [provider]  losartan (COZAAR) 50 MG tablet Take 1 tablet (50 mg total) by mouth daily. 10/28/21 01/26/22 Yes Skeet Latch, MD  metoprolol succinate (TOPROL-XL) 50 MG 24 hr tablet TAKE 1 TABLET BY MOUTH EVERY DAY WITH OR IMMEDIATELY FOLLOWING A MEAL 12/04/20  Yes Skeet Latch, MD  pantoprazole (PROTONIX) 40 MG tablet TAKE ONE TABLET BY MOUTH ONE TIME DAILY. must make follow up appointment for further refills 10/07/14  Yes [name], MD  Vitamin D, Cholecalciferol, 25 MCG (1000 UT) TABS Take 5,000 Units by mouth daily.   Yes [provider]  amitriptyline (ELAVIL) 25 MG tablet Take 0.5 tablets (12.5 mg total) by mouth at bedtime as needed for sleep. 05/26/21   Laqueta Linden, MD  nitroGLYCERIN (NITROSTAT) 0.4 MG SL tablet Place 1 tablet (0.4 mg total) under  the tongue every 5 (five) minutes as needed for chest pain. Patient not taking: Reported on 12/04/2021 03/24/21   Lendon Colonel, NP    Current Outpatient Medications  Medication Sig Dispense Refill   Acetaminophen (TYLENOL PO) Take 2 tablets by mouth as needed (help releive pressure  in his chest).     amLODipine (NORVASC) 5 MG tablet TAKE 1 TABLET BY MOUTH EVERY DAY 90 tablet 3   atorvastatin (LIPITOR) 80 MG tablet Take 1 tablet (80 mg total) by mouth daily. 90 tablet 2   clopidogrel (PLAVIX) 75 MG tablet TAKE 1 TABLET BY MOUTH EVERY DAY 90 tablet 2   ELIQUIS 5 MG TABS tablet TAKE 1 TABLET BY MOUTH TWICE A DAY 60 tablet 5   escitalopram (LEXAPRO) 10 MG tablet Take 10 mg by mouth daily.     losartan (COZAAR) 50 MG tablet Take 1 tablet (50 mg total) by mouth daily. 90 tablet 1   metoprolol succinate (TOPROL-XL) 50 MG 24 hr tablet TAKE 1 TABLET BY MOUTH EVERY DAY WITH OR IMMEDIATELY FOLLOWING A MEAL 90 tablet 3   pantoprazole (PROTONIX) 40 MG tablet TAKE ONE TABLET BY MOUTH ONE TIME DAILY. must make follow up appointment for further refills 15 tablet 0   Vitamin D, Cholecalciferol, 25 MCG (1000 UT) TABS Take 5,000 Units by mouth daily.     amitriptyline (ELAVIL) 25 MG tablet Take 0.5 tablets (12.5 mg total) by mouth at bedtime as needed for sleep. 30 tablet 0   nitroGLYCERIN (NITROSTAT) 0.4 MG SL tablet Place 1 tablet (0.4 mg total) under the tongue every 5 (five) minutes as needed for chest pain. (Patient not taking: Reported on 12/04/2021) 75 tablet 3   Current Facility-Administered Medications  Medication Dose Route Frequency Provider Last Rate Last Admin   0.9 %  sodium chloride infusion  500 mL Intravenous Continuous Gatha Mayer, MD        Allergies as of 12/04/2021 - Review Complete 12/04/2021  Allergen Reaction Noted   Penicillins Hives and Nausea And Vomiting 06/27/2014    Family History  Problem Relation Age of Onset   Heart disease Mother    Hypertension Mother     Diabetes Mother    Heart failure Mother    Stroke Father    Other Father        bypass surgery   Heart disease Father    Heart failure Father    COPD Father    Heart attack Sister    Diabetes Sister    Heart failure Sister    Diabetes Brother    Pancreatic cancer Paternal Aunt    Pancreatic cancer Paternal Uncle    Other Maternal Grandfather        smoking related disease   Heart attack Paternal Grandmother    Colon cancer Neg Hx    Esophageal cancer Neg Hx    Stomach cancer Neg Hx    Rectal cancer Neg Hx     Social History   Socioeconomic History   Marital status: Married    Spouse name: Not on file   Number of children: Not on file   Years of education: Not on file   Highest education level: Not on file  Occupational History   Occupation: Retired  Tobacco Use   Smoking status: Never   Smokeless tobacco: Never  Vaping Use   Vaping Use: Never used  Substance and Sexual Activity   Alcohol use: No   Drug use: No   Sexual activity: Not on file  Other Topics Concern   Not on file  Social History Narrative   Patient is married with 2 sons and retired   Never smoker no alcohol or tobacco now and no caffeine now    Review of Systems:  All other review of systems negative  except as mentioned in the HPI.  Physical Exam: Vital signs BP (!) 124/56    Pulse 69    Temp (!) 97.3 F (36.3 C)    Ht 5\' 9"  (1.753 m)    Wt 263 lb (119.3 kg)    SpO2 96%    BMI 38.84 kg/m   General:   Alert,  Well-developed, well-nourished, pleasant and cooperative in NAD Lungs:  Clear throughout to auscultation.   Heart:  Regular rate and rhythm; no murmurs, clicks, rubs,  or gallops. Abdomen:  Soft, nontender and nondistended. Normal bowel sounds.  Obese w/ diastasis recti and umbilical hernia Neuro/Psych:  Alert and cooperative. Normal mood and affect. A and O x 3   @Celester Lech  Simonne Maffucci, MD, [facility] [phone] (pager) 12/04/2021 1:30 PM@

## 2021-12-04 NOTE — Progress Notes (Signed)
VSS, transported to PACU °

## 2021-12-04 NOTE — Op Note (Signed)
[place] Patient Name: Devon Mathews Procedure Date: 12/04/2021 1:29 PM MRN: [id number] Endoscopist: Gatha Mayer , MD Age: 71 Referring MD:  Date of Birth: Aug 31, 1951 Gender: Male Account #: [id number] Procedure:                Colonoscopy Indications:              Surveillance: Personal history of adenomatous                            polyps on last colonoscopy > 3 years ago Medicines:                Propofol per Anesthesia, Monitored Anesthesia Care Procedure:                Pre-Anesthesia Assessment:                           - Prior to the procedure, a History and Physical                            was performed, and patient medications and                            allergies were reviewed. The patient's tolerance of                            previous anesthesia was also reviewed. The risks                            and benefits of the procedure and the sedation                            options and risks were discussed with the patient.                            All questions were answered, and informed consent                            was obtained. Prior Anticoagulants: The patient                            last took Eliquis (apixaban) 2 days and Plavix                            (clopidogrel) 5 days prior to the procedure. ASA                            Grade Assessment: III - A patient with severe                            systemic disease. After reviewing the risks and                            benefits, the patient was deemed in satisfactory  condition to undergo the procedure.                           After obtaining informed consent, the colonoscope                            was passed under direct vision. Throughout the                            procedure, the patient's blood pressure, pulse, and                            oxygen saturations were monitored continuously. The                            CF [id number] #[id number]  was introduced through the anus                            and advanced to the the terminal ileum, with                            identification of the appendiceal orifice and IC                            valve. The colonoscopy was performed without                            difficulty. The patient tolerated the procedure                            well. The quality of the bowel preparation was                            excellent. The terminal ileum, ileocecal valve,                            appendiceal orifice, and rectum were photographed. Scope In: 1:51:40 PM Scope Out: 2:07:11 PM Scope Withdrawal Time: 0 hours 13 minutes 57 seconds  Total Procedure Duration: 0 hours 15 minutes 31 seconds  Findings:                 The perianal and digital rectal examinations were                            normal.                           An area of mucosa in the terminal ileum was                            erythematous. Biopsies were taken with a cold                            forceps for histology. Verification of patient  identification for the specimen was done. Estimated                            blood loss was minimal.                           Two sessile polyps were found in the descending                            colon and ascending colon. The polyps were                            diminutive in size. These polyps were removed with                            a cold snare. Resection and retrieval were                            complete. Verification of patient identification                            for the specimen was done. Estimated blood loss was                            minimal.                           The exam was otherwise without abnormality on                            direct and retroflexion views. Complications:            No immediate complications. Estimated Blood Loss:     Estimated blood loss was minimal. Impression:                - Erythematous mucosa in the terminal ileum.                            Biopsied.                           - Two diminutive polyps in the descending colon and                            in the ascending colon, removed with a cold snare.                            Resected and retrieved.                           - The examination was otherwise normal on direct                            and retroflexion views.                           -  Personal history of colonic polyps. Recommendation:           - Patient has a contact number available for                            emergencies. The signs and symptoms of potential                            delayed complications were discussed with the                            patient. Return to normal activities tomorrow.                            Written discharge instructions were provided to the                            patient.                           - Clear liquids x 1 hour then soft foods rest of                            day. Start prior diet tomorrow.                           - Resume Eliquis (apixaban) tomorrow and Plavix                            (clopidogrel) tomorrow at prior doses.                           - Await pathology results.                           - Repeat colonoscopy is recommended for                            surveillance. The colonoscopy date will be                            determined after pathology results from today's                            exam become available for review. Gatha Mayer, MD 12/04/2021 2:35:56 PM This report has been signed electronically.

## 2021-12-06 ENCOUNTER — Other Ambulatory Visit: Payer: Self-pay | Admitting: Cardiovascular Disease

## 2021-12-09 ENCOUNTER — Telehealth: Payer: Self-pay

## 2021-12-09 NOTE — Telephone Encounter (Signed)
°  Follow up Call-  Call back number 12/04/2021  Post procedure Call Back phone  # [phone]  Permission to leave phone message Yes  Some recent data might be hidden     Patient questions:  Do you have a fever, pain , or abdominal swelling? No. Pain Score  0 *  Have you tolerated food without any problems? Yes.    Have you been able to return to your normal activities? Yes.    Do you have any questions about your discharge instructions: Diet   No. Medications  No. Follow up visit  No.  Do you have questions or concerns about your Care? No.  Actions: * If pain score is 4 or above: No action needed, pain <4.

## 2021-12-18 ENCOUNTER — Other Ambulatory Visit: Payer: Self-pay

## 2021-12-18 ENCOUNTER — Telehealth: Payer: Self-pay

## 2021-12-18 ENCOUNTER — Telehealth: Payer: Self-pay | Admitting: Internal Medicine

## 2021-12-18 ENCOUNTER — Encounter: Payer: Self-pay | Admitting: Internal Medicine

## 2021-12-18 DIAGNOSIS — K921 Melena: Secondary | ICD-10-CM

## 2021-12-18 DIAGNOSIS — K317 Polyp of stomach and duodenum: Secondary | ICD-10-CM

## 2021-12-18 NOTE — Telephone Encounter (Signed)
1st attempt to reach pt regarding surgical clearance and the need for an appointment. Left a message for pt to call back to schedule with Dr. Oval [name] or 1st available APP. ?

## 2021-12-18 NOTE — Telephone Encounter (Signed)
Explain I have been delayed due to my hospital work and apologize for the delay. ? ?All biopsies and removed lesions are benign. ? ?The stomach polyps are likely not precancerous but I think they are a source of bleeding and I think this should be removed and we need to do this at the hospital.  We could set him up for my May hospital date.  He will need to hold his Eliquis and Plavix just like we did for these procedures.  We can double check with cardiology on this again. ? ?I will send out a letter about all of the other biopsies in the coming days.  This will explain when he needs another colonoscopy etc. ? ?Ask him about his swallowing trouble and is it any better please ? ? ?

## 2021-12-18 NOTE — Telephone Encounter (Signed)
Patient with diagnosis of A Fib on Eliquis for anticoagulation.  ? ?Procedure: EGD/Polypectomy ? ?Date of procedure: 03/01/22 ? ? ?CHA2DS2-VASc Score = 7  ?This indicates a 11.2% annual risk of stroke. ?The patient's score is based upon: ?CHF History: 1 ?HTN History: 1 ?Diabetes History: 1 ?Stroke History: 2 ?Vascular Disease History: 1 ?Age Score: 1 ?Gender Score: 0 ?  ?CrCl 86 mL/min using adjusted body weight ?Platelet count 186K ? ?Per office protocol, patient can hold Eliquis for 1 day prior to procedure. ?

## 2021-12-18 NOTE — Telephone Encounter (Signed)
Please see note below from pt :I do not see a letter created  or any documentation on path report:  ?Please advise  ?

## 2021-12-18 NOTE — Telephone Encounter (Signed)
Inbound call from patient requesting results from procedure done on 2/17. Please advise.  ?

## 2021-12-18 NOTE — Telephone Encounter (Signed)
Pt scheduled for an EGD/ Polypectomy on 03/01/2022 at [facility] at 10:45 with Dr. Carlean Purl: ?Pt made aware: ?Ambulatory Referral to GI Sent: ?Prep Letter Created and Sent to pt via my chart and via mail: Pt made aware ?Request for plavix and Eliquis sent to pt Cardiologist to be held for procedure through Epic: Pt made aware; ?Pt verbalized understanding with all questions answered.  ? ?

## 2021-12-18 NOTE — Telephone Encounter (Signed)
[facility] Pre-operative Risk Assessment  ?   ?[name] ?[birth year] ?[id number] ? ?Procedure: EGD/ Polypectomy ?Anesthesia type:  MAC ?Procedure Date: 03/01/2022 ?Provider: Dr. Carlean Purl ? ?Type of Clearance needed: Pharmacy ? ?Medication(s) needing held: Plavix; Eliquis  ? ?Length of time for medication to be held:  ? ?Please review request and advise by either responding to this message or by sending your response to the fax # provided below. ? ?Thank you, ? ?[facility]  ?Phone: [phone] ?Fax: [phone] ?ATTENTION: AMMIE EVERSOLE, LPN ? ?  ?

## 2021-12-21 NOTE — Telephone Encounter (Signed)
Left message x 2 for pt to call back for appt with Dr. Oval [name] or APP. Procedure is set at this time for 03/01/22.  ?

## 2021-12-22 NOTE — Telephone Encounter (Signed)
Spoke with patient and got him scheduled with Dr Oval [name] on 01/07/22 at 8:20 am. Patient agreeable with appointment.  ?

## 2022-01-04 ENCOUNTER — Ambulatory Visit (INDEPENDENT_AMBULATORY_CARE_PROVIDER_SITE_OTHER): Payer: Medicare HMO | Admitting: Physician Assistant

## 2022-01-04 ENCOUNTER — Other Ambulatory Visit: Payer: Self-pay

## 2022-01-04 ENCOUNTER — Encounter (INDEPENDENT_AMBULATORY_CARE_PROVIDER_SITE_OTHER): Payer: Self-pay | Admitting: Physician Assistant

## 2022-01-04 VITALS — BP 115/56 | HR 60 | Temp 98.3°F | Ht 69.0 in | Wt 264.0 lb

## 2022-01-04 DIAGNOSIS — Z6838 Body mass index (BMI) 38.0-38.9, adult: Secondary | ICD-10-CM

## 2022-01-04 DIAGNOSIS — E119 Type 2 diabetes mellitus without complications: Secondary | ICD-10-CM | POA: Diagnosis not present

## 2022-01-04 DIAGNOSIS — E669 Obesity, unspecified: Secondary | ICD-10-CM

## 2022-01-06 NOTE — Progress Notes (Signed)
? ? ? ?Chief Complaint:  ? ?OBESITY ?Devon Mathews is here to discuss his progress with his obesity treatment plan along with follow-up of his obesity related diagnoses. [name] is on the Category 3 Plan and states he is following his eating plan approximately 85-90% of the time. Dhaval states he is doing 0 minutes 0 times per week. ? ?Today's visit was #: 14 ?Starting weight: 284 lbs ?Starting date: 04/28/2021 ?Today's weight: 264 lbs ?Today's date: 01/04/2022 ?Total lbs lost to date: 20 lbs ?Total lbs lost since last in-office visit: 0 ? ?Interim History: Manfred continues to eat out often due to traveling a lot for work. He is trying to make good choices, eating more salads and adding extra protein. His right knee and left hip are hurting and he is going to see his orthopedist. He is not hungry throughout the day.  ? ?Subjective:  ? ?1. Type 2 diabetes mellitus without complication, without long-term current use of insulin ([place]) ?Devon Mathews's last A1C was 5.6. He is not hungry during the day. He is not on medication currently. ? ?Assessment/Plan:  ? ?1. Type 2 diabetes mellitus without complication, without long-term current use of insulin ([place]) ?Devon Mathews will continue with the plan and he will was as tolerated. Good blood sugar control is important to decrease the likelihood of diabetic complications such as nephropathy, neuropathy, limb loss, blindness, coronary artery disease, and death. Intensive lifestyle modification including diet, exercise and weight loss are the first line of treatment for diabetes.  ? ?2. Obesity with current BMI of 38.97 ?Devon Mathews is currently in the action stage of change. As such, his goal is to continue with weight loss efforts. He has agreed to keeping a food journal and adhering to recommended goals of 1500-1700 calories and 120 grams of protein daily.  ? ?Exercise goals: No exercise has been prescribed at this time. ? ?Behavioral modification strategies: meal planning and cooking strategies and keeping healthy foods  in the home. ? ?Devon Mathews has agreed to follow-up with our clinic in 3 weeks. He was informed of the importance of frequent follow-up visits to maximize his success with intensive lifestyle modifications for his multiple health conditions.  ? ?Objective:  ? ?Blood pressure (!) 115/56, pulse 60, temperature 98.3 ?F (36.8 ?C), height '5\' 9"'$  (1.753 m), weight 264 lb (119.7 kg), SpO2 97 %. ?Body mass index is 38.99 kg/m?. ? ?General: Cooperative, alert, well developed, in no acute distress. ?HEENT: Conjunctivae and lids unremarkable. ?Cardiovascular: Regular rhythm.  ?Lungs: Normal work of breathing. ?Neurologic: No focal deficits.  ? ?Lab Results  ?Component Value Date  ? CREATININE 1.03 09/21/2021  ? BUN 15 09/21/2021  ? NA 140 09/21/2021  ? K 4.9 09/21/2021  ? CL 105 09/21/2021  ? CO2 25 09/21/2021  ? ?Lab Results  ?Component Value Date  ? ALT 18 09/21/2021  ? AST 16 09/21/2021  ? ALKPHOS 79 09/21/2021  ? BILITOT 0.5 09/21/2021  ? ?Lab Results  ?Component Value Date  ? HGBA1C 5.6 09/21/2021  ? HGBA1C 6.2 04/17/2021  ? HGBA1C 6.9 (H) 03/09/2021  ? HGBA1C 5.5 12/21/2018  ? HGBA1C 5.2 09/14/2018  ? ?Lab Results  ?Component Value Date  ? INSULIN 32.1 (H) 09/21/2021  ? INSULIN 55.5 (H) 04/28/2021  ? ?Lab Results  ?Component Value Date  ? TSH 5.530 (H) 09/21/2021  ? ?Lab Results  ?Component Value Date  ? CHOL 139 04/17/2021  ? HDL 33 (A) 04/17/2021  ? Arnold 85 04/17/2021  ? TRIG 106 04/17/2021  ? CHOLHDL  3.0 12/21/2018  ? ?Lab Results  ?Component Value Date  ? VD25OH 77.0 09/21/2021  ? VD25OH 73.3 04/28/2021  ? ?Lab Results  ?Component Value Date  ? WBC 4.8 04/17/2021  ? HGB 12.8 (A) 04/17/2021  ? HCT 37 (A) 04/17/2021  ? MCV 98 (H) 06/25/2020  ? PLT 173 04/17/2021  ? ?No results found for: IRON, TIBC, FERRITIN ? ?Attestation Statements:  ? ?Reviewed by clinician on day of visit: allergies, medications, problem list, medical history, surgical history, family history, social history, and previous encounter notes. ? ?I, Tonye Pearson, am acting as Location manager for [employer], PA-C. ? ?I have reviewed the above documentation for accuracy and completeness, and I agree with the above. Abby Potash, PA-C ? ?

## 2022-01-07 ENCOUNTER — Ambulatory Visit (HOSPITAL_BASED_OUTPATIENT_CLINIC_OR_DEPARTMENT_OTHER): Payer: Medicare HMO | Admitting: Cardiovascular Disease

## 2022-01-07 ENCOUNTER — Encounter (HOSPITAL_BASED_OUTPATIENT_CLINIC_OR_DEPARTMENT_OTHER): Payer: Self-pay | Admitting: Cardiovascular Disease

## 2022-01-07 ENCOUNTER — Other Ambulatory Visit: Payer: Self-pay

## 2022-01-07 VITALS — BP 106/62 | HR 62 | Ht 69.0 in | Wt 275.9 lb

## 2022-01-07 DIAGNOSIS — I10 Essential (primary) hypertension: Secondary | ICD-10-CM | POA: Diagnosis not present

## 2022-01-07 DIAGNOSIS — I3139 Other pericardial effusion (noninflammatory): Secondary | ICD-10-CM

## 2022-01-07 DIAGNOSIS — Z9861 Coronary angioplasty status: Secondary | ICD-10-CM | POA: Diagnosis not present

## 2022-01-07 DIAGNOSIS — E785 Hyperlipidemia, unspecified: Secondary | ICD-10-CM | POA: Diagnosis not present

## 2022-01-07 DIAGNOSIS — G4733 Obstructive sleep apnea (adult) (pediatric): Secondary | ICD-10-CM

## 2022-01-07 DIAGNOSIS — I2510 Atherosclerotic heart disease of native coronary artery without angina pectoris: Secondary | ICD-10-CM | POA: Diagnosis not present

## 2022-01-07 MED ORDER — AMLODIPINE BESYLATE 2.5 MG PO TABS: 2.5000 mg | ORAL_TABLET | Freq: Every day | ORAL | 3 refills | Status: AC

## 2022-01-07 NOTE — Assessment & Plan Note (Signed)
Continue CPAP.  

## 2022-01-07 NOTE — Assessment & Plan Note (Signed)
He is doing well from a CAD standpoint.  He has no ischemia.  Encouraged him to increase his exercise.  Continue clopidogrel, metoprolol, and atorvastatin.  Lipids were above goal when last checked.  He will get fasting lipids and a CMP today.  If his lipids are still above goal we will need to add Nexlizet. ?

## 2022-01-07 NOTE — Assessment & Plan Note (Signed)
Occurred after PCI.  Required a pericarcardial window.  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  Resolved on subsequent echo.  No symptoms of recurrence. ?

## 2022-01-07 NOTE — Assessment & Plan Note (Signed)
Continue atorvastatin.  Check fasting lipids and CMP today. ?

## 2022-01-07 NOTE — Assessment & Plan Note (Signed)
No episodes of atrial fibrillation.  Continue metoprolol and Eliquis.  OK to hold one day prior to EGD. ?

## 2022-01-07 NOTE — Patient Instructions (Addendum)
Medication Instructions:  ?DECREASE YOUR AMLODIPINE TO 2.5 MG DAILY  ? ?*If you need a refill on your cardiac medications before your next appointment, please call your pharmacy* ? ?Lab Work: ?LP/CMET TODAY  ? ?Testing/Procedures: ?NONE ? ?Follow-Up: ?At [facility], you and your health needs are our priority.  As part of our continuing mission to provide you with exceptional heart care, we have created designated Provider Care Teams.  These Care Teams include your primary Cardiologist (physician) and Advanced Practice Providers (APPs -  Physician Assistants and Nurse Practitioners) who all work together to provide you with the care you need, when you need it. ? ?We recommend signing up for the patient portal called "MyChart".  Sign up information is provided on this After Visit Summary.  MyChart is used to connect with patients for Virtual Visits (Telemedicine).  Patients are able to view lab/test results, encounter notes, upcoming appointments, etc.  Non-urgent messages can be sent to your provider as well.   ?To learn more about what you can do with MyChart, go to [email or web address].   ? ?Your next appointment:   ?4 month(s) ? ?The format for your next appointment:   ?In Person ? ?Provider:   ?Skeet Latch, MD{ ? ?SPECIAL INSTRUCTIONS: ? ?MONITOR YOUR BLOOD PRESSURE AND CALL THE OFFICE IF STARTS GOING ABOVE 130/80 ? ?YOU ARE CLEARED FOR YOUR UPCOMING PROCEDURE  ?

## 2022-01-07 NOTE — Progress Notes (Addendum)
? ?Cardiology Office Note  ? ?Date:  01/07/2022  ? ?ID:  Devon Mathews., DOB 30-Dec-1950, MRN [id number] ? ?PCP:  Haywood Pao, MD  ?Cardiologist:  Skeet Latch, MD  ?Electrophysiologist:  None  ?Sleep: Dr. Claiborne Billings ? ?Evaluation Performed:  Follow-Up Visit ? ?Chief Complaint:  Follow up ? ?History of Present Illness:   ? ?Devon Mathews is a 71 y.o. male with CAD s/p NSTEMI and PCI, paroxysmal atrial fibrillation, procedural pericardial effusion/tamponade s/p pericardial window, mild AR, hypertension, hyperlipidemia, OSA and obestiy who presents for follow up.  Devon Mathews was previously seen by Dr. Rollene Fare and by Dr. Einar Gip.  He last saw Dr. Einar Gip 01/2015 and reported mild, chronic dyspnea. He underwent cardiac catheterization in 2003 and 2006 with a 40% LAD lesion noted.  He had an ETT in 2007 that was negative for ischemia and a nuclear stress in 2011 and 2012 that were negative.  He had an echo 07/02/13 that revealed LVEF 55-60% and mild mitral regurgitation. He was seen in clinic 07/2016 and reported several months of chest pain and exertional shortness of breath.  He was referred for exercise Myoview 08/13/16 that revealed LVEF 63% and no ischemia.  He also had an echo 08/16/16 with LVEF 60-65%, mild LVH, mild AR and mild TR.  Devon Mathews had a sleep study in 2014 that demonstrated moderate OSA. He continues to be compliant with CPAP.   ?  ?Devon Mathews had an NSTEMI 08/2018.  Troponin peaked at 0.15.  He underwent LHC and had a 95% mid LAD lesion that was successfully stented with a DES.  20% distal left main disease was also noted.  LVEF was 65% on left ventriculogram.  Simvastatin was switched to atorvastatin and metoprolol was increased to 50 mg daily.  Plans were made to continue dual antiplatelet therapy for 1 to 3 months followed by ticagrelor alone.  Since his last appointment Devon Mathews, [place] and reported angina.  He was referred for [facility] 12/01/2018 where there was concern for  reversible ischemia in the anterolateral region and infarct with peri-infarct ischemia in the inferolateral and inferoseptal regions.  He subsequently underwent left heart catheterization 12/05/2018 that revealed a 90% ostial D1 lesion.  He underwent successful balloon angioplasty.  However the procedure was complicated by a diagonal wire perforation with subsequent development of a pericardial effusion and tamponade.  He developed hemorrhagic pericarditis and was started on colchicine.  He was discharged but developed A worsening effusion and early tamponade was noted on his echo 12/2018.  He was admitted and underwent pericardial window.  During that time he also developed atrial fibrillation and had a TIA. His torsemide was increased because of volume overload. He followed up with Devon lawrence, DNP 02/2021 and his weight was still increasing. Torsemide was again increased. Repeat Echo 03/2021 revealed LVEF 60-65% with grade 1 diastolic dysfunction. BNP had been normal. ? ?At his last appointment he was taking torsemide once every other day. He had returned to the lower dose after developing some kidney pain when taking extra torsemide. 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 He reported typical home blood pressures in the 110s, with rare 90s or 120s. He had one episode of severe fatigue and found his BP to be in the 61P systolic. He presents today for follow-up and surgical clearance for EGD with propofol scheduled for 03/01/2022 by Dr. Carlean Purl. Today, he is feeling pretty good. He denies  any recurrent arrhythmic episodes. At home he does not monitor his blood pressure, but it was 115/59 at a previous clinic visit. He has successfully lost some weight. He notes being 264 lbs at the [facility] clinic. 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 Lately he complains of bothersome right knee or left hip pain. His right knee has also been stiff,  causing difficulty when climbing down from his truck. This has been limiting his formal exercise. He plans to lose some more weight before having surgery to repair his hernia. When he coughs hard, he has noticed the presence of another possible hernia. He also notes his cough sounds like bronchitis. About a month ago he was infected with COVID. He was feeling better aside from the cough after taking the full course of his treatment. He endorses compliance with his current regimen as well. He denies any palpitations, chest pain, or shortness of breath. No lightheadedness, headaches, syncope, orthopnea, or PND. ? ? ?Past Medical History:  ?Diagnosis Date  ? Anxiety   ? Atrial fibrillation ([place])   ? CAD S/P percutaneous coronary angioplasty 09/13/2018  ?  99% p-mLAD (BTW d1&d2) -> SYNERGY DES 3.5 X 12 (3.75 mm). 20% LM.  Cx, small RI & co-dom RCA normal.  EF 65%.   ? Congestive heart failure (CHF) ([place])   ? Diabetes mellitus without complication ([place])   ? Dizziness 2012  ? normal findings..  ? Essential hypertension 07/28/2016  ? GERD (gastroesophageal reflux disease)   ? Hyperlipidemia   ? Lower extremity edema 07/28/2016  ? NSTEMI (non-ST elevated myocardial infarction) ([place]) 09/13/2018  ? The patient presented 09/13/2018 with a non-ST elevation MI, troponin peak was 0.15  ? Obesity   ? Obesity (BMI 30-39.9) 07/28/2016  ? OSA (obstructive sleep apnea) 07/28/2016  ? Restless leg syndrome   ? mild  ? SCCA (squamous cell carcinoma) of skin 08/24/2021  ? Left Forehead (in situ-inflitrating)  ? Shortness of breath 07/28/2016  ? Sleep apnea   ? Squamous cell carcinoma of skin 03/20/2015  ? right ant scalp(curetx3, 88f)  ? Squamous cell carcinoma of skin 01/01/2016  ? left lateral forehead (curetx3, 522f  ? Squamous cell carcinoma of skin 06/15/2019  ? left mid anterior scalp (tx afte bx)  ? Squamous cell carcinoma of skin 01/08/2021  ? well diff- left forehead  ? Squamous cell carcinoma of skin 01/08/2021  ? in situ- mid  frontal scalp  ? TIA (transient ischemic attack)   ? Tubular adenoma   ? ?Past Surgical History:  ?Procedure Laterality Date  ? COLONOSCOPY    ? CORONARY BALLOON ANGIOPLASTY N/A 12/05/2018  ? Procedure: CORONARY BALLOON ANGIOPLASTY;  Surgeon: HaLeonie ManMD;  [facility];  Service: Cardiovascular;  Laterality: N/A;  ? CORONARY STENT INTERVENTION N/A 09/13/2018  ? Procedure: CORONARY STENT INTERVENTION;  Surgeon: SmBelva CromeMD;  [facility];  Service: Cardiovascular;; p-m LAD (btw D1&D2) - SYNERGY DES 3.5 x 12 (3.75 mm).  ? KNEE ARTHROSCOPY Right   ? LEFT HEART CATH AND CORONARY ANGIOGRAPHY N/A 09/13/2018  ? Procedure: LEFT HEART CATH AND CORONARY ANGIOGRAPHY;  Surgeon: SmBelva CromeMD;  [facility];  Service: Cardiovascular;;  99% p-mLAD (BTW d1&d2) -> SYNERGY DES 3.5 X 12 (3.75 mm). 20% LM.  Cx, small RI & co-dom RCA normal.  EF 65%.   ? LEFT HEART CATH AND CORONARY ANGIOGRAPHY  2003  ?  30-40% proximal segmental stenosis in the LAD  ? LEFT HEART CATH AND CORONARY ANGIOGRAPHY  2006  ? EF >50% & no mitral regurgitation  ? LEFT HEART CATH AND CORONARY ANGIOGRAPHY N/A 12/05/2018  ? Procedure: LEFT HEART CATH AND CORONARY ANGIOGRAPHY;  Surgeon: [name], MD;  Location: [place]  [facility];  Service: Cardiovascular;  Laterality: N/A;  ? NM MYOVIEW LTD  07/2016  ? Normal.  EF 55-65% 63%).  No ischemia or infarction.  LOW RISK  ? NM MYOVIEW LTD  11/2018  ? 6: 34 min.  7.2 METS.  Noted chest pain and tightness.  Horizontal ST of T wave depression (2 mm) II, 3, V5 and V6 (HIGH RISK), large/severe defect basal-apical inferior, inferolateral wall.  Large-severe basal-apical anterior-anterolateral wall.  Medium/moderate defect in basal and mid inferoseptal wall.  Consistent with large prior infarct with peri-infarct ischemia.  HIGH RISK  ? PERICARDIOCENTESIS N/A 12/05/2018  ? Procedure: PERICARDIOCENTESIS;  Surgeon: [name], MD;  Location: [place] [facility];  Service: Cardiovascular;  Laterality: N/A;  ? SUBXYPHOID PERICARDIAL WINDOW N/A 12/25/2018  ? Procedure: SUBXYPHOID PERICARDIAL WINDOW;  Surgeon: [name], MD;  Location: [place];  Service: Terie Purser

## 2022-01-08 LAB — COMPREHENSIVE METABOLIC PANEL
ALT: 16 IU/L (ref 0–44)
AST: 15 IU/L (ref 0–40)
Albumin/Globulin Ratio: 1.8 (ref 1.2–2.2)
Albumin: 4 g/dL (ref 3.8–4.8)
Alkaline Phosphatase: 72 IU/L (ref 44–121)
BUN/Creatinine Ratio: 14 (ref 10–24)
BUN: 15 mg/dL (ref 8–27)
Bilirubin Total: 0.3 mg/dL (ref 0.0–1.2)
CO2: 23 mmol/L (ref 20–29)
Calcium: 8.9 mg/dL (ref 8.6–10.2)
Chloride: 107 mmol/L — ABNORMAL HIGH (ref 96–106)
Creatinine, Ser: 1.11 mg/dL (ref 0.76–1.27)
Globulin, Total: 2.2 g/dL (ref 1.5–4.5)
Glucose: 105 mg/dL — ABNORMAL HIGH (ref 70–99)
Potassium: 4.5 mmol/L (ref 3.5–5.2)
Sodium: 140 mmol/L (ref 134–144)
Total Protein: 6.2 g/dL (ref 6.0–8.5)
eGFR: 72 mL/min/{1.73_m2} (ref >59)

## 2022-01-08 LAB — LIPID PANEL
Chol/HDL Ratio: 2.5 ratio (ref 0.0–5.0)
Cholesterol, Total: 87 mg/dL — ABNORMAL LOW (ref 100–199)
HDL: 35 mg/dL — ABNORMAL LOW (ref >39)
LDL Chol Calc (NIH): 39 mg/dL (ref 0–99)
Triglycerides: 52 mg/dL (ref 0–149)
VLDL Cholesterol Cal: 13 mg/dL (ref 5–40)

## 2022-01-14 ENCOUNTER — Telehealth (HOSPITAL_BASED_OUTPATIENT_CLINIC_OR_DEPARTMENT_OTHER): Payer: Self-pay | Admitting: *Deleted

## 2022-01-14 NOTE — Telephone Encounter (Signed)
Devon Mayer, MD  Skeet Latch, MD; Earvin Hansen, LPN ?Dr. Oval [name],  ? ?He needs gastric polyps removed and these can bleed quite a bit and I would also prefer to hold his clopidogrel 5 days prior along with the Eliquis 1 day prior.  ? ?Please let me know your thoughts.  ? ?[name]   ?  ?   ?Will forward to Dr Oval [name] for review  ?

## 2022-01-20 DIAGNOSIS — M25561 Pain in right knee: Secondary | ICD-10-CM | POA: Diagnosis not present

## 2022-01-20 DIAGNOSIS — M1711 Unilateral primary osteoarthritis, right knee: Secondary | ICD-10-CM | POA: Diagnosis not present

## 2022-01-21 ENCOUNTER — Encounter (HOSPITAL_BASED_OUTPATIENT_CLINIC_OR_DEPARTMENT_OTHER): Payer: Self-pay | Admitting: *Deleted

## 2022-01-21 NOTE — Telephone Encounter (Addendum)
Discussed with Dr Oval [name] and ok to  hold Eliquis x 1 day and Clopidogrel for 5 days prior to procedure as requested  ? ?Will forward to Dr Carlean Purl so he will be aware.  ? ?Mychart message sent to patient  ?

## 2022-01-26 ENCOUNTER — Ambulatory Visit (INDEPENDENT_AMBULATORY_CARE_PROVIDER_SITE_OTHER): Payer: Medicare HMO | Admitting: Physician Assistant

## 2022-01-26 ENCOUNTER — Encounter (INDEPENDENT_AMBULATORY_CARE_PROVIDER_SITE_OTHER): Payer: Self-pay | Admitting: Physician Assistant

## 2022-01-26 VITALS — BP 118/64 | HR 59 | Temp 97.6°F | Ht 69.0 in | Wt 257.0 lb

## 2022-01-26 DIAGNOSIS — E669 Obesity, unspecified: Secondary | ICD-10-CM | POA: Diagnosis not present

## 2022-01-26 DIAGNOSIS — Z6837 Body mass index (BMI) 37.0-37.9, adult: Secondary | ICD-10-CM | POA: Diagnosis not present

## 2022-01-26 DIAGNOSIS — E119 Type 2 diabetes mellitus without complications: Secondary | ICD-10-CM

## 2022-01-26 NOTE — Progress Notes (Signed)
Patient aware to hold his Eliquis the day before and day of his EGD in May per cardiology. ?

## 2022-01-28 NOTE — Progress Notes (Signed)
? ? ? ?Chief Complaint:  ? ?OBESITY ?Devon Mathews is here to discuss his progress with his obesity treatment plan along with follow-up of his obesity related diagnoses. Devon Mathews is on the Category 3 Plan and keeping a food journal and adhering to recommended goals of 1500-1700 calories and 120 grams of protein and states he is following his eating plan approximately 90% of the time. Devon Mathews states he is doing 0 minutes 0 times per week. ? ?Today's visit was #: 15 ?Starting weight: 284 lbs ?Starting date: 04/28/2021 ?Today's weight: 257 lbs ?Today's date: 01/26/2022 ?Total lbs lost to date: 27 lbs ?Total lbs lost since last in-office visit: 7 lbs ? ?Interim History: Devon Mathews started back on his fluid pill, torsemide, which helped decrease swelling in his BLE. He is not drinking sweet tea and has decreased his diet drinks. He is eating salads. He has increased his protein. He got a steroid injection in his knee and feels that he can begin to walk for exercise.  ? ?Subjective:  ? ?1. Type 2 diabetes mellitus without complication, without long-term current use of insulin ([place]) ?Devon Mathews is currently not on medications. His last A1C was 5.6. He denies polyphagia. He notes no hypoglycemia.  ? ?Assessment/Plan:  ? ?1. Type 2 diabetes mellitus without complication, without long-term current use of insulin ([place]) ?Devon Mathews will continue with plan. He will start walking for exercise. Good blood sugar control is important to decrease the likelihood of diabetic complications such as nephropathy, neuropathy, limb loss, blindness, coronary artery disease, and death. Intensive lifestyle modification including diet, exercise and weight loss are the first line of treatment for diabetes.  ? ?2. Obesity with current BMI of 37.93 ?Devon Mathews is currently in the action stage of change. As such, his goal is to continue with weight loss efforts. He has agreed to keeping a food journal and adhering to recommended goals of 1500-1700 calories and 120 grams of protein.  ? ?Exercise  goals:  Devon Mathews is having knee problems.  ? ?Behavioral modification strategies: meal planning and cooking strategies and keeping healthy foods in the home. ? ?Devon Mathews has agreed to follow-up with our clinic in 4 weeks. He was informed of the importance of frequent follow-up visits to maximize his success with intensive lifestyle modifications for his multiple health conditions.  ? ?Objective:  ? ?Blood pressure 118/64, pulse (!) 59, temperature 97.6 ?F (36.4 ?C), height '5\' 9"'$  (1.753 m), weight 257 lb (116.6 kg), SpO2 97 %. ?Body mass index is 37.95 kg/m?. ? ?General: Cooperative, alert, well developed, in no acute distress. ?HEENT: Conjunctivae and lids unremarkable. ?Cardiovascular: Regular rhythm.  ?Lungs: Normal work of breathing. ?Neurologic: No focal deficits.  ? ?Lab Results  ?Component Value Date  ? CREATININE 1.11 01/07/2022  ? BUN 15 01/07/2022  ? NA 140 01/07/2022  ? K 4.5 01/07/2022  ? CL 107 (H) 01/07/2022  ? CO2 23 01/07/2022  ? ?Lab Results  ?Component Value Date  ? ALT 16 01/07/2022  ? AST 15 01/07/2022  ? ALKPHOS 72 01/07/2022  ? BILITOT 0.3 01/07/2022  ? ?Lab Results  ?Component Value Date  ? HGBA1C 5.6 09/21/2021  ? HGBA1C 6.2 04/17/2021  ? HGBA1C 6.9 (H) 03/09/2021  ? HGBA1C 5.5 12/21/2018  ? HGBA1C 5.2 09/14/2018  ? ?Lab Results  ?Component Value Date  ? INSULIN 32.1 (H) 09/21/2021  ? INSULIN 55.5 (H) 04/28/2021  ? ?Lab Results  ?Component Value Date  ? TSH 5.530 (H) 09/21/2021  ? ?Lab Results  ?Component Value Date  ?  CHOL 87 (L) 01/07/2022  ? HDL 35 (L) 01/07/2022  ? LDLCALC 39 01/07/2022  ? TRIG 52 01/07/2022  ? CHOLHDL 2.5 01/07/2022  ? ?Lab Results  ?Component Value Date  ? VD25OH 77.0 09/21/2021  ? VD25OH 73.3 04/28/2021  ? ?Lab Results  ?Component Value Date  ? WBC 4.8 04/17/2021  ? HGB 12.8 (A) 04/17/2021  ? HCT 37 (A) 04/17/2021  ? MCV 98 (H) 06/25/2020  ? PLT 173 04/17/2021  ? ?No results found for: IRON, TIBC, FERRITIN ? ?Obesity Behavioral Intervention:  ? ?Approximately 15 minutes were  spent on the discussion below. ? ?ASK: ?We discussed the diagnosis of obesity with Devon Mathews today and Devon Mathews agreed to give [place] permission to discuss obesity behavioral modification therapy today. ? ?ASSESS: ?Devon Mathews has the diagnosis of obesity and his BMI today is 38.0. Devon Mathews is in the action stage of change.  ? ?ADVISE: ?Devon Mathews was educated on the multiple health risks of obesity as well as the benefit of weight loss to improve his health. He was advised of the need for long term treatment and the importance of lifestyle modifications to improve his current health and to decrease his risk of future health problems. ? ?AGREE: ?Multiple dietary modification options and treatment options were discussed and Devon Mathews agreed to follow the recommendations documented in the above note. ? ?ARRANGE: ?Devon Mathews was educated on the importance of frequent visits to treat obesity as outlined per CMS and USPSTF guidelines and agreed to schedule his next follow up appointment today. ? ?Attestation Statements:  ? ?Reviewed by clinician on day of visit: allergies, medications, problem list, medical history, surgical history, family history, social history, and previous encounter notes. ? ?I, Tonye Pearson, am acting as Location manager for [employer], PA-C. ? ?I have reviewed the above documentation for accuracy and completeness, and I agree with the above. Abby Potash, PA-C ? ?

## 2022-02-22 ENCOUNTER — Encounter (HOSPITAL_COMMUNITY): Payer: Self-pay | Admitting: Internal Medicine

## 2022-02-25 NOTE — Progress Notes (Addendum)
TeleHealth Visit:  This visit was completed with telemedicine (audio/video) technology. Devon Mathews has verbally consented to this TeleHealth visit. The patient is located at home, the provider is located at home. The participants in this visit include the listed provider and patient. The visit was conducted today via MyChart video.  OBESITY Devon Mathews is here to discuss his progress with his obesity treatment plan along with follow-up of his obesity related diagnoses.   Today's visit was # 16 Starting weight: 284 lbs Starting date: 04/28/2021 Weight at last in office visit: 257 lbs on 01/26/22 Total weight loss: 27 lbs at last in office visit on 01/26/22. Today's reported weight: 265 lbs   Nutrition Plan: keeping a food journal and adhering to recommended goals of 1500-1700 calories and 120 gms protein. 85%of time. Has been following cat 3 vs journaling.  Hunger is well controlled. Cravings are well controlled.  Current exercise: none  Interim History: Devon Mathews is working consistently now and through the month of June.  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  When at home he adheres to category 3.   He denies intake of sugar sweetened beverages.  He is frustrated with lack of weight loss and knows he needs to adhere to the plan better.  He does work on getting protein at all meals.  He reports he is not much of a snacker.  Assessment/Plan:  1. Type II Diabetes HgbA1c is at goal. CBGs:  Not checking Episodes of hypoglycemia? no Medication(s):  no medication  Lab Results  Component Value Date   HGBA1C 5.6 09/21/2021   HGBA1C 6.2 04/17/2021   HGBA1C 6.9 (H) 03/09/2021   Lab Results  Component Value Date   MICROALBUR 5.0 04/17/2021   LDLCALC 39 01/07/2022   CREATININE 1.11 01/07/2022    Plan: Continue to work on increasing protein and decreasing carbohydrate intake.  2. CHF Has history of CAD with  NSTEMI . Managed by Dr. Skeet Latch.  He is on torsemide as needed.  Reports he takes it if he gains greater than 3 pounds in 1 day or 5 pounds in 1 week.  Plan: Continue all medications per cardiology.  Follow-up with Dr. Oval [name] as directed.  Next appointment is May 18, 2022.  3. Obesity: Current BMI 37.93 Devon Mathews is currently in the action stage of change. As such, his goal is to continue with weight loss efforts.  He has agreed to the Category 3 Plan and keeping a food journal and adhering to recommended goals of 1500-1700 calories and 120 gms protein.   Exercise goals: Older adults should follow the adult guidelines. When older adults cannot meet the adult guidelines, they should be as physically active as their abilities and conditions will allow.   Behavioral modification strategies: increasing lean protein intake, decreasing simple carbohydrates, and keeping a strict food journal.  Devon Mathews has agreed to follow-up with our clinic in 3 weeks.   No orders of the defined types were placed in this encounter.   Medications Discontinued During This Encounter  Medication Reason   furosemide (LASIX) 20 MG tablet      No orders of the defined types were placed in this encounter.     Objective:   VITALS: Per patient if applicable, see vitals. GENERAL: Alert and in no acute distress. CARDIOPULMONARY: No increased WOB. Speaking in clear sentences.  PSYCH: Pleasant and cooperative. Speech normal  rate and rhythm. Affect is appropriate. Insight and judgement are appropriate. Attention is focused, linear, and appropriate.  NEURO: Oriented as arrived to appointment on time with no prompting.   Lab Results  Component Value Date   CREATININE 1.11 01/07/2022   BUN 15 01/07/2022   NA 140 01/07/2022   K 4.5 01/07/2022   CL 107 (H) 01/07/2022   CO2 23 01/07/2022   Lab Results  Component Value Date   ALT 16 01/07/2022   AST 15 01/07/2022   ALKPHOS 72 01/07/2022   BILITOT 0.3 01/07/2022    Lab Results  Component Value Date   HGBA1C 5.6 09/21/2021   HGBA1C 6.2 04/17/2021   HGBA1C 6.9 (H) 03/09/2021   HGBA1C 5.5 12/21/2018   HGBA1C 5.2 09/14/2018   Lab Results  Component Value Date   INSULIN 32.1 (H) 09/21/2021   INSULIN 55.5 (H) 04/28/2021   Lab Results  Component Value Date   TSH 5.530 (H) 09/21/2021   Lab Results  Component Value Date   CHOL 87 (L) 01/07/2022   HDL 35 (L) 01/07/2022   LDLCALC 39 01/07/2022   TRIG 52 01/07/2022   CHOLHDL 2.5 01/07/2022   Lab Results  Component Value Date   WBC 4.8 04/17/2021   HGB 12.8 (A) 04/17/2021   HCT 37 (A) 04/17/2021   MCV 98 (H) 06/25/2020   PLT 173 04/17/2021   No results found for: IRON, TIBC, FERRITIN Lab Results  Component Value Date   VD25OH 77.0 09/21/2021   VD25OH 73.3 04/28/2021    Attestation Statements:   Reviewed by clinician on day of visit: allergies, medications, problem list, medical history, surgical history, family history, social history, and previous encounter notes.  Time spent on visit including pre-visit chart review and post-visit charting and care was 32 minutes.

## 2022-02-26 ENCOUNTER — Telehealth: Payer: Self-pay | Admitting: Internal Medicine

## 2022-02-26 NOTE — Telephone Encounter (Signed)
Patient called said he has a hospital procedure scheduled for Monday and he does not have his prep medication. ?

## 2022-02-26 NOTE — Telephone Encounter (Signed)
Pt questioned if he had to buy prep prior to his procedure on Monday: Chart Reviewed: Pt was noted to have an EGD on that date. Pt made aware that a mychart letter was sent to him on march 3 with all the prep instructions. Pt stated that he will look at the letter and call us back with any questions: ?Pt verbalized understanding with all questions answered.  ? ?

## 2022-03-01 ENCOUNTER — Ambulatory Visit (INDEPENDENT_AMBULATORY_CARE_PROVIDER_SITE_OTHER): Payer: Medicare HMO | Admitting: Physician Assistant

## 2022-03-01 ENCOUNTER — Encounter (INDEPENDENT_AMBULATORY_CARE_PROVIDER_SITE_OTHER): Payer: Self-pay | Admitting: Family Medicine

## 2022-03-01 ENCOUNTER — Ambulatory Visit (HOSPITAL_BASED_OUTPATIENT_CLINIC_OR_DEPARTMENT_OTHER): Payer: Medicare HMO | Admitting: Certified Registered"

## 2022-03-01 ENCOUNTER — Telehealth (INDEPENDENT_AMBULATORY_CARE_PROVIDER_SITE_OTHER): Payer: Medicare HMO | Admitting: Family Medicine

## 2022-03-01 ENCOUNTER — Encounter (INDEPENDENT_AMBULATORY_CARE_PROVIDER_SITE_OTHER): Payer: Self-pay

## 2022-03-01 ENCOUNTER — Encounter (HOSPITAL_COMMUNITY): Payer: Self-pay | Admitting: Internal Medicine

## 2022-03-01 ENCOUNTER — Ambulatory Visit (HOSPITAL_COMMUNITY): Payer: Medicare HMO | Admitting: Certified Registered"

## 2022-03-01 ENCOUNTER — Encounter (HOSPITAL_COMMUNITY)
Admission: RE | Disposition: A | Discharge status: Home / Self Care | Payer: Self-pay | Source: Home / Self Care | Attending: Internal Medicine

## 2022-03-01 ENCOUNTER — Ambulatory Visit (HOSPITAL_COMMUNITY)
Admission: RE | Admit: 2022-03-01 | Discharge: 2022-03-01 | Disposition: A | Discharge status: Home / Self Care | Payer: Medicare HMO | Attending: Internal Medicine | Admitting: Internal Medicine

## 2022-03-01 ENCOUNTER — Other Ambulatory Visit: Payer: Self-pay

## 2022-03-01 DIAGNOSIS — I509 Heart failure, unspecified: Secondary | ICD-10-CM

## 2022-03-01 DIAGNOSIS — Z823 Family history of stroke: Secondary | ICD-10-CM | POA: Insufficient documentation

## 2022-03-01 DIAGNOSIS — I480 Paroxysmal atrial fibrillation: Secondary | ICD-10-CM | POA: Insufficient documentation

## 2022-03-01 DIAGNOSIS — I252 Old myocardial infarction: Secondary | ICD-10-CM | POA: Diagnosis not present

## 2022-03-01 DIAGNOSIS — K219 Gastro-esophageal reflux disease without esophagitis: Secondary | ICD-10-CM | POA: Insufficient documentation

## 2022-03-01 DIAGNOSIS — K921 Melena: Secondary | ICD-10-CM | POA: Diagnosis present

## 2022-03-01 DIAGNOSIS — E119 Type 2 diabetes mellitus without complications: Secondary | ICD-10-CM

## 2022-03-01 DIAGNOSIS — Z6837 Body mass index (BMI) 37.0-37.9, adult: Secondary | ICD-10-CM

## 2022-03-01 DIAGNOSIS — I110 Hypertensive heart disease with heart failure: Secondary | ICD-10-CM | POA: Insufficient documentation

## 2022-03-01 DIAGNOSIS — E669 Obesity, unspecified: Secondary | ICD-10-CM | POA: Diagnosis not present

## 2022-03-01 DIAGNOSIS — Z6839 Body mass index (BMI) 39.0-39.9, adult: Secondary | ICD-10-CM | POA: Insufficient documentation

## 2022-03-01 DIAGNOSIS — I2510 Atherosclerotic heart disease of native coronary artery without angina pectoris: Secondary | ICD-10-CM

## 2022-03-01 DIAGNOSIS — Z8673 Personal history of transient ischemic attack (TIA), and cerebral infarction without residual deficits: Secondary | ICD-10-CM | POA: Diagnosis not present

## 2022-03-01 DIAGNOSIS — I10 Essential (primary) hypertension: Secondary | ICD-10-CM

## 2022-03-01 DIAGNOSIS — G4733 Obstructive sleep apnea (adult) (pediatric): Secondary | ICD-10-CM | POA: Insufficient documentation

## 2022-03-01 DIAGNOSIS — F419 Anxiety disorder, unspecified: Secondary | ICD-10-CM | POA: Insufficient documentation

## 2022-03-01 DIAGNOSIS — R69 Illness, unspecified: Secondary | ICD-10-CM | POA: Diagnosis not present

## 2022-03-01 DIAGNOSIS — Z8249 Family history of ischemic heart disease and other diseases of the circulatory system: Secondary | ICD-10-CM | POA: Insufficient documentation

## 2022-03-01 DIAGNOSIS — Z833 Family history of diabetes mellitus: Secondary | ICD-10-CM | POA: Insufficient documentation

## 2022-03-01 DIAGNOSIS — Z955 Presence of coronary angioplasty implant and graft: Secondary | ICD-10-CM | POA: Insufficient documentation

## 2022-03-01 DIAGNOSIS — K317 Polyp of stomach and duodenum: Secondary | ICD-10-CM | POA: Diagnosis not present

## 2022-03-01 DIAGNOSIS — I4891 Unspecified atrial fibrillation: Secondary | ICD-10-CM | POA: Diagnosis not present

## 2022-03-01 HISTORY — PX: ESOPHAGOGASTRODUODENOSCOPY (EGD) WITH PROPOFOL: SHX5813

## 2022-03-01 HISTORY — PX: GASTRIC VARICES BANDING: SHX5519

## 2022-03-01 SURGERY — ESOPHAGOGASTRODUODENOSCOPY (EGD) WITH PROPOFOL
Anesthesia: Monitor Anesthesia Care

## 2022-03-01 MED ORDER — PROPOFOL 500 MG/50ML IV EMUL
INTRAVENOUS | Status: AC
  Filled 2022-03-01: qty 50

## 2022-03-01 MED ORDER — LACTATED RINGERS IV SOLN
INTRAVENOUS | Status: DC
  Administered 2022-03-01: 1000 mL via INTRAVENOUS

## 2022-03-01 MED ORDER — PROPOFOL 1000 MG/100ML IV EMUL
INTRAVENOUS | Status: AC
  Filled 2022-03-01: qty 100

## 2022-03-01 MED ORDER — PROPOFOL 500 MG/50ML IV EMUL
INTRAVENOUS | Status: DC | PRN
  Administered 2022-03-01: 135 ug/kg/min via INTRAVENOUS

## 2022-03-01 MED ORDER — PROPOFOL 10 MG/ML IV BOLUS
INTRAVENOUS | Status: DC | PRN
  Administered 2022-03-01: 30 mg via INTRAVENOUS
  Administered 2022-03-01: 20 mg via INTRAVENOUS
  Administered 2022-03-01: 10 mg via INTRAVENOUS

## 2022-03-01 SURGICAL SUPPLY — 15 items

## 2022-03-01 NOTE — H&P (Signed)
?[facility] History and Physical ? ? ?Primary Care Physician:  Haywood Pao, MD ? ? ?Reason for Procedure:   Gastric polyp(s) ? ?Plan:    EGD and polypectomy ? ? ? ? ?HPI: Devon Mathews. is a 72 y.o. male with a history of paroxysmal atrial fibrillation, coronary artery disease with non-STEMI, treated with Eliquis and clopidogrel. ? ?Recently seen for melena and dysphagia and hx colon polyps and had procedures and now returns for gastric polypectomy. ? ?EGD 12/04/2021 ?No endoscopic esophageal abnormality to explain patient's dysphagia. Esophagus dilated. ? ?- Multiple gastric polyps. Biopsied. One (largest) very friable and may have been a cause of ?suspected melena. ?- Normal examined duodenum. ? ?COLONOSCOPY 12/04/2021 ?- Erythematous mucosa in the terminal ileum. Biopsied. ?- Two diminutive polyps in the descending colon and in the ascending colon, removed with a ?cold snare. Resected and retrieved. ?- The examination was otherwise normal on direct and retroflexion views. ?- Personal history of colonic polyps. ? ?1. Surgical [P], gastric body (larger polyp), polyp (1) ?- GASTRIC HYPERPLASTIC POLYP WITH EROSION ?- NEGATIVE FOR INTESTINAL METAPLASIA OR DYSPLASIA ?2. Surgical [P], gastric antrum (smaller polyps) ?- BENIGN ANTRAL MUCOSA WITH MILD HYPERPLASTIC CHANGES ?- NEGATIVE FOR INTESTINAL METAPLASIA OR DYSPLASIA ?3. Surgical [P], gastric body polyps ?- FUNDIC GLAND POLYP(S) ?- NEGATIVE FOR DYSPLASIA ?4. Surgical [P], colon, terminal ileum ?- MILDLY ACTIVE CHRONIC NONSPECIFIC ILEITIS. SEE NOTE ?- NEGATIVE FOR GRANULOMAS OR DYSPLASIA ?5. Surgical [P], colon, descending and ascending, polyp (2) ?- TUBULAR ADENOMA(S) ?- NEGATIVE FOR HIGH-GRADE DYSPLASIA OR MALIGNANCY ? ? ?Past Medical History:  ?Diagnosis Date  ? Anxiety   ? Atrial fibrillation ([place])   ? CAD S/P percutaneous coronary angioplasty 09/13/2018  ?  99% p-mLAD (BTW d1&d2) -> SYNERGY DES 3.5 X 12 (3.75 mm). 20% LM.  Cx, small RI &  co-dom RCA normal.  EF 65%.   ? Congestive heart failure (CHF) ([place])   ? Diabetes mellitus without complication ([place])   ? Dizziness 2012  ? normal findings..  ? Essential hypertension 07/28/2016  ? GERD (gastroesophageal reflux disease)   ? Hyperlipidemia   ? Lower extremity edema 07/28/2016  ? NSTEMI (non-ST elevated myocardial infarction) ([place]) 09/13/2018  ? The patient presented 09/13/2018 with a non-ST elevation MI, troponin peak was 0.15  ? Obesity   ? Obesity (BMI 30-39.9) 07/28/2016  ? OSA (obstructive sleep apnea) 07/28/2016  ? Restless leg syndrome   ? mild  ? SCCA (squamous cell carcinoma) of skin 08/24/2021  ? Left Forehead (in situ-inflitrating)  ? Shortness of breath 07/28/2016  ? Sleep apnea   ? Squamous cell carcinoma of skin 03/20/2015  ? right ant scalp(curetx3, 21f)  ? Squamous cell carcinoma of skin 01/01/2016  ? left lateral forehead (curetx3, 522f  ? Squamous cell carcinoma of skin 06/15/2019  ? left mid anterior scalp (tx afte bx)  ? Squamous cell carcinoma of skin 01/08/2021  ? well diff- left forehead  ? Squamous cell carcinoma of skin 01/08/2021  ? in situ- mid frontal scalp  ? TIA (transient ischemic attack)   ? Tubular adenoma   ? ? ?Past Surgical History:  ?Procedure Laterality Date  ? COLONOSCOPY    ? CORONARY BALLOON ANGIOPLASTY N/A 12/05/2018  ? Procedure: CORONARY BALLOON ANGIOPLASTY;  Surgeon: HaLeonie ManMD;  [facility];  Service: Cardiovascular;  Laterality: N/A;  ? CORONARY STENT INTERVENTION N/A 09/13/2018  ? Procedure: CORONARY STENT INTERVENTION;  Surgeon: SmBelva CromeMD;  [facility];  Service: Cardiovascular;; p-m LAD (btw D1&D2) - SYNERGY DES 3.5 x 12 (3.75 mm).  ? KNEE ARTHROSCOPY Right   ? LEFT HEART CATH AND CORONARY ANGIOGRAPHY N/A 09/13/2018  ? Procedure: LEFT HEART CATH AND CORONARY ANGIOGRAPHY;  Surgeon: Belva Crome, MD;  Location: [facility];  Service: Cardiovascular;;  99% p-mLAD (BTW d1&d2) -> SYNERGY DES 3.5  X 12 (3.75 mm). 20% LM.  Cx, small RI & co-dom RCA normal.  EF 65%.   ? LEFT HEART CATH AND CORONARY ANGIOGRAPHY  2003  ? 30-40% proximal segmental stenosis in the LAD  ? LEFT HEART CATH AND CORONARY ANGIOGRAPHY  2006  ? EF >50% & no mitral regurgitation  ? LEFT HEART CATH AND CORONARY ANGIOGRAPHY N/A 12/05/2018  ? Procedure: LEFT HEART CATH AND CORONARY ANGIOGRAPHY;  Surgeon: [name], MD;  Location: [facility];  Service: Cardiovascular;  Laterality: N/A;  ? NM MYOVIEW LTD  07/2016  ? Normal.  EF 55-65% 63%).  No ischemia or infarction.  LOW RISK  ? NM MYOVIEW LTD  11/2018  ? 6: 34 min.  7.2 METS.  Noted chest pain and tightness.  Horizontal ST of T wave depression (2 mm) II, 3, V5 and V6 (HIGH RISK), large/severe defect basal-apical inferior, inferolateral wall.  Large-severe basal-apical anterior-anterolateral wall.  Medium/moderate defect in basal and mid inferoseptal wall.  Consistent with large prior infarct with peri-infarct ischemia.  HIGH RISK  ? PERICARDIOCENTESIS N/A 12/05/2018  ? Procedure: PERICARDIOCENTESIS;  Surgeon: [name], MD;  Location: [facility];  Service: Cardiovascular;  Laterality: N/A;  ? SUBXYPHOID PERICARDIAL WINDOW N/A 12/25/2018  ? Procedure: SUBXYPHOID PERICARDIAL WINDOW;  Surgeon: [name], MD;  Location: [facility];  Service: Thoracic;  Laterality: N/A;  ? TEE WITHOUT CARDIOVERSION N/A 12/25/2018  ? Procedure: TRANSESOPHAGEAL ECHOCARDIOGRAM (TEE);  Surgeon: [name], MD;  Location: [facility];  Service: Thoracic;  Laterality: N/A;  ? tendon detachment Right   ? arm  ? TRANSTHORACIC ECHOCARDIOGRAM  07/2016  ? EF 60-65% with mild LVH.  Mild AI.  Trivial MR and TR.  ? VASECTOMY    ? ? ?Prior to Admission medications   ?Medication Sig Start Date End Date Taking? Authorizing Provider  ?acetaminophen (TYLENOL) 500 MG tablet Take 1,000 mg by mouth every 6 (six) hours as needed for moderate pain.   Yes [provider]  ?amitriptyline (ELAVIL) 25  MG tablet Take 0.5 tablets (12.5 mg total) by mouth at bedtime as needed for sleep. 05/26/21  Yes Laqueta Linden, MD  ?amLODipine (NORVASC) 5 MG tablet Take 5 mg by mouth daily.   Yes [provider]  ?atorvastatin (LIPITOR) 80 MG tablet Take 1 tablet (80 mg total) by mouth daily. 08/20/21  Yes Skeet Latch, MD  ?Cholecalciferol (VITAMIN D) 125 MCG (5000 UT) CAPS Take 5,000 Units by mouth daily.   Yes [provider]  ?clopidogrel (PLAVIX) 75 MG tablet TAKE 1 TABLET BY MOUTH EVERY DAY 07/06/21  Yes Skeet Latch, MD  ?ELIQUIS 5 MG TABS tablet TAKE 1 TABLET BY MOUTH TWICE A DAY 10/26/21  Yes Lendon Colonel, NP  ?escitalopram (LEXAPRO) 10 MG tablet Take 10 mg by mouth daily.   Yes [provider]  ?losartan (COZAAR) 50 MG tablet Take 1 tablet (50 mg total) by mouth daily. 10/28/21 02/23/22 Yes Skeet Latch, MD  ?metoprolol succinate (TOPROL-XL) 50 MG 24 hr tablet TAKE 1 TABLET BY MOUTH EVERY DAY WITH OR IMMEDIATELY  FOLLOWING A MEAL 12/07/21  Yes Skeet Latch, MD  ?nitroGLYCERIN (NITROSTAT) 0.4 MG SL tablet Place 1 tablet (0.4 mg total) under the tongue every 5 (five) minutes as needed for chest pain. 03/24/21  Yes Lendon Colonel, NP  ?pantoprazole (PROTONIX) 40 MG tablet TAKE ONE TABLET BY MOUTH ONE TIME DAILY. must make follow up appointment for further refills 10/07/14  Yes [name], MD  ?potassium chloride SA (KLOR-CON M) 20 MEQ tablet Take 20 mEq by mouth daily as needed (when taking furosemide).   Yes [provider]  ?amLODipine (NORVASC) 2.5 MG tablet Take 1 tablet (2.5 mg total) by mouth daily. 01/07/22   Skeet Latch, MD  ?torsemide (DEMADEX) 20 MG tablet Take 20 mg by mouth 2 (two) times daily as needed.    [provider]  ? ? ?No current facility-administered medications for this encounter.  ? ? ?Allergies as of 12/18/2021 - Review Complete 12/04/2021  ?Allergen Reaction Noted  ? Penicillins Hives and Nausea And Vomiting  06/27/2014  ? ? ?Family History  ?Problem Relation Age of Onset  ? Heart disease Mother   ? Hypertension Mother   ? Diabetes Mother   ? Heart failure Mother   ? Stroke Father   ? Other Father   ?     bypass surge

## 2022-03-01 NOTE — Op Note (Signed)
[facility] ?Patient Name: Devon Mathews ?Procedure Date: 03/01/2022 ?MRN: [id number] ?Attending MD: Gatha Mayer , MD ?Date of Birth: 1950-12-09 ?CSN: [id number] ?Age: 71 ?Admit Type: Outpatient ?Procedure:                Upper GI endoscopy ?Indications:              Melena, For therapy of gastric polyps ?Providers:                Gatha Mayer, MD, Carlyn Reichert, RN, Jacquelin Hawking  ?                          Houle, Technician ?Referring MD:              ?Medicines:                Monitored Anesthesia Care ?Complications:            No immediate complications. ?Estimated Blood Loss:     Estimated blood loss: none. ?Procedure:                Pre-Anesthesia Assessment: ?                          - Prior to the procedure, a History and Physical  ?                          was performed, and patient medications and  ?                          allergies were reviewed. The patient's tolerance of  ?                          previous anesthesia was also reviewed. The risks  ?                          and benefits of the procedure and the sedation  ?                          options and risks were discussed with the patient.  ?                          All questions were answered, and informed consent  ?                          was obtained. Prior Anticoagulants: The patient  ?                          last took Eliquis (apixaban) 2 days and Xarelto  ?                          (rivaroxaban) 5 days prior to the procedure. ASA  ?                          Grade Assessment: III - A patient with severe  ?  systemic disease. After reviewing the risks and  ?                          benefits, the patient was deemed in satisfactory  ?                          condition to undergo the procedure. ?                          After obtaining informed consent, the endoscope was  ?                          passed under direct vision. Throughout the  ?                          procedure, the patient's blood  pressure, pulse, and  ?                          oxygen saturations were monitored continuously. The  ?                          [id number]-H190 ([id number]) Olympus endoscope was introduced  ?                          through the mouth, and advanced to the second part  ?                          of duodenum. The upper GI endoscopy was  ?                          accomplished without difficulty. The patient  ?                          tolerated the procedure well. ?Scope In: ?Scope Out: ?Findings: ?     Multiple 1 to 8 mm sessile polyps with stigmata of recent bleeding were  ?     found in the gastric body. Two ligatures were successfully placed. There  ?     was no bleeding during and at the end of the procedure. Estimated blood  ?     loss: none. ?     The exam was otherwise without abnormality. ?     The cardia and gastric fundus were normal on retroflexion. ?Impression:               - Multiple gastric polyps. Ligated x 2 - both  ?                          friable polyps w/ stigmata of bleeding 4 mm and 8  ?                          mm. Previously seen and biopsied (hyperplastic).  ?                          Also has fundic gland polyps w/o bleeding stigmata  ?                          -  no therapy needed. ?                          - The examination was otherwise normal. ?                          - No specimens collected. ?Moderate Sedation: ?     Not Applicable - Patient had care per Anesthesia. ?Recommendation:           - Patient has a contact number available for  ?                          emergencies. The signs and symptoms of potential  ?                          delayed complications were discussed with the  ?                          patient. Return to normal activities tomorrow.  ?                          Written discharge instructions were provided to the  ?                          patient. ?                          - Resume previous diet. ?                          - Continue present medications. ?                           - Resume Eliquis (apixaban) tomorrow and Plavix  ?                          (clopidogrel) tomorrow at prior doses. ?Procedure Code(s):        --- Professional --- ?                          [phone], Esophagogastroduodenoscopy, flexible,  ?                          transoral; diagnostic, including collection of  ?                          specimen(s) by brushing or washing, when performed  ?                          (separate procedure) ?Diagnosis Code(s):        --- Professional --- ?                          K31.7, Polyp of stomach and duodenum ?                          K92.1, Melena (includes Hematochezia) ?CPT copyright 2019  American Medical Association. All rights reserved. ?The codes documented in this report are preliminary and upon coder review may  ?be revised to meet current compliance requirements. ?Gatha Mayer, MD ?03/01/2022 11:27:34 AM ?This report has been signed electronically. ?Number of Addenda: 0 ?

## 2022-03-01 NOTE — Transfer of Care (Signed)
Immediate Anesthesia Transfer of Care Note ? ?Patient: Devon Mathews. ? ?Procedure(s) Performed: ESOPHAGOGASTRODUODENOSCOPY (EGD) WITH PROPOFOL ?GASTRIC  BANDING ? ?Patient Location: Endoscopy Unit ? ?Anesthesia Type:MAC ? ?Level of Consciousness: drowsy ? ?Airway & Oxygen Therapy: Patient Spontanous Breathing and Patient connected to face mask oxygen ? ?Post-op Assessment: Report given to RN and Post -op Vital signs reviewed and stable ? ?Post vital signs: Reviewed and stable ? ?Last Vitals:  ?Vitals Value Taken Time  ?BP 125/34 03/01/22 1117  ?Temp 36.5 ?C 03/01/22 1117  ?Pulse 59 03/01/22 1117  ?Resp 15 03/01/22 1117  ?SpO2 100 % 03/01/22 1117  ?Vitals shown include unvalidated device data. ? ?Last Pain:  ?Vitals:  ? 03/01/22 1117  ?TempSrc: Temporal  ?PainSc:   ?   ? ?  ? ?Complications: No notable events documented. ?

## 2022-03-01 NOTE — Anesthesia Procedure Notes (Signed)
Procedure Name: [place] ?Date/Time: 03/01/2022 10:57 AM ?Performed by: Niel Hummer, CRNA ?Pre-anesthesia Checklist: Patient identified, Emergency Drugs available, Suction available and Patient being monitored ?Oxygen Delivery Method: Simple face mask ? ? ? ? ?

## 2022-03-01 NOTE — Discharge Instructions (Addendum)
I used rubber bands to treat two polyps that have been bleeding. The rubber bands squeeze the tissue which will die and fall off over next few days. There is a risk of bleeding afterwards so if stools get very dark or red let me know. Odds are against this but there is a chance. ? ?We knew these polyps were benign so no tissue samples today. ? ?Resume Plavix and Eliquis tomorrow. ? ?I appreciate the opportunity to care for you. ?Gatha Mayer, MD, [facility] ? ?YOU HAD AN ENDOSCOPIC PROCEDURE TODAY: Refer to the procedure report and other information in the discharge instructions given to you for any specific questions about what was found during the examination. If this information does not answer your questions, please call Dr. Celesta Aver office at [phone] to clarify.  ? ?YOU SHOULD EXPECT: Some feelings of bloating in the abdomen. Passage of more gas than usual. Walking can help get rid of the air that was put into your GI tract during the procedure and reduce the bloating.  ? ?DIET: Your first meal following the procedure should be a light meal and then it is ok to progress to your normal diet. A half-sandwich or bowl of soup is an example of a good first meal. Heavy or fried foods are harder to digest and may make you feel nauseous or bloated. Drink plenty of fluids but you should avoid alcoholic beverages for 24 hours.  ? ?ACTIVITY: Your care partner should take you home directly after the procedure. You should plan to take it easy, moving slowly for the rest of the day. You can resume normal activity the day after the procedure however YOU SHOULD NOT DRIVE, use power tools, machinery or perform tasks that involve climbing or major physical exertion for 24 hours (because of the sedation medicines used during the test).  ? ?SYMPTOMS TO REPORT IMMEDIATELY: ?A gastroenterologist can be reached at any hour. Please call [phone]  for any of the following symptoms:  ?Following upper endoscopy (EGD, EUS, ERCP,  esophageal dilation) ?Vomiting of blood or coffee ground material  ?New, significant abdominal pain  ?New, significant chest pain or pain under the shoulder blades  ?Painful or persistently difficult swallowing  ?New shortness of breath  ?Black, tarry-looking or red, bloody stools ? ?FOLLOW UP:  ?If any biopsies were taken you will be contacted by phone or by letter within the next 1-3 weeks. Call [phone]  if you have not heard about the biopsies in 3 weeks.  ?Please also call with any specific questions about appointments or follow up tests. ? ? ?

## 2022-03-01 NOTE — Anesthesia Preprocedure Evaluation (Addendum)
Anesthesia Evaluation  ?Patient identified by MRN, date of birth, ID band ?Patient awake ? ? ? ?Reviewed: ?Allergy & Precautions, NPO status , Patient's Chart, lab work & pertinent test results, reviewed documented beta blocker date and time  ? ?History of Anesthesia Complications ?Negative for: history of anesthetic complications ? ?Airway ?Mallampati: I ? ?TM Distance: >3 FB ?Neck ROM: Full ? ? ? Dental ? ?(+) Dental Advisory Given, Chipped ?  ?Pulmonary ?sleep apnea and Continuous Positive Airway Pressure Ventilation ,  ?  ?breath sounds clear to auscultation ? ? ? ? ? ? Cardiovascular ?hypertension, Pt. on medications and Pt. on home beta blockers ?(-) angina+ CAD, + Past MI and + Cardiac Stents  ?+ dysrhythmias Atrial Fibrillation  ?Rhythm:Regular Rate:Normal ? ?'22 ECHO: EF 60 to 65%. The LV has normal function, no regional wall motion abnormalities. Left ventricular diastolic parameters were normal. RV function is normal. ?No significant valvular abnormalities ?  ?Neuro/Psych ?Anxiety TIA  ? GI/Hepatic ?Neg liver ROS, GERD  Medicated and Controlled,  ?Endo/Other  ?diabetes (diet management)Morbid obesity ? Renal/GU ?negative Renal ROS  ? ?  ?Musculoskeletal ? ? Abdominal ?(+) + obese,   ?Peds ? Hematology ?Eliquis, plavix: last dose Sat   ?Anesthesia Other Findings ? ? Reproductive/Obstetrics ? ?  ? ? ? ? ? ? ? ? ? ? ? ? ? ?  ?  ? ? ? ? ? ? ? ?Anesthesia Physical ?Anesthesia Plan ? ?ASA: 3 ? ?Anesthesia Plan: MAC  ? ?Post-op Pain Management: Minimal or no pain anticipated  ? ?Induction:  ? ?PONV Risk Score and Plan: 1 and Ondansetron and Treatment may vary due to age or medical condition ? ?Airway Management Planned: Natural Airway and Nasal Cannula ? ?Additional Equipment: None ? ?Intra-op Plan:  ? ?Post-operative Plan:  ? ?Informed Consent: I have reviewed the patients History and Physical, chart, labs and discussed the procedure including the risks, benefits and  alternatives for the proposed anesthesia with the patient or authorized representative who has indicated his/her understanding and acceptance.  ? ? ? ?Dental advisory given ? ?Plan Discussed with: CRNA and Surgeon ? ?Anesthesia Plan Comments:   ? ? ? ? ? ?Anesthesia Quick Evaluation ? ?

## 2022-03-01 NOTE — Anesthesia Postprocedure Evaluation (Signed)
Anesthesia Post Note ? ?Patient: Devon Mathews. ? ?Procedure(s) Performed: ESOPHAGOGASTRODUODENOSCOPY (EGD) WITH PROPOFOL ?GASTRIC  BANDING ? ?  ? ?Patient location during evaluation: Endoscopy ?Anesthesia Type: MAC ?Level of consciousness: patient cooperative, awake and alert and oriented ?Pain management: pain level controlled ?Vital Signs Assessment: post-procedure vital signs reviewed and stable ?Respiratory status: nonlabored ventilation, spontaneous breathing and respiratory function stable ?Cardiovascular status: blood pressure returned to baseline and stable ?Postop Assessment: no apparent nausea or vomiting and able to ambulate ?Anesthetic complications: no ? ? ?No notable events documented. ? ?Last Vitals:  ?Vitals:  ? 03/01/22 1130 03/01/22 1140  ?BP: (!) 124/42   ?Pulse: (!) 56 (!) 55  ?Resp: 17 (!) 21  ?Temp:    ?SpO2: 93% 95%  ?  ?Last Pain:  ?Vitals:  ? 03/01/22 1140  ?TempSrc:   ?PainSc: 0-No pain  ? ? ?  ?  ?  ?  ?  ?  ? ?Jamillia Closson,E. Marik Sedore ? ? ? ? ?

## 2022-03-02 ENCOUNTER — Encounter (HOSPITAL_COMMUNITY): Payer: Self-pay | Admitting: Internal Medicine

## 2022-03-18 NOTE — Progress Notes (Addendum)
TeleHealth Visit:  This visit was completed with telemedicine (audio/video) technology. Devon Mathews has verbally consented to this TeleHealth visit. The patient is located at home, the provider is located at home. The participants in this visit include the listed provider and patient. The visit was conducted today via MyChart video.  OBESITY Devon Mathews is here to discuss his progress with his obesity treatment plan along with follow-up of his obesity related diagnoses.   Today's visit was # 17 Starting weight: 284 lbs Starting date: 04/28/2021 Weight at last in office visit: 257 lbs on 01/26/22 Total weight loss: 27 lbs at last in office visit on 01/26/22. Today's reported weight: 268 lbs   Nutrition Plan: Category 2 Plan and keeping a food journal and adhering to recommended goals of 1500-1700 calories and 120 gms protein..  Hunger is well controlled. Cravings are well controlled.  Current exercise: walking 20 minutes 2-3 days per week  Interim History: Devon Mathews is still working out of town during the week and will be for the next 3 weeks.  He has breakfast mostly on plan at the hotel.  Eats out lunch and dinner but tries to make good choices.  Keep snack calories less than 300.  Denies intake of caloric beverages.  He does note more snacking between meals recently due to boredom and loneliness.  Denies large portions.  However, he is up 11 pounds since his last in office visit on January 26, 2022.   Assessment/Plan:  1. Type II Diabetes HgbA1c is at goal. CBGs: Not checking Episodes of hypoglycemia? no Medication(s): none  Lab Results  Component Value Date   HGBA1C 5.6 09/21/2021   HGBA1C 6.2 04/17/2021   HGBA1C 6.9 (H) 03/09/2021   Lab Results  Component Value Date   MICROALBUR 5.0 04/17/2021   LDLCALC 39 01/07/2022   CREATININE 1.11 01/07/2022    Plan: Discussed Mounjaro and he is open to trying this. Dhruv denies personal or family history of thyroid cancer, history of pancreatitis,  or current cholelithiasis. Colburn was informed of the most common side effects (nausea, constipation, diarrhea). New Rx: Mounjaro 2.5 mg weekly  tirzepatide (MOUNJARO) 2.5 MG/0.5ML Pen    Sig: Inject 2.5 mg into the skin once a week.    Dispense:  2 mL    Refill:  0    2.  Hypertension associated with type 2 diabetes Hypertension borderline controlled.  Blood pressures this morning 145/67, 137/75, pulse 55 Medication(s): Amlodipine 2.5 mg, losartan 50 mg, metoprolol XL 50 mg daily.  He is compliant with all medications. Denies chest pain and SOB.  BP Readings from Last 3 Encounters:  03/01/22 (!) (P) 131/40  01/26/22 118/64  01/07/22 106/62   Lab Results  Component Value Date   CREATININE 1.11 01/07/2022   CREATININE 1.03 09/21/2021   CREATININE 1.3 04/17/2021    Plan: Continue all medications at current dosages. Continue to monitor.   3. CHF Notes ankle swelling has been minimal over the past few weeks and he has not needed his torsemide.  He has been instructed by cardiology to call them with greater than 3 pound weight gain in a day or greater than 5 pounds in a week. Medications-torsemide 20 mg twice daily as needed, metoprolol XL 50 mg daily, losartan 50 mg daily, amlodipine 2.5 mg daily.  Plan: Continue all medications as directed by cardiology. Follow-up with cardiology as directed-next appointment scheduled for May 18, 2022-Dr. Skeet Latch.  4. Obesity: Current BMI 37.93 Devon Mathews is currently in the action stage of  change. As such, his goal is to continue with weight loss efforts.  He has agreed to Category 2 Plan and keeping a food journal and adhering to recommended goals of 1500-1700 calories and 120 gms protein.Devon Mathews   Exercise goals: Encouraged him to start walking for exercise at the end of the month when he is done traveling.  Discussed possibly joining [employer] sneakers if he has the benefit for this.  Behavioral modification strategies: increasing lean protein  intake, decreasing simple carbohydrates, meal planning and cooking strategies, and better snacking choices.  Devon Mathews has agreed to follow-up with our clinic in 3 weeks.   No orders of the defined types were placed in this encounter.   There are no discontinued medications.   Meds ordered this encounter  Medications   tirzepatide (MOUNJARO) 2.5 MG/0.5ML Pen    Sig: Inject 2.5 mg into the skin once a week.    Dispense:  2 mL    Refill:  0    Order Specific Question:   Supervising Provider    Answer:   Dennard Nip D [[id number]]      Objective:   VITALS: Per patient if applicable, see vitals. GENERAL: Alert and in no acute distress. CARDIOPULMONARY: No increased WOB. Speaking in clear sentences.  PSYCH: Pleasant and cooperative. Speech normal rate and rhythm. Affect is appropriate. Insight and judgement are appropriate. Attention is focused, linear, and appropriate.  NEURO: Oriented as arrived to appointment on time with no prompting.   Lab Results  Component Value Date   CREATININE 1.11 01/07/2022   BUN 15 01/07/2022   NA 140 01/07/2022   K 4.5 01/07/2022   CL 107 (H) 01/07/2022   CO2 23 01/07/2022   Lab Results  Component Value Date   ALT 16 01/07/2022   AST 15 01/07/2022   ALKPHOS 72 01/07/2022   BILITOT 0.3 01/07/2022   Lab Results  Component Value Date   HGBA1C 5.6 09/21/2021   HGBA1C 6.2 04/17/2021   HGBA1C 6.9 (H) 03/09/2021   HGBA1C 5.5 12/21/2018   HGBA1C 5.2 09/14/2018   Lab Results  Component Value Date   INSULIN 32.1 (H) 09/21/2021   INSULIN 55.5 (H) 04/28/2021   Lab Results  Component Value Date   TSH 5.530 (H) 09/21/2021   Lab Results  Component Value Date   CHOL 87 (L) 01/07/2022   HDL 35 (L) 01/07/2022   LDLCALC 39 01/07/2022   TRIG 52 01/07/2022   CHOLHDL 2.5 01/07/2022   Lab Results  Component Value Date   WBC 4.8 04/17/2021   HGB 12.8 (A) 04/17/2021   HCT 37 (A) 04/17/2021   MCV 98 (H) 06/25/2020   PLT 173 04/17/2021   No results  found for: IRON, TIBC, FERRITIN Lab Results  Component Value Date   VD25OH 77.0 09/21/2021   VD25OH 73.3 04/28/2021    Attestation Statements:   Reviewed by clinician on day of visit: allergies, medications, problem list, medical history, surgical history, family history, social history, and previous encounter notes.

## 2022-03-22 ENCOUNTER — Telehealth (INDEPENDENT_AMBULATORY_CARE_PROVIDER_SITE_OTHER): Payer: Medicare HMO | Admitting: Family Medicine

## 2022-03-22 ENCOUNTER — Encounter (INDEPENDENT_AMBULATORY_CARE_PROVIDER_SITE_OTHER): Payer: Self-pay

## 2022-03-22 ENCOUNTER — Encounter (INDEPENDENT_AMBULATORY_CARE_PROVIDER_SITE_OTHER): Payer: Self-pay | Admitting: Family Medicine

## 2022-03-22 ENCOUNTER — Other Ambulatory Visit (INDEPENDENT_AMBULATORY_CARE_PROVIDER_SITE_OTHER): Payer: Self-pay | Admitting: Family Medicine

## 2022-03-22 ENCOUNTER — Telehealth (INDEPENDENT_AMBULATORY_CARE_PROVIDER_SITE_OTHER): Payer: Self-pay | Admitting: Family Medicine

## 2022-03-22 DIAGNOSIS — Z6837 Body mass index (BMI) 37.0-37.9, adult: Secondary | ICD-10-CM | POA: Diagnosis not present

## 2022-03-22 DIAGNOSIS — E669 Obesity, unspecified: Secondary | ICD-10-CM

## 2022-03-22 DIAGNOSIS — E119 Type 2 diabetes mellitus without complications: Secondary | ICD-10-CM

## 2022-03-22 DIAGNOSIS — I152 Hypertension secondary to endocrine disorders: Secondary | ICD-10-CM

## 2022-03-22 DIAGNOSIS — E1159 Type 2 diabetes mellitus with other circulatory complications: Secondary | ICD-10-CM | POA: Diagnosis not present

## 2022-03-22 DIAGNOSIS — I509 Heart failure, unspecified: Secondary | ICD-10-CM | POA: Diagnosis not present

## 2022-03-22 DIAGNOSIS — E1169 Type 2 diabetes mellitus with other specified complication: Secondary | ICD-10-CM | POA: Diagnosis not present

## 2022-03-22 MED ORDER — OZEMPIC (0.25 OR 0.5 MG/DOSE) 2 MG/3ML ~~LOC~~ SOPN: 0.2500 mg | PEN_INJECTOR | SUBCUTANEOUS | 0 refills | Status: DC

## 2022-03-22 MED ORDER — TIRZEPATIDE 2.5 MG/0.5ML ~~LOC~~ SOAJ: 2.5000 mg | SUBCUTANEOUS | 0 refills | Status: DC

## 2022-03-22 NOTE — Telephone Encounter (Signed)
FYI

## 2022-03-22 NOTE — Telephone Encounter (Signed)
Devon Mathews - Prior authorization denied for [employer]. Per insurance: The requested drug is not on your plan's formulary (list of covered drugs). Talk to your prescriber to see if the following covered alternative(s) would be right for you: Bydureon BCise auto-injector (quantity limit of 3.40 mL every 28 days) Byetta pen-injector (Different quantity limits apply depending on the strength of the medication prescribed. Please consult the Medicare Part D plan's formulary for the specific quantity limit.) Ozempic pen-injector (Different quantity limits apply depending on the strength of the medication prescribed. Please consult the Medicare Part D plan's formulary for the specific quantity limit.) Rybelsus tablet (quantity limit of 30 tablets every 30 days) Soliqua 100/33 pen-injector (quantity limit of 15 mL every 25 days) Trulicity pen-injector (quantity limit of 2 mL every 28 days) Victoza pen-injector (quantity limit of 9 mL every 30 days) Xultophy 100/3.6 pen-injector (quantity limit of 15 mL every 30 days) patient sent denial message via mychart.

## 2022-03-22 NOTE — Telephone Encounter (Signed)
Prior authorization has already been started for [facility]. Will notify patient and provider once response is received.

## 2022-03-22 NOTE — Telephone Encounter (Signed)
PA required.

## 2022-03-22 NOTE — Telephone Encounter (Signed)
[place] - Prior authorization not required for [employer]. Patient sent message via mychart, no prior authorization needed.

## 2022-03-26 DIAGNOSIS — R350 Frequency of micturition: Secondary | ICD-10-CM | POA: Diagnosis not present

## 2022-03-26 DIAGNOSIS — N3943 Post-void dribbling: Secondary | ICD-10-CM | POA: Diagnosis not present

## 2022-03-26 DIAGNOSIS — R3915 Urgency of urination: Secondary | ICD-10-CM | POA: Diagnosis not present

## 2022-04-06 ENCOUNTER — Other Ambulatory Visit (HOSPITAL_BASED_OUTPATIENT_CLINIC_OR_DEPARTMENT_OTHER): Payer: Self-pay | Admitting: Cardiovascular Disease

## 2022-04-06 NOTE — Telephone Encounter (Signed)
Rx request sent to pharmacy.  

## 2022-04-09 ENCOUNTER — Other Ambulatory Visit (INDEPENDENT_AMBULATORY_CARE_PROVIDER_SITE_OTHER): Payer: Self-pay | Admitting: Family Medicine

## 2022-04-09 DIAGNOSIS — E119 Type 2 diabetes mellitus without complications: Secondary | ICD-10-CM

## 2022-04-12 ENCOUNTER — Ambulatory Visit (INDEPENDENT_AMBULATORY_CARE_PROVIDER_SITE_OTHER): Payer: Medicare HMO | Admitting: Nurse Practitioner

## 2022-04-27 ENCOUNTER — Ambulatory Visit (INDEPENDENT_AMBULATORY_CARE_PROVIDER_SITE_OTHER): Payer: Medicare HMO | Admitting: Nurse Practitioner

## 2022-04-27 ENCOUNTER — Encounter (INDEPENDENT_AMBULATORY_CARE_PROVIDER_SITE_OTHER): Payer: Self-pay | Admitting: Nurse Practitioner

## 2022-04-27 VITALS — HR 80 | Temp 98.3°F | Ht 69.0 in | Wt 265.0 lb

## 2022-04-27 DIAGNOSIS — Z7985 Long-term (current) use of injectable non-insulin antidiabetic drugs: Secondary | ICD-10-CM | POA: Diagnosis not present

## 2022-04-27 DIAGNOSIS — E669 Obesity, unspecified: Secondary | ICD-10-CM

## 2022-04-27 DIAGNOSIS — E119 Type 2 diabetes mellitus without complications: Secondary | ICD-10-CM

## 2022-04-27 DIAGNOSIS — Z6839 Body mass index (BMI) 39.0-39.9, adult: Secondary | ICD-10-CM

## 2022-04-27 MED ORDER — OZEMPIC (0.25 OR 0.5 MG/DOSE) 2 MG/3ML ~~LOC~~ SOPN: 0.2500 mg | PEN_INJECTOR | SUBCUTANEOUS | 0 refills | Status: DC

## 2022-04-28 NOTE — Progress Notes (Unsigned)
Chief Complaint:   OBESITY Devon Mathews is here to discuss his progress with his obesity treatment plan along with follow-up of his obesity related diagnoses. Devon Mathews is on the Category 2 Plan and keeping a food journal and adhering to recommended goals of 1500-1700 calories and 120 protein and states he is following his eating plan approximately 75-80% of the time. Devon Mathews states he is exercising 0 minutes 0 times per week.  Today's visit was #: 18 Starting weight: 284 lbs Starting date: 04/28/2021 Today's weight: 265 lbs Today's date: 04/27/2022 Total lbs lost to date: 19 lbs Total lbs lost since last in-office visit: 0  Interim History: Devon Mathews, over the past 3 months, has been traveling more and has been staying at hotels and eating out more. He has gotten off track.  Now that he is back home, since July 1st, and won't be traveling has much he is looking forward to getting back on track.  Denies hunger and cravings since starting Ozempic. Drinking water, unsweetened tea and soda.  No current celebrations or vacations coming up.   Subjective:   1. Type 2 diabetes mellitus without complication, without long-term current use of insulin (HCC) Yerik is currently taking Ozempic 0.25 mg. He had some nausea and rarely dizziness when initially started taking Ozempic. No longer having side effects. He does not check blood sugars at home. His last eye exam was June 2023. Currently on statin and ARB.  Assessment/Plan:   1. Type 2 diabetes mellitus without complication, without long-term current use of insulin (HCC) We will refill Ozempic 0.25 mg SubQ once weekly for 1 month with 0 refills. Side effects discussed.  -Refill Semaglutide,0.25 or 0.'5MG'$ /DOS, (OZEMPIC, 0.25 OR 0.5 MG/DOSE,) 2 MG/3ML SOPN; Inject 0.25 mg into the skin once a week.  Dispense: 3 mL; Refill: 0  Good blood sugar control is important to decrease the likelihood of diabetic complications such as nephropathy, neuropathy, limb loss, blindness,  coronary artery disease, and death. Intensive lifestyle modification including diet, exercise and weight loss are the first line of treatment for diabetes.    2. Obesity with current BMI of 39.3 Devon Mathews is currently in the action stage of change. As such, his goal is to continue with weight loss efforts. He has agreed to the Category 3 Plan and keeping a food journal and adhering to recommended goals of 1500-1700 calories and 120 grams of protein.   Exercise goals: All adults should avoid inactivity. Some physical activity is better than none, and adults who participate in any amount of physical activity gain some health benefits.  Behavioral modification strategies: increasing lean protein intake, increasing water intake, and planning for success.  Devon Mathews has agreed to follow-up with our clinic in 3 weeks. He was informed of the importance of frequent follow-up visits to maximize his success with intensive lifestyle modifications for his multiple health conditions.   Objective:   Pulse 80, temperature 98.3 F (36.8 C), height '5\' 9"'$  (1.753 m), weight 265 lb (120.2 kg), SpO2 96 %. Body mass index is 39.13 kg/m.  General: Cooperative, alert, well developed, in no acute distress. HEENT: Conjunctivae and lids unremarkable. Cardiovascular: Regular rhythm.  Lungs: Normal work of breathing. Neurologic: No focal deficits.   Lab Results  Component Value Date   CREATININE 1.11 01/07/2022   BUN 15 01/07/2022   NA 140 01/07/2022   K 4.5 01/07/2022   CL 107 (H) 01/07/2022   CO2 23 01/07/2022   Lab Results  Component Value Date  ALT 16 01/07/2022   AST 15 01/07/2022   ALKPHOS 72 01/07/2022   BILITOT 0.3 01/07/2022   Lab Results  Component Value Date   HGBA1C 5.6 09/21/2021   HGBA1C 6.2 04/17/2021   HGBA1C 6.9 (H) 03/09/2021   HGBA1C 5.5 12/21/2018   HGBA1C 5.2 09/14/2018   Lab Results  Component Value Date   INSULIN 32.1 (H) 09/21/2021   INSULIN 55.5 (H) 04/28/2021   Lab Results   Component Value Date   TSH 5.530 (H) 09/21/2021   Lab Results  Component Value Date   CHOL 87 (L) 01/07/2022   HDL 35 (L) 01/07/2022   LDLCALC 39 01/07/2022   TRIG 52 01/07/2022   CHOLHDL 2.5 01/07/2022   Lab Results  Component Value Date   VD25OH 77.0 09/21/2021   VD25OH 73.3 04/28/2021   Lab Results  Component Value Date   WBC 4.8 04/17/2021   HGB 12.8 (A) 04/17/2021   HCT 37 (A) 04/17/2021   MCV 98 (H) 06/25/2020   PLT 173 04/17/2021   No results found for: "IRON", "TIBC", "FERRITIN"  Attestation Statements:   Reviewed by clinician on day of visit: allergies, medications, problem list, medical history, surgical history, family history, social history, and previous encounter notes.  I, Brendell Tyus, RMA, am acting as transcriptionist for Everardo Pacific, FNP.  I have reviewed the above documentation for accuracy and completeness, and I agree with the above. Everardo Pacific, FNP

## 2022-05-04 DIAGNOSIS — M1711 Unilateral primary osteoarthritis, right knee: Secondary | ICD-10-CM | POA: Diagnosis not present

## 2022-05-05 ENCOUNTER — Telehealth: Payer: Self-pay | Admitting: Cardiovascular Disease

## 2022-05-05 MED ORDER — ELIQUIS 5 MG PO TABS: 5.0000 mg | ORAL_TABLET | Freq: Two times a day (BID) | ORAL | 2 refills | Status: DC

## 2022-05-05 NOTE — Telephone Encounter (Signed)
Please review for refill. Thank you! 

## 2022-05-05 NOTE — Telephone Encounter (Signed)
Eliquis '5mg'$  refill request received. Patient is 71 years old, weight-120.2kg, Crea-1.11 on 01/07/2022, Diagnosis-Afib, and last seen by Dr. Oval [name] on 01/07/2022. Dose is appropriate based on dosing criteria. Will send in refill to requested pharmacy.

## 2022-05-05 NOTE — Telephone Encounter (Signed)
*  STAT* If patient is at the pharmacy, call can be transferred to refill team.   1. Which medications need to be refilled? (please list name of each medication and dose if known)   ELIQUIS 5 MG TABS tablet    2. Which pharmacy/location (including street and city if local pharmacy) is medication to be sent to? [id number] [address] [place] [address]. Do they need a 30 day or 90 day supply?  90 day

## 2022-05-07 DIAGNOSIS — M109 Gout, unspecified: Secondary | ICD-10-CM | POA: Diagnosis not present

## 2022-05-07 DIAGNOSIS — Z0000 Encounter for general adult medical examination without abnormal findings: Secondary | ICD-10-CM | POA: Diagnosis not present

## 2022-05-07 DIAGNOSIS — R7301 Impaired fasting glucose: Secondary | ICD-10-CM | POA: Diagnosis not present

## 2022-05-07 DIAGNOSIS — E7800 Pure hypercholesterolemia, unspecified: Secondary | ICD-10-CM | POA: Diagnosis not present

## 2022-05-07 DIAGNOSIS — Z125 Encounter for screening for malignant neoplasm of prostate: Secondary | ICD-10-CM | POA: Diagnosis not present

## 2022-05-07 DIAGNOSIS — R7989 Other specified abnormal findings of blood chemistry: Secondary | ICD-10-CM | POA: Diagnosis not present

## 2022-05-07 LAB — CBC AND DIFFERENTIAL
HCT: 37 — AB (ref 41–53)
Hemoglobin: 12.6 — AB (ref 13.5–17.5)
Platelets: 163 10*3/uL (ref 150–400)
WBC: 4.6

## 2022-05-07 LAB — CBC: RBC: 4 (ref 3.87–5.11)

## 2022-05-07 LAB — COMPREHENSIVE METABOLIC PANEL
Albumin: 3.4 — AB (ref 3.5–5.0)
Calcium: 8.6 — AB (ref 8.7–10.7)

## 2022-05-07 LAB — LIPID PANEL
Cholesterol: 101 (ref 0–200)
HDL: 47 (ref 35–70)
LDL Cholesterol: 40
LDl/HDL Ratio: 0.9
Triglycerides: 68 (ref 40–160)

## 2022-05-07 LAB — PSA: PSA: 0.447

## 2022-05-07 LAB — BASIC METABOLIC PANEL
BUN: 19 (ref 4–21)
CO2: 28 — AB (ref 13–22)
Chloride: 109 — AB (ref 99–108)
Glucose: 118
Potassium: 4.9 mEq/L (ref 3.5–5.1)
Sodium: 139 (ref 137–147)

## 2022-05-07 LAB — PROTEIN / CREATININE RATIO, URINE
Albumin, U: 3.4
Creatinine, Urine: 0.9

## 2022-05-10 ENCOUNTER — Telehealth: Payer: Self-pay | Admitting: Cardiovascular Disease

## 2022-05-10 NOTE — Telephone Encounter (Signed)
  Pt c/o medication issue:  1. Name of Medication: clopidogrel (PLAVIX) 75 MG tablet ELIQUIS 5 MG TABS tablet  2. How are you currently taking this medication (dosage and times per day)? As written  3. Are you having a reaction (difficulty breathing--STAT)? no  4. What is your medication issue? [facility] stating that pt told them he is supposed to be on plavix and eliquis. She is calling to clarify if this is true because usually when you're on one, you wont be on the other. Please advise.

## 2022-05-10 NOTE — Telephone Encounter (Signed)
Please confirm that patient is to be on Eliquis and Plavix?

## 2022-05-11 ENCOUNTER — Other Ambulatory Visit (HOSPITAL_BASED_OUTPATIENT_CLINIC_OR_DEPARTMENT_OTHER): Payer: Self-pay | Admitting: Cardiovascular Disease

## 2022-05-11 NOTE — Telephone Encounter (Signed)
Rx(s) sent to pharmacy electronically.  

## 2022-05-14 DIAGNOSIS — I480 Paroxysmal atrial fibrillation: Secondary | ICD-10-CM | POA: Diagnosis not present

## 2022-05-14 DIAGNOSIS — I509 Heart failure, unspecified: Secondary | ICD-10-CM | POA: Diagnosis not present

## 2022-05-14 DIAGNOSIS — Z1331 Encounter for screening for depression: Secondary | ICD-10-CM | POA: Diagnosis not present

## 2022-05-14 DIAGNOSIS — R82998 Other abnormal findings in urine: Secondary | ICD-10-CM | POA: Diagnosis not present

## 2022-05-14 DIAGNOSIS — Z1339 Encounter for screening examination for other mental health and behavioral disorders: Secondary | ICD-10-CM | POA: Diagnosis not present

## 2022-05-14 DIAGNOSIS — E7800 Pure hypercholesterolemia, unspecified: Secondary | ICD-10-CM | POA: Diagnosis not present

## 2022-05-14 DIAGNOSIS — I2510 Atherosclerotic heart disease of native coronary artery without angina pectoris: Secondary | ICD-10-CM | POA: Diagnosis not present

## 2022-05-14 DIAGNOSIS — Z7901 Long term (current) use of anticoagulants: Secondary | ICD-10-CM | POA: Diagnosis not present

## 2022-05-14 DIAGNOSIS — M109 Gout, unspecified: Secondary | ICD-10-CM | POA: Diagnosis not present

## 2022-05-14 DIAGNOSIS — K317 Polyp of stomach and duodenum: Secondary | ICD-10-CM | POA: Diagnosis not present

## 2022-05-14 DIAGNOSIS — G4733 Obstructive sleep apnea (adult) (pediatric): Secondary | ICD-10-CM | POA: Diagnosis not present

## 2022-05-14 DIAGNOSIS — I119 Hypertensive heart disease without heart failure: Secondary | ICD-10-CM | POA: Diagnosis not present

## 2022-05-14 DIAGNOSIS — Z0000 Encounter for general adult medical examination without abnormal findings: Secondary | ICD-10-CM | POA: Diagnosis not present

## 2022-05-14 DIAGNOSIS — R7301 Impaired fasting glucose: Secondary | ICD-10-CM | POA: Diagnosis not present

## 2022-05-17 NOTE — Telephone Encounter (Signed)
RN returned call to pharmacy and verified that patient should in fact be on both, ok to refill.

## 2022-05-18 ENCOUNTER — Ambulatory Visit (HOSPITAL_BASED_OUTPATIENT_CLINIC_OR_DEPARTMENT_OTHER): Payer: Medicare HMO | Admitting: Cardiovascular Disease

## 2022-05-19 ENCOUNTER — Encounter (INDEPENDENT_AMBULATORY_CARE_PROVIDER_SITE_OTHER): Payer: Self-pay | Admitting: Nurse Practitioner

## 2022-05-19 ENCOUNTER — Ambulatory Visit (INDEPENDENT_AMBULATORY_CARE_PROVIDER_SITE_OTHER): Payer: Medicare HMO | Admitting: Nurse Practitioner

## 2022-05-19 VITALS — BP 133/63 | HR 60 | Temp 98.0°F | Ht 69.0 in | Wt 270.0 lb

## 2022-05-19 DIAGNOSIS — Z6841 Body Mass Index (BMI) 40.0 and over, adult: Secondary | ICD-10-CM

## 2022-05-19 DIAGNOSIS — E669 Obesity, unspecified: Secondary | ICD-10-CM | POA: Diagnosis not present

## 2022-05-19 DIAGNOSIS — E119 Type 2 diabetes mellitus without complications: Secondary | ICD-10-CM

## 2022-05-19 DIAGNOSIS — Z7985 Long-term (current) use of injectable non-insulin antidiabetic drugs: Secondary | ICD-10-CM | POA: Diagnosis not present

## 2022-05-20 ENCOUNTER — Other Ambulatory Visit (INDEPENDENT_AMBULATORY_CARE_PROVIDER_SITE_OTHER): Payer: Self-pay | Admitting: Nurse Practitioner

## 2022-05-20 DIAGNOSIS — E119 Type 2 diabetes mellitus without complications: Secondary | ICD-10-CM

## 2022-05-20 MED ORDER — OZEMPIC (0.25 OR 0.5 MG/DOSE) 2 MG/3ML ~~LOC~~ SOPN: 0.2500 mg | PEN_INJECTOR | SUBCUTANEOUS | 0 refills | Status: DC

## 2022-05-24 ENCOUNTER — Encounter (HOSPITAL_BASED_OUTPATIENT_CLINIC_OR_DEPARTMENT_OTHER): Payer: Self-pay | Admitting: Cardiovascular Disease

## 2022-05-24 ENCOUNTER — Ambulatory Visit (HOSPITAL_BASED_OUTPATIENT_CLINIC_OR_DEPARTMENT_OTHER): Payer: Medicare HMO | Admitting: Cardiovascular Disease

## 2022-05-24 VITALS — BP 122/62 | HR 61 | Ht 69.0 in | Wt 273.4 lb

## 2022-05-24 DIAGNOSIS — I5032 Chronic diastolic (congestive) heart failure: Secondary | ICD-10-CM | POA: Diagnosis not present

## 2022-05-24 DIAGNOSIS — I480 Paroxysmal atrial fibrillation: Secondary | ICD-10-CM | POA: Diagnosis not present

## 2022-05-24 DIAGNOSIS — Z9861 Coronary angioplasty status: Secondary | ICD-10-CM

## 2022-05-24 DIAGNOSIS — I2510 Atherosclerotic heart disease of native coronary artery without angina pectoris: Secondary | ICD-10-CM

## 2022-05-24 DIAGNOSIS — I10 Essential (primary) hypertension: Secondary | ICD-10-CM | POA: Diagnosis not present

## 2022-05-24 DIAGNOSIS — G4733 Obstructive sleep apnea (adult) (pediatric): Secondary | ICD-10-CM | POA: Diagnosis not present

## 2022-05-24 DIAGNOSIS — E669 Obesity, unspecified: Secondary | ICD-10-CM | POA: Diagnosis not present

## 2022-05-24 NOTE — Assessment & Plan Note (Signed)
No recent episodes.  Continue metoprolol and Eliquis.

## 2022-05-24 NOTE — Assessment & Plan Note (Signed)
Blood pressure is well-controlled on amlodipine, losartan, and metoprolol.  Referred him to the PREP program at the [facility].

## 2022-05-24 NOTE — Assessment & Plan Note (Signed)
Working on weight loss as above.  Recommended that he increase to the next dose of Ozempic at 0.5 mg weekly which is already at the pharmacy for him.

## 2022-05-24 NOTE — Progress Notes (Signed)
Cardiology Office Note   Date:  05/24/2022   ID:  Devon Glazier., DOB 12-11-1950, MRN [id number]  PCP:  Haywood Pao, MD  Cardiologist:  None  Electrophysiologist:  None  Sleep: Dr. Claiborne Billings  Evaluation Performed:  Follow-Up Visit  Chief Complaint:  Follow up  History of Present Illness:    Devon Mathews is a 71 y.o. male with CAD s/p NSTEMI and PCI, paroxysmal atrial fibrillation, procedural pericardial effusion/tamponade s/p pericardial window, mild AR, hypertension, hyperlipidemia, OSA and obestiy who presents for follow up.  Devon Mathews was previously seen by Dr. Rollene Fare and by Dr. Einar Gip.  He last saw Dr. Einar Gip 01/2015 and reported mild, chronic dyspnea. He underwent cardiac catheterization in 2003 and 2006 with a 40% LAD lesion noted.  He had an ETT in 2007 that was negative for ischemia and a nuclear stress in 2011 and 2012 that were negative.  He had an echo 07/02/13 that revealed LVEF 55-60% and mild mitral regurgitation. He was seen in clinic 07/2016 and reported several months of chest pain and exertional shortness of breath.  He was referred for exercise Myoview 08/13/16 that revealed LVEF 63% and no ischemia.  He also had an echo 08/16/16 with LVEF 60-65%, mild LVH, mild AR and mild TR.  Devon Mathews had a sleep study in 2014 that demonstrated moderate OSA. He continues to be compliant with CPAP.     Devon Mathews had an NSTEMI 08/2018.  Troponin peaked at 0.15.  He underwent LHC and had a 95% mid LAD lesion that was successfully stented with a DES.  20% distal left main disease was also noted.  LVEF was 65% on left ventriculogram.  Simvastatin was switched to atorvastatin and metoprolol was increased to 50 mg daily.  Plans were made to continue dual antiplatelet therapy for 1 to 3 months followed by ticagrelor alone.  Since his last appointment Devon Mathews, [place] and reported angina.  He was referred for [place] 12/01/2018 where there was concern for reversible ischemia in the  anterolateral region and infarct with peri-infarct ischemia in the inferolateral and inferoseptal regions.  He subsequently underwent left heart catheterization 12/05/2018 that revealed a 90% ostial D1 lesion.  He underwent successful balloon angioplasty.  However the procedure was complicated by a diagonal wire perforation with subsequent development of a pericardial effusion and tamponade.  He developed hemorrhagic pericarditis and was started on colchicine.  He was discharged but developed A worsening effusion and early tamponade was noted on his echo 12/2018.  He was admitted and underwent pericardial window.  During that time he also developed atrial fibrillation and had a TIA. His torsemide was increased because of volume overload. He followed up with Devon lawrence, DNP 02/2021 and his weight was still increasing. Torsemide was again increased. Repeat Echo 03/2021 revealed LVEF 60-65% with grade 1 diastolic dysfunction. BNP had been normal.  Devon Mathews was taking torsemide once every other day. He had returned to the lower dose after developing some kidney pain when taking extra torsemide. 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 He reported typical home blood pressures in the 110s, with rare 90s or 120s. He had one episode of severe fatigue and found his BP to be in the 46K systolic.  At his last appointment he was doing well.  He has been upset lately because his dog of 12 years passed a few days ago.  He was working with  [facility].  His weight was down to the 150s.  He notes that is breathing has worsened since gaining the weight back.  He plans to get back on the exercise program.  He has no exertional chest pain.  He does have shortness of breath with exertion but he thinks this is stable and due to his weight gain.   Past Medical History:  Diagnosis Date   Anxiety    Atrial fibrillation (HCC)    CAD S/P percutaneous coronary  angioplasty 09/13/2018    99% p-mLAD (BTW d1&d2) -> SYNERGY DES 3.5 X 12 (3.75 mm). 20% LM.  Cx, small RI & co-dom RCA normal.  EF 65%.    Chronic diastolic heart failure ([place]) 11/30/2019   Congestive heart failure (CHF) ([place])    Diabetes mellitus without complication ([place])    Dizziness 2012   normal findings..   Essential hypertension 07/28/2016   GERD (gastroesophageal reflux disease)    Hyperlipidemia    Lower extremity edema 07/28/2016   NSTEMI (non-ST elevated myocardial infarction) ([place]) 09/13/2018   The patient presented 09/13/2018 with a non-ST elevation MI, troponin peak was 0.15   Obesity    Obesity (BMI 30-39.9) 07/28/2016   OSA (obstructive sleep apnea) 07/28/2016   Restless leg syndrome    mild   SCCA (squamous cell carcinoma) of skin 08/24/2021   Left Forehead (in situ-inflitrating)   Shortness of breath 07/28/2016   Sleep apnea    Squamous cell carcinoma of skin 03/20/2015   right ant scalp(curetx3, 79f)   Squamous cell carcinoma of skin 01/01/2016   left lateral forehead (curetx3, 522f   Squamous cell carcinoma of skin 06/15/2019   left mid anterior scalp (tx afte bx)   Squamous cell carcinoma of skin 01/08/2021   well diff- left forehead   Squamous cell carcinoma of skin 01/08/2021   in situ- mid frontal scalp   TIA (transient ischemic attack)    Tubular adenoma    Past Surgical History:  Procedure Laterality Date   COLONOSCOPY     CORONARY BALLOON ANGIOPLASTY N/A 12/05/2018   Procedure: CORONARY BALLOON ANGIOPLASTY;  Surgeon: HaLeonie ManMD;  [facility];  Service: Cardiovascular;  Laterality: N/A;   CORONARY STENT INTERVENTION N/A 09/13/2018   Procedure: CORONARY STENT INTERVENTION;  Surgeon: SmBelva CromeMD;  [facility];  Service: Cardiovascular;; p-m LAD (btw D1&D2) - SYNERGY DES 3.5 x 12 (3.75 mm).   ESOPHAGOGASTRODUODENOSCOPY (EGD) WITH PROPOFOL N/A 03/01/2022   Procedure: ESOPHAGOGASTRODUODENOSCOPY (EGD) WITH  PROPOFOL;  Surgeon: GeGatha MayerMD;  [facility];  Service: Gastroenterology;  Laterality: N/A;  Polypectomy   GASTRIC VARICES BANDING  03/01/2022   Procedure: GASTRIC  BANDING;  Surgeon: GeGatha MayerMD;  [facility];  Service: Gastroenterology;;   KNEE ARTHROSCOPY Right    LEFT HEART CATH AND CORONARY ANGIOGRAPHY N/A 09/13/2018   Procedure: LEFT HEART CATH AND CORONARY ANGIOGRAPHY;  Surgeon: SmBelva CromeMD;  [facility];  Service: Cardiovascular;;  99% p-mLAD (BTW d1&d2) -> SYNERGY DES 3.5 X 12 (3.75 mm). 20% LM.  Cx, small RI & co-dom RCA normal.  EF 65%.    LEFT HEART CATH AND CORONARY ANGIOGRAPHY  2003   30-40% proximal segmental stenosis in the LAD   LEFT HEART CATH AND CORONARY ANGIOGRAPHY  2006   EF >50% & no mitral regurgitation   LEFT HEART CATH AND CORONARY ANGIOGRAPHY N/A 12/05/2018   Procedure: LEFT HEART  CATH AND CORONARY ANGIOGRAPHY;  Surgeon: [name], MD;  Location: [facility];  Service: Cardiovascular;  Laterality: N/A;   NM MYOVIEW LTD  07/2016   Normal.  EF 55-65% 63%).  No ischemia or infarction.  LOW RISK   NM MYOVIEW LTD  11/2018   6: 34 min.  7.2 METS.  Noted chest pain and tightness.  Horizontal ST of T wave depression (2 mm) II, 3, V5 and V6 (HIGH RISK), large/severe defect basal-apical inferior, inferolateral wall.  Large-severe basal-apical anterior-anterolateral wall.  Medium/moderate defect in basal and mid inferoseptal wall.  Consistent with large prior infarct with peri-infarct ischemia.  HIGH RISK   PERICARDIOCENTESIS N/A 12/05/2018   Procedure: PERICARDIOCENTESIS;  Surgeon: [name], MD;  Location: [facility];  Service: Cardiovascular;  Laterality: N/A;   SUBXYPHOID PERICARDIAL WINDOW N/A 12/25/2018   Procedure: SUBXYPHOID PERICARDIAL WINDOW;  Surgeon: [name], MD;  [facility];  Service: Thoracic;  Laterality: N/A;   TEE WITHOUT CARDIOVERSION N/A 12/25/2018    Procedure: TRANSESOPHAGEAL ECHOCARDIOGRAM (TEE);  Surgeon: [name], MD;  Location[facility] OR;  Service: Thoracic;  Laterality: N/A;   tendon detachment Right    arm   TRANSTHORACIC ECHOCARDIOGRAM  07/2016   EF 60-65% with mild LVH.  Mild AI.  Trivial MR and TR.   VASECTOMY       Current Meds  Medication Sig   acetaminophen (TYLENOL) 500 MG tablet Take 1,000 mg by mouth every 6 (six) hours as needed for moderate pain.   amitriptyline (ELAVIL) 25 MG tablet Take 0.5 tablets (12.5 mg total) by mouth at bedtime as needed for sleep.   amLODipine (NORVASC) 2.5 MG tablet Take 1 tablet (2.5 mg total) by mouth daily.   atorvastatin (LIPITOR) 80 MG tablet TAKE 1 TABLET BY MOUTH EVERY DAY   Cholecalciferol (VITAMIN D) 125 MCG (5000 UT) CAPS Take 5,000 Units by mouth daily.   clopidogrel (PLAVIX) 75 MG tablet TAKE 1 TABLET BY MOUTH EVERY DAY   ELIQUIS 5 MG TABS tablet Take 1 tablet (5 mg total) by mouth 2 (two) times daily.   escitalopram (LEXAPRO) 10 MG tablet Take 10 mg by mouth daily.   losartan (COZAAR) 50 MG tablet Take 1 tablet (50 mg total) by mouth daily.   metoprolol succinate (TOPROL-XL) 50 MG 24 hr tablet TAKE 1 TABLET BY MOUTH EVERY DAY WITH OR IMMEDIATELY FOLLOWING A MEAL   nitroGLYCERIN (NITROSTAT) 0.4 MG SL tablet Place 1 tablet (0.4 mg total) under the tongue every 5 (five) minutes as needed for chest pain.   pantoprazole (PROTONIX) 40 MG tablet TAKE ONE TABLET BY MOUTH ONE TIME DAILY. must make follow up appointment for further refills   Semaglutide,0.25 or 0.'5MG'$ /DOS, (OZEMPIC, 0.25 OR 0.5 MG/DOSE,) 2 MG/3ML SOPN Inject 0.25 mg into the skin once a week. SQ     Allergies:   Penicillins   Social History   Tobacco Use   Smoking status: Never   Smokeless tobacco: Never  Vaping Use   Vaping Use: Never used  Substance Use Topics   Alcohol use: No   Drug use: No     Family Hx: The patient's family history includes COPD in his father; Diabetes in his brother, mother, and  sister; Heart attack in his paternal grandmother and sister; Heart disease in his father and mother; Heart failure in his father, mother, and sister; Hypertension in his mother; Other in his father and maternal grandfather; Pancreatic cancer in his paternal aunt  and paternal uncle; Stroke in his father. There is no history of Colon cancer, Esophageal cancer, Stomach cancer, or Rectal cancer.  ROS:   Please see the history of present illness.    (+) Right knee pain and stiffness (+) Left hip pain (+) Hernia (+) Cough All other systems reviewed and are negative.   Prior CV studies:   The following studies were reviewed today:  [place] Myoview 12/01/18: Nuclear stress EF: 54%. The left ventricular ejection fraction is mildly decreased (45-54%). Horizontal ST segment depression ST segment depression of 2 mm was noted during stress in the II, III, V5 and V6 leads, beginning at 8 minutes of stressST deviation beginning in recovery. Defect 1: There is a large defect of severe severity present in the basal inferior, basal inferolateral, mid inferior, mid inferolateral, apical inferior, apical lateral and apex location. Defect 2: There is a large defect of severe severity present in the basal anterior, basal anterolateral, mid anterior, mid anterolateral, apical anterior and apex location. Defect 3: There is a medium defect of moderate severity present in the basal inferoseptal and mid inferoseptal location. Findings consistent with ischemia and prior myocardial infarction with peri-infarct ischemia. This is a high risk study.   There is a medium size, severe perfusion defect in the inferior and inferolateral walls with stress that is partially reversible at rest, returning to moderate. Base to apex. There is a absent, large perfusion defect in the anterior wall from base to apex. It is partially reversible at rest and returns to moderate at the base and mid ventricle and returns to mild at the apex.  Anterolateral defect is severe, and with full reversibility. Medium size, moderate perfusion defect in the inferoseptum with partial reversibility, returning to mild at rest.   LHC 12/05/18: SUMMARY Culprit lesion:  90% ostial 1st Diag (jailed) -successful scoring balloon angioplasty using 2.5 mm Wolverine scoring balloon --Distal diagonal wire perforation with pericardial effusion and initial stages of pericardial tamponade --> successful occlusion with prolonged balloon inflations and administration of protamine --Unsuccessful pericardiocentesis Widely patent LAD stent Otherwise stable coronary arteries. Normal LVEF and EDP pre-PCI Clear ST elevations for roughly 15 minutes, most likely type for MI.    Echo 12/06/18: IMPRESSIONS      1. The left ventricle has normal systolic function, with an ejection fraction of 55-60%. The cavity size was normal. Left ventricular diastolic parameters were normal.  2. Small pericardial effusion seen at the apex, lateral wall and anterior to RV.  3. The mitral valve is degenerative. Mild thickening of the mitral valve leaflet. Mild calcification of the mitral valve leaflet.  4. The aortic valve is tricuspid There is Moderate thickening of the aortic valve There is Mild calcification of the aortic valveAortic valve regurgitation is trivial by color flow Doppler.  Echo 12/21/18:   IMPRESSIONS    1. The left ventricle has hyperdynamic systolic function, with an ejection fraction of >65%. Left ventricular diastology could not be evaluated due to nondiagnostic images.  2. The mitral valve is normal in structure.  3. Aortic valve regurgitation is mild by color flow Doppler.  4. Moderate pericardial effusion.  5. The pericardial effusion is circumferential.  6. There is inversion of the right ventricular wall, inversion of the right atrial wall and excessive respiratory variation in the mitral valve spectral Doppler velocities.  7. When compared to the prior  study: Compared to study 11/2018, the pericardial effusion has incrased in size and now significant effusion present posterior as well as  anteriorly. There is RA inversion and suggestive of subtle intermittent RV diastolic  collapse. There is some respiratory variation in the [facility] inlow pattern with respirations. The IVC is not well visualized. Concern for early tamponade.     Carotid Doppler 12/21/18: Minimal plaque bilaterally.  CT Angio Chest Aorta 07/10/2020: 1. Negative for acute PE or thoracic aortic dissection. 2. Dilated central pulmonary arteries suggesting pulmonary arterial hypertension. 3. Coronary calcifications. The severity of coronary artery disease and any potential stenosis cannot be assessed on this non-gated CT examination.  Echo 03/27/2021:  1. Left ventricular ejection fraction, by estimation, is 60 to 65%. The  left ventricle has normal function. The left ventricle has no regional  wall motion abnormalities. Left ventricular diastolic parameters were  normal.   2. Right ventricular systolic function is normal. The right ventricular  size is moderately enlarged. There is normal pulmonary artery systolic  pressure. The estimated right ventricular systolic pressure is 43.3 mmHg.   3. The mitral valve is normal in structure. Trivial mitral valve  regurgitation. No evidence of mitral stenosis.   4. The aortic valve is tricuspid. Aortic valve regurgitation is trivial.  No aortic stenosis is present.   5. The inferior vena cava is normal in size with greater than 50%  respiratory variability, suggesting right atrial pressure of 3 mmHg.   Comparison(s): No significant change from prior study.   Labs/Other Tests and Data Reviewed:    EKG:  EKG is personally reviewed. 01/07/2022: Sinus rhythm. Rate 68 bpm. 05/01/2021: Sinus rhythm. Rate 62 bpm. 1st degree AV block. 12/30/2020: Sinus rhythm. PACs. Rate 70 bpm.   Recent Labs: 09/21/2021: TSH 5.530 01/07/2022: ALT 16; BUN 15;  Creatinine, Ser 1.11; Potassium 4.5; Sodium 140   Recent Lipid Panel Lab Results  Component Value Date/Time   CHOL 87 (L) 01/07/2022 09:10 AM   TRIG 52 01/07/2022 09:10 AM   HDL 35 (L) 01/07/2022 09:10 AM   CHOLHDL 2.5 01/07/2022 09:10 AM   CHOLHDL 3.0 12/21/2018 05:28 AM   LDLCALC 39 01/07/2022 09:10 AM    Wt Readings from Last 3 Encounters:  05/24/22 273 lb 6.4 oz (124 kg)  05/19/22 270 lb (122.5 kg)  04/27/22 265 lb (120.2 kg)     Objective:    VS:  BP 122/62 (BP Location: Right Arm, Patient Position: Sitting, Cuff Size: Large)   Pulse 61   Ht '5\' 9"'$  (1.753 m)   Wt 273 lb 6.4 oz (124 kg)   SpO2 97%   BMI 40.37 kg/m  , BMI Body mass index is 40.37 kg/m. GENERAL:  Well appearing HEENT: Pupils equal round and reactive, fundi not visualized, oral mucosa unremarkable NECK:  No JVD.  Waveform within normal limits, carotid upstroke brisk and symmetric, no bruits LUNGS:  Clear to auscultation bilaterally HEART:  RRR.  PMI not displaced or sustained,S1 and S2 within normal limits, no S3, no S4, no clicks, no rubs, no murmurs ABD:  Flat, positive bowel sounds normal in frequency in pitch, no bruits, no rebound, no guarding, no midline pulsatile mass, no hepatomegaly, no splenomegaly. Umbilical hernia EXT:  2 plus pulses throughout, 1+ bilateral LE edema, no cyanosis no clubbing SKIN:  No rashes no nodules NEURO:  Cranial nerves II through XII grossly intact, motor grossly intact throughout PSYCH:  Cognitively intact, oriented to person place and time   ASSESSMENT & PLAN:    Chronic diastolic heart failure (HCC) Volume status is stable.  He has very mild lower extremity edema.  Blood pressure  is well-controlled.  Continue amlodipine, losartan, and metoprolol.  Encouraged him to work on increasing his exercise.  Essential hypertension Blood pressure is well-controlled on amlodipine, losartan, and metoprolol.  Referred him to the PREP program at the [facility].  PAF (paroxysmal atrial  fibrillation) (HCC) No recent episodes.  Continue metoprolol and Eliquis.  OSA (obstructive sleep apnea) Continue CPAP.  Obesity (BMI 30-39.9) Working on weight loss as above.  Recommended that he increase to the next dose of Ozempic at 0.5 mg weekly which is already at the pharmacy for him.    Medication Adjustments/Labs and Tests Ordered: Current medicines are reviewed at length with the patient today.  Concerns regarding medicines are outlined above.   Tests Ordered: Orders Placed This Encounter  Procedures   Amb Referral To Provider Referral Exercise Program (P.R.E.P)   Medication Changes: No orders of the defined types were placed in this encounter.  Disposition:   Follow up with Devon Kuster C. Oval [name], MD, [facility] in 6 months.    Signed, Devon Latch, MD  05/24/2022 1:09 PM    [place]

## 2022-05-24 NOTE — Patient Instructions (Signed)
Medication Instructions:  Your physician recommends that you continue on your current medications as directed. Please refer to the Current Medication list given to you today.   *If you need a refill on your cardiac medications before your next appointment, please call your pharmacy*  Lab Work: NONE  Testing/Procedures: NONE  Follow-Up: At [facility], you and your health needs are our priority.  As part of our continuing mission to provide you with exceptional heart care, we have created designated Provider Care Teams.  These Care Teams include your primary Cardiologist (physician) and Advanced Practice Providers (APPs -  Physician Assistants and Nurse Practitioners) who all work together to provide you with the care you need, when you need it.  We recommend signing up for the patient portal called "MyChart".  Sign up information is provided on this After Visit Summary.  MyChart is used to connect with patients for Virtual Visits (Telemedicine).  Patients are able to view lab/test results, encounter notes, upcoming appointments, etc.  Non-urgent messages can be sent to your provider as well.   To learn more about what you can do with MyChart, go to [email or web address].    Your next appointment:   6 month(s)  The format for your next appointment:   In Person  Provider:   Skeet Latch, MD  Other Instructions PAM OR LORA FROM PREP ([facility]) PROGRAM WILL BE IN [facility]

## 2022-05-24 NOTE — Assessment & Plan Note (Signed)
Continue CPAP.  

## 2022-05-24 NOTE — Assessment & Plan Note (Signed)
Prior LAD PCI in the setting of NSTEMI in 2019.  He is doing well and has no angina.  Encouraged him to work on increasing his exercise.  Lipids are well-controlled.  Continue clopidogrel, atorvastatin, and metoprolol.

## 2022-05-24 NOTE — Progress Notes (Signed)
Chief Complaint:   OBESITY Devon Mathews is here to discuss his progress with his obesity treatment plan along with follow-up of his obesity related diagnoses. Devon Mathews is on the Category 3 Plan and states he is following his eating plan approximately 85% of the time. Devon Mathews states he is exercising 0 minutes 0 times per week.  Today's visit was #: 18 Starting weight: 284 lbs Starting date: 04/28/2021 Today's weight: 270 lbs Today's date: 05/19/2022 Total lbs lost to date: 14 lbs Total lbs lost since last in-office visit: 0  Interim History: Devon Mathews has not been traveling since his last visit. He has gotten off track. He started getting back on track last week. He has been eating more bread. Still meeting protein goals. Drinking water, unsweetened tea and sparkling water. He denies hunger or cravings.  Subjective:   1. Type 2 diabetes mellitus without complication, without long-term current use of insulin (HCC) Shelly is currently taking Ozempic 0.25 mg. Denies any side effects. Denies polyphagia. He does not check blood sugars at home. Last eye exam, June 2023. He is on a statin and ARB.  Assessment/Plan:   1. Type 2 diabetes mellitus without complication, without long-term current use of insulin (HCC) We will refill Ozempic 0.25 mg SubQ once a week for 1 month with 0 refills. Side effects discussed.  Good blood sugar control is important to decrease the likelihood of diabetic complications such as nephropathy, neuropathy, limb loss, blindness, coronary artery disease, and death. Intensive lifestyle modification including diet, exercise and weight loss are the first line of treatment for diabetes.    -Refill Semaglutide,0.25 or 0.'5MG'$ /DOS, (OZEMPIC, 0.25 OR 0.5 MG/DOSE,) 2 MG/3ML SOPN; Inject 0.25 mg into the skin once a week. SQ  Dispense: 3 mL; Refill: 0  2. Obesity with current BMI of 40.0 Devon Mathews is currently in the action stage of change. As such, his goal is to continue with weight loss efforts. He has  agreed to following a lower carbohydrate, vegetable and lean protein rich diet plan.   Exercise goals: All adults should avoid inactivity. Some physical activity is better than none, and adults who participate in any amount of physical activity gain some health benefits.  Handout given: Engineer, water. Devon Mathews had labs last week with PCP. He will send to me via Mychart to review.  Behavioral modification strategies: increasing lean protein intake, increasing vegetables, and increasing water intake.  Devon Mathews has agreed to follow-up with our clinic in 3 weeks. He was informed of the importance of frequent follow-up visits to maximize his success with intensive lifestyle modifications for his multiple health conditions.   Objective:   Blood pressure 133/63, pulse 60, temperature 98 F (36.7 C), height '5\' 9"'$  (1.753 m), weight 270 lb (122.5 kg), SpO2 94 %. Body mass index is 39.87 kg/m.  General: Cooperative, alert, well developed, in no acute distress. HEENT: Conjunctivae and lids unremarkable. Cardiovascular: Regular rhythm.  Lungs: Normal work of breathing. Neurologic: No focal deficits.   Lab Results  Component Value Date   CREATININE 1.11 01/07/2022   BUN 15 01/07/2022   NA 140 01/07/2022   K 4.5 01/07/2022   CL 107 (H) 01/07/2022   CO2 23 01/07/2022   Lab Results  Component Value Date   ALT 16 01/07/2022   AST 15 01/07/2022   ALKPHOS 72 01/07/2022   BILITOT 0.3 01/07/2022   Lab Results  Component Value Date   HGBA1C 5.6 09/21/2021   HGBA1C 6.2 04/17/2021   HGBA1C 6.9 (H)  03/09/2021   HGBA1C 5.5 12/21/2018   HGBA1C 5.2 09/14/2018   Lab Results  Component Value Date   INSULIN 32.1 (H) 09/21/2021   INSULIN 55.5 (H) 04/28/2021   Lab Results  Component Value Date   TSH 5.530 (H) 09/21/2021   Lab Results  Component Value Date   CHOL 87 (L) 01/07/2022   HDL 35 (L) 01/07/2022   LDLCALC 39 01/07/2022   TRIG 52 01/07/2022   CHOLHDL 2.5 01/07/2022   Lab Results   Component Value Date   VD25OH 77.0 09/21/2021   VD25OH 73.3 04/28/2021   Lab Results  Component Value Date   WBC 4.8 04/17/2021   HGB 12.8 (A) 04/17/2021   HCT 37 (A) 04/17/2021   MCV 98 (H) 06/25/2020   PLT 173 04/17/2021   No results found for: "IRON", "TIBC", "FERRITIN"  Attestation Statements:   Reviewed by clinician on day of visit: allergies, medications, problem list, medical history, surgical history, family history, social history, and previous encounter notes.   I, Brendell Tyus, RMA, am acting as transcriptionist for Everardo Pacific, FNP.  I have reviewed the above documentation for accuracy and completeness, and I agree with the above. Everardo Pacific, FNP

## 2022-05-24 NOTE — Assessment & Plan Note (Addendum)
Volume status is stable.  He has very mild lower extremity edema.  Blood pressure is well-controlled.  Continue amlodipine, losartan, and metoprolol.  Encouraged him to work on increasing his exercise.

## 2022-05-26 ENCOUNTER — Encounter (INDEPENDENT_AMBULATORY_CARE_PROVIDER_SITE_OTHER): Payer: Self-pay

## 2022-05-28 ENCOUNTER — Telehealth: Payer: Self-pay | Admitting: *Deleted

## 2022-05-28 NOTE — Telephone Encounter (Signed)
Called to offer PREP Class. Prefers [employer] [facility] for proximity. Class beginning 06/08/22 is currently full. Patient states he is able to attend should there be an opening. Has agreed to a class later in the fall if not. Will call back next week.

## 2022-05-31 ENCOUNTER — Telehealth: Payer: Self-pay | Admitting: *Deleted

## 2022-05-31 NOTE — Telephone Encounter (Signed)
Availability in August PREP Class,Tuesday/Thursday [id number] at the [facility]. Patient contacted and accepted. Class begins 06/08/2022. Intake assessment visit scheduled for 06/01/2022 '@1100'$ .

## 2022-06-01 ENCOUNTER — Encounter: Payer: Self-pay | Admitting: *Deleted

## 2022-06-01 ENCOUNTER — Encounter (INDEPENDENT_AMBULATORY_CARE_PROVIDER_SITE_OTHER): Payer: Self-pay | Admitting: Nurse Practitioner

## 2022-06-01 NOTE — Progress Notes (Signed)
[facility] PREP Evaluation  Patient Details  Name: Devon Mathews. MRN: [id number] Date of Birth: 22-Feb-1951 Age: 71 y.o. PCP: Haywood Pao, MD  Vitals:   06/01/22 1120  BP: 132/62  Pulse: 76  SpO2: 96%  Weight: 266 lb 9.6 oz (120.9 kg)  Height: '5\' 10"'$  (1.778 m)     [facility] Eval - 06/01/22 1500       [facility] "PREP" Location   [facility] "PREP" Location [place]      Referral    Referring Provider Oval [name]    Reason for referral Hypertension;Inactivity;Obesitity/Overweight    Program Start Date 06/08/22      Measurement   Waist Circumference 52.5 inches    Hip Circumference 48 inches    Body fat 37.1 percent      Information for Trainer   Goals weight loss 10-12 lbs, establish exercise routine,increase endurance/stamina    Current Exercise none    Orthopedic Concerns Left hip arthritis, right knee discomfort, right shoulder old injury    Current Barriers none    Medications that affect exercise Beta blocker      Mobility and Daily Activities   I find it easy to walk up or down two or more flights of stairs. 1    I have no trouble taking out the trash. 3    I do housework such as vacuuming and dusting on my own without difficulty. 4    I can easily lift a gallon of milk (8lbs). 4    I can easily walk a mile. 1    I have no trouble reaching into high cupboards or reaching down to pick up something from the floor. 2    I do not have trouble doing out-door work such as Armed forces logistics/support/administrative officer, raking leaves, or gardening. 4      Mobility and Daily Activities   I feel younger than my age. 4    I feel independent. 4    I feel energetic. 3    I live an active life.  3    I feel strong. 2    I feel healthy. 4    I feel active as other people my age. 4      How fit and strong are you.   Fit and Strong Total Score 43            Past Medical History:  Diagnosis Date   Anxiety    Atrial fibrillation (HCC)    CAD S/P percutaneous coronary angioplasty 09/13/2018    99%  p-mLAD (BTW d1&d2) -> SYNERGY DES 3.5 X 12 (3.75 mm). 20% LM.  Cx, small RI & co-dom RCA normal.  EF 65%.    Chronic diastolic heart failure ([place]) 11/30/2019   Congestive heart failure (CHF) ([place])    Diabetes mellitus without complication ([place])    Dizziness 2012   normal findings..   Essential hypertension 07/28/2016   GERD (gastroesophageal reflux disease)    Hyperlipidemia    Lower extremity edema 07/28/2016   NSTEMI (non-ST elevated myocardial infarction) ([place]) 09/13/2018   The patient presented 09/13/2018 with a non-ST elevation MI, troponin peak was 0.15   Obesity    Obesity (BMI 30-39.9) 07/28/2016   OSA (obstructive sleep apnea) 07/28/2016   Restless leg syndrome    mild   SCCA (squamous cell carcinoma) of skin 08/24/2021   Left Forehead (in situ-inflitrating)   Shortness of breath 07/28/2016   Sleep apnea    Squamous cell carcinoma of skin 03/20/2015  right ant scalp(curetx3, 69f)   Squamous cell carcinoma of skin 01/01/2016   left lateral forehead (curetx3, 575f   Squamous cell carcinoma of skin 06/15/2019   left mid anterior scalp (tx afte bx)   Squamous cell carcinoma of skin 01/08/2021   well diff- left forehead   Squamous cell carcinoma of skin 01/08/2021   in situ- mid frontal scalp   TIA (transient ischemic attack)    Tubular adenoma    Past Surgical History:  Procedure Laterality Date   COLONOSCOPY     CORONARY BALLOON ANGIOPLASTY N/A 12/05/2018   Procedure: CORONARY BALLOON ANGIOPLASTY;  Surgeon: HaLeonie ManMD;  [facility];  Service: Cardiovascular;  Laterality: N/A;   CORONARY STENT INTERVENTION N/A 09/13/2018   Procedure: CORONARY STENT INTERVENTION;  Surgeon: SmBelva CromeMD;  [facility];  Service: Cardiovascular;; p-m LAD (btw D1&D2) - SYNERGY DES 3.5 x 12 (3.75 mm).   ESOPHAGOGASTRODUODENOSCOPY (EGD) WITH PROPOFOL N/A 03/01/2022   Procedure: ESOPHAGOGASTRODUODENOSCOPY (EGD) WITH PROPOFOL;  Surgeon: GeGatha MayerMD;  [facility];  Service: Gastroenterology;  Laterality: N/A;  Polypectomy   GASTRIC VARICES BANDING  03/01/2022   Procedure: GASTRIC  BANDING;  Surgeon: GeGatha MayerMD;  [facility];  Service: Gastroenterology;;   KNEE ARTHROSCOPY Right    LEFT HEART CATH AND CORONARY ANGIOGRAPHY N/A 09/13/2018   Procedure: LEFT HEART CATH AND CORONARY ANGIOGRAPHY;  Surgeon: SmBelva CromeMD;  [facility];  Service: Cardiovascular;;  99% p-mLAD (BTW d1&d2) -> SYNERGY DES 3.5 X 12 (3.75 mm). 20% LM.  Cx, small RI & co-dom RCA normal.  EF 65%.    LEFT HEART CATH AND CORONARY ANGIOGRAPHY  2003   30-40% proximal segmental stenosis in the LAD   LEFT HEART CATH AND CORONARY ANGIOGRAPHY  2006   EF >50% & no mitral regurgitation   LEFT HEART CATH AND CORONARY ANGIOGRAPHY N/A 12/05/2018   Procedure: LEFT HEART CATH AND CORONARY ANGIOGRAPHY;  Surgeon: HaLeonie ManMD;  [facility];  Service: Cardiovascular;  Laterality: N/A;   NM MYOVIEW LTD  07/2016   Normal.  EF 55-65% 63%).  No ischemia or infarction.  LOW RISK   NM MYOVIEW LTD  11/2018   6: 34 min.  7.2 METS.  Noted chest pain and tightness.  Horizontal ST of T wave depression (2 mm) II, 3, V5 and V6 (HIGH RISK), large/severe defect basal-apical inferior, inferolateral wall.  Large-severe basal-apical anterior-anterolateral wall.  Medium/moderate defect in basal and mid inferoseptal wall.  Consistent with large prior infarct with peri-infarct ischemia.  HIGH RISK   PERICARDIOCENTESIS N/A 12/05/2018   Procedure: PERICARDIOCENTESIS;  Surgeon: HaLeonie ManMD;  [facility];  Service: Cardiovascular;  Laterality: N/A;   SUBXYPHOID PERICARDIAL WINDOW N/A 12/25/2018   Procedure: SUBXYPHOID PERICARDIAL WINDOW;  Surgeon: BaGaye PollackMD;  [facility];  Service: Thoracic;  Laterality: N/A;   TEE WITHOUT CARDIOVERSION N/A 12/25/2018   Procedure: TRANSESOPHAGEAL  ECHOCARDIOGRAM (TEE);  Surgeon: BaGaye PollackMD;  [facility];  Service: Thoracic;  Laterality: N/A;   tendon detachment Right    arm   TRANSTHORACIC ECHOCARDIOGRAM  07/2016   EF 60-65% with mild LVH.  Mild AI.  Trivial MR and TR.   VASECTOMY     Social History   Tobacco Use  Smoking Status Never  Smokeless Tobacco Never    Ta[place]ris Cross/15/2023, 3:38 PM  To start PREP Class at [facility] on 06/08/22 Tuesday/Thursdays [id number].

## 2022-06-04 ENCOUNTER — Other Ambulatory Visit (INDEPENDENT_AMBULATORY_CARE_PROVIDER_SITE_OTHER): Payer: Self-pay | Admitting: Nurse Practitioner

## 2022-06-04 DIAGNOSIS — E119 Type 2 diabetes mellitus without complications: Secondary | ICD-10-CM

## 2022-06-10 ENCOUNTER — Encounter (INDEPENDENT_AMBULATORY_CARE_PROVIDER_SITE_OTHER): Payer: Self-pay | Admitting: Nurse Practitioner

## 2022-06-10 ENCOUNTER — Ambulatory Visit (INDEPENDENT_AMBULATORY_CARE_PROVIDER_SITE_OTHER): Payer: Medicare HMO | Admitting: Nurse Practitioner

## 2022-06-10 VITALS — BP 124/72 | HR 67 | Temp 98.6°F | Ht 70.0 in | Wt 265.0 lb

## 2022-06-10 DIAGNOSIS — E669 Obesity, unspecified: Secondary | ICD-10-CM

## 2022-06-10 DIAGNOSIS — Z7985 Long-term (current) use of injectable non-insulin antidiabetic drugs: Secondary | ICD-10-CM

## 2022-06-10 DIAGNOSIS — E119 Type 2 diabetes mellitus without complications: Secondary | ICD-10-CM | POA: Diagnosis not present

## 2022-06-10 DIAGNOSIS — Z6838 Body mass index (BMI) 38.0-38.9, adult: Secondary | ICD-10-CM

## 2022-06-10 MED ORDER — OZEMPIC (0.25 OR 0.5 MG/DOSE) 2 MG/3ML ~~LOC~~ SOPN: 0.5000 mg | PEN_INJECTOR | SUBCUTANEOUS | 0 refills | Status: DC

## 2022-06-15 ENCOUNTER — Encounter: Payer: Self-pay | Admitting: *Deleted

## 2022-06-15 NOTE — Progress Notes (Signed)
[facility] PREP Weekly Session  Patient Details  Name: Devon Mathews. MRN: [id number] Date of Birth: 01-Dec-1950 Age: 71 y.o. PCP: Haywood Pao, MD  Vitals:   06/15/22 1400  Weight: 272 lb 3.2 oz (123.5 kg)     [facility] Weekly seesion - 06/15/22 1500       [facility] "PREP" Location   [facility] "PREP" Location [facility]      Weekly Session   Topic Discussed Importance of resistance training;Other ways to be active   [facility] class schedule, water intake 1/2 body weight in oz or 64oz per day.   Minutes exercised this week 25 minutes    Classes attended to date [place], [place] 06/15/2022, 4:25 PM

## 2022-06-17 NOTE — Progress Notes (Signed)
Chief Complaint:   OBESITY Devon Mathews is here to discuss his progress with his obesity treatment plan along with follow-up of his obesity related diagnoses. Devon Mathews is on the Category 3 Plan and states he is following his eating plan approximately 85% of the time. Devon Mathews states he is not exercising, but will start the gym soon.  Today's visit was #: 82 Starting weight: 284 lbs Starting date: 04/28/2021 Today's weight: 265 lbs Today's date: 06/10/2022 Total lbs lost to date: 19 lbs Total lbs lost since last in-office visit: 5 lbs  Interim History: Has done well with weight loss since his last visit.  Making healthier choices and being more active.  Plans to join a 12 week program at the [facility].  Denies hunger and cravings.  Drinking water and ICE drinks.    Subjective:   1. Type 2 diabetes mellitus without complication, without long-term current use of insulin (HCC) Taking Ozempic 0.5 mg once weekly.  Denies side effects.  Last A1c 5.8 on 05/07/2022.  She does not check blood sugars at home.  She denies hypoglycemia.  She is on a statin and ARB's. Last eye exam June 2023.   Assessment/Plan:   1. Type 2 diabetes mellitus without complication, without long-term current use of insulin (HCC)  Continue Ozempic 0.'5mg'$ .  SE discussed.   Good blood sugar control is important to decrease the likelihood of diabetic complications such as nephropathy, neuropathy, limb loss, blindness, coronary artery disease, and death. Intensive lifestyle modification including diet, exercise and weight loss are the first line of treatment for diabetes.    Refill - Semaglutide,0.25 or 0.'5MG'$ /DOS, (OZEMPIC, 0.25 OR 0.5 MG/DOSE,) 2 MG/3ML SOPN; Inject 0.5 mg into the skin once a week. SQ  Dispense: 3 mL; Refill: 0  2. Obesity with current BMI of 38.1 Devon Mathews is currently in the action stage of change. As such, his goal is to continue with weight loss efforts. He has agreed to the Category 3 Plan.   Exercise goals: All adults  should avoid inactivity. Some physical activity is better than none, and adults who participate in any amount of physical activity gain some health benefits.  Behavioral modification strategies: no skipping meals, meal planning and cooking strategies, and planning for success.  Devon Mathews has agreed to follow-up with our clinic in 3-4 weeks. He was informed of the importance of frequent follow-up visits to maximize his success with intensive lifestyle modifications for his multiple health conditions.   Objective:   Blood pressure 124/72, pulse 67, temperature 98.6 F (37 C), height '5\' 10"'$  (1.778 m), weight 265 lb (120.2 kg), SpO2 95 %. Body mass index is 38.02 kg/m.  General: Cooperative, alert, well developed, in no acute distress. HEENT: Conjunctivae and lids unremarkable. Cardiovascular: Regular rhythm.  Lungs: Normal work of breathing. Neurologic: No focal deficits.   Lab Results  Component Value Date   CREATININE 1.11 01/07/2022   BUN 19 05/07/2022   NA 139 05/07/2022   K 4.9 05/07/2022   CL 109 (A) 05/07/2022   CO2 28 (A) 05/07/2022   Lab Results  Component Value Date   ALT 16 01/07/2022   AST 15 01/07/2022   ALKPHOS 72 01/07/2022   BILITOT 0.3 01/07/2022   Lab Results  Component Value Date   HGBA1C 5.6 09/21/2021   HGBA1C 6.2 04/17/2021   HGBA1C 6.9 (H) 03/09/2021   HGBA1C 5.5 12/21/2018   HGBA1C 5.2 09/14/2018   Lab Results  Component Value Date   INSULIN 32.1 (H) 09/21/2021  INSULIN 55.5 (H) 04/28/2021   Lab Results  Component Value Date   TSH 5.530 (H) 09/21/2021   Lab Results  Component Value Date   CHOL 101 05/07/2022   HDL 47 05/07/2022   LDLCALC 40 05/07/2022   TRIG 68 05/07/2022   CHOLHDL 2.5 01/07/2022   Lab Results  Component Value Date   VD25OH 77.0 09/21/2021   VD25OH 73.3 04/28/2021   Lab Results  Component Value Date   WBC 4.6 05/07/2022   HGB 12.6 (A) 05/07/2022   HCT 37 (A) 05/07/2022   MCV 98 (H) 06/25/2020   PLT 163 05/07/2022    No results found for: "IRON", "TIBC", "FERRITIN"  Obesity Behavioral Intervention:   Approximately 15 minutes were spent on the discussion below.  ASK: We discussed the diagnosis of obesity with Devon Mathews today and Devon Mathews agreed to give [place] permission to discuss obesity behavioral modification therapy today.  ASSESS: Devon Mathews has the diagnosis of obesity and his BMI today is 38.1. Devon Mathews is in the action stage of change.   ADVISE: Devon Mathews was educated on the multiple health risks of obesity as well as the benefit of weight loss to improve his health. He was advised of the need for long term treatment and the importance of lifestyle modifications to improve his current health and to decrease his risk of future health problems.  AGREE: Multiple dietary modification options and treatment options were discussed and Devon Mathews agreed to follow the recommendations documented in the above note.  ARRANGE: Devon Mathews was educated on the importance of frequent visits to treat obesity as outlined per CMS and USPSTF guidelines and agreed to schedule his next follow up appointment today.  Attestation Statements:   Reviewed by clinician on day of visit: allergies, medications, problem list, medical history, surgical history, family history, social history, and previous encounter notes.  I, Davy Pique, RMA, am acting as transcriptionist for Everardo Pacific, FNP  I have reviewed the above documentation for accuracy and completeness, and I agree with the above. Everardo Pacific, FNP

## 2022-06-22 ENCOUNTER — Encounter: Payer: Self-pay | Admitting: *Deleted

## 2022-06-22 NOTE — Progress Notes (Signed)
[facility] PREP Weekly Session  Patient Details  Name: Devon Mathews. MRN: [id number] Date of Birth: 06/22/1951 Age: 71 y.o. PCP: Haywood Pao, MD  Vitals:   06/22/22 1637  Weight: 278 lb 6.4 oz (126.3 kg)     [facility] Weekly seesion - 06/22/22 1600       [facility] "PREP" Location   [facility]PREP[facility] Location [facility]      Weekly Session   Topic Discussed Healthy eating tips   Sodium intake 1500-2300 mg/day, added sugars 24 gm/day for women 36gm/day for men, YUKA app.   Minutes exercised this week 45 minutes    Classes attended to date [place], [place] 06/22/2022, 4:39 PM

## 2022-06-29 ENCOUNTER — Encounter: Payer: Self-pay | Admitting: *Deleted

## 2022-06-29 NOTE — Progress Notes (Signed)
[facility] PREP Weekly Session  Patient Details  Name: Devon Mathews. MRN: [id number] Date of Birth: 03-30-1951 Age: 71 y.o. PCP: Haywood Pao, MD  Vitals:   06/29/22 1638  Weight: 273 lb 9.6 oz (124.1 kg)     [facility] Weekly seesion - 06/29/22 1600       [facility] "PREP" Location   [facility] "PREP" Location [place]      Weekly Session   Topic Discussed Health habits;Water   Water: 1/2 body weight in oz's per day or at least 64oz/day (if not on fluid restriction). Sugar demo.   Minutes exercised this week 185 minutes    Classes attended to date [place], [place] 06/29/2022, 4:40 PM

## 2022-07-01 ENCOUNTER — Ambulatory Visit (INDEPENDENT_AMBULATORY_CARE_PROVIDER_SITE_OTHER): Payer: Medicare HMO | Admitting: Nurse Practitioner

## 2022-07-06 ENCOUNTER — Ambulatory Visit (INDEPENDENT_AMBULATORY_CARE_PROVIDER_SITE_OTHER): Payer: Medicare HMO | Admitting: Nurse Practitioner

## 2022-07-06 ENCOUNTER — Encounter: Payer: Self-pay | Admitting: *Deleted

## 2022-07-06 NOTE — Progress Notes (Signed)
[facility] PREP Weekly Session  Patient Details  Name: Devon Mathews. MRN: [id number] Date of Birth: 06-Nov-1950 Age: 71 y.o. PCP: Haywood Pao, MD  Vitals:   07/06/22 1631  Weight: 270 lb 6.4 oz (122.7 kg)     [facility] Weekly seesion - 07/06/22 1600       [facility] Location   [facility]      Weekly Session   Topic Discussed Eating for the season;Restaurant Eating   Salt intake 1500-'2300mg'$ /day. Salt demo, label reading.   Minutes exercised this week 82 minutes    Classes attended to date [place], [place] 07/06/2022, 4:34 PM

## 2022-07-12 ENCOUNTER — Ambulatory Visit (INDEPENDENT_AMBULATORY_CARE_PROVIDER_SITE_OTHER): Payer: Medicare HMO | Admitting: Family Medicine

## 2022-07-12 VITALS — BP 113/65 | HR 58 | Temp 98.5°F | Ht 70.0 in | Wt 265.0 lb

## 2022-07-12 DIAGNOSIS — I2510 Atherosclerotic heart disease of native coronary artery without angina pectoris: Secondary | ICD-10-CM | POA: Diagnosis not present

## 2022-07-12 DIAGNOSIS — E119 Type 2 diabetes mellitus without complications: Secondary | ICD-10-CM | POA: Insufficient documentation

## 2022-07-12 DIAGNOSIS — R600 Localized edema: Secondary | ICD-10-CM | POA: Diagnosis not present

## 2022-07-12 DIAGNOSIS — E1169 Type 2 diabetes mellitus with other specified complication: Secondary | ICD-10-CM

## 2022-07-12 DIAGNOSIS — Z6838 Body mass index (BMI) 38.0-38.9, adult: Secondary | ICD-10-CM

## 2022-07-12 DIAGNOSIS — E669 Obesity, unspecified: Secondary | ICD-10-CM | POA: Diagnosis not present

## 2022-07-12 DIAGNOSIS — R609 Edema, unspecified: Secondary | ICD-10-CM | POA: Insufficient documentation

## 2022-07-13 ENCOUNTER — Encounter: Payer: Self-pay | Admitting: *Deleted

## 2022-07-13 NOTE — Progress Notes (Signed)
[facility] PREP Weekly Session  Patient Details  Name: Devon Mathews. MRN: [id number] Date of Birth: [birth year] Age: 71 y.o. PCP: Haywood Pao, MD  Vitals:   07/13/22 1558  Weight: 268 lb 3.2 oz (121.7 kg)     [facility] Weekly seesion - 07/13/22 1500       [facility] "PREP" Location   [facility]PREP[facility] Location [facility]      Weekly Session   Topic Discussed Stress management and problem solving   Importance of sleep, introduction to breathing exercise/meditation.   Minutes exercised this week 187 minutes    Classes attended to date [place], [place] 07/13/2022, 4:03 PM

## 2022-07-14 ENCOUNTER — Encounter (INDEPENDENT_AMBULATORY_CARE_PROVIDER_SITE_OTHER): Payer: Self-pay | Admitting: Family Medicine

## 2022-07-14 NOTE — Progress Notes (Unsigned)
Chief Complaint:   OBESITY Deiondre is here to discuss his progress with his obesity treatment plan along with follow-up of his obesity related diagnoses. Kewon is on the Category 3 Plan and states he is following his eating plan approximately 60% of the time. Alessandro states he is [facility] 12 week program 90 minutes 2 times per week.  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  Plans to work on improving compliance with meal plan.  Wife had been supportive.  Drinks un-sweet tea.  Does not eat many veggies.  Denies much hunger or cravings.  Able to portion control even without Ozempic.  He has been eating out more often.   Subjective:   1. Type 2 diabetes mellitus with other specified complication, unspecified whether long term insulin use ([place]) He is on Ozempic 0.5 mg weekly, but stopped two weeks ago.  He never saw weight loss or weight regain.  He has chosen to discontinue Ozempic.   2. Lower extremity edema He has lost 20 lbs of fluid on Bioimpedance, he has been off lasix and exercise is helping.   3. Coronary artery disease, unspecified vessel or lesion type, unspecified whether angina present, unspecified whether native or transplanted heart He is on Plavix, ARB, statin.     Assessment/Plan:   1. Type 2 diabetes mellitus with other specified complication, unspecified whether long term insulin use (HCC) A1c remains in prediabetic range.  Keep off Ozempic.  Recheck A1c in 3 months.   2. Lower extremity edema Continue low sodium diet and regular exercise.  3. Coronary artery disease, unspecified vessel or lesion type, unspecified whether angina present, unspecified whether native or transplanted heart Avoid use of stimulant and AOB's.   4. Obesity,current BMI 38.1 Artemis is  currently in the action stage of change. As such, his goal is to continue with weight loss efforts. He has agreed to the Category 3 Plan and keeping a food journal and adhering to recommended goals of 1600 calories and 110-120 protein daily.   Exercise goals:  PREP program.   Behavioral modification strategies: increasing lean protein intake, increasing vegetables, decreasing eating out, no skipping meals, meal planning and cooking strategies, keeping healthy foods in the home, and decreasing junk food.  Cledis has agreed to follow-up with our clinic in 4 weeks. He was informed of the importance of frequent follow-up visits to maximize his success with intensive lifestyle modifications for his multiple health conditions.   Objective:   Blood pressure 113/65, pulse (!) 58, temperature 98.5 F (36.9 C), height '5\' 10"'$  (1.778 m), weight 265 lb (120.2 kg), SpO2 93 %. Body mass index is 38.02 kg/m.  General: Cooperative, alert, well developed, in no acute distress. HEENT: Conjunctivae and lids unremarkable. Cardiovascular: Regular rhythm.  Lungs: Normal work of breathing. Neurologic: No focal deficits.   Lab Results  Component Value Date   CREATININE 1.11 01/07/2022   BUN 19 05/07/2022   NA 139 05/07/2022   K 4.9 05/07/2022   CL 109 (A) 05/07/2022   CO2 28 (A) 05/07/2022   Lab Results  Component Value Date   ALT 16 01/07/2022   AST 15 01/07/2022   ALKPHOS 72 01/07/2022   BILITOT 0.3 01/07/2022   Lab Results  Component Value Date   HGBA1C 5.6  09/21/2021   HGBA1C 6.2 04/17/2021   HGBA1C 6.9 (H) 03/09/2021   HGBA1C 5.5 12/21/2018   HGBA1C 5.2 09/14/2018   Lab Results  Component Value Date   INSULIN 32.1 (H) 09/21/2021   INSULIN 55.5 (H) 04/28/2021   Lab Results  Component Value Date   TSH 5.530 (H) 09/21/2021   Lab Results  Component Value Date   CHOL 101 05/07/2022   HDL 47 05/07/2022   LDLCALC 40 05/07/2022   TRIG 68 05/07/2022   CHOLHDL 2.5 01/07/2022   Lab  Results  Component Value Date   VD25OH 77.0 09/21/2021   VD25OH 73.3 04/28/2021   Lab Results  Component Value Date   WBC 4.6 05/07/2022   HGB 12.6 (A) 05/07/2022   HCT 37 (A) 05/07/2022   MCV 98 (H) 06/25/2020   PLT 163 05/07/2022   No results found for: "IRON", "TIBC", "FERRITIN"  Obesity Behavioral Intervention:   Approximately 15 minutes were spent on the discussion below.  ASK: We discussed the diagnosis of obesity with Carloyn Manner today and [place] agreed to give [place] permission to discuss obesity behavioral modification therapy today.  ASSESS: Delfino has the diagnosis of obesity and his BMI today is 38.1. Jah is in the action stage of change.   ADVISE: Ryann was educated on the multiple health risks of obesity as well as the benefit of weight loss to improve his health. He was advised of the need for long term treatment and the importance of lifestyle modifications to improve his current health and to decrease his risk of future health problems.  AGREE: Multiple dietary modification options and treatment options were discussed and Greogory agreed to follow the recommendations documented in the above note.  ARRANGE: Kallin was educated on the importance of frequent visits to treat obesity as outlined per CMS and USPSTF guidelines and agreed to schedule his next follow up appointment today.  Attestation Statements:   Reviewed by clinician on day of visit: allergies, medications, problem list, medical history, surgical history, family history, social history, and previous encounter notes.  I, Davy Pique, am acting as Location manager for Loyal Gambler, DO.  I have reviewed the above documentation for accuracy and completeness, and I agree with the above. Dell Ponto, DO

## 2022-07-20 ENCOUNTER — Encounter: Payer: Self-pay | Admitting: *Deleted

## 2022-07-20 NOTE — Progress Notes (Signed)
[facility] PREP Weekly Session  Patient Details  Name: Devon Mathews. MRN: [id number] Date of Birth: 03/03/1951 Age: 71 y.o. PCP: Haywood Pao, MD  Vitals:   07/20/22 1621  Weight: 270 lb 6.4 oz (122.7 kg)     [facility] Weekly seesion - 07/20/22 1600       [facility] "PREP" Location   [facility] "PREP" Location [facility]      Weekly Session   Topic Discussed Expectations and non-scale victories   Half way through program Restate/review goals, discuss barriers,. identify non-scale victories,commonalities of blue zones   Minutes exercised this week 70 minutes    Classes attended to date [place], [place] 07/20/2022, 4:23 PM

## 2022-07-27 ENCOUNTER — Encounter: Payer: Self-pay | Admitting: *Deleted

## 2022-07-27 NOTE — Progress Notes (Signed)
[facility] PREP Weekly Session  Patient Details  Name: Eh Sesay. MRN: [id number] Date of Birth: 09-Jun-1951 Age: 71 y.o. PCP: Haywood Pao, MD  Vitals:   07/27/22 1619  Weight: 276 lb 3.2 oz (125.3 kg)     [facility] Weekly seesion - 07/27/22 1600       [facility] "PREP" Location   [facility] "PREP" Location [facility]      Weekly Session   Topic Discussed Other   Portion control, deceptive food labeling, portion size demo.   Minutes exercised this week 100 minutes    Classes attended to date [place], [place] 07/27/2022, 4:21 PM

## 2022-08-01 ENCOUNTER — Other Ambulatory Visit (HOSPITAL_BASED_OUTPATIENT_CLINIC_OR_DEPARTMENT_OTHER): Payer: Self-pay | Admitting: Cardiovascular Disease

## 2022-08-02 NOTE — Telephone Encounter (Signed)
Rx request sent to pharmacy.  

## 2022-08-03 ENCOUNTER — Encounter: Payer: Self-pay | Admitting: *Deleted

## 2022-08-03 NOTE — Progress Notes (Signed)
[facility] PREP Weekly Session  Patient Details  Name: Devon Mathews. MRN: [id number] Date of Birth: [birth year] Age: 71 y.o. PCP: Haywood Pao, MD  Vitals:   08/03/22 1831  Weight: 274 lb 12.8 oz (124.6 kg)     [facility] Weekly seesion - 08/03/22 1600       [facility] "PREP" Location   [facility] "PREP" Location [facility]      Weekly Session   Topic Discussed Finding support   Accountability partners, positive support systems, self accountability tips.   Minutes exercised this week 80 minutes    Classes attended to date [place], [place] 08/03/2022, 6:36 PM

## 2022-08-04 ENCOUNTER — Other Ambulatory Visit (HOSPITAL_BASED_OUTPATIENT_CLINIC_OR_DEPARTMENT_OTHER): Payer: Self-pay | Admitting: Cardiovascular Disease

## 2022-08-04 NOTE — Telephone Encounter (Signed)
*  STAT* If patient is at the pharmacy, call can be transferred to refill team.   1. Which medications need to be refilled? (please list name of each medication and dose if known) new prescription for Nitroglycerin  2. Which pharmacy/location (including street and city if local pharmacy) is medication to be sent to?[facility] [place], [place]  3. Do they need a 30 day or 90 day supply?

## 2022-08-04 NOTE — Telephone Encounter (Signed)
Rx request sent to pharmacy.  

## 2022-08-05 ENCOUNTER — Emergency Department (HOSPITAL_COMMUNITY): Payer: Medicare HMO

## 2022-08-05 ENCOUNTER — Emergency Department (HOSPITAL_COMMUNITY)
Admission: EM | Admit: 2022-08-05 | Discharge: 2022-08-05 | Disposition: A | Discharge status: Home / Self Care | Payer: Medicare HMO | Attending: Emergency Medicine | Admitting: Emergency Medicine

## 2022-08-05 ENCOUNTER — Other Ambulatory Visit: Payer: Self-pay

## 2022-08-05 ENCOUNTER — Encounter: Payer: Self-pay | Admitting: *Deleted

## 2022-08-05 ENCOUNTER — Encounter (HOSPITAL_COMMUNITY): Payer: Self-pay

## 2022-08-05 ENCOUNTER — Telehealth: Payer: Self-pay | Admitting: Cardiovascular Disease

## 2022-08-05 DIAGNOSIS — I2510 Atherosclerotic heart disease of native coronary artery without angina pectoris: Secondary | ICD-10-CM | POA: Insufficient documentation

## 2022-08-05 DIAGNOSIS — R0789 Other chest pain: Secondary | ICD-10-CM | POA: Insufficient documentation

## 2022-08-05 DIAGNOSIS — R079 Chest pain, unspecified: Secondary | ICD-10-CM | POA: Diagnosis not present

## 2022-08-05 DIAGNOSIS — R0602 Shortness of breath: Secondary | ICD-10-CM | POA: Diagnosis not present

## 2022-08-05 LAB — BASIC METABOLIC PANEL
Anion gap: 7 (ref 5–15)
BUN: 18 mg/dL (ref 8–23)
CO2: 22 mmol/L (ref 22–32)
Calcium: 8.6 mg/dL — ABNORMAL LOW (ref 8.9–10.3)
Chloride: 109 mmol/L (ref 98–111)
Creatinine, Ser: 1.21 mg/dL (ref 0.61–1.24)
GFR, Estimated: >60 mL/min (ref >60)
Glucose, Bld: 118 mg/dL — ABNORMAL HIGH (ref 70–99)
Potassium: 4.3 mmol/L (ref 3.5–5.1)
Sodium: 138 mmol/L (ref 135–145)

## 2022-08-05 LAB — CBC
HCT: 37.1 % — ABNORMAL LOW (ref 39.0–52.0)
Hemoglobin: 12.1 g/dL — ABNORMAL LOW (ref 13.0–17.0)
MCH: 31.1 pg (ref 26.0–34.0)
MCHC: 32.6 g/dL (ref 30.0–36.0)
MCV: 95.4 fL (ref 80.0–100.0)
Platelets: 186 10*3/uL (ref 150–400)
RBC: 3.89 MIL/uL — ABNORMAL LOW (ref 4.22–5.81)
RDW: 16.3 % — ABNORMAL HIGH (ref 11.5–15.5)
WBC: 4.1 10*3/uL (ref 4.0–10.5)
nRBC: 0 % (ref 0.0–0.2)

## 2022-08-05 LAB — TROPONIN I (HIGH SENSITIVITY)
Troponin I (High Sensitivity): 4 ng/L (ref <18)
Troponin I (High Sensitivity): 5 ng/L (ref <18)

## 2022-08-05 MED ORDER — IOHEXOL 350 MG/ML SOLN
100.0000 mL | Freq: Once | INTRAVENOUS | Status: AC | PRN
  Administered 2022-08-05: 100 mL via INTRAVENOUS

## 2022-08-05 MED ORDER — ASPIRIN 81 MG PO CHEW
324.0000 mg | CHEWABLE_TABLET | Freq: Once | ORAL | Status: AC
  Administered 2022-08-05: 324 mg via ORAL
  Filled 2022-08-05: qty 4

## 2022-08-05 NOTE — ED Provider Notes (Signed)
[facility] Provider Note   CSN: [id number] Arrival date & time: 08/05/22  1424     History Chief Complaint  Patient presents with  . Chest Pain    HPI Devon Mathews. is a 71 y.o. male presenting for left-sided chest pain radiating into his epigastrium into his back.  He has a history of CAD status post PCI in 2019 and recurrent evaluation for CAD in 2020. This is complicated by hiatal hernia, umbilical hernia and chronic epigastric discomfort.  He does note that this feels substantially different than his underlying heartburn and esophagitis.  It is radiating to his left shoulder intermittently.  It is currently not present, last present this morning.  It woke him up from sleep at 7 AM.  He has been chest pain-free since his last catheterization.  He denies fevers or chills, nausea vomiting, syncope or shortness of breath. He went to cardiac rehab this morning when he was noted to be having pain on exertion and to the supervising physician recommended he come to the emergency department.   Patient's recorded medical, surgical, social, medication list and allergies were reviewed in the Snapshot window as part of the initial history.   Review of Systems   Review of Systems  Constitutional:  Negative for chills and fever.  HENT:  Negative for ear pain and sore throat.   Eyes:  Negative for pain and visual disturbance.  Respiratory:  Positive for shortness of breath. Negative for cough.   Cardiovascular:  Positive for chest pain. Negative for palpitations.  Gastrointestinal:  Negative for abdominal pain and vomiting.  Genitourinary:  Negative for dysuria and hematuria.  Musculoskeletal:  Negative for arthralgias and back pain.  Skin:  Negative for color change and rash.  Neurological:  Negative for seizures and syncope.  All other systems reviewed and are negative.   Physical Exam Updated Vital Signs BP (!) 115/54   Pulse (!) 59   Temp 98 F (36.7 C)  (Oral)   Resp 16   Ht '5\' 10"'$  (1.778 m)   Wt 122.5 kg   SpO2 93%   BMI 38.74 kg/m  Physical Exam Vitals and nursing note reviewed.  Constitutional:      General: He is not in acute distress.    Appearance: He is well-developed.  HENT:     Head: Normocephalic and atraumatic.  Eyes:     Conjunctiva/sclera: Conjunctivae normal.  Cardiovascular:     Rate and Rhythm: Normal rate and regular rhythm.     Heart sounds: No murmur heard. Pulmonary:     Effort: Pulmonary effort is normal. No respiratory distress.     Breath sounds: Normal breath sounds.  Abdominal:     Palpations: Abdomen is soft.     Tenderness: There is no abdominal tenderness.  Musculoskeletal:        General: No swelling.     Cervical back: Neck supple.  Skin:    General: Skin is warm and dry.     Capillary Refill: Capillary refill takes less than 2 seconds.  Neurological:     Mental Status: He is alert.  Psychiatric:        Mood and Affect: Mood normal.     ED Course/ Medical Decision Making/ A&P    Procedures Procedures   Medications Ordered in ED Medications  aspirin chewable tablet 324 mg (324 mg Oral Given 08/05/22 1534)  iohexol (OMNIPAQUE) 350 MG/ML injection 100 mL (100 mLs Intravenous Contrast Given 08/05/22 1723)  Medical Decision Making: [name]. is a 71 y.o. male who presented to the ED today with chest pain, detailed above.  Based on patient's comorbidities, patient with risk for ACS 2/2 history of similar .    Additional history discussed with patient's family/caregivers.  Patient's presentation is complicated by their history of multiple extensive comorbid medical conditions, obesity, hypertension, diabetes, history of CAD.  Patient placed on continuous vitals and telemetry monitoring while in ED which was reviewed periodically.  Complete initial physical exam performed, notably the patient was hemodynamically stable in no acute distress.  Currently asymptomatic.   Reviewed and  confirmed nursing documentation for past medical history, family history, social history.    Initial Assessment: With the patient's presentation of left-sided chest pain, most likely diagnosis is musculoskeletal chest pain versus GERD, although ACS remains on the differential. Other diagnoses were considered including (but not limited to) pulmonary embolism, community-acquired pneumonia, aortic dissection, pneumothorax, underlying bony abnormality, anemia. These are considered less likely due to history of present illness and physical exam findings.    Aortic Dissection also reconsidered but seems less likely based on the location, quality, onset, and severity of symptoms in this case. Patient also has a lack of underlying history of AD or TAA.  This is most consistent with an acute life/limb threatening illness complicated by underlying chronic conditions.   Initial Plan: Evaluate for ACS with delta troponin and EKG evaluated as below  Evaluate for dissection, bony abnormality, or pneumonia with chest x-ray and screening laboratory evaluation including CBC, BMP  Further evaluation for pulmonary embolism and aortic disease is warranted based on patient's PERC and Wells scores Initial Study Results: EKG was reviewed independently. Rate, rhythm, axis, intervals all examined and without medically relevant abnormality. ST segments without concerns for elevations.    Laboratory  Delta troponin demonstrated no acute abnormality  CBC and BMP without obvious metabolic or inflammatory abnormalities requiring further evaluation   Radiology  CT Angio Chest PE W and/or Wo Contrast  Result Date: 08/05/2022 CLINICAL DATA:  Chest tightness, shortness of breath EXAM: CT ANGIOGRAPHY CHEST WITH CONTRAST TECHNIQUE: Multidetector CT imaging of the chest was performed using the standard protocol during bolus administration of intravenous contrast. Multiplanar CT image reconstructions and MIPs were obtained to  evaluate the vascular anatomy. RADIATION DOSE REDUCTION: This exam was performed according to the departmental dose-optimization program which includes automated exposure control, adjustment of the mA and/or kV according to patient size and/or use of iterative reconstruction technique. CONTRAST:  163m OMNIPAQUE IOHEXOL 350 MG/ML SOLN COMPARISON:  07/10/2020 FINDINGS: Cardiovascular: No filling defect is identified in the pulmonary arterial tree to suggest pulmonary embolus. Coronary, aortic arch, and branch vessel atherosclerotic vascular disease. Mild cardiomegaly. Stable mild prominence of the central pulmonary arteries which may reflect pulmonary arterial hypertension. Mediastinum/Nodes: Unremarkable Lungs/Pleura: Unremarkable Upper Abdomen: Nodular hepatic contour suspicious for cirrhosis. 1.2 cm hypodense lesion inferiorly in the lateral segment left hepatic lobe on image 106 series 5, probably a cyst given the fluid density of 8 Hounsfield units. This measured about 0.7 cm in diameter on 10/10/2015 and accordingly is felt to be benign. Musculoskeletal: Substantial thoracic spondylosis with multilevel bridging anterior spurring. Small upper abdominal ventral hernia containing adipose tissue for example on image 115 series 9. Review of the MIP images confirms the above findings. IMPRESSION: 1. No filling defect is identified in the pulmonary arterial tree to suggest pulmonary embolus. 2. Coronary, aortic arch, and branch vessel atherosclerotic vascular disease. Mild cardiomegaly. 3. Stable  mild prominence of the central pulmonary arteries which may reflect pulmonary arterial hypertension. 4. Nodular hepatic contour suspicious for cirrhosis. 5. Small upper abdominal ventral hernia containing adipose tissue. 6. Substantial thoracic spondylosis with multilevel bridging anterior spurring. 7. Aortic atherosclerosis. Aortic Atherosclerosis (ICD10-I70.0). Electronically Signed   By: Van Clines M.D.   On:  08/05/2022 17:43   DG Chest 2 View  Result Date: 08/05/2022 CLINICAL DATA:  Chest tightness and indigestion EXAM: CHEST - 2 VIEW COMPARISON:  01/10/19 CXR, 07/10/20 CT Chest FINDINGS: No pleural effusion. No pneumothorax. No focal airspace opacity. The visualized upper abdomen is unremarkable. No displaced rib fractures. There are degenerative changes of the bilateral AC joints Peri vertebral body heights are maintained. Compared to prior radiograph there is slight interval increase in bilateral interstitial opacities IMPRESSION: 1.  No focal airspace opacity. 2. Slight interval increase in bilateral interstitial opacities compared to prior chest radiograph. Given findings on prior CTA, this could represent findings related to pulmonary arterial hypertension. Electronically Signed   By: Marin Roberts M.D.   On: 08/05/2022 15:27    Final Assessment and Plan: Prolonged shared medical decision making conversation with patient's family.  Discussed findings of normal troponins and reassuring CT scan today.  Ultimately, recommended hospital observation and cardiology consultation in the inpatient setting, however patient feels symptomatically resolved and has an establish care relationship with a cardiologist in the outpatient setting and would prefer to follow-up in the outpatient setting with discharge tonight.  Overall this is reasonable given stable findings, reassuring labs, reassuring EKG.  Discussed risk of ACS within the next 30 days based on patient's risk factors and patient expressed understanding of elevated risk for coronary syndrome.  He would still like to follow-up in the outpatient setting and plans to call his cardiologist in the morning or return if his symptoms recur.  Patient ambulatory tolerating p.o. intake at time of discharge stable for outpatient care and management.    Clinical Impression:  1. Chest pain, unspecified type      Discharge   Final Clinical Impression(s) / ED  Diagnoses Final diagnoses:  Chest pain, unspecified type    Rx / DC Orders ED Discharge Orders     None         Tretha Sciara, MD 08/06/22 0010

## 2022-08-05 NOTE — Telephone Encounter (Signed)
Calling to make provider aware that she is sending pt to the ED. Please advise

## 2022-08-05 NOTE — ED Triage Notes (Signed)
Pt presents to ED with complaints of chest tightness and indigestion x couple of days. Hx heart cath. Pt reports Sob with exertion

## 2022-08-05 NOTE — Progress Notes (Signed)
Patient came to PREP class today at 1400 reporting intermittent chest discomfort and SOB beginning yesterday. He stated that it felt like indigestion, but not dissimilar to the discomfort he had with a prior cardiac event. He also reports one episode of left shoulder pain. He is no distress at this time. Dr. Blenda Mounts office notified and I sent patient to [facility] ER which is next to the [facility] where class was meeting. Patient was accompanied by his wife.

## 2022-08-05 NOTE — Telephone Encounter (Signed)
FYI

## 2022-08-06 ENCOUNTER — Other Ambulatory Visit (HOSPITAL_BASED_OUTPATIENT_CLINIC_OR_DEPARTMENT_OTHER): Payer: Self-pay

## 2022-08-06 ENCOUNTER — Telehealth: Payer: Self-pay | Admitting: Cardiovascular Disease

## 2022-08-06 MED ORDER — NITROGLYCERIN 0.4 MG SL SUBL: 0.4000 mg | SUBLINGUAL_TABLET | SUBLINGUAL | 2 refills | Status: DC | PRN

## 2022-08-06 NOTE — Telephone Encounter (Signed)
Spoke with patient and offered him appointment 10/24 at 9:20 am  He has weight loss appointment but will call them Monday to reschedule, will schedule visit after he cancels

## 2022-08-06 NOTE — Telephone Encounter (Signed)
Returned call from patient who states he went to [facility] ED for complaints of indigestion, left shoulder pain, and chest pain.  All symptoms did not feel like his "typical symptoms."  Patient is calling to request Dr. Oval [name] reviews the notes from his ED visit to see if she thinks he should have any additional testing. Georgana Curio MHA RN CCM

## 2022-08-06 NOTE — Telephone Encounter (Signed)
See updated phone encounter.   Loel Dubonnet, NP

## 2022-08-06 NOTE — Telephone Encounter (Signed)
Recommend schedule for OV with TR or CW in the next 2 weeks to re-evaluate any symptoms. Ensure PRN nitroglycerin is in date.   Loel Dubonnet, NP

## 2022-08-06 NOTE — Telephone Encounter (Signed)
Pt is requesting call back to discuss his ED visit 10/19. He states that hosp doctor suggest he reach out to his cardiologist to see if any further tests should be done and to go over further results. Please advise.

## 2022-08-06 NOTE — Telephone Encounter (Signed)
Pt denied any ongoing symptoms today,  States symptoms from 10/19 were intermittent.  Discussed with patient if he should experience ongoing symptoms, accompanied by diaphoresis, nausea or more constant he should call 911. Pt verbalized understanding of directions as given. Georgana Curio MHA RN CCM

## 2022-08-09 ENCOUNTER — Telehealth: Payer: Self-pay | Admitting: Cardiovascular Disease

## 2022-08-09 ENCOUNTER — Ambulatory Visit (INDEPENDENT_AMBULATORY_CARE_PROVIDER_SITE_OTHER): Payer: Medicare HMO | Admitting: Family Medicine

## 2022-08-09 NOTE — Telephone Encounter (Signed)
Advised patient

## 2022-08-09 NOTE — Telephone Encounter (Signed)
Patient scheduled by Alvina Filbert, LPN

## 2022-08-09 NOTE — Telephone Encounter (Signed)
Follow  Up:     Patient want Devon Mathews to know that she can go ahead and schedule his appointment with Dr Oval [name] for Tuesday(08-10-22). He cancelled his other appointment.

## 2022-08-10 ENCOUNTER — Ambulatory Visit (INDEPENDENT_AMBULATORY_CARE_PROVIDER_SITE_OTHER): Payer: Medicare HMO | Admitting: Family Medicine

## 2022-08-10 ENCOUNTER — Ambulatory Visit (HOSPITAL_BASED_OUTPATIENT_CLINIC_OR_DEPARTMENT_OTHER): Payer: Medicare HMO | Admitting: Cardiovascular Disease

## 2022-08-10 ENCOUNTER — Encounter (HOSPITAL_BASED_OUTPATIENT_CLINIC_OR_DEPARTMENT_OTHER): Payer: Self-pay | Admitting: Cardiovascular Disease

## 2022-08-10 VITALS — BP 110/56 | HR 56 | Ht 70.0 in | Wt 276.9 lb

## 2022-08-10 DIAGNOSIS — I209 Angina pectoris, unspecified: Secondary | ICD-10-CM | POA: Diagnosis not present

## 2022-08-10 DIAGNOSIS — E669 Obesity, unspecified: Secondary | ICD-10-CM

## 2022-08-10 DIAGNOSIS — I5032 Chronic diastolic (congestive) heart failure: Secondary | ICD-10-CM

## 2022-08-10 DIAGNOSIS — E785 Hyperlipidemia, unspecified: Secondary | ICD-10-CM | POA: Diagnosis not present

## 2022-08-10 DIAGNOSIS — Z01812 Encounter for preprocedural laboratory examination: Secondary | ICD-10-CM

## 2022-08-10 DIAGNOSIS — G4733 Obstructive sleep apnea (adult) (pediatric): Secondary | ICD-10-CM

## 2022-08-10 DIAGNOSIS — I10 Essential (primary) hypertension: Secondary | ICD-10-CM

## 2022-08-10 MED ORDER — SODIUM CHLORIDE 0.9% FLUSH: 3.0000 mL | Freq: Two times a day (BID) | INTRAVENOUS | Status: AC

## 2022-08-10 NOTE — Assessment & Plan Note (Signed)
Blood pressure stable on amlodipine, losartan, and metoprolol.  Continue torsemide.

## 2022-08-10 NOTE — Progress Notes (Signed)
Cardiology Office Note   Date:  08/10/2022   ID:  [name], DOB 1951-08-09, MRN [id number]  PCP:  Haywood Pao, MD  Cardiologist:  None  Electrophysiologist:  None  Sleep: Dr. Claiborne Billings  Evaluation Performed:  Follow-Up Visit  Chief Complaint:  Follow up  History of Present Illness:    Devon Mathews is a 71 y.o. male with CAD s/p NSTEMI and PCI, paroxysmal atrial fibrillation, procedural pericardial effusion/tamponade s/p pericardial window, mild AR, hypertension, hyperlipidemia, OSA and obestiy who presents for follow up.  Mr. Servellon was previously seen by Dr. Rollene Fare and by Dr. Einar Gip.  He last saw Dr. Einar Gip 01/2015 and reported mild, chronic dyspnea. He underwent cardiac catheterization in 2003 and 2006 with a 40% LAD lesion noted.  He had an ETT in 2007 that was negative for ischemia and a nuclear stress in 2011 and 2012 that were negative.  He had an echo 07/02/13 that revealed LVEF 55-60% and mild mitral regurgitation. He was seen in clinic 07/2016 and reported several months of chest pain and exertional shortness of breath.  He was referred for exercise Myoview 08/13/16 that revealed LVEF 63% and no ischemia.  He also had an echo 08/16/16 with LVEF 60-65%, mild LVH, mild AR and mild TR.  Mr. Owensby had a sleep study in 2014 that demonstrated moderate OSA. He continues to be compliant with CPAP.     Mr. Merkel had an NSTEMI 08/2018.  Troponin peaked at 0.15.  He underwent LHC and had a 95% mid LAD lesion that was successfully stented with a DES.  20% distal left main disease was also noted.  LVEF was 65% on left ventriculogram.  Simvastatin was switched to atorvastatin and metoprolol was increased to 50 mg daily.  Plans were made to continue dual antiplatelet therapy for 1 to 3 months followed by ticagrelor alone.  Since his last appointment Mr. Lazarus [place], [place] and reported angina.  He was referred for [facility] 12/01/2018 where there was concern for reversible ischemia in  the anterolateral region and infarct with peri-infarct ischemia in the inferolateral and inferoseptal regions.  He subsequently underwent left heart catheterization 12/05/2018 that revealed a 90% ostial D1 lesion.  He underwent successful balloon angioplasty.  However the procedure was complicated by a diagonal wire perforation with subsequent development of a pericardial effusion and tamponade.  He developed hemorrhagic pericarditis and was started on colchicine.  He was discharged but developed A worsening effusion and early tamponade was noted on his echo 12/2018.  He was admitted and underwent pericardial window.  During that time he also developed atrial fibrillation and had a TIA. His torsemide was increased because of volume overload. He followed up with catherine lawrence, DNP 02/2021 and his weight was still increasing. Torsemide was again increased. Repeat Echo 03/2021 revealed LVEF 60-65% with grade 1 diastolic dysfunction. BNP had been normal.  Mr. Fennell was taking torsemide once every other day. He had returned to the lower dose after developing some kidney pain when taking extra torsemide. 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 He reported typical home blood pressures in the 110s, with rare 90s or 120s. He had one episode of severe fatigue and found his BP to be in the 28N systolic.  At his last visit he was doing well and working on weight loss. Ozempic was increased and he was referred to prep at the [facility]. He was seen in the  ED 08/05/2022 with sudden onset chest pain. The chest pain awoke him from sleep. He went to cardiac rehab and reported exertional chest pain so they sent him to the ED. Cardiac enzymes were negative. They recommended inpatient management but he felt better and wanted to leave. Chest CT was negative for PE. Today, patient states that he feels that his chest pain may be associated with stress as he has been caring for his wife who is  recovering from RTC surgery and his busy schedule for the few weeks prior. He states that the chest pain initially felt like indigestion in his epigastric region and was accompanied by severe DOE. Patient notes that he never experiences indigestion anymore as he has been compliant with his Protonix regimen. He reports that the chest tightness was intermittent for a few days before he went in for cardiac rehab. Due to his symptoms he was advised to not attempt that day's exercises and was sent to the ED. He continues to feel winded with minimal activity. He states that his shortness of breath has been present for a while and he feels that it is due to his deconditioning and ~15lb weight gain. He resumed Ozempic last week after being off of it for 1 week. He reports that he occasionally drinks alcohol. He last had 2 beers a few days ago but had not had alcohol in quite some time prior to that. He was unaware of the cirrhosis findings on his recent CTA. He does not have any established GI care.   Past Medical History:  Diagnosis Date   Anxiety    Atrial fibrillation (HCC)    CAD S/P percutaneous coronary angioplasty 09/13/2018    99% p-mLAD (BTW d1&d2) -> SYNERGY DES 3.5 X 12 (3.75 mm). 20% LM.  Cx, small RI & co-dom RCA normal.  EF 65%.    Chronic diastolic heart failure ([place]) 11/30/2019   Congestive heart failure (CHF) ([place])    Diabetes mellitus without complication ([place])    Dizziness 2012   normal findings..   Essential hypertension 07/28/2016   GERD (gastroesophageal reflux disease)    Hyperlipidemia    Lower extremity edema 07/28/2016   NSTEMI (non-ST elevated myocardial infarction) ([place]) 09/13/2018   The patient presented 09/13/2018 with a non-ST elevation MI, troponin peak was 0.15   Obesity    Obesity (BMI 30-39.9) 07/28/2016   OSA (obstructive sleep apnea) 07/28/2016   Restless leg syndrome    mild   SCCA (squamous cell carcinoma) of skin 08/24/2021   Left Forehead (in  situ-inflitrating)   Shortness of breath 07/28/2016   Sleep apnea    Squamous cell carcinoma of skin 03/20/2015   right ant scalp(curetx3, 62f)   Squamous cell carcinoma of skin 01/01/2016   left lateral forehead (curetx3, 569f   Squamous cell carcinoma of skin 06/15/2019   left mid anterior scalp (tx afte bx)   Squamous cell carcinoma of skin 01/08/2021   well diff- left forehead   Squamous cell carcinoma of skin 01/08/2021   in situ- mid frontal scalp   TIA (transient ischemic attack)    Tubular adenoma    Past Surgical History:  Procedure Laterality Date   COLONOSCOPY     CORONARY BALLOON ANGIOPLASTY N/A 12/05/2018   Procedure: CORONARY BALLOON ANGIOPLASTY;  Surgeon: HaLeonie ManMD;  [facility];  Service: Cardiovascular;  Laterality: N/A;   CORONARY STENT INTERVENTION N/A 09/13/2018   Procedure: CORONARY STENT INTERVENTION;  Surgeon: SmBelva CromeMD;  Location: [facility];  Service: Cardiovascular;; p-m LAD (btw D1&D2) - SYNERGY DES 3.5 x 12 (3.75 mm).   ESOPHAGOGASTRODUODENOSCOPY (EGD) WITH PROPOFOL N/A 03/01/2022   Procedure: ESOPHAGOGASTRODUODENOSCOPY (EGD) WITH PROPOFOL;  Surgeon: Gatha Mayer, MD;  Location: [facility];  Service: Gastroenterology;  Laterality: N/A;  Polypectomy   GASTRIC VARICES BANDING  03/01/2022   Procedure: GASTRIC  BANDING;  Surgeon: Gatha Mayer, MD;  Location: [facility];  Service: Gastroenterology;;   KNEE ARTHROSCOPY Right    LEFT HEART CATH AND CORONARY ANGIOGRAPHY N/A 09/13/2018   Procedure: LEFT HEART CATH AND CORONARY ANGIOGRAPHY;  Surgeon: Belva Crome, MD;  Location: [facility];  Service: Cardiovascular;;  99% p-mLAD (BTW d1&d2) -> SYNERGY DES 3.5 X 12 (3.75 mm). 20% LM.  Cx, small RI & co-dom RCA normal.  EF 65%.    LEFT HEART CATH AND CORONARY ANGIOGRAPHY  2003   30-40% proximal segmental stenosis in the LAD   LEFT HEART CATH AND CORONARY ANGIOGRAPHY  2006   EF >50% & no mitral  regurgitation   LEFT HEART CATH AND CORONARY ANGIOGRAPHY N/A 12/05/2018   Procedure: LEFT HEART CATH AND CORONARY ANGIOGRAPHY;  Surgeon: [name], MD;  Location: [place] [facility];  Service: Cardiovascular;  Laterality: N/A;   NM MYOVIEW LTD  07/2016   Normal.  EF 55-65% 63%).  No ischemia or infarction.  LOW RISK   NM MYOVIEW LTD  11/2018   6: 34 min.  7.2 METS.  Noted chest pain and tightness.  Horizontal ST of T wave depression (2 mm) II, 3, V5 and V6 (HIGH RISK), large/severe defect basal-apical inferior, inferolateral wall.  Large-severe basal-apical anterior-anterolateral wall.  Medium/moderate defect in basal and mid inferoseptal wall.  Consistent with large prior infarct with peri-infarct ischemia.  HIGH RISK   PERICARDIOCENTESIS N/A 12/05/2018   Procedure: PERICARDIOCENTESIS;  Surgeon: [name], MD;  Location: [place] [facility];  Service: Cardiovascular;  Laterality: N/A;   SUBXYPHOID PERICARDIAL WINDOW N/A 12/25/2018   Procedure: SUBXYPHOID PERICARDIAL WINDOW;  Surgeon: [name], MD;  Location: [facility];  Service: Thoracic;  Laterality: N/A;   TEE WITHOUT CARDIOVERSION N/A 12/25/2018   Procedure: TRANSESOPHAGEAL ECHOCARDIOGRAM (TEE);  Surgeon: [name], MD;  Location: [facility] OR;  Service: Thoracic;  Laterality: N/A;   tendon detachment Right    arm   TRANSTHORACIC ECHOCARDIOGRAM  07/2016   EF 60-65% with mild LVH.  Mild AI.  Trivial MR and TR.   VASECTOMY       Current Meds  Medication Sig   acetaminophen (TYLENOL) 500 MG tablet Take 500-1,000 mg by mouth every 6 (six) hours as needed for moderate pain.   amitriptyline (ELAVIL) 25 MG tablet Take 0.5 tablets (12.5 mg total) by mouth at bedtime as needed for sleep.   amLODipine (NORVASC) 2.5 MG tablet Take 1 tablet (2.5 mg total) by mouth daily.   atorvastatin (LIPITOR) 80 MG tablet TAKE 1 TABLET BY MOUTH EVERY DAY   Cholecalciferol (VITAMIN D) 125 MCG (5000 UT) CAPS Take 5,000 Units by mouth daily.    clopidogrel (PLAVIX) 75 MG tablet TAKE 1 TABLET BY MOUTH EVERY DAY   ELIQUIS 5 MG TABS tablet Take 1 tablet (5 mg total) by mouth 2 (two) times daily.   escitalopram (LEXAPRO) 10 MG tablet Take 10 mg by mouth daily.   losartan (COZAAR) 50 MG tablet Take 1 tablet (50 mg total) by mouth daily.   metoprolol succinate (TOPROL-XL) 50 MG 24  hr tablet TAKE 1 TABLET BY MOUTH EVERY DAY WITH OR IMMEDIATELY FOLLOWING A MEAL (Patient taking differently: Take 50 mg by mouth daily.)   nitroGLYCERIN (NITROSTAT) 0.4 MG SL tablet Place 1 tablet (0.4 mg total) under the tongue every 5 (five) minutes as needed for chest pain.   pantoprazole (PROTONIX) 40 MG tablet TAKE ONE TABLET BY MOUTH ONE TIME DAILY. must make follow up appointment for further refills (Patient taking differently: Take 40 mg by mouth daily.)   potassium chloride SA (KLOR-CON M) 20 MEQ tablet Take 20 mEq by mouth daily as needed (when taking furosemide).   Semaglutide,0.25 or 0.'5MG'$ /DOS, (OZEMPIC, 0.25 OR 0.5 MG/DOSE,) 2 MG/3ML SOPN Inject into the skin once a week.   torsemide (DEMADEX) 20 MG tablet Take 20 mg by mouth 2 (two) times daily as needed.   Current Facility-Administered Medications for the 08/10/22 encounter (Office Visit) with Skeet Latch, MD  Medication   sodium chloride flush (NS) 0.9 % injection 3 mL     Allergies:   Penicillins   Social History   Tobacco Use   Smoking status: Never   Smokeless tobacco: Never  Vaping Use   Vaping Use: Never used  Substance Use Topics   Alcohol use: No   Drug use: No     Family Hx: The patient's family history includes COPD in his father; Diabetes in his brother, mother, and sister; Heart attack in his paternal grandmother and sister; Heart disease in his father and mother; Heart failure in his father, mother, and sister; Hypertension in his mother; Other in his father and maternal grandfather; Pancreatic cancer in his paternal aunt and paternal uncle; Stroke in his father. There is  no history of Colon cancer, Esophageal cancer, Stomach cancer, or Rectal cancer.  ROS:   Please see the history of present illness.    (+) Dyspnea on exertion (+) Chest tightness All other systems reviewed and are negative.   Prior CV studies:   The following studies were reviewed today:  Lexiscan Myoview 12/01/18: Nuclear stress EF: 54%. The left ventricular ejection fraction is mildly decreased (45-54%). Horizontal ST segment depression ST segment depression of 2 mm was noted during stress in the II, III, V5 and V6 leads, beginning at 8 minutes of stressST deviation beginning in recovery. Defect 1: There is a large defect of severe severity present in the basal inferior, basal inferolateral, mid inferior, mid inferolateral, apical inferior, apical lateral and apex location. Defect 2: There is a large defect of severe severity present in the basal anterior, basal anterolateral, mid anterior, mid anterolateral, apical anterior and apex location. Defect 3: There is a medium defect of moderate severity present in the basal inferoseptal and mid inferoseptal location. Findings consistent with ischemia and prior myocardial infarction with peri-infarct ischemia. This is a high risk study. There is a medium size, severe perfusion defect in the inferior and inferolateral walls with stress that is partially reversible at rest, returning to moderate. Base to apex. There is a absent, large perfusion defect in the anterior wall from base to apex. It is partially reversible at rest and returns to moderate at the base and mid ventricle and returns to mild at the apex. Anterolateral defect is severe, and with full reversibility. Medium size, moderate perfusion defect in the inferoseptum with partial reversibility, returning to mild at rest.   LHC 12/05/18: SUMMARY Culprit lesion:  90% ostial 1st Diag (jailed) -successful scoring balloon angioplasty using 2.5 mm Wolverine scoring balloon --Distal diagonal  wire perforation  with pericardial effusion and initial stages of pericardial tamponade --> successful occlusion with prolonged balloon inflations and administration of protamine --Unsuccessful pericardiocentesis Widely patent LAD stent Otherwise stable coronary arteries. Normal LVEF and EDP pre-PCI Clear ST elevations for roughly 15 minutes, most likely type for MI.    Echo 12/06/18: IMPRESSIONS  1. The left ventricle has normal systolic function, with an ejection fraction of 55-60%. The cavity size was normal. Left ventricular diastolic parameters were normal.  2. Small pericardial effusion seen at the apex, lateral wall and anterior to RV.  3. The mitral valve is degenerative. Mild thickening of the mitral valve leaflet. Mild calcification of the mitral valve leaflet.  4. The aortic valve is tricuspid There is Moderate thickening of the aortic valve There is Mild calcification of the aortic valveAortic valve regurgitation is trivial by color flow Doppler.  Echo 12/21/18: IMPRESSIONS  1. The left ventricle has hyperdynamic systolic function, with an ejection fraction of >65%. Left ventricular diastology could not be evaluated due to nondiagnostic images.  2. The mitral valve is normal in structure.  3. Aortic valve regurgitation is mild by color flow Doppler.  4. Moderate pericardial effusion.  5. The pericardial effusion is circumferential.  6. There is inversion of the right ventricular wall, inversion of the right atrial wall and excessive respiratory variation in the mitral valve spectral Doppler velocities.  7. When compared to the prior study: Compared to study 11/2018, the pericardial effusion has incrased in size and now significant effusion present posterior as well as anteriorly. There is RA inversion and suggestive of subtle intermittent RV diastolic  collapse. There is some respiratory variation in the [facility] inlow pattern with respirations. The IVC is not well visualized. Concern for  early tamponade.   Carotid Doppler 12/21/18: Minimal plaque bilaterally.  CT Angio Chest Aorta 07/10/2020: 1. Negative for acute PE or thoracic aortic dissection. 2. Dilated central pulmonary arteries suggesting pulmonary arterial hypertension. 3. Coronary calcifications. The severity of coronary artery disease and any potential stenosis cannot be assessed on this non-gated CT examination.  Echo 03/27/2021:  1. Left ventricular ejection fraction, by estimation, is 60 to 65%. The  left ventricle has normal function. The left ventricle has no regional  wall motion abnormalities. Left ventricular diastolic parameters were  normal.   2. Right ventricular systolic function is normal. The right ventricular  size is moderately enlarged. There is normal pulmonary artery systolic  pressure. The estimated right ventricular systolic pressure is 00.9 mmHg.   3. The mitral valve is normal in structure. Trivial mitral valve  regurgitation. No evidence of mitral stenosis.   4. The aortic valve is tricuspid. Aortic valve regurgitation is trivial.  No aortic stenosis is present.   5. The inferior vena cava is normal in size with greater than 50%  respiratory variability, suggesting right atrial pressure of 3 mmHg.  Comparison(s): No significant change from prior study.   CTA Chest 08/05/2022: IMPRESSION: 1. No filling defect is identified in the pulmonary arterial tree to suggest pulmonary embolus. 2. Coronary, aortic arch, and branch vessel atherosclerotic vascular disease. Mild cardiomegaly. 3. Stable mild prominence of the central pulmonary arteries which may reflect pulmonary arterial hypertension. 4. Nodular hepatic contour suspicious for cirrhosis. 5. Small upper abdominal ventral hernia containing adipose tissue. 6. Substantial thoracic spondylosis with multilevel bridging anterior spurring. 7. Aortic atherosclerosis.  Labs/Other Tests and Data Reviewed:    EKG:  EKG is not ordered  today. 01/07/2022: Sinus rhythm. Rate 68 bpm. 05/01/2021: Sinus rhythm.  Rate 62 bpm. 1st degree AV block. 12/30/2020: Sinus rhythm. PACs. Rate 70 bpm.   Recent Labs: 09/21/2021: TSH 5.530 01/07/2022: ALT 16 08/05/2022: BUN 18; Creatinine, Ser 1.21; Hemoglobin 12.1; Platelets 186; Potassium 4.3; Sodium 138   Recent Lipid Panel Lab Results  Component Value Date/Time   CHOL 101 05/07/2022 12:00 AM   CHOL 87 (L) 01/07/2022 09:10 AM   TRIG 68 05/07/2022 12:00 AM   HDL 47 05/07/2022 12:00 AM   HDL 35 (L) 01/07/2022 09:10 AM   CHOLHDL 2.5 01/07/2022 09:10 AM   CHOLHDL 3.0 12/21/2018 05:28 AM   LDLCALC 40 05/07/2022 12:00 AM   LDLCALC 39 01/07/2022 09:10 AM    Wt Readings from Last 3 Encounters:  08/10/22 276 lb 14.4 oz (125.6 kg)  08/05/22 270 lb (122.5 kg)  08/03/22 274 lb 12.8 oz (124.6 kg)     Objective:    VS:  BP (!) 110/56 (BP Location: Left Arm, Patient Position: Sitting, Cuff Size: Large)   Pulse (!) 56   Ht '5\' 10"'$  (1.778 m)   Wt 276 lb 14.4 oz (125.6 kg)   SpO2 94%   BMI 39.73 kg/m  , BMI Body mass index is 39.73 kg/m. GENERAL:  Well appearing HEENT: Pupils equal round and reactive, fundi not visualized, oral mucosa unremarkable NECK:  No JVD.  Waveform within normal limits, carotid upstroke brisk and symmetric, no bruits LUNGS:  Clear to auscultation bilaterally HEART:  RRR.  PMI not displaced or sustained,S1 and S2 within normal limits, no S3, no S4, no clicks, no rubs, no murmurs ABD:  Flat, positive bowel sounds normal in frequency in pitch, no bruits, no rebound, no guarding, no midline pulsatile mass, no hepatomegaly, no splenomegaly. Umbilical hernia EXT:  2 plus pulses throughout, 1+ bilateral LE edema, no cyanosis no clubbing SKIN:  No rashes no nodules NEURO:  Cranial nerves II through XII grossly intact, motor grossly intact throughout PSYCH:  Cognitively intact, oriented to person place and time   ASSESSMENT & PLAN:    Angina, class II [facility]) Mr. Kroening  has been having chest pain for the last several days.  He is also had exertional dyspnea.  Given his history with a 95% stenosis in the LAD status post stenting in 2020, there is concern that this is recurrent angina.  At that time he had perforation with stent placement and subsequent tamponade.  We discussed the fact that this is always a risk for any intervention.  He should not be at any higher risk than anyone else.  He is willing to proceed.  He will hold his Eliquis two days prior.  Continue amlodipine, atorvastatin, Plavix, and metoprolol.  We will have him take aspirin 81 mg for the 2 days while he is holding his Eliquis.  Shared Decision Making/Informed Consent The risks [stroke (1 in 1000), death (1 in 1000), kidney failure [usually temporary] (1 in 500), bleeding (1 in 200), allergic reaction [possibly serious] (1 in 200)], benefits (diagnostic support and management of coronary artery disease) and alternatives of a cardiac catheterization were discussed in detail with Mr. Rodkey and he is willing to proceed.       Chronic diastolic heart failure ([place]) He is euvolemic and doing well.  Blood pressure well controlled.  Essential hypertension Blood pressure stable on amlodipine, losartan, and metoprolol.  Continue torsemide.  OSA (obstructive sleep apnea) Continue CPAP.  Hyperlipidemia LDL goal <70 Lipids well controlled on atorvastatin.  Obesity (BMI 30-39.9) Continue working with healthy weight and wellness  and exercising with the prep program.  He remains on Ozempic.  I suspect he will need a higher dose as he is not having great outcomes with weight loss yet.    Medication Adjustments/Labs and Tests Ordered: Current medicines are reviewed at length with the patient today.  Concerns regarding medicines are outlined above.   Tests Ordered: Orders Placed This Encounter  Procedures   CBC with Differential/Platelet   Basic metabolic panel   Medication Changes: Meds ordered  this encounter  Medications   sodium chloride flush (NS) 0.9 % injection 3 mL   Disposition:   Follow up in 2 months with Laurann Montana, NP Follow up in 6 months with Skeet Latch, MD   I,Alexis Herring,acting as a scribe for Skeet Latch, MD.,have documented all relevant documentation on the behalf of Skeet Latch, MD,as directed by  Skeet Latch, MD while in the presence of Skeet Latch, MD.  I, [place] Oval [name], MD have reviewed all documentation for this visit.  The documentation of the exam, diagnosis, procedures, and orders on 08/10/2022 are all accurate and complete.   Signed, Skeet Latch, MD  08/10/2022 10:42 AM    [place]

## 2022-08-10 NOTE — Patient Instructions (Signed)
Medication Instructions:  Your physician recommends that you continue on your current medications as directed. Please refer to the Current Medication list given to you today.  *If you need a refill on your cardiac medications before your next appointment, please call your pharmacy*  Lab Work: BMET/CBC TODAY   If you have labs (blood work) drawn today and your tests are completely normal, you will receive your results only by: [place] (if you have MyChart) OR A paper copy in the mail If you have any lab test that is abnormal or we need to change your treatment, we will call you to review the results.  Testing/Procedures: Your physician has requested that you have a cardiac catheterization. Cardiac catheterization is used to diagnose and/or treat various heart conditions. Doctors may recommend this procedure for a number of different reasons. The most common reason is to evaluate chest pain. Chest pain can be a symptom of coronary artery disease (CAD), and cardiac catheterization can show whether plaque is narrowing or blocking your heart's arteries. This procedure is also used to evaluate the valves, as well as measure the blood flow and oxygen levels in different parts of your heart. For further information please visit [email or web address]. Please follow instruction sheet, as given.  Follow-Up: At [facility], you and your health needs are our priority.  As part of our continuing mission to provide you with exceptional heart care, we have created designated Provider Care Teams.  These Care Teams include your primary Cardiologist (physician) and Advanced Practice Providers (APPs -  Physician Assistants and Nurse Practitioners) who all work together to provide you with the care you need, when you need it.  We recommend signing up for the patient portal called "MyChart".  Sign up information is provided on this After Visit Summary.  MyChart is used to connect with patients for  Virtual Visits (Telemedicine).  Patients are able to view lab/test results, encounter notes, upcoming appointments, etc.  Non-urgent messages can be sent to your provider as well.   To learn more about what you can do with MyChart, go to [email or web address].    Your next appointment:   2 month(s)  The format for your next appointment:   In Person  Provider:   Laurann Montana, NP   6 MONTHS WITH DR [facility]    Other [address] [place] [id number] Dept: [place].  08/10/2022  You are scheduled for a Cardiac Catheterization on Tuesday, October 31 with Dr. Sherren Mocha.  1. Please arrive at the [facility] (Main Entrance A) at [facility]: [address] [place], [place] [address] at 5:30 AM (This time is two hours before your procedure to ensure your preparation). Free valet parking service is available.   Special note: Every effort is made to have your procedure done on time. Please understand that emergencies sometimes delay scheduled procedures.  2. Diet: Do not eat solid foods after midnight.  The patient may have clear liquids until 5am upon the day of the procedure.  3. Labs: You will need to have blood drawn on TODAY  4. Medication instructions in preparation for your procedure:   Contrast Allergy: No  HOLD ELIQUIS FOR 2 DAYS PRIOR TO CATH TAKE ASPIRIN 81 MG DAILY WHILE HOLDING ELIQUIS   On the morning of your procedure, take your ASPIRIN and any morning medicines NOT listed above.  You may use sips of water.  5. Plan for one night stay--bring personal belongings. 6. Bring a current list of your medications and current insurance cards. 7. You MUST have a responsible person to drive you home. 8. Someone MUST be with you the first 24 hours after you arrive home or your discharge will be delayed. 9. Please wear clothes that are  easy to get on and off and wear slip-on shoes.  Thank you for allowing [place] to care for you!   -- [facility]

## 2022-08-10 NOTE — Assessment & Plan Note (Addendum)
Devon Mathews has been having chest pain for the last several days.  He is also had exertional dyspnea.  Given his history with a 95% stenosis in the LAD status post stenting in 2020, there is concern that this is recurrent angina.  At that time he had perforation with stent placement and subsequent tamponade.  We discussed the fact that this is always a risk for any intervention.  He should not be at any higher risk than anyone else.  He is willing to proceed.  He will hold his Eliquis two days prior.  Continue amlodipine, atorvastatin, Plavix, and metoprolol.  We will have him take aspirin 81 mg for the 2 days while he is holding his Eliquis.  Shared Decision Making/Informed Consent The risks [stroke (1 in 1000), death (1 in 1000), kidney failure [usually temporary] (1 in 500), bleeding (1 in 200), allergic reaction [possibly serious] (1 in 200)], benefits (diagnostic support and management of coronary artery disease) and alternatives of a cardiac catheterization were discussed in detail with Devon Mathews and he is willing to proceed.

## 2022-08-10 NOTE — Assessment & Plan Note (Signed)
Lipids well-controlled on atorvastatin. 

## 2022-08-10 NOTE — Assessment & Plan Note (Signed)
Continue CPAP.  

## 2022-08-10 NOTE — H&P (View-Only) (Signed)
Cardiology Office Note   Date:  08/10/2022   ID:  Devon Glazier., DOB Oct 14, 1951, MRN [id number]  PCP:  Devon Pao, MD  Cardiologist:  None  Electrophysiologist:  None  Sleep: Dr. Claiborne Mathews  Evaluation Performed:  Follow-Up Visit  Chief Complaint:  Follow up  History of Present Illness:    [name] is a 71 y.o. male with CAD s/p NSTEMI and PCI, paroxysmal atrial fibrillation, procedural pericardial effusion/tamponade s/p pericardial window, mild AR, hypertension, hyperlipidemia, OSA and obestiy who presents for follow up.  Devon Mathews was previously seen by Dr. Rollene Mathews and by Dr. Einar Mathews.  He last saw Dr. Einar Mathews 01/2015 and reported mild, chronic dyspnea. He underwent cardiac catheterization in 2003 and 2006 with a 40% LAD lesion noted.  He had an ETT in 2007 that was negative for ischemia and a nuclear stress in 2011 and 2012 that were negative.  He had an echo 07/02/13 that revealed LVEF 55-60% and mild mitral regurgitation. He was seen in clinic 07/2016 and reported several months of chest pain and exertional shortness of breath.  He was referred for exercise Myoview 08/13/16 that revealed LVEF 63% and no ischemia.  He also had an echo 08/16/16 with LVEF 60-65%, mild LVH, mild AR and mild TR.  Devon Mathews had a sleep study in 2014 that demonstrated moderate OSA. He continues to be compliant with CPAP.     Devon Mathews had an NSTEMI 08/2018.  Troponin peaked at 0.15.  He underwent LHC and had a 95% mid LAD lesion that was successfully stented with a DES.  20% distal left main disease was also noted.  LVEF was 65% on left ventriculogram.  Simvastatin was switched to atorvastatin and metoprolol was increased to 50 mg daily.  Plans were made to continue dual antiplatelet therapy for 1 to 3 months followed by ticagrelor alone.  Since his last appointment Devon Mathews, [place] and reported angina.  He was referred for [facility] 12/01/2018 where there was concern for reversible ischemia in  the anterolateral region and infarct with peri-infarct ischemia in the inferolateral and inferoseptal regions.  He subsequently underwent left heart catheterization 12/05/2018 that revealed a 90% ostial D1 lesion.  He underwent successful balloon angioplasty.  However the procedure was complicated by a diagonal wire perforation with subsequent development of a pericardial effusion and tamponade.  He developed hemorrhagic pericarditis and was started on colchicine.  He was discharged but developed A worsening effusion and early tamponade was noted on his echo 12/2018.  He was admitted and underwent pericardial window.  During that time he also developed atrial fibrillation and had a TIA. His torsemide was increased because of volume overload. He followed up with Devon lawrence, DNP 02/2021 and his weight was still increasing. Torsemide was again increased. Repeat Echo 03/2021 revealed LVEF 60-65% with grade 1 diastolic dysfunction. BNP had been normal.  Devon Mathews was taking torsemide once every other day. He had returned to the lower dose after developing some kidney pain when taking extra torsemide. 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 He reported typical home blood pressures in the 110s, with rare 90s or 120s. He had one episode of severe fatigue and found his BP to be in the 51O systolic.  At his last visit he was doing well and working on weight loss. Ozempic was increased and he was referred to prep at the [facility]. He was seen in the  ED 08/05/2022 with sudden onset chest pain. The chest pain awoke him from sleep. He went to cardiac rehab and reported exertional chest pain so they sent him to the ED. Cardiac enzymes were negative. They recommended inpatient management but he felt better and wanted to leave. Chest CT was negative for PE. Today, patient states that he feels that his chest pain may be associated with stress as he has been caring for his wife who is  recovering from RTC surgery and his busy schedule for the few weeks prior. He states that the chest pain initially felt like indigestion in his epigastric region and was accompanied by severe DOE. Patient notes that he never experiences indigestion anymore as he has been compliant with his Protonix regimen. He reports that the chest tightness was intermittent for a few days before he went in for cardiac rehab. Due to his symptoms he was advised to not attempt that day's exercises and was sent to the ED. He continues to feel winded with minimal activity. He states that his shortness of breath has been present for a while and he feels that it is due to his deconditioning and ~15lb weight gain. He resumed Ozempic last week after being off of it for 1 week. He reports that he occasionally drinks alcohol. He last had 2 beers a few days ago but had not had alcohol in quite some time prior to that. He was unaware of the cirrhosis findings on his recent CTA. He does not have any established GI care.   Past Medical History:  Diagnosis Date   Anxiety    Atrial fibrillation (HCC)    CAD S/P percutaneous coronary angioplasty 09/13/2018    99% p-mLAD (BTW d1&d2) -> SYNERGY DES 3.5 X 12 (3.75 mm). 20% LM.  Cx, small RI & co-dom RCA normal.  EF 65%.    Chronic diastolic heart failure ([place]) 11/30/2019   Congestive heart failure (CHF) ([place])    Diabetes mellitus without complication ([place])    Dizziness 2012   normal findings..   Essential hypertension 07/28/2016   GERD (gastroesophageal reflux disease)    Hyperlipidemia    Lower extremity edema 07/28/2016   NSTEMI (non-ST elevated myocardial infarction) ([place]) 09/13/2018   The patient presented 09/13/2018 with a non-ST elevation MI, troponin peak was 0.15   Obesity    Obesity (BMI 30-39.9) 07/28/2016   OSA (obstructive sleep apnea) 07/28/2016   Restless leg syndrome    mild   SCCA (squamous cell carcinoma) of skin 08/24/2021   Left Forehead (in  situ-inflitrating)   Shortness of breath 07/28/2016   Sleep apnea    Squamous cell carcinoma of skin 03/20/2015   right ant scalp(curetx3, 67f)   Squamous cell carcinoma of skin 01/01/2016   left lateral forehead (curetx3, 581f   Squamous cell carcinoma of skin 06/15/2019   left mid anterior scalp (tx afte bx)   Squamous cell carcinoma of skin 01/08/2021   well diff- left forehead   Squamous cell carcinoma of skin 01/08/2021   in situ- mid frontal scalp   TIA (transient ischemic attack)    Tubular adenoma    Past Surgical History:  Procedure Laterality Date   COLONOSCOPY     CORONARY BALLOON ANGIOPLASTY N/A 12/05/2018   Procedure: CORONARY BALLOON ANGIOPLASTY;  Surgeon: HaLeonie ManMD;  [facility];  Service: Cardiovascular;  Laterality: N/A;   CORONARY STENT INTERVENTION N/A 09/13/2018   Procedure: CORONARY STENT INTERVENTION;  Surgeon: SmBelva CromeMD;  Location: [place] [facility];  Service: Cardiovascular;; p-m LAD (btw D1&D2) - SYNERGY DES 3.5 x 12 (3.75 mm).   ESOPHAGOGASTRODUODENOSCOPY (EGD) WITH PROPOFOL N/A 03/01/2022   Procedure: ESOPHAGOGASTRODUODENOSCOPY (EGD) WITH PROPOFOL;  Surgeon: Gatha Mayer, MD;  Location: [facility];  Service: Gastroenterology;  Laterality: N/A;  Polypectomy   GASTRIC VARICES BANDING  03/01/2022   Procedure: GASTRIC  BANDING;  Surgeon: Gatha Mayer, MD;  Location: [facility];  Service: Gastroenterology;;   KNEE ARTHROSCOPY Right    LEFT HEART CATH AND CORONARY ANGIOGRAPHY N/A 09/13/2018   Procedure: LEFT HEART CATH AND CORONARY ANGIOGRAPHY;  Surgeon: Belva Crome, MD;  Location: [place] [facility];  Service: Cardiovascular;;  99% p-mLAD (BTW d1&d2) -> SYNERGY DES 3.5 X 12 (3.75 mm). 20% LM.  Cx, small RI & co-dom RCA normal.  EF 65%.    LEFT HEART CATH AND CORONARY ANGIOGRAPHY  2003   30-40% proximal segmental stenosis in the LAD   LEFT HEART CATH AND CORONARY ANGIOGRAPHY  2006   EF >50% & no mitral  regurgitation   LEFT HEART CATH AND CORONARY ANGIOGRAPHY N/A 12/05/2018   Procedure: LEFT HEART CATH AND CORONARY ANGIOGRAPHY;  Surgeon: [name], MD;  Location: [place] [facility];  Service: Cardiovascular;  Laterality: N/A;   NM MYOVIEW LTD  07/2016   Normal.  EF 55-65% 63%).  No ischemia or infarction.  LOW RISK   NM MYOVIEW LTD  11/2018   6: 34 min.  7.2 METS.  Noted chest pain and tightness.  Horizontal ST of T wave depression (2 mm) II, 3, V5 and V6 (HIGH RISK), large/severe defect basal-apical inferior, inferolateral wall.  Large-severe basal-apical anterior-anterolateral wall.  Medium/moderate defect in basal and mid inferoseptal wall.  Consistent with large prior infarct with peri-infarct ischemia.  HIGH RISK   PERICARDIOCENTESIS N/A 12/05/2018   Procedure: PERICARDIOCENTESIS;  Surgeon: [name], MD;  Location: [place] [facility];  Service: Cardiovascular;  Laterality: N/A;   SUBXYPHOID PERICARDIAL WINDOW N/A 12/25/2018   Procedure: SUBXYPHOID PERICARDIAL WINDOW;  Surgeon: [name], MD;  Location: [facility];  Service: Thoracic;  Laterality: N/A;   TEE WITHOUT CARDIOVERSION N/A 12/25/2018   Procedure: TRANSESOPHAGEAL ECHOCARDIOGRAM (TEE);  Surgeon: [name], MD;  Location[facility];  Service: Thoracic;  Laterality: N/A;   tendon detachment Right    arm   TRANSTHORACIC ECHOCARDIOGRAM  07/2016   EF 60-65% with mild LVH.  Mild AI.  Trivial MR and TR.   VASECTOMY       Current Meds  Medication Sig   acetaminophen (TYLENOL) 500 MG tablet Take 500-1,000 mg by mouth every 6 (six) hours as needed for moderate pain.   amitriptyline (ELAVIL) 25 MG tablet Take 0.5 tablets (12.5 mg total) by mouth at bedtime as needed for sleep.   amLODipine (NORVASC) 2.5 MG tablet Take 1 tablet (2.5 mg total) by mouth daily.   atorvastatin (LIPITOR) 80 MG tablet TAKE 1 TABLET BY MOUTH EVERY DAY   Cholecalciferol (VITAMIN D) 125 MCG (5000 UT) CAPS Take 5,000 Units by mouth daily.    clopidogrel (PLAVIX) 75 MG tablet TAKE 1 TABLET BY MOUTH EVERY DAY   ELIQUIS 5 MG TABS tablet Take 1 tablet (5 mg total) by mouth 2 (two) times daily.   escitalopram (LEXAPRO) 10 MG tablet Take 10 mg by mouth daily.   losartan (COZAAR) 50 MG tablet Take 1 tablet (50 mg total) by mouth daily.   metoprolol succinate (TOPROL-XL) 50 MG 24  hr tablet TAKE 1 TABLET BY MOUTH EVERY DAY WITH OR IMMEDIATELY FOLLOWING A MEAL (Patient taking differently: Take 50 mg by mouth daily.)   nitroGLYCERIN (NITROSTAT) 0.4 MG SL tablet Place 1 tablet (0.4 mg total) under the tongue every 5 (five) minutes as needed for chest pain.   pantoprazole (PROTONIX) 40 MG tablet TAKE ONE TABLET BY MOUTH ONE TIME DAILY. must make follow up appointment for further refills (Patient taking differently: Take 40 mg by mouth daily.)   potassium chloride SA (KLOR-CON M) 20 MEQ tablet Take 20 mEq by mouth daily as needed (when taking furosemide).   Semaglutide,0.25 or 0.'5MG'$ /DOS, (OZEMPIC, 0.25 OR 0.5 MG/DOSE,) 2 MG/3ML SOPN Inject into the skin once a week.   torsemide (DEMADEX) 20 MG tablet Take 20 mg by mouth 2 (two) times daily as needed.   Current Facility-Administered Medications for the 08/10/22 encounter (Office Visit) with Skeet Latch, MD  Medication   sodium chloride flush (NS) 0.9 % injection 3 mL     Allergies:   Penicillins   Social History   Tobacco Use   Smoking status: Never   Smokeless tobacco: Never  Vaping Use   Vaping Use: Never used  Substance Use Topics   Alcohol use: No   Drug use: No     Family Hx: The patient's family history includes COPD in his father; Diabetes in his brother, mother, and sister; Heart attack in his paternal grandmother and sister; Heart disease in his father and mother; Heart failure in his father, mother, and sister; Hypertension in his mother; Other in his father and maternal grandfather; Pancreatic cancer in his paternal aunt and paternal uncle; Stroke in his father. There is  no history of Colon cancer, Esophageal cancer, Stomach cancer, or Rectal cancer.  ROS:   Please see the history of present illness.    (+) Dyspnea on exertion (+) Chest tightness All other systems reviewed and are negative.   Prior CV studies:   The following studies were reviewed today:  Lexiscan Myoview 12/01/18: Nuclear stress EF: 54%. The left ventricular ejection fraction is mildly decreased (45-54%). Horizontal ST segment depression ST segment depression of 2 mm was noted during stress in the II, III, V5 and V6 leads, beginning at 8 minutes of stressST deviation beginning in recovery. Defect 1: There is a large defect of severe severity present in the basal inferior, basal inferolateral, mid inferior, mid inferolateral, apical inferior, apical lateral and apex location. Defect 2: There is a large defect of severe severity present in the basal anterior, basal anterolateral, mid anterior, mid anterolateral, apical anterior and apex location. Defect 3: There is a medium defect of moderate severity present in the basal inferoseptal and mid inferoseptal location. Findings consistent with ischemia and prior myocardial infarction with peri-infarct ischemia. This is a high risk study. There is a medium size, severe perfusion defect in the inferior and inferolateral walls with stress that is partially reversible at rest, returning to moderate. Base to apex. There is a absent, large perfusion defect in the anterior wall from base to apex. It is partially reversible at rest and returns to moderate at the base and mid ventricle and returns to mild at the apex. Anterolateral defect is severe, and with full reversibility. Medium size, moderate perfusion defect in the inferoseptum with partial reversibility, returning to mild at rest.   LHC 12/05/18: SUMMARY Culprit lesion:  90% ostial 1st Diag (jailed) -successful scoring balloon angioplasty using 2.5 mm Wolverine scoring balloon --Distal diagonal  wire perforation  with pericardial effusion and initial stages of pericardial tamponade --> successful occlusion with prolonged balloon inflations and administration of protamine --Unsuccessful pericardiocentesis Widely patent LAD stent Otherwise stable coronary arteries. Normal LVEF and EDP pre-PCI Clear ST elevations for roughly 15 minutes, most likely type for MI.    Echo 12/06/18: IMPRESSIONS  1. The left ventricle has normal systolic function, with an ejection fraction of 55-60%. The cavity size was normal. Left ventricular diastolic parameters were normal.  2. Small pericardial effusion seen at the apex, lateral wall and anterior to RV.  3. The mitral valve is degenerative. Mild thickening of the mitral valve leaflet. Mild calcification of the mitral valve leaflet.  4. The aortic valve is tricuspid There is Moderate thickening of the aortic valve There is Mild calcification of the aortic valveAortic valve regurgitation is trivial by color flow Doppler.  Echo 12/21/18: IMPRESSIONS  1. The left ventricle has hyperdynamic systolic function, with an ejection fraction of >65%. Left ventricular diastology could not be evaluated due to nondiagnostic images.  2. The mitral valve is normal in structure.  3. Aortic valve regurgitation is mild by color flow Doppler.  4. Moderate pericardial effusion.  5. The pericardial effusion is circumferential.  6. There is inversion of the right ventricular wall, inversion of the right atrial wall and excessive respiratory variation in the mitral valve spectral Doppler velocities.  7. When compared to the prior study: Compared to study 11/2018, the pericardial effusion has incrased in size and now significant effusion present posterior as well as anteriorly. There is RA inversion and suggestive of subtle intermittent RV diastolic  collapse. There is some respiratory variation in the [facility] inlow pattern with respirations. The IVC is not well visualized. Concern for  early tamponade.   Carotid Doppler 12/21/18: Minimal plaque bilaterally.  CT Angio Chest Aorta 07/10/2020: 1. Negative for acute PE or thoracic aortic dissection. 2. Dilated central pulmonary arteries suggesting pulmonary arterial hypertension. 3. Coronary calcifications. The severity of coronary artery disease and any potential stenosis cannot be assessed on this non-gated CT examination.  Echo 03/27/2021:  1. Left ventricular ejection fraction, by estimation, is 60 to 65%. The  left ventricle has normal function. The left ventricle has no regional  wall motion abnormalities. Left ventricular diastolic parameters were  normal.   2. Right ventricular systolic function is normal. The right ventricular  size is moderately enlarged. There is normal pulmonary artery systolic  pressure. The estimated right ventricular systolic pressure is 44.0 mmHg.   3. The mitral valve is normal in structure. Trivial mitral valve  regurgitation. No evidence of mitral stenosis.   4. The aortic valve is tricuspid. Aortic valve regurgitation is trivial.  No aortic stenosis is present.   5. The inferior vena cava is normal in size with greater than 50%  respiratory variability, suggesting right atrial pressure of 3 mmHg.  Comparison(s): No significant change from prior study.   CTA Chest 08/05/2022: IMPRESSION: 1. No filling defect is identified in the pulmonary arterial tree to suggest pulmonary embolus. 2. Coronary, aortic arch, and branch vessel atherosclerotic vascular disease. Mild cardiomegaly. 3. Stable mild prominence of the central pulmonary arteries which may reflect pulmonary arterial hypertension. 4. Nodular hepatic contour suspicious for cirrhosis. 5. Small upper abdominal ventral hernia containing adipose tissue. 6. Substantial thoracic spondylosis with multilevel bridging anterior spurring. 7. Aortic atherosclerosis.  Labs/Other Tests and Data Reviewed:    EKG:  EKG is not ordered  today. 01/07/2022: Sinus rhythm. Rate 68 bpm. 05/01/2021: Sinus rhythm.  Rate 62 bpm. 1st degree AV block. 12/30/2020: Sinus rhythm. PACs. Rate 70 bpm.   Recent Labs: 09/21/2021: TSH 5.530 01/07/2022: ALT 16 08/05/2022: BUN 18; Creatinine, Ser 1.21; Hemoglobin 12.1; Platelets 186; Potassium 4.3; Sodium 138   Recent Lipid Panel Lab Results  Component Value Date/Time   CHOL 101 05/07/2022 12:00 AM   CHOL 87 (L) 01/07/2022 09:10 AM   TRIG 68 05/07/2022 12:00 AM   HDL 47 05/07/2022 12:00 AM   HDL 35 (L) 01/07/2022 09:10 AM   CHOLHDL 2.5 01/07/2022 09:10 AM   CHOLHDL 3.0 12/21/2018 05:28 AM   LDLCALC 40 05/07/2022 12:00 AM   LDLCALC 39 01/07/2022 09:10 AM    Wt Readings from Last 3 Encounters:  08/10/22 276 lb 14.4 oz (125.6 kg)  08/05/22 270 lb (122.5 kg)  08/03/22 274 lb 12.8 oz (124.6 kg)     Objective:    VS:  BP (!) 110/56 (BP Location: Left Arm, Patient Position: Sitting, Cuff Size: Large)   Pulse (!) 56   Ht '5\' 10"'$  (1.778 m)   Wt 276 lb 14.4 oz (125.6 kg)   SpO2 94%   BMI 39.73 kg/m  , BMI Body mass index is 39.73 kg/m. GENERAL:  Well appearing HEENT: Pupils equal round and reactive, fundi not visualized, oral mucosa unremarkable NECK:  No JVD.  Waveform within normal limits, carotid upstroke brisk and symmetric, no bruits LUNGS:  Clear to auscultation bilaterally HEART:  RRR.  PMI not displaced or sustained,S1 and S2 within normal limits, no S3, no S4, no clicks, no rubs, no murmurs ABD:  Flat, positive bowel sounds normal in frequency in pitch, no bruits, no rebound, no guarding, no midline pulsatile mass, no hepatomegaly, no splenomegaly. Umbilical hernia EXT:  2 plus pulses throughout, 1+ bilateral LE edema, no cyanosis no clubbing SKIN:  No rashes no nodules NEURO:  Cranial nerves II through XII grossly intact, motor grossly intact throughout PSYCH:  Cognitively intact, oriented to person place and time   ASSESSMENT & PLAN:    Angina, class II [facility]) Devon Mathews  has been having chest pain for the last several days.  He is also had exertional dyspnea.  Given his history with a 95% stenosis in the LAD status post stenting in 2020, there is concern that this is recurrent angina.  At that time he had perforation with stent placement and subsequent tamponade.  We discussed the fact that this is always a risk for any intervention.  He should not be at any higher risk than anyone else.  He is willing to proceed.  He will hold his Eliquis two days prior.  Continue amlodipine, atorvastatin, Plavix, and metoprolol.  We will have him take aspirin 81 mg for the 2 days while he is holding his Eliquis.  Shared Decision Making/Informed Consent The risks [stroke (1 in 1000), death (1 in 1000), kidney failure [usually temporary] (1 in 500), bleeding (1 in 200), allergic reaction [possibly serious] (1 in 200)], benefits (diagnostic support and management of coronary artery disease) and alternatives of a cardiac catheterization were discussed in detail with Devon Mathews and he is willing to proceed.       Chronic diastolic heart failure ([place]) He is euvolemic and doing well.  Blood pressure well controlled.  Essential hypertension Blood pressure stable on amlodipine, losartan, and metoprolol.  Continue torsemide.  OSA (obstructive sleep apnea) Continue CPAP.  Hyperlipidemia LDL goal <70 Lipids well controlled on atorvastatin.  Obesity (BMI 30-39.9) Continue working with healthy weight and wellness  and exercising with the prep program.  He remains on Ozempic.  I suspect he will need a higher dose as he is not having great outcomes with weight loss yet.    Medication Adjustments/Labs and Tests Ordered: Current medicines are reviewed at length with the patient today.  Concerns regarding medicines are outlined above.   Tests Ordered: Orders Placed This Encounter  Procedures   CBC with Differential/Platelet   Basic metabolic panel   Medication Changes: Meds ordered  this encounter  Medications   sodium chloride flush (NS) 0.9 % injection 3 mL   Disposition:   Follow up in 2 months with Laurann Montana, NP Follow up in 6 months with Skeet Latch, MD   I,Alexis Herring,acting as a scribe for Skeet Latch, MD.,have documented all relevant documentation on the behalf of Skeet Latch, MD,as directed by  Skeet Latch, MD while in the presence of Skeet Latch, MD.  I, [place] [name], MD have reviewed all documentation for this visit.  The documentation of the exam, diagnosis, procedures, and orders on 08/10/2022 are all accurate and complete.   Signed, Skeet Latch, MD  08/10/2022 10:42 AM    [place]

## 2022-08-10 NOTE — Assessment & Plan Note (Signed)
He is euvolemic and doing well.  Blood pressure well controlled.

## 2022-08-10 NOTE — Assessment & Plan Note (Signed)
Continue working with healthy weight and wellness and exercising with the prep program.  He remains on Ozempic.  I suspect he will need a higher dose as he is not having great outcomes with weight loss yet.

## 2022-08-11 LAB — CBC WITH DIFFERENTIAL/PLATELET
Basophils Absolute: 0 10*3/uL (ref 0.0–0.2)
Basos: 1 %
EOS (ABSOLUTE): 0.2 10*3/uL (ref 0.0–0.4)
Eos: 5 %
Hematocrit: 38.4 % (ref 37.5–51.0)
Hemoglobin: 12.5 g/dL — ABNORMAL LOW (ref 13.0–17.7)
Immature Grans (Abs): 0 10*3/uL (ref 0.0–0.1)
Immature Granulocytes: 0 %
Lymphocytes Absolute: 1.2 10*3/uL (ref 0.7–3.1)
Lymphs: 31 %
MCH: 31.2 pg (ref 26.6–33.0)
MCHC: 32.6 g/dL (ref 31.5–35.7)
MCV: 96 fL (ref 79–97)
Monocytes Absolute: 0.5 10*3/uL (ref 0.1–0.9)
Monocytes: 12 %
Neutrophils Absolute: 2 10*3/uL (ref 1.4–7.0)
Neutrophils: 51 %
Platelets: 176 10*3/uL (ref 150–450)
RBC: 4.01 x10E6/uL — ABNORMAL LOW (ref 4.14–5.80)
RDW: 15.6 % — ABNORMAL HIGH (ref 11.6–15.4)
WBC: 3.9 10*3/uL (ref 3.4–10.8)

## 2022-08-11 LAB — BASIC METABOLIC PANEL
BUN/Creatinine Ratio: 16 (ref 10–24)
BUN: 18 mg/dL (ref 8–27)
CO2: 21 mmol/L (ref 20–29)
Calcium: 9 mg/dL (ref 8.6–10.2)
Chloride: 107 mmol/L — ABNORMAL HIGH (ref 96–106)
Creatinine, Ser: 1.12 mg/dL (ref 0.76–1.27)
Glucose: 115 mg/dL — ABNORMAL HIGH (ref 70–99)
Potassium: 4.8 mmol/L (ref 3.5–5.2)
Sodium: 143 mmol/L (ref 134–144)
eGFR: 71 mL/min/{1.73_m2} (ref >59)

## 2022-08-15 ENCOUNTER — Emergency Department (HOSPITAL_COMMUNITY): Payer: Medicare HMO

## 2022-08-15 ENCOUNTER — Other Ambulatory Visit: Payer: Self-pay

## 2022-08-15 ENCOUNTER — Emergency Department (HOSPITAL_COMMUNITY)
Admission: EM | Admit: 2022-08-15 | Discharge: 2022-08-15 | Disposition: A | Discharge status: Home / Self Care | Payer: Medicare HMO | Attending: Emergency Medicine | Admitting: Emergency Medicine

## 2022-08-15 ENCOUNTER — Encounter (HOSPITAL_COMMUNITY): Payer: Self-pay | Admitting: Emergency Medicine

## 2022-08-15 DIAGNOSIS — W19XXXA Unspecified fall, initial encounter: Secondary | ICD-10-CM | POA: Diagnosis not present

## 2022-08-15 DIAGNOSIS — Z7984 Long term (current) use of oral hypoglycemic drugs: Secondary | ICD-10-CM | POA: Insufficient documentation

## 2022-08-15 DIAGNOSIS — W010XXA Fall on same level from slipping, tripping and stumbling without subsequent striking against object, initial encounter: Secondary | ICD-10-CM | POA: Insufficient documentation

## 2022-08-15 DIAGNOSIS — R000 Tachycardia, unspecified: Secondary | ICD-10-CM | POA: Diagnosis not present

## 2022-08-15 DIAGNOSIS — M25561 Pain in right knee: Secondary | ICD-10-CM

## 2022-08-15 DIAGNOSIS — Z23 Encounter for immunization: Secondary | ICD-10-CM | POA: Diagnosis not present

## 2022-08-15 DIAGNOSIS — S8991XA Unspecified injury of right lower leg, initial encounter: Secondary | ICD-10-CM | POA: Diagnosis present

## 2022-08-15 DIAGNOSIS — I2510 Atherosclerotic heart disease of native coronary artery without angina pectoris: Secondary | ICD-10-CM | POA: Insufficient documentation

## 2022-08-15 DIAGNOSIS — E119 Type 2 diabetes mellitus without complications: Secondary | ICD-10-CM | POA: Diagnosis not present

## 2022-08-15 DIAGNOSIS — M25551 Pain in right hip: Secondary | ICD-10-CM

## 2022-08-15 DIAGNOSIS — Z7901 Long term (current) use of anticoagulants: Secondary | ICD-10-CM | POA: Diagnosis not present

## 2022-08-15 DIAGNOSIS — M1611 Unilateral primary osteoarthritis, right hip: Secondary | ICD-10-CM | POA: Diagnosis not present

## 2022-08-15 DIAGNOSIS — S81011A Laceration without foreign body, right knee, initial encounter: Secondary | ICD-10-CM | POA: Diagnosis not present

## 2022-08-15 DIAGNOSIS — Z743 Need for continuous supervision: Secondary | ICD-10-CM | POA: Diagnosis not present

## 2022-08-15 DIAGNOSIS — R58 Hemorrhage, not elsewhere classified: Secondary | ICD-10-CM | POA: Diagnosis not present

## 2022-08-15 DIAGNOSIS — R609 Edema, unspecified: Secondary | ICD-10-CM | POA: Diagnosis not present

## 2022-08-15 MED ORDER — LIDOCAINE-EPINEPHRINE (PF) 2 %-1:200000 IJ SOLN
20.0000 mL | Freq: Once | INTRAMUSCULAR | Status: AC
  Administered 2022-08-15: 20 mL
  Filled 2022-08-15: qty 20

## 2022-08-15 MED ORDER — TETANUS-DIPHTH-ACELL PERTUSSIS 5-2.5-18.5 LF-MCG/0.5 IM SUSY
0.5000 mL | PREFILLED_SYRINGE | Freq: Once | INTRAMUSCULAR | Status: AC
  Administered 2022-08-15: 0.5 mL via INTRAMUSCULAR
  Filled 2022-08-15: qty 0.5

## 2022-08-15 MED ORDER — CEPHALEXIN 500 MG PO CAPS
500.0000 mg | ORAL_CAPSULE | Freq: Once | ORAL | Status: AC
  Administered 2022-08-15: 500 mg via ORAL
  Filled 2022-08-15: qty 1

## 2022-08-15 MED ORDER — BACITRACIN ZINC 500 UNIT/GM EX OINT
TOPICAL_OINTMENT | Freq: Once | CUTANEOUS | Status: AC
  Filled 2022-08-15: qty 0.9

## 2022-08-15 MED ORDER — BACITRACIN ZINC 500 UNIT/GM EX OINT: 1.0000 | TOPICAL_OINTMENT | Freq: Two times a day (BID) | CUTANEOUS | 0 refills | Status: DC

## 2022-08-15 MED ORDER — ACETAMINOPHEN 500 MG PO TABS
1000.0000 mg | ORAL_TABLET | Freq: Once | ORAL | Status: AC
  Administered 2022-08-15: 1000 mg via ORAL
  Filled 2022-08-15: qty 2

## 2022-08-15 MED ORDER — CEPHALEXIN 500 MG PO CAPS: 500.0000 mg | ORAL_CAPSULE | Freq: Four times a day (QID) | ORAL | 0 refills | Status: DC

## 2022-08-15 NOTE — Discharge Instructions (Addendum)
Sutured repair Keep the laceration site dry for the next 24 hours and leave the dressing in place. After 24 hours you may remove the dressing and gently clean the laceration site with antibacterial soap and warm water. Do not scrub the area. Do not soak the area and water for long periods of time. Don't use hydrogen peroxide, iodine-based solutions, or alcohol, which can slow healing, and will probably be painful! Apply topical bacitracin 1-2 times per day for the next 3-5 days. Please see PCP in 5 to 7 days days for removal of the sutures.  You should return sooner for any signs of infection which would include increased redness around the wound, increased swelling, new drainage of yellow pus.    Adhesive tape repair Tape strips have the advantage of being less painful to apply and lower risk of infection, but do require more caution on your part, as they are more fragile than other skin closure techniques.  It's important to: -Keep the tape clean and dry -Avoid picking at the tape or rubbing the area -Avoid soaking in water (showering is okay-bathing is not) -The tape strips will fall off on their own in about 5-7 days (if they don't, you can gently remove them or soak the wound in water at this time to loosen them).  At this time, scar tissue will be forming under the surface of the wound and your body will do the rest of the work of healing.

## 2022-08-15 NOTE — ED Notes (Signed)
Suture cart at bedside 

## 2022-08-15 NOTE — ED Triage Notes (Signed)
Pt arrived via [facility] c/o a fall, he tripped over a bench, 3-4in laceration on R knee, denied any pain, denies LOC or hitting head. Denies any pain at this time. Pt is supposed to have a heart catheretization next week

## 2022-08-15 NOTE — ED Notes (Signed)
Bandage applied to R knee

## 2022-08-15 NOTE — ED Provider Notes (Signed)
[facility] EMERGENCY DEPARTMENT Provider Note   CSN: [id number] Arrival date & time: 08/15/22  1135     History  Chief Complaint  Patient presents with   Fall   HPI Devon Mathews. is a 71 y.o. male with CAD, history of NSTEMI, history of TIA, and type 2 diabetes presenting for fall.  States that this morning around 1030 he was working on his deck.  He tripped over the leg of a table saw and landed on his right knee.  Denies head injury and loss of consciousness.  States that he sustained an approximately 2 inch laceration just under the right knee joint.  States that there has been "no bleeding at all".  Patient states that the knee is tender but not painful.  Also states that range of motion and sensation is intact in the right knee and lower leg.  Called EMS because patient is on blood thinners who transferred here for further evaluation.  Patient did state that he is having a scheduled heart cath this Tuesday for ongoing chest pain.  States that he is not actively having chest pain or shortness of breath at this time.    Fall       Home Medications Prior to Admission medications   Medication Sig Start Date End Date Taking? Authorizing Provider  cephALEXin (KEFLEX) 500 MG capsule Take 1 capsule (500 mg total) by mouth 4 (four) times daily. 08/15/22  Yes Harriet Pho, PA-C  acetaminophen (TYLENOL) 500 MG tablet Take 500-1,000 mg by mouth every 6 (six) hours as needed for moderate pain.    [provider]  amitriptyline (ELAVIL) 25 MG tablet Take 0.5 tablets (12.5 mg total) by mouth at bedtime as needed for sleep. 05/26/21   Laqueta Linden, MD  amLODipine (NORVASC) 2.5 MG tablet Take 1 tablet (2.5 mg total) by mouth daily. 01/07/22   Skeet Latch, MD  atorvastatin (LIPITOR) 80 MG tablet TAKE 1 TABLET BY MOUTH EVERY DAY 05/11/22   Skeet Latch, MD  Cholecalciferol (VITAMIN D) 125 MCG (5000 UT) CAPS Take 5,000 Units by mouth daily.    [provider]   clopidogrel (PLAVIX) 75 MG tablet TAKE 1 TABLET BY MOUTH EVERY DAY 04/06/22   Skeet Latch, MD  ELIQUIS 5 MG TABS tablet Take 1 tablet (5 mg total) by mouth 2 (two) times daily. 05/05/22   Skeet Latch, MD  escitalopram (LEXAPRO) 10 MG tablet Take 10 mg by mouth daily.    [provider]  losartan (COZAAR) 50 MG tablet Take 1 tablet (50 mg total) by mouth daily. 10/28/21 08/10/22  Skeet Latch, MD  metoprolol succinate (TOPROL-XL) 50 MG 24 hr tablet TAKE 1 TABLET BY MOUTH EVERY DAY WITH OR IMMEDIATELY FOLLOWING A MEAL Patient taking differently: Take 50 mg by mouth daily. 12/07/21   Skeet Latch, MD  nitroGLYCERIN (NITROSTAT) 0.4 MG SL tablet Place 1 tablet (0.4 mg total) under the tongue every 5 (five) minutes as needed for chest pain. 08/06/22   Skeet Latch, MD  pantoprazole (PROTONIX) 40 MG tablet TAKE ONE TABLET BY MOUTH ONE TIME DAILY. must make follow up appointment for further refills Patient taking differently: Take 40 mg by mouth daily. 10/07/14   [name], MD  potassium chloride SA (KLOR-CON M) 20 MEQ tablet Take 20 mEq by mouth daily as needed (when taking furosemide).    [provider]  Semaglutide,0.25 or 0.'5MG'$ /DOS, (OZEMPIC, 0.25 OR 0.5 MG/DOSE,) 2 MG/3ML SOPN Inject into the skin once a  week.    [provider]  torsemide (DEMADEX) 20 MG tablet Take 20 mg by mouth 2 (two) times daily as needed.    [provider]      Allergies    Penicillins    Review of Systems   Review of Systems  Musculoskeletal:        Right knee pain  Skin:  Positive for wound.    Physical Exam Updated Vital Signs BP 134/72   Pulse (!) 54   Temp 98.3 F (36.8 C) (Oral)   Resp 11   Ht '5\' 10"'$  (1.778 m)   Wt 125.6 kg   SpO2 98%   BMI 39.73 kg/m  Physical Exam Vitals and nursing note reviewed.  HENT:     Head: Normocephalic and atraumatic.     Mouth/Throat:     Mouth: Mucous membranes are moist.  Eyes:     General:         Right eye: No discharge.        Left eye: No discharge.     Conjunctiva/sclera: Conjunctivae normal.  Cardiovascular:     Rate and Rhythm: Normal rate and regular rhythm.     Pulses: Normal pulses.          Dorsalis pedis pulses are 2+ on the right side and 2+ on the left side.     Heart sounds: Normal heart sounds.  Pulmonary:     Effort: Pulmonary effort is normal.     Breath sounds: Normal breath sounds.  Abdominal:     General: Abdomen is flat.     Palpations: Abdomen is soft.  Musculoskeletal:     Comments: Full range of motion of the lower extremities is normal.  Right leg is neurovascularly intact.  Skin:    General: Skin is warm and dry.  Neurological:     General: No focal deficit present.  Psychiatric:        Mood and Affect: Mood normal.     ED Results / Procedures / Treatments   Labs (all labs ordered are listed, but only abnormal results are displayed) Labs Reviewed - No data to display  EKG None  Radiology DG Hip Unilat With Pelvis 2-3 Views Right  Result Date: 08/15/2022 CLINICAL DATA:  Fall with pain. EXAM: DG HIP (WITH OR WITHOUT PELVIS) 2-3V RIGHT COMPARISON:  CT abdomen pelvis dated 12/23 2016. FINDINGS: There is no evidence of hip fracture or dislocation. Mild-to-moderate degenerative changes are seen in both hips. IMPRESSION: 1. No acute osseous injury. Electronically Signed   By: Zerita Boers M.D.   On: 08/15/2022 14:18   DG Knee Complete 4 Views Right  Result Date: 08/15/2022 CLINICAL DATA:  Tripped over a bench.  Laceration to the right knee. EXAM: RIGHT KNEE - COMPLETE 4+ VIEW COMPARISON:  None Available. FINDINGS: No fracture.  No bone lesion. Moderate medial joint space compartment narrowing with mild subchondral sclerosis and prominent marginal osteophytes. Smaller osteophytes noted from the patella and lateral compartment. Well-corticated osseous protrusions from the tibial tuberosity, also along the anterior aspect of the patella, chronic. No  significant joint effusion. No radiopaque foreign body. IMPRESSION: 1. No fracture.  No radiopaque foreign body. 2. Moderate osteoarthritis. Electronically Signed   By: Lajean Manes M.D.   On: 08/15/2022 12:44    Procedures .Marland KitchenLaceration Repair  Date/Time: 08/15/2022 3:36 PM  Performed by: Harriet Pho, PA-C Authorized by: Harriet Pho, PA-C   Consent:    Consent obtained:  Verbal   Consent  given by:  Patient   Risks discussed:  Infection and pain Universal protocol:    Procedure explained and questions answered to patient or proxy's satisfaction: yes     Relevant documents present and verified: yes     Patient identity confirmed:  Verbally with patient and arm band Anesthesia:    Anesthesia method:  Local infiltration   Local anesthetic:  Lidocaine 2% WITH epi Laceration details:    Location: right knee.   Length (cm):  5   Depth (mm):  5 Pre-procedure details:    Preparation:  Patient was prepped and draped in usual sterile fashion Exploration:    Hemostasis achieved with:  Epinephrine and direct pressure   Imaging obtained: x-ray     Imaging outcome: foreign body not noted     Wound exploration: wound explored through full range of motion     Wound extent: areolar tissue violated   Treatment:    Area cleansed with:  Saline   Amount of cleaning:  Extensive   Irrigation solution:  Sterile water   Irrigation method:  Pressure wash   Visualized foreign bodies/material removed: yes (Particulates of likely dirt)     Debridement:  Minimal   Undermining:  None   Scar revision: no   Skin repair:    Repair method:  Sutures   Suture size:  3-0   Suture material:  Prolene   Number of sutures:  6 Approximation:    Approximation:  Close Repair type:    Repair type:  Simple Post-procedure details:    Dressing:  Antibiotic ointment   Procedure completion:  Tolerated well, no immediate complications     Medications Ordered in ED Medications  cephALEXin (KEFLEX)  capsule 500 mg (has no administration in time range)  bacitracin ointment (has no administration in time range)  lidocaine-EPINEPHrine (XYLOCAINE W/EPI) 2 %-1:200000 (PF) injection 20 mL (20 mLs Infiltration Given by Other 08/15/22 1445)  Tdap (BOOSTRIX) injection 0.5 mL (0.5 mLs Intramuscular Given 08/15/22 1421)  acetaminophen (TYLENOL) tablet 1,000 mg (1,000 mg Oral Given 08/15/22 1419)    ED Course/ Medical Decision Making/ A&P                           Medical Decision Making Amount and/or Complexity of Data Reviewed Radiology: ordered.  Risk OTC drugs. Prescription drug management.   This patient presents to the ED for concern of fall, this involves a number of treatment options, and is a complaint that carries with it a high risk of complications and morbidity.  The differential diagnosis includes knee fracture or dislocation, hip fracture or dislocation, active hemorrhage and laceration.   Co morbidities: Discussed in HPI    EMR reviewed including pt PMHx, past surgical history and past visits to ER.   See HPI for more details   Lab Tests:   No labs ordered   Imaging Studies:  Abnormal findings. I personally reviewed all imaging studies. Imaging notable for moderate arthritis of the right knee.    Cardiac Monitoring:  The patient was maintained on a cardiac monitor.  I personally viewed and interpreted the cardiac monitored which showed an underlying rhythm of: NSR NA   Medicines ordered:  I ordered medication including lidocaine with epi for local anesthetic and bleeding at the knee laceration.  Tdap booster for prophylaxis.  Tylenol for pain.  Keflex for prophylactic treatment of infection related to laceration. Reevaluation of the patient after these medicines showed that the patient  improved I have reviewed the patients home medicines and have made adjustments as needed    Consults/Attending Physician   I discussed this case with my attending  physician who cosigned this note including patient's presenting symptoms, physical exam, and planned diagnostics and interventions. Attending physician stated agreement with plan or made changes to plan which were implemented.   Reevaluation:  After the interventions noted above I re-evaluated patient and found that they have :improved   Problem List / ED Course: Patient presented for evaluation status post fall this morning.  Fall was mechanical based on history.  Patient denied loss of consciousness and head injury.  Patient is on blood thinners.  Immediate concern was active hemorrhage.  Upon inspection of the knee laceration was no active bleeding.  Vitals were stable.  Patient was not hypoxic or short of breath with normal hemoglobin on CBC making active hemorrhage unlikely.  Patient did endorse both right knee pain and hip pain.  Considered dislocation or fracture of both hip and knee but unlikely given reassuring x-rays.  Laceration repair was well-tolerated without complications.  Minor bleeding during the repair but achieved hemostasis after sutures placed.  Applied bacitracin to the the wound and bandaged with nonadherent dressing.  Also started patient on Keflex for empiric treatment of infection related to his wound.  Visualized particulate matter likely dirt in the wound and patient is scheduled for cath in the next week.  For this reason, felt empiric antibiotic treatment was warranted.   Dispostion:  After consideration of the diagnostic results and the patients response to treatment, I feel that the patient would benefit from discharge and follow-up with PCP in to reassess wound in 5 to 7 days and likely suture removal long with 5 days of Keflex for empiric treatment of infection related to the laceration.         Final Clinical Impression(s) / ED Diagnoses Final diagnoses:  Fall, initial encounter  Pain of right hip  Acute pain of right knee  Laceration of right knee,  initial encounter    Rx / DC Orders ED Discharge Orders          Ordered    cephALEXin (KEFLEX) 500 MG capsule  4 times daily        08/15/22 1527              Harriet Pho, PA-C 08/15/22 1541    Tretha Sciara, MD 08/16/22 1500

## 2022-08-16 ENCOUNTER — Telehealth: Payer: Self-pay | Admitting: *Deleted

## 2022-08-16 NOTE — Telephone Encounter (Signed)
Patient advised okay to proceed with cath 08/17/22.

## 2022-08-16 NOTE — Telephone Encounter (Signed)
Patient reports while working on his deck yesterday, he tripped and cut his right knee in front right below knee cap. Patient reports he went to ED, had 6 stitches placed, given Keflex for 5 days, had Tetanus shot.   Patient denies any signs of infection or active bleeding at laceration site, denies pain/tingling/numbness right lower extremity or foot. Patient wants to proceed with cath tomorrow and asking if okay.  Patient advised should be okay to proceed, but I will forward to Dr Oval [name] to make her aware, see if she agrees with proceeding with cath 08/17/22, or if she has other recommendations.

## 2022-08-16 NOTE — Telephone Encounter (Signed)
Cardiac Catheterization scheduled at [facility] for: Tuesday August 17, 2022 7:30 AM Arrival time and place: [place] Entrance A at: 5:30 AM  Nothing to eat after midnight prior to procedure, clear liquids until 5 AM day of procedure  Medication instructions: -Hold:  Eliquis-patient reports last dose PM 08/03/22-knows to hold until post procedure  Torsemide/KCl-AM of procedure  Ozempic-weekly on Sundays-pt reports did not take 08/15/22 and will hold until post procedure -Except hold medications usual morning medications can be taken with sips of water including aspirin 81 mg and Plavix 75 mg.  Confirmed patient has responsible adult to drive home post procedure and be with patient first 24 hours after arriving home.  Patient reports no new symptoms concerning for COVID-19 in the past 10 days.  Reviewed procedure instructions with patient.

## 2022-08-17 ENCOUNTER — Other Ambulatory Visit: Payer: Self-pay

## 2022-08-17 ENCOUNTER — Encounter (HOSPITAL_COMMUNITY)
Admission: RE | Disposition: A | Discharge status: Home / Self Care | Payer: Self-pay | Source: Home / Self Care | Attending: Cardiovascular Disease

## 2022-08-17 ENCOUNTER — Encounter (HOSPITAL_COMMUNITY): Payer: Self-pay | Admitting: Cardiovascular Disease

## 2022-08-17 ENCOUNTER — Ambulatory Visit (HOSPITAL_COMMUNITY)
Admission: RE | Admit: 2022-08-17 | Discharge: 2022-08-17 | Disposition: A | Discharge status: Home / Self Care | Payer: Medicare HMO | Attending: Cardiovascular Disease | Admitting: Cardiovascular Disease

## 2022-08-17 DIAGNOSIS — Z955 Presence of coronary angioplasty implant and graft: Secondary | ICD-10-CM | POA: Insufficient documentation

## 2022-08-17 DIAGNOSIS — I5032 Chronic diastolic (congestive) heart failure: Secondary | ICD-10-CM | POA: Insufficient documentation

## 2022-08-17 DIAGNOSIS — E669 Obesity, unspecified: Secondary | ICD-10-CM | POA: Diagnosis not present

## 2022-08-17 DIAGNOSIS — E785 Hyperlipidemia, unspecified: Secondary | ICD-10-CM | POA: Insufficient documentation

## 2022-08-17 DIAGNOSIS — I209 Angina pectoris, unspecified: Secondary | ICD-10-CM

## 2022-08-17 DIAGNOSIS — I480 Paroxysmal atrial fibrillation: Secondary | ICD-10-CM | POA: Diagnosis not present

## 2022-08-17 DIAGNOSIS — I252 Old myocardial infarction: Secondary | ICD-10-CM | POA: Diagnosis not present

## 2022-08-17 DIAGNOSIS — I110 Hypertensive heart disease with heart failure: Secondary | ICD-10-CM | POA: Insufficient documentation

## 2022-08-17 DIAGNOSIS — G4733 Obstructive sleep apnea (adult) (pediatric): Secondary | ICD-10-CM | POA: Diagnosis not present

## 2022-08-17 DIAGNOSIS — I25119 Atherosclerotic heart disease of native coronary artery with unspecified angina pectoris: Secondary | ICD-10-CM | POA: Diagnosis not present

## 2022-08-17 DIAGNOSIS — Z6839 Body mass index (BMI) 39.0-39.9, adult: Secondary | ICD-10-CM | POA: Insufficient documentation

## 2022-08-17 HISTORY — PX: LEFT HEART CATH AND CORONARY ANGIOGRAPHY: CATH118249

## 2022-08-17 SURGERY — LEFT HEART CATH AND CORONARY ANGIOGRAPHY
Anesthesia: LOCAL

## 2022-08-17 MED ORDER — FENTANYL CITRATE (PF) 100 MCG/2ML IJ SOLN
INTRAMUSCULAR | Status: DC | PRN
  Administered 2022-08-17: 25 ug via INTRAVENOUS

## 2022-08-17 MED ORDER — SODIUM CHLORIDE 0.9% FLUSH: 3.0000 mL | Freq: Two times a day (BID) | INTRAVENOUS | Status: DC

## 2022-08-17 MED ORDER — LIDOCAINE HCL (PF) 1 % IJ SOLN
INTRAMUSCULAR | Status: DC | PRN
  Administered 2022-08-17: 2 mL via INTRADERMAL

## 2022-08-17 MED ORDER — HEPARIN (PORCINE) IN NACL 1000-0.9 UT/500ML-% IV SOLN
INTRAVENOUS | Status: DC | PRN
  Administered 2022-08-17 (×2): 500 mL

## 2022-08-17 MED ORDER — VERAPAMIL HCL 2.5 MG/ML IV SOLN
INTRAVENOUS | Status: AC
  Filled 2022-08-17: qty 2

## 2022-08-17 MED ORDER — ASPIRIN 81 MG PO CHEW: 81.0000 mg | CHEWABLE_TABLET | ORAL | Status: DC

## 2022-08-17 MED ORDER — HEPARIN (PORCINE) IN NACL 1000-0.9 UT/500ML-% IV SOLN
INTRAVENOUS | Status: AC
  Filled 2022-08-17: qty 1000

## 2022-08-17 MED ORDER — IOHEXOL 350 MG/ML SOLN
INTRAVENOUS | Status: DC | PRN
  Administered 2022-08-17: 30 mL

## 2022-08-17 MED ORDER — FENTANYL CITRATE (PF) 100 MCG/2ML IJ SOLN
INTRAMUSCULAR | Status: AC
  Filled 2022-08-17: qty 2

## 2022-08-17 MED ORDER — LABETALOL HCL 5 MG/ML IV SOLN: 10.0000 mg | INTRAVENOUS | Status: DC | PRN

## 2022-08-17 MED ORDER — ACETAMINOPHEN 325 MG PO TABS: 650.0000 mg | ORAL_TABLET | ORAL | Status: DC | PRN

## 2022-08-17 MED ORDER — MIDAZOLAM HCL 2 MG/2ML IJ SOLN
INTRAMUSCULAR | Status: AC
  Filled 2022-08-17: qty 2

## 2022-08-17 MED ORDER — SODIUM CHLORIDE 0.9 % IV SOLN: INTRAVENOUS | Status: DC

## 2022-08-17 MED ORDER — LIDOCAINE HCL (PF) 1 % IJ SOLN
INTRAMUSCULAR | Status: AC
  Filled 2022-08-17: qty 30

## 2022-08-17 MED ORDER — SODIUM CHLORIDE 0.9 % WEIGHT BASED INFUSION: 1.0000 mL/kg/h | INTRAVENOUS | Status: DC

## 2022-08-17 MED ORDER — VERAPAMIL HCL 2.5 MG/ML IV SOLN
INTRAVENOUS | Status: DC | PRN
  Administered 2022-08-17: 10 mL via INTRA_ARTERIAL

## 2022-08-17 MED ORDER — HEPARIN SODIUM (PORCINE) 1000 UNIT/ML IJ SOLN
INTRAMUSCULAR | Status: DC | PRN
  Administered 2022-08-17: 6000 [IU] via INTRAVENOUS

## 2022-08-17 MED ORDER — SODIUM CHLORIDE 0.9% FLUSH: 3.0000 mL | INTRAVENOUS | Status: DC | PRN

## 2022-08-17 MED ORDER — HEPARIN SODIUM (PORCINE) 1000 UNIT/ML IJ SOLN
INTRAMUSCULAR | Status: AC
  Filled 2022-08-17: qty 10

## 2022-08-17 MED ORDER — ONDANSETRON HCL 4 MG/2ML IJ SOLN: 4.0000 mg | Freq: Four times a day (QID) | INTRAMUSCULAR | Status: DC | PRN

## 2022-08-17 MED ORDER — MIDAZOLAM HCL 2 MG/2ML IJ SOLN
INTRAMUSCULAR | Status: DC | PRN
  Administered 2022-08-17: 1 mg via INTRAVENOUS

## 2022-08-17 MED ORDER — SODIUM CHLORIDE 0.9 % IV SOLN: 250.0000 mL | INTRAVENOUS | Status: DC | PRN

## 2022-08-17 MED ORDER — HYDRALAZINE HCL 20 MG/ML IJ SOLN: 10.0000 mg | INTRAMUSCULAR | Status: DC | PRN

## 2022-08-17 SURGICAL SUPPLY — 10 items
BAND CMPR LRG ZPHR (HEMOSTASIS) ×1
BAND ZEPHYR COMPRESS 30 LONG (HEMOSTASIS) IMPLANT
CATH 5FR JL3.5 JR4 ANG PIG MP (CATHETERS) IMPLANT
GLIDESHEATH SLEND SS 6F .021 (SHEATH) IMPLANT
GUIDEWIRE INQWIRE 1.5J.035X260 (WIRE) IMPLANT
INQWIRE 1.5J .035X260CM (WIRE) ×1
KIT HEART LEFT (KITS) ×1 IMPLANT
PACK CARDIAC CATHETERIZATION (CUSTOM PROCEDURE TRAY) ×1 IMPLANT
TRANSDUCER W/STOPCOCK (MISCELLANEOUS) ×1 IMPLANT
TUBING CIL FLEX 10 FLL-RA (TUBING) ×1 IMPLANT

## 2022-08-17 NOTE — Interval H&P Note (Signed)
History and Physical Interval Note:  08/17/2022 8:10 AM  Devon Mathews.  has presented today for surgery, with the diagnosis of angina.  The various methods of treatment have been discussed with the patient and family. After consideration of risks, benefits and other options for treatment, the patient has consented to  Procedure(s): LEFT HEART CATH AND CORONARY ANGIOGRAPHY (N/A) as a surgical intervention.  The patient's history has been reviewed, patient examined, no change in status, stable for surgery.  I have reviewed the patient's chart and labs.  Questions were answered to the patient's satisfaction.     Sherren Mocha

## 2022-08-17 NOTE — Discharge Instructions (Signed)

## 2022-08-31 ENCOUNTER — Encounter: Payer: Self-pay | Admitting: *Deleted

## 2022-08-31 ENCOUNTER — Telehealth (HOSPITAL_BASED_OUTPATIENT_CLINIC_OR_DEPARTMENT_OTHER): Payer: Self-pay | Admitting: Cardiovascular Disease

## 2022-08-31 DIAGNOSIS — M1711 Unilateral primary osteoarthritis, right knee: Secondary | ICD-10-CM | POA: Diagnosis not present

## 2022-08-31 NOTE — Progress Notes (Signed)
[employer] PREP Weekly Session  Patient Details  Name: Devon Mathews. MRN: [id number] Date of Birth: 1951-04-25 Age: 71 y.o. PCP: Haywood Pao, MD  Vitals:   08/31/22 1602  Weight: 273 lb (123.8 kg)     [employer] Weekly seesion - 08/31/22 1600       [facility] "PREP" Location   [facility] "PREP" Location [facility]      Weekly Session   Topic Discussed Other   [employer] membership talk by Kathreen Cornfield and strong survey, Final FIT testing, Goals and Activity Plan.   Classes attended to date [place], [place] 08/31/2022, 4:08 PM

## 2022-08-31 NOTE — Telephone Encounter (Signed)
*  STAT* If patient is at the pharmacy, call can be transferred to refill team.   1. Which medications need to be refilled? (please list name of each medication and dose if known) amitriptyline (ELAVIL) 25 MG tablet   2. Which pharmacy/location (including street and city if local pharmacy) is medication to be sent to?  [id number] [address] [place]   3. Do they need a 30 day or 90 day supply? [address]

## 2022-08-31 NOTE — Telephone Encounter (Signed)
Left detailed message--ok per DPR--stating that we do not fill Devon Mathews. Informed him that he will need to request this from his PCP. Told to call back if any questions.

## 2022-09-02 ENCOUNTER — Encounter: Payer: Self-pay | Admitting: *Deleted

## 2022-09-02 NOTE — Progress Notes (Signed)
[employer] PREP Evaluation  Patient Details  Name: Devon Mathews. MRN: [id number] Date of Birth: 04-25-1951 Age: 71 y.o. PCP: Haywood Pao, MD  Vitals:   09/02/22 1549  BP: (!) 118/56  Pulse: 76  Resp: 20  Weight: 277 lb (125.6 kg)     [employer] Eval - 09/02/22 1500       [employer] "PREP" Location   [facility] "PREP" Location [facility]      Referral    Referring Provider [place]    Program Start Date 06/08/22      Measurement   Waist Circumference 51.75 inches    Hip Circumference 49.5 inches    Body fat 37.7 percent      Mobility and Daily Activities   I find it easy to walk up or down two or more flights of stairs. 2    I have no trouble taking out the trash. 4    I do housework such as vacuuming and dusting on my own without difficulty. 4    I can easily lift a gallon of milk (8lbs). 4    I can easily walk a mile. 1    I have no trouble reaching into high cupboards or reaching down to pick up something from the floor. 4    I do not have trouble doing out-door work such as Armed forces logistics/support/administrative officer, raking leaves, or gardening. 3      Mobility and Daily Activities   I feel younger than my age. 4    I feel independent. 4    I feel energetic. 2    I live an active life.  4    I feel strong. 3    I feel healthy. 3    I feel active as other people my age. 4      How fit and strong are you.   Fit and Strong Total Score 46            Past Medical History:  Diagnosis Date   Anxiety    Atrial fibrillation (HCC)    CAD S/P percutaneous coronary angioplasty 09/13/2018    99% p-mLAD (BTW d1&d2) -> SYNERGY DES 3.5 X 12 (3.75 mm). 20% LM.  Cx, small RI & co-dom RCA normal.  EF 65%.    Chronic diastolic heart failure ([place]) 11/30/2019   Congestive heart failure (CHF) ([place])    Diabetes mellitus without complication ([place])    Dizziness 2012   normal findings..   Essential hypertension 07/28/2016   GERD (gastroesophageal reflux disease)    Hyperlipidemia    Lower extremity  edema 07/28/2016   NSTEMI (non-ST elevated myocardial infarction) ([place]) 09/13/2018   The patient presented 09/13/2018 with a non-ST elevation MI, troponin peak was 0.15   Obesity    Obesity (BMI 30-39.9) 07/28/2016   OSA (obstructive sleep apnea) 07/28/2016   Restless leg syndrome    mild   SCCA (squamous cell carcinoma) of skin 08/24/2021   Left Forehead (in situ-inflitrating)   Shortness of breath 07/28/2016   Sleep apnea    Squamous cell carcinoma of skin 03/20/2015   right ant scalp(curetx3, 3f)   Squamous cell carcinoma of skin 01/01/2016   left lateral forehead (curetx3, 539f   Squamous cell carcinoma of skin 06/15/2019   left mid anterior scalp (tx afte bx)   Squamous cell carcinoma of skin 01/08/2021   well diff- left forehead   Squamous cell carcinoma of skin 01/08/2021   in situ- mid frontal scalp  TIA (transient ischemic attack)    Tubular adenoma    Past Surgical History:  Procedure Laterality Date   COLONOSCOPY     CORONARY BALLOON ANGIOPLASTY N/A 12/05/2018   Procedure: CORONARY BALLOON ANGIOPLASTY;  Surgeon: [name], MD;  Location: [place] [facility];  Service: Cardiovascular;  Laterality: N/A;   CORONARY STENT INTERVENTION N/A 09/13/2018   Procedure: CORONARY STENT INTERVENTION;  Surgeon: Belva Crome, MD;  Location: [place] [facility];  Service: Cardiovascular;; p-m LAD (btw D1&D2) - SYNERGY DES 3.5 x 12 (3.75 mm).   ESOPHAGOGASTRODUODENOSCOPY (EGD) WITH PROPOFOL N/A 03/01/2022   Procedure: ESOPHAGOGASTRODUODENOSCOPY (EGD) WITH PROPOFOL;  Surgeon: Gatha Mayer, MD;  Location: [facility];  Service: Gastroenterology;  Laterality: N/A;  Polypectomy   GASTRIC VARICES BANDING  03/01/2022   Procedure: GASTRIC  BANDING;  Surgeon: Gatha Mayer, MD;  Location: [facility];  Service: Gastroenterology;;   KNEE ARTHROSCOPY Right    LEFT HEART CATH AND CORONARY ANGIOGRAPHY N/A 09/13/2018   Procedure: LEFT HEART CATH AND CORONARY ANGIOGRAPHY;  Surgeon:  Belva Crome, MD;  Location: [place] [facility];  Service: Cardiovascular;;  99% p-mLAD (BTW d1&d2) -> SYNERGY DES 3.5 X 12 (3.75 mm). 20% LM.  Cx, small RI & co-dom RCA normal.  EF 65%.    LEFT HEART CATH AND CORONARY ANGIOGRAPHY  2003   30-40% proximal segmental stenosis in the LAD   LEFT HEART CATH AND CORONARY ANGIOGRAPHY  2006   EF >50% & no mitral regurgitation   LEFT HEART CATH AND CORONARY ANGIOGRAPHY N/A 12/05/2018   Procedure: LEFT HEART CATH AND CORONARY ANGIOGRAPHY;  Surgeon: [name], MD;  Location: [place] [facility];  Service: Cardiovascular;  Laterality: N/A;   LEFT HEART CATH AND CORONARY ANGIOGRAPHY N/A 08/17/2022   Procedure: LEFT HEART CATH AND CORONARY ANGIOGRAPHY;  Surgeon: Sherren Mocha, MD;  Location: [place] [facility];  Service: Cardiovascular;  Laterality: N/A;   NM MYOVIEW LTD  07/2016   Normal.  EF 55-65% 63%).  No ischemia or infarction.  LOW RISK   NM MYOVIEW LTD  11/2018   6: 34 min.  7.2 METS.  Noted chest pain and tightness.  Horizontal ST of T wave depression (2 mm) II, 3, V5 and V6 (HIGH RISK), large/severe defect basal-apical inferior, inferolateral wall.  Large-severe basal-apical anterior-anterolateral wall.  Medium/moderate defect in basal and mid inferoseptal wall.  Consistent with large prior infarct with peri-infarct ischemia.  HIGH RISK   PERICARDIOCENTESIS N/A 12/05/2018   Procedure: PERICARDIOCENTESIS;  Surgeon: [name], MD;  Location: [place] [facility];  Service: Cardiovascular;  Laterality: N/A;   SUBXYPHOID PERICARDIAL WINDOW N/A 12/25/2018   Procedure: SUBXYPHOID PERICARDIAL WINDOW;  Surgeon: [name], MD;  Location: [facility];  Service: Thoracic;  Laterality: N/A;   TEE WITHOUT CARDIOVERSION N/A 12/25/2018   Procedure: TRANSESOPHAGEAL ECHOCARDIOGRAM (TEE);  Surgeon: [name], MD;  Location: [facility] OR;  Service: Thoracic;  Laterality: N/A;   tendon detachment Right    arm   TRANSTHORACIC ECHOCARDIOGRAM  07/2016   EF  60-65% with mild LVH.  Mild AI.  Trivial MR and TR.   VASECTOMY     Social History   Tobacco Use  Smoking Status Never  Smokeless Tobacco Never    [place] 09/02/2022, 3:55 PM   Completed PREP. Attended 9 of 12 education classes and 8 of 10 exercise classes. Patient had excellent attendance but had some health issues which caused him to miss a few  classes at the end of the program. Mr. Desantis was an enthusiastic and engaged participant. Patient reports increase in stamina and the ability to walk farther without fatigue. He states he continues to work on following a healthier diet. Of note was a 4 lb weight gain in 2 days with  2-3+ pitting ankle edema bilaterally today. Secure email sent to Dr Oval [name].

## 2022-09-06 ENCOUNTER — Encounter (INDEPENDENT_AMBULATORY_CARE_PROVIDER_SITE_OTHER): Payer: Self-pay | Admitting: Family Medicine

## 2022-09-06 ENCOUNTER — Ambulatory Visit (INDEPENDENT_AMBULATORY_CARE_PROVIDER_SITE_OTHER): Payer: Medicare HMO | Admitting: Family Medicine

## 2022-09-06 VITALS — BP 124/66 | HR 78 | Temp 98.2°F | Ht 70.0 in | Wt 263.0 lb

## 2022-09-06 DIAGNOSIS — Z7985 Long-term (current) use of injectable non-insulin antidiabetic drugs: Secondary | ICD-10-CM | POA: Diagnosis not present

## 2022-09-06 DIAGNOSIS — E1169 Type 2 diabetes mellitus with other specified complication: Secondary | ICD-10-CM

## 2022-09-06 DIAGNOSIS — I10 Essential (primary) hypertension: Secondary | ICD-10-CM

## 2022-09-06 DIAGNOSIS — E669 Obesity, unspecified: Secondary | ICD-10-CM | POA: Diagnosis not present

## 2022-09-06 DIAGNOSIS — Z6837 Body mass index (BMI) 37.0-37.9, adult: Secondary | ICD-10-CM

## 2022-09-06 MED ORDER — OZEMPIC (0.25 OR 0.5 MG/DOSE) 2 MG/3ML ~~LOC~~ SOPN: 0.5000 mg | PEN_INJECTOR | SUBCUTANEOUS | 0 refills | Status: DC

## 2022-09-15 ENCOUNTER — Encounter: Payer: Self-pay | Admitting: Internal Medicine

## 2022-09-15 ENCOUNTER — Ambulatory Visit: Payer: Medicare HMO | Admitting: Internal Medicine

## 2022-09-15 VITALS — BP 136/80 | HR 64 | Ht 70.0 in | Wt 275.0 lb

## 2022-09-15 DIAGNOSIS — K760 Fatty (change of) liver, not elsewhere classified: Secondary | ICD-10-CM

## 2022-09-15 DIAGNOSIS — R109 Unspecified abdominal pain: Secondary | ICD-10-CM

## 2022-09-15 DIAGNOSIS — E669 Obesity, unspecified: Secondary | ICD-10-CM | POA: Diagnosis not present

## 2022-09-15 DIAGNOSIS — R152 Fecal urgency: Secondary | ICD-10-CM

## 2022-09-15 DIAGNOSIS — R932 Abnormal findings on diagnostic imaging of liver and biliary tract: Secondary | ICD-10-CM | POA: Diagnosis not present

## 2022-09-15 NOTE — Progress Notes (Unsigned)
Devon Mathews. 71 y.o. January 08, 1951 [id number]  Assessment & Plan:   Encounter Diagnoses  Name Primary?   Abnormal CT of liver Yes   Obesity (BMI 30-39.9)    Fecal urgency    Left flank discomfort    Clinically I do not think he has cirrhosis.  He is certainly at risk for it with history of NAFLD and obesity.  He is trying to lose weight and is congratulated.  I have referred him to the book called stay off my operating table which reviews different ways to change eating by reducing carbohydrates and also possibly time restricted eating.  He will continue with the weight loss clinic at [facility] as well.  I have asked him to monitor the fecal urgency and to try to see if certain foods trigger things.  It sounds to me like the left flank pain is musculoskeletal.   We plan to see each other again in about 6 months.  I do not think any additional imaging would add anything at this point.  I did explain he is at risk for cirrhosis and I gave him handouts regarding NAFLD and cirrhosis.  With respect to obesity and metabolic health he is making progress his insulin level was 55 in November 22 and down to 32 in December 2022.  It is still too high but that is the right direction.   Subjective:   Chief Complaint: Abnormal CT question cirrhosis  HPI 71 year old white man with a history of paroxysmal atrial fibrillation coronary artery disease with non-STEMI, on Eliquis and clopidogrel with a history of adenomatous colon polyps.  Last seen by me in May of this year when he had an upper endoscopy and he had multiple gastric polyps that EGD and I ligated 2 of those that had stigmata of bleeding.  He had had EGD in February with multiple polyps showing hyperplastic polyps and fundic gland polyps as well.  Colonoscopy in February with 2 diminutive polyps that were adenomatous and some erythema in the ileum that showed some chronic nonspecific ileitis.  He has not had issues with melena or GI bleeding  since but he had a CT of the chest to rule out a PE due to some shortness of breath and chest pain issues and the radiologist characterize the liver as "nodular hepatic contour suspicious for cirrhosis".  He has a known cyst of the liver as well seen again.  He had an ultrasound of the liver in 2018 that showed fatty liver changes.  He has been obese particularly with abdominal obesity though he has lost about 30 pounds in the last several months and he is actively trying to lose weight.  He is going to the [facility].  No known history of cirrhosis he is not a drinker.  Recent lab review shows normal platelets.  In July and October of this year.  Hemoglobin in the 12 range and stable.  In July his transaminases were 16 and 21 respectively (AST and ALT.  Albumin 3.4.  Total bilirubin 0.6 alk phos 62.   He does report having some increased gastrocolic reflex and urge to defecate after eating.  Stools are not particularly watery they are just urgent and may be a little soft at times.  He has not had incontinence.  In the past week or so he has had some cramps in the left flank that might bother him when he is rolling over in bed.  They are short-lived.  He  does not recall straining things. Allergies  Allergen Reactions   Penicillins Hives and Nausea And Vomiting    Did it involve swelling of the face/tongue/throat, SOB, or low BP? No Did it involve sudden or severe rash/hives, skin peeling, or any reaction on the inside of your mouth or nose? No Did you need to seek medical attention at a hospital or doctor's office? No When did it last happen?      30 + years If all above answers are "NO", may proceed with cephalosporin use.    Current Meds  Medication Sig   acetaminophen (TYLENOL) 500 MG tablet Take 500-1,000 mg by mouth every 6 (six) hours as needed for moderate pain.   amitriptyline (ELAVIL) 25 MG tablet Take 0.5 tablets (12.5 mg total) by mouth at bedtime as needed for sleep.    amLODipine (NORVASC) 2.5 MG tablet Take 1 tablet (2.5 mg total) by mouth daily.   atorvastatin (LIPITOR) 80 MG tablet TAKE 1 TABLET BY MOUTH EVERY DAY   Cholecalciferol (VITAMIN D) 125 MCG (5000 UT) CAPS Take 5,000 Units by mouth daily.   clopidogrel (PLAVIX) 75 MG tablet TAKE 1 TABLET BY MOUTH EVERY DAY   ELIQUIS 5 MG TABS tablet Take 1 tablet (5 mg total) by mouth 2 (two) times daily.   escitalopram (LEXAPRO) 10 MG tablet Take 10 mg by mouth daily.   metoprolol succinate (TOPROL-XL) 50 MG 24 hr tablet TAKE 1 TABLET BY MOUTH EVERY DAY WITH OR IMMEDIATELY FOLLOWING A MEAL (Patient taking differently: Take 50 mg by mouth daily.)   nitroGLYCERIN (NITROSTAT) 0.4 MG SL tablet Place 1 tablet (0.4 mg total) under the tongue every 5 (five) minutes as needed for chest pain.   pantoprazole (PROTONIX) 40 MG tablet TAKE ONE TABLET BY MOUTH ONE TIME DAILY. must make follow up appointment for further refills (Patient taking differently: Take 40 mg by mouth daily.)   potassium chloride SA (KLOR-CON M) 20 MEQ tablet Take 20 mEq by mouth daily as needed (when taking furosemide).   Semaglutide,0.25 or 0.5MG/DOS, (OZEMPIC, 0.25 OR 0.5 MG/DOSE,) 2 MG/3ML SOPN Inject 0.5 mg into the skin once a week.   torsemide (DEMADEX) 20 MG tablet Take 20 mg by mouth 2 (two) times daily as needed.   Current Facility-Administered Medications for the 09/15/22 encounter (Office Visit) with Gatha Mayer, MD  Medication   sodium chloride flush (NS) 0.9 % injection 3 mL   Past Medical History:  Diagnosis Date   Anxiety    Atrial fibrillation ([place])    CAD S/P percutaneous coronary angioplasty 09/13/2018    99% p-mLAD (BTW d1&d2) -> SYNERGY DES 3.5 X 12 (3.75 mm). 20% LM.  Cx, small RI & co-dom RCA normal.  EF 65%.    Chronic diastolic heart failure ([place]) 11/30/2019   Congestive heart failure (CHF) ([place])    Diabetes mellitus without complication ([place])    Dizziness 2012   normal findings..   Essential hypertension 07/28/2016    GERD (gastroesophageal reflux disease)    Hyperlipidemia    Lower extremity edema 07/28/2016   NSTEMI (non-ST elevated myocardial infarction) ([place]) 09/13/2018   The patient presented 09/13/2018 with a non-ST elevation MI, troponin peak was 0.15   Obesity    Obesity (BMI 30-39.9) 07/28/2016   OSA (obstructive sleep apnea) 07/28/2016   Restless leg syndrome    mild   SCCA (squamous cell carcinoma) of skin 08/24/2021   Left Forehead (in situ-inflitrating)   Shortness of breath 07/28/2016   Sleep apnea  Squamous cell carcinoma of skin 03/20/2015   right ant scalp(curetx3, 88f)   Squamous cell carcinoma of skin 01/01/2016   left lateral forehead (curetx3, 572f   Squamous cell carcinoma of skin 06/15/2019   left mid anterior scalp (tx afte bx)   Squamous cell carcinoma of skin 01/08/2021   well diff- left forehead   Squamous cell carcinoma of skin 01/08/2021   in situ- mid frontal scalp   TIA (transient ischemic attack)    Tubular adenoma    Past Surgical History:  Procedure Laterality Date   COLONOSCOPY     CORONARY BALLOON ANGIOPLASTY N/A 12/05/2018   Procedure: CORONARY BALLOON ANGIOPLASTY;  Surgeon: HaLeonie ManMD;  [facility];  Service: Cardiovascular;  Laterality: N/A;   CORONARY STENT INTERVENTION N/A 09/13/2018   Procedure: CORONARY STENT INTERVENTION;  Surgeon: SmBelva CromeMD;  [facility];  Service: Cardiovascular;; p-m LAD (btw D1&D2) - SYNERGY DES 3.5 x 12 (3.75 mm).   ESOPHAGOGASTRODUODENOSCOPY (EGD) WITH PROPOFOL N/A 03/01/2022   Procedure: ESOPHAGOGASTRODUODENOSCOPY (EGD) WITH PROPOFOL;  Surgeon: GeGatha MayerMD;  [facility];  Service: Gastroenterology;  Laterality: N/A;  Polypectomy   GASTRIC VARICES BANDING  03/01/2022   Procedure: GASTRIC  BANDING;  Surgeon: GeGatha MayerMD;  [facility];  Service: Gastroenterology;;   KNEE ARTHROSCOPY Right    LEFT HEART CATH AND CORONARY ANGIOGRAPHY N/A  09/13/2018   Procedure: LEFT HEART CATH AND CORONARY ANGIOGRAPHY;  Surgeon: SmBelva CromeMD;  [facility];  Service: Cardiovascular;;  99% p-mLAD (BTW d1&d2) -> SYNERGY DES 3.5 X 12 (3.75 mm). 20% LM.  Cx, small RI & co-dom RCA normal.  EF 65%.    LEFT HEART CATH AND CORONARY ANGIOGRAPHY  2003   30-40% proximal segmental stenosis in the LAD   LEFT HEART CATH AND CORONARY ANGIOGRAPHY  2006   EF >50% & no mitral regurgitation   LEFT HEART CATH AND CORONARY ANGIOGRAPHY N/A 12/05/2018   Procedure: LEFT HEART CATH AND CORONARY ANGIOGRAPHY;  Surgeon: HaLeonie ManMD;  [facility];  Service: Cardiovascular;  Laterality: N/A;   LEFT HEART CATH AND CORONARY ANGIOGRAPHY N/A 08/17/2022   Procedure: LEFT HEART CATH AND CORONARY ANGIOGRAPHY;  Surgeon: CoSherren MochaMD;  [facility];  Service: Cardiovascular;  Laterality: N/A;   NM MYOVIEW LTD  07/2016   Normal.  EF 55-65% 63%).  No ischemia or infarction.  LOW RISK   NM MYOVIEW LTD  11/2018   6: 34 min.  7.2 METS.  Noted chest pain and tightness.  Horizontal ST of T wave depression (2 mm) II, 3, V5 and V6 (HIGH RISK), large/severe defect basal-apical inferior, inferolateral wall.  Large-severe basal-apical anterior-anterolateral wall.  Medium/moderate defect in basal and mid inferoseptal wall.  Consistent with large prior infarct with peri-infarct ischemia.  HIGH RISK   PERICARDIOCENTESIS N/A 12/05/2018   Procedure: PERICARDIOCENTESIS;  Surgeon: HaLeonie ManMD;  [facility];  Service: Cardiovascular;  Laterality: N/A;   SUBXYPHOID PERICARDIAL WINDOW N/A 12/25/2018   Procedure: SUBXYPHOID PERICARDIAL WINDOW;  Surgeon: BaGaye PollackMD;  Location: [facility];  Service: Thoracic;  Laterality: N/A;   TEE WITHOUT CARDIOVERSION N/A 12/25/2018   Procedure: TRANSESOPHAGEAL ECHOCARDIOGRAM (TEE);  Surgeon: BaGaye PollackMD;  [facility];  Service: Thoracic;  Laterality: N/A;    tendon detachment Right    arm   TRANSTHORACIC ECHOCARDIOGRAM  07/2016  EF 60-65% with mild LVH.  Mild AI.  Trivial MR and TR.   VASECTOMY     Social History   Social History Narrative   Patient is married with 2 sons and retired   Never smoker no alcohol or tobacco now and no caffeine now   family history includes COPD in his father; Diabetes in his brother, mother, and sister; Heart attack in his paternal grandmother and sister; Heart disease in his father and mother; Heart failure in his father, mother, and sister; Hypertension in his mother; Other in his father and maternal grandfather; Pancreatic cancer in his paternal aunt and paternal uncle; Stroke in his father.   Review of Systems As above  Objective:   Physical Exam BP 136/80   Pulse 64   Ht _0  (1.778 m)   Wt 275 lb (124.7 kg)   BMI 39.46 kg/m  Obese pleasant white man no acute distress. The abdomen is protuberant and obese and there is a small soft reducible umbilical hernia present.  It is nontender as is the flank.  There are some prominent blood vessels there.  I do not see any stigmata of chronic liver disease on this man i.e. no spider angiomata, no abdominal wall veins.

## 2022-09-15 NOTE — Patient Instructions (Addendum)
Call us in 4 months (late March or early April 2024) to get a May 2024 appointment.  Keep changing how you eat for better health and weight loss.   Check out the book Stay off My Operating Table by Dr. Tracie Harrier - will tell you how to eat differently to get healthier  Read attachments - I suspect you have fatty liver disease but not cirrhosis.  Monitor your diet to see what is causing the urgent defecation and eliminate those foods - if that fails call/come back.  I think the left-side pain is muscular - hopefully will go away - may need to see PCP if not  I appreciate the opportunity to care for you. Gatha Mayer, MD, [facility]

## 2022-09-16 ENCOUNTER — Encounter: Payer: Self-pay | Admitting: Internal Medicine

## 2022-09-16 NOTE — Progress Notes (Signed)
Chief Complaint:   OBESITY Devon Mathews is here to discuss his progress with his obesity treatment plan along with follow-up of his obesity related diagnoses. Devon Mathews is on the Category 3 Plan and keeping a food journal and adhering to recommended goals of 1600 calories and 110-120 protein and states he is following his eating plan approximately 60% of the time. Devon Mathews states he is going to the gym 12 minutes 2 times per week.  Today's visit was #: 21 Starting weight: 284 lbs Starting date: 04/28/2021 Today's weight: 263 lbs Today's date: 09/06/2022 Total lbs lost to date: 21 lbs Total lbs lost since last in-office visit: 2  Interim History: Devon Mathews referred to [facility] to get more activity. He does eat out frequently someday's, breakfast, lunch and supper. Wife has not been cooking as much and so eating out has increased. Sometimes eats out at [employer]. Breakfast is mostly on plan.  Subjective:   1. Type 2 diabetes mellitus with other specified complication, unspecified whether long term insulin use (HCC) Devon Mathews is currently on Ozempic 0.25 mg SubQ. His last A1c 5.8. Just started Ozempic.  2. Essential hypertension Devon Mathews's blood pressure controlled today. Denies chest pain, chest pressure and headache.  Assessment/Plan:   1. Type 2 diabetes mellitus with other specified complication, unspecified whether long term insulin use (HCC) Increase/refill Ozempic 0.5 mg SubQ once weekly for 1 month with 0 refills.  -Increase/refill  Semaglutide,0.25 or 0.'5MG'$ /DOS, (OZEMPIC, 0.25 OR 0.5 MG/DOSE,) 2 MG/3ML SOPN; Inject 0.5 mg into the skin once a week.  Dispense: 3 mL; Refill: 0  2. Essential hypertension Continue taking Amlodipine, Losartan, Metoprolol.  3. Obesity,current BMI is 37.9 Devon Mathews is currently in the action stage of change. As such, his goal is to continue with weight loss efforts. He has agreed to the Category 4 Plan.   Exercise goals: All adults should avoid inactivity. Some physical activity is better  than none, and adults who participate in any amount of physical activity gain some health benefits.  Behavioral modification strategies: increasing lean protein intake, meal planning and cooking strategies, keeping healthy foods in the home, and planning for success.  Devon Mathews has agreed to follow-up with our clinic in 3 weeks. He was informed of the importance of frequent follow-up visits to maximize his success with intensive lifestyle modifications for his multiple health conditions.   Objective:   Blood pressure 124/66, pulse 78, temperature 98.2 F (36.8 C), height '5\' 10"'$  (1.778 m), weight 263 lb (119.3 kg), SpO2 96 %. Body mass index is 37.74 kg/m.  General: Cooperative, alert, well developed, in no acute distress. HEENT: Conjunctivae and lids unremarkable. Cardiovascular: Regular rhythm.  Lungs: Normal work of breathing. Neurologic: No focal deficits.   Lab Results  Component Value Date   CREATININE 1.12 08/10/2022   BUN 18 08/10/2022   NA 143 08/10/2022   K 4.8 08/10/2022   CL 107 (H) 08/10/2022   CO2 21 08/10/2022   Lab Results  Component Value Date   ALT 16 01/07/2022   AST 15 01/07/2022   ALKPHOS 72 01/07/2022   BILITOT 0.3 01/07/2022   Lab Results  Component Value Date   HGBA1C 5.6 09/21/2021   HGBA1C 6.2 04/17/2021   HGBA1C 6.9 (H) 03/09/2021   HGBA1C 5.5 12/21/2018   HGBA1C 5.2 09/14/2018   Lab Results  Component Value Date   INSULIN 32.1 (H) 09/21/2021   INSULIN 55.5 (H) 04/28/2021   Lab Results  Component Value Date   TSH 5.530 (H) 09/21/2021  Lab Results  Component Value Date   CHOL 101 05/07/2022   HDL 47 05/07/2022   LDLCALC 40 05/07/2022   TRIG 68 05/07/2022   CHOLHDL 2.5 01/07/2022   Lab Results  Component Value Date   VD25OH 77.0 09/21/2021   VD25OH 73.3 04/28/2021   Lab Results  Component Value Date   WBC 3.9 08/10/2022   HGB 12.5 (L) 08/10/2022   HCT 38.4 08/10/2022   MCV 96 08/10/2022   PLT 176 08/10/2022   No results found  for: "IRON", "TIBC", "FERRITIN"  Attestation Statements:   Reviewed by clinician on day of visit: allergies, medications, problem list, medical history, surgical history, family history, social history, and previous encounter notes.  I, Elnora Morrison, RMA am acting as transcriptionist for Coralie Common, MD.  I have reviewed the above documentation for accuracy and completeness, and I agree with the above. - Coralie Common, MD

## 2022-09-21 DIAGNOSIS — M25561 Pain in right knee: Secondary | ICD-10-CM | POA: Diagnosis not present

## 2022-09-21 DIAGNOSIS — M75121 Complete rotator cuff tear or rupture of right shoulder, not specified as traumatic: Secondary | ICD-10-CM | POA: Diagnosis not present

## 2022-09-30 ENCOUNTER — Other Ambulatory Visit (HOSPITAL_BASED_OUTPATIENT_CLINIC_OR_DEPARTMENT_OTHER): Payer: Self-pay | Admitting: Cardiovascular Disease

## 2022-09-30 NOTE — Telephone Encounter (Signed)
Rx request sent to pharmacy.  

## 2022-10-05 ENCOUNTER — Encounter (INDEPENDENT_AMBULATORY_CARE_PROVIDER_SITE_OTHER): Payer: Self-pay | Admitting: Family Medicine

## 2022-10-05 ENCOUNTER — Ambulatory Visit (HOSPITAL_BASED_OUTPATIENT_CLINIC_OR_DEPARTMENT_OTHER): Payer: Medicare HMO | Admitting: Family

## 2022-10-05 ENCOUNTER — Ambulatory Visit (INDEPENDENT_AMBULATORY_CARE_PROVIDER_SITE_OTHER): Payer: Medicare HMO | Admitting: Family Medicine

## 2022-10-05 VITALS — BP 113/67 | HR 66 | Temp 98.5°F | Ht 70.0 in | Wt 263.0 lb

## 2022-10-05 DIAGNOSIS — Z7985 Long-term (current) use of injectable non-insulin antidiabetic drugs: Secondary | ICD-10-CM | POA: Diagnosis not present

## 2022-10-05 DIAGNOSIS — E785 Hyperlipidemia, unspecified: Secondary | ICD-10-CM | POA: Diagnosis not present

## 2022-10-05 DIAGNOSIS — E1169 Type 2 diabetes mellitus with other specified complication: Secondary | ICD-10-CM

## 2022-10-05 DIAGNOSIS — E669 Obesity, unspecified: Secondary | ICD-10-CM | POA: Diagnosis not present

## 2022-10-05 DIAGNOSIS — E1165 Type 2 diabetes mellitus with hyperglycemia: Secondary | ICD-10-CM

## 2022-10-05 DIAGNOSIS — E1159 Type 2 diabetes mellitus with other circulatory complications: Secondary | ICD-10-CM

## 2022-10-05 DIAGNOSIS — Z6837 Body mass index (BMI) 37.0-37.9, adult: Secondary | ICD-10-CM

## 2022-10-05 DIAGNOSIS — E559 Vitamin D deficiency, unspecified: Secondary | ICD-10-CM | POA: Diagnosis not present

## 2022-10-05 DIAGNOSIS — I152 Hypertension secondary to endocrine disorders: Secondary | ICD-10-CM | POA: Diagnosis not present

## 2022-10-06 LAB — COMPREHENSIVE METABOLIC PANEL
ALT: 27 IU/L (ref 0–44)
AST: 23 IU/L (ref 0–40)
Albumin/Globulin Ratio: 1.6 (ref 1.2–2.2)
Albumin: 4 g/dL (ref 3.9–4.9)
Alkaline Phosphatase: 79 IU/L (ref 44–121)
BUN/Creatinine Ratio: 11 (ref 10–24)
BUN: 13 mg/dL (ref 8–27)
Bilirubin Total: 0.6 mg/dL (ref 0.0–1.2)
CO2: 22 mmol/L (ref 20–29)
Calcium: 9.1 mg/dL (ref 8.6–10.2)
Chloride: 102 mmol/L (ref 96–106)
Creatinine, Ser: 1.22 mg/dL (ref 0.76–1.27)
Globulin, Total: 2.5 g/dL (ref 1.5–4.5)
Glucose: 104 mg/dL — ABNORMAL HIGH (ref 70–99)
Potassium: 4.5 mmol/L (ref 3.5–5.2)
Sodium: 138 mmol/L (ref 134–144)
Total Protein: 6.5 g/dL (ref 6.0–8.5)
eGFR: 64 mL/min/{1.73_m2} (ref >59)

## 2022-10-06 LAB — VITAMIN D 25 HYDROXY (VIT D DEFICIENCY, FRACTURES): Vit D, 25-Hydroxy: 58.6 ng/mL (ref 30.0–100.0)

## 2022-10-06 LAB — LIPID PANEL WITH LDL/HDL RATIO
Cholesterol, Total: 91 mg/dL — ABNORMAL LOW (ref 100–199)
HDL: 32 mg/dL — ABNORMAL LOW (ref >39)
LDL Chol Calc (NIH): 44 mg/dL (ref 0–99)
LDL/HDL Ratio: 1.4 ratio (ref 0.0–3.6)
Triglycerides: 69 mg/dL (ref 0–149)
VLDL Cholesterol Cal: 15 mg/dL (ref 5–40)

## 2022-10-06 LAB — HEMOGLOBIN A1C
Est. average glucose Bld gHb Est-mCnc: 123 mg/dL
Hgb A1c MFr Bld: 5.9 % — ABNORMAL HIGH (ref 4.8–5.6)

## 2022-10-06 LAB — INSULIN, RANDOM: INSULIN: 28.3 u[IU]/mL — ABNORMAL HIGH (ref 2.6–24.9)

## 2022-10-13 ENCOUNTER — Telehealth (HOSPITAL_BASED_OUTPATIENT_CLINIC_OR_DEPARTMENT_OTHER): Payer: Self-pay | Admitting: Family

## 2022-10-13 NOTE — Telephone Encounter (Signed)
Spoke with patient regarding 10/26/22 9:15 am appointment ---rescheduled to Tuesday 11/23/22 at 9:15 am.  Will mail information to patient and it is available on My Chart.  Patient voiced his understanding.

## 2022-10-19 DIAGNOSIS — L570 Actinic keratosis: Secondary | ICD-10-CM | POA: Diagnosis not present

## 2022-10-19 DIAGNOSIS — D225 Melanocytic nevi of trunk: Secondary | ICD-10-CM | POA: Diagnosis not present

## 2022-10-19 DIAGNOSIS — D2262 Melanocytic nevi of left upper limb, including shoulder: Secondary | ICD-10-CM | POA: Diagnosis not present

## 2022-10-19 DIAGNOSIS — D2261 Melanocytic nevi of right upper limb, including shoulder: Secondary | ICD-10-CM | POA: Diagnosis not present

## 2022-10-19 DIAGNOSIS — L821 Other seborrheic keratosis: Secondary | ICD-10-CM | POA: Diagnosis not present

## 2022-10-19 DIAGNOSIS — D2271 Melanocytic nevi of right lower limb, including hip: Secondary | ICD-10-CM | POA: Diagnosis not present

## 2022-10-19 DIAGNOSIS — Z85828 Personal history of other malignant neoplasm of skin: Secondary | ICD-10-CM | POA: Diagnosis not present

## 2022-10-20 NOTE — Progress Notes (Signed)
Chief Complaint:   OBESITY Rhonda is here to discuss his progress with his obesity treatment plan along with follow-up of his obesity related diagnoses. Okechukwu is on the Category 4 Plan and states he is following his eating plan approximately 75-80% of the time. Jihan states he is exercise 0 minutes 0 times per week.  Today's visit was #: 22 Starting weight: 284 lbs Starting date: 04/28/2021 Today's weight: 263 lbs Today's date: 10/05/2022 Total lbs lost to date: 21 lbs Total lbs lost since last in-office visit: 0  Interim History: Kristofer had a good local Thanksgiving--kids came to his house. He is ready. Stay off my operating table. He has cut off drinking all soda without hunger. Eating out frequently so trying to stay mindful with focus on protein and vegetables. Eating breakfast and lunch at home.  Subjective:   1. Type 2 diabetes mellitus with hyperglycemia, without long-term current use of insulin (HCC) Alwyn is on Ozempic 0.5 mg. Denies GI side effects. Last A1c was 5.8 in July.  2. Hypertension associated with type 2 diabetes mellitus ([place]) Panfilo is on Norvasc, Cozaar, and Demadex. Blood pressure within normal limits today.  3. Hyperlipidemia associated with type 2 diabetes mellitus ([place]) History of Angina. Sees cardiology.  4. Vitamin D deficiency Osiris is taking 5k IU daily of Vit D. He notes fatigue.  Assessment/Plan:   1. Type 2 diabetes mellitus with hyperglycemia, without long-term current use of insulin (HCC) We will obtain labs today.  - Hemoglobin A1c - Insulin, random  2. Hypertension associated with type 2 diabetes mellitus (HCC) We will obtain labs today.  - Comprehensive metabolic panel  3. Hyperlipidemia associated with type 2 diabetes mellitus ([place]) We will obtain labs today.  - Lipid Panel With LDL/HDL Ratio  4. Vitamin D deficiency We will obtain labs today.  - VITAMIN D 25 Hydroxy (Vit-D Deficiency, Fractures)  5. Obesity, current BMI is 37.8 Rein  is currently in the action stage of change. As such, his goal is to continue with weight loss efforts. He has agreed to the Category 4 Plan.   Exercise goals: Older adults should follow the adult guidelines. When older adults cannot meet the adult guidelines, they should be as physically active as their abilities and conditions will allow.   Talk plan at next appointment.  Behavioral modification strategies: increasing lean protein intake, meal planning and cooking strategies, keeping healthy foods in the home, and planning for success.  Byran has agreed to follow-up with our clinic in 4 weeks. He was informed of the importance of frequent follow-up visits to maximize his success with intensive lifestyle modifications for his multiple health conditions.   Mithran was informed we would discuss his lab results at his next visit unless there is a critical issue that needs to be addressed sooner. Junious agreed to keep his next visit at the agreed upon time to discuss these results.  Objective:   Blood pressure 113/67, pulse 66, temperature 98.5 F (36.9 C), height '5\' 10"'$  (1.778 m), weight 263 lb (119.3 kg), SpO2 96 %. Body mass index is 37.74 kg/m.  General: Cooperative, alert, well developed, in no acute distress. HEENT: Conjunctivae and lids unremarkable. Cardiovascular: Regular rhythm.  Lungs: Normal work of breathing. Neurologic: No focal deficits.   Lab Results  Component Value Date   CREATININE 1.22 10/05/2022   BUN 13 10/05/2022   NA 138 10/05/2022   K 4.5 10/05/2022   CL 102 10/05/2022   CO2 22 10/05/2022  Lab Results  Component Value Date   ALT 27 10/05/2022   AST 23 10/05/2022   ALKPHOS 79 10/05/2022   BILITOT 0.6 10/05/2022   Lab Results  Component Value Date   HGBA1C 5.9 (H) 10/05/2022   HGBA1C 5.6 09/21/2021   HGBA1C 6.2 04/17/2021   HGBA1C 6.9 (H) 03/09/2021   HGBA1C 5.5 12/21/2018   Lab Results  Component Value Date   INSULIN 28.3 (H) 10/05/2022   INSULIN 32.1  (H) 09/21/2021   INSULIN 55.5 (H) 04/28/2021   Lab Results  Component Value Date   TSH 5.530 (H) 09/21/2021   Lab Results  Component Value Date   CHOL 91 (L) 10/05/2022   HDL 32 (L) 10/05/2022   LDLCALC 44 10/05/2022   TRIG 69 10/05/2022   CHOLHDL 2.5 01/07/2022   Lab Results  Component Value Date   VD25OH 58.6 10/05/2022   VD25OH 77.0 09/21/2021   VD25OH 73.3 04/28/2021   Lab Results  Component Value Date   WBC 3.9 08/10/2022   HGB 12.5 (L) 08/10/2022   HCT 38.4 08/10/2022   MCV 96 08/10/2022   PLT 176 08/10/2022   No results found for: "IRON", "TIBC", "FERRITIN"  Obesity Behavioral Intervention:   Approximately 15 minutes were spent on the discussion below.  ASK: We discussed the diagnosis of obesity with Carloyn Manner today and Jp agreed to give [place] permission to discuss obesity behavioral modification therapy today.  ASSESS: Wright has the diagnosis of obesity and his BMI today is 37.8. Holley is in the action stage of change.   ADVISE: Taygen was educated on the multiple health risks of obesity as well as the benefit of weight loss to improve his health. He was advised of the need for long term treatment and the importance of lifestyle modifications to improve his current health and to decrease his risk of future health problems.  AGREE: Multiple dietary modification options and treatment options were discussed and Omer agreed to follow the recommendations documented in the above note.  ARRANGE: Niclas was educated on the importance of frequent visits to treat obesity as outlined per CMS and USPSTF guidelines and agreed to schedule his next follow up appointment today.  Attestation Statements:   Reviewed by clinician on day of visit: allergies, medications, problem list, medical history, surgical history, family history, social history, and previous encounter notes.  I, Elnora Morrison, RMA am acting as transcriptionist for Coralie Common, MD.  I have reviewed the above  documentation for accuracy and completeness, and I agree with the above. - Coralie Common, MD

## 2022-10-26 ENCOUNTER — Ambulatory Visit (HOSPITAL_BASED_OUTPATIENT_CLINIC_OR_DEPARTMENT_OTHER): Payer: Medicare HMO | Admitting: Family

## 2022-11-08 ENCOUNTER — Ambulatory Visit (INDEPENDENT_AMBULATORY_CARE_PROVIDER_SITE_OTHER): Payer: Medicare HMO | Admitting: Family Medicine

## 2022-11-19 DIAGNOSIS — G4733 Obstructive sleep apnea (adult) (pediatric): Secondary | ICD-10-CM | POA: Diagnosis not present

## 2022-11-19 DIAGNOSIS — I509 Heart failure, unspecified: Secondary | ICD-10-CM | POA: Diagnosis not present

## 2022-11-19 DIAGNOSIS — D126 Benign neoplasm of colon, unspecified: Secondary | ICD-10-CM | POA: Diagnosis not present

## 2022-11-19 DIAGNOSIS — Z7901 Long term (current) use of anticoagulants: Secondary | ICD-10-CM | POA: Diagnosis not present

## 2022-11-19 DIAGNOSIS — I2510 Atherosclerotic heart disease of native coronary artery without angina pectoris: Secondary | ICD-10-CM | POA: Diagnosis not present

## 2022-11-19 DIAGNOSIS — R7301 Impaired fasting glucose: Secondary | ICD-10-CM | POA: Diagnosis not present

## 2022-11-19 DIAGNOSIS — I119 Hypertensive heart disease without heart failure: Secondary | ICD-10-CM | POA: Diagnosis not present

## 2022-11-19 DIAGNOSIS — R413 Other amnesia: Secondary | ICD-10-CM | POA: Diagnosis not present

## 2022-11-19 DIAGNOSIS — I110 Hypertensive heart disease with heart failure: Secondary | ICD-10-CM | POA: Diagnosis not present

## 2022-11-19 DIAGNOSIS — I480 Paroxysmal atrial fibrillation: Secondary | ICD-10-CM | POA: Diagnosis not present

## 2022-11-19 DIAGNOSIS — M1990 Unspecified osteoarthritis, unspecified site: Secondary | ICD-10-CM | POA: Diagnosis not present

## 2022-11-19 DIAGNOSIS — M109 Gout, unspecified: Secondary | ICD-10-CM | POA: Diagnosis not present

## 2022-11-21 NOTE — Progress Notes (Signed)
Cardiology Office Note:    Date:  11/23/2022   ID:  Devon Mathews., DOB [birth year], MRN [id number]  PCP:  Haywood Pao, MD   [facility] Providers Cardiologist:  None     Referring MD: Haywood Pao, MD   Chief Complaint  Patient presents with   Follow-up    Seen for Dr. Oval [name]     History of Present Illness:    Devon Mathews. is a 72 y.o. male with a hx of CAD (DES to mid LAD 2019; 95% jailed diagonal where previously placed LAD stent was patent), hypertension, PAF (anticoagulated on Eliquis), chronic diastolic heart failure, OSA (on CPAP), type 2 diabetes, hyperlipidemia, previous TIA.  He had a heart cath in 2003 that showed 40% LAD lesion. Several myoviews followed and were all low risk. He presented to [facility] on 09/13/18 with chest pain, ruled in for a NSTEMI > diagnostic cath and LAD stenting. On 12/05/18 he was admitted for diagnostic cath > 95% jailed Dx1 where previously placed LAD stent was patent, he underwent PCI, vessel was perforated and he developed a pericardial effusion, ultimately stabilized and d/c'd on colchicine.   Was last evaluated in our office on 08/10/2022 by Dr. Oval [name], at that time he was experiencing chest pain.  He associated with high stress and caring for his wife who is recovering from surgery and indigestion however he was also awoken in the middle of the night with chest pain and presented to the ED.  His troponin was negative.  He was continuing to feel short of breath.  The decision was ultimately made made to proceed with a left heart cath for further evaluation of his chest pain.  Cath revealed Ost 1st Diag lesion is 30% stenosed, patent LAD, including the stented segment in the proximal vessel, with continued patency of the diagonal branch that was intervened upon in 2020, recommendations for continued medical treatment.   He presents today for follow up of his CAD and HTN. He has been feeling well overall from a cardiac  perspective. He has gained much of the 50 lbs back that he lost. He has started a new part time job, were he is predominantly driving and admittedly not making the best dietary choices. He also has not been exercising. We discussed restarting his Ozempic, but he did not feel it was beneficial and was not interested in restarting. He has some pedal edema, occasionally has taken his Demadex with good results. Discussed taking this PRN and parameters for when he should. He denies chest pain, palpitations, dyspnea, pnd, orthopnea, n, v, dizziness, syncope, weight gain, or early satiety. Denies hematuria, hematochezia, or hemoptysis.    Past Medical History:  Diagnosis Date   Anxiety    Atrial fibrillation (HCC)    CAD S/P percutaneous coronary angioplasty 09/13/2018    99% p-mLAD (BTW d1&d2) -> SYNERGY DES 3.5 X 12 (3.75 mm). 20% LM.  Cx, small RI & co-dom RCA normal.  EF 65%.    Chronic diastolic heart failure ([place]) 11/30/2019   Congestive heart failure (CHF) ([place])    Diabetes mellitus without complication ([place])    Dizziness 2012   normal findings..   Essential hypertension 07/28/2016   GERD (gastroesophageal reflux disease)    Hyperlipidemia    Lower extremity edema 07/28/2016   NSTEMI (non-ST elevated myocardial infarction) ([place]) 09/13/2018   The patient presented 09/13/2018 with a non-ST elevation MI, troponin peak was 0.15   Obesity    Obesity (  BMI 30-39.9) 07/28/2016   OSA (obstructive sleep apnea) 07/28/2016   Restless leg syndrome    mild   SCCA (squamous cell carcinoma) of skin 08/24/2021   Left Forehead (in situ-inflitrating)   Shortness of breath 07/28/2016   Sleep apnea    Squamous cell carcinoma of skin 03/20/2015   right ant scalp(curetx3, 29f)   Squamous cell carcinoma of skin 01/01/2016   left lateral forehead (curetx3, 548f   Squamous cell carcinoma of skin 06/15/2019   left mid anterior scalp (tx afte bx)   Squamous cell carcinoma of skin 01/08/2021   well diff- left  forehead   Squamous cell carcinoma of skin 01/08/2021   in situ- mid frontal scalp   TIA (transient ischemic attack)    Tubular adenoma     Past Surgical History:  Procedure Laterality Date   COLONOSCOPY     CORONARY BALLOON ANGIOPLASTY N/A 12/05/2018   Procedure: CORONARY BALLOON ANGIOPLASTY;  Surgeon: HaLeonie ManMD;  [facility];  Service: Cardiovascular;  Laterality: N/A;   CORONARY STENT INTERVENTION N/A 09/13/2018   Procedure: CORONARY STENT INTERVENTION;  Surgeon: SmBelva CromeMD;  [facility];  Service: Cardiovascular;; p-m LAD (btw D1&D2) - SYNERGY DES 3.5 x 12 (3.75 mm).   ESOPHAGOGASTRODUODENOSCOPY (EGD) WITH PROPOFOL N/A 03/01/2022   Procedure: ESOPHAGOGASTRODUODENOSCOPY (EGD) WITH PROPOFOL;  Surgeon: GeGatha MayerMD;  [facility];  Service: Gastroenterology;  Laterality: N/A;  Polypectomy   GASTRIC VARICES BANDING  03/01/2022   Procedure: GASTRIC  BANDING;  Surgeon: GeGatha MayerMD;  [facility];  Service: Gastroenterology;;   KNEE ARTHROSCOPY Right    LEFT HEART CATH AND CORONARY ANGIOGRAPHY N/A 09/13/2018   Procedure: LEFT HEART CATH AND CORONARY ANGIOGRAPHY;  Surgeon: SmBelva CromeMD;  [facility];  Service: Cardiovascular;;  99% p-mLAD (BTW d1&d2) -> SYNERGY DES 3.5 X 12 (3.75 mm). 20% LM.  Cx, small RI & co-dom RCA normal.  EF 65%.    LEFT HEART CATH AND CORONARY ANGIOGRAPHY  2003   30-40% proximal segmental stenosis in the LAD   LEFT HEART CATH AND CORONARY ANGIOGRAPHY  2006   EF >50% & no mitral regurgitation   LEFT HEART CATH AND CORONARY ANGIOGRAPHY N/A 12/05/2018   Procedure: LEFT HEART CATH AND CORONARY ANGIOGRAPHY;  Surgeon: HaLeonie ManMD;  [facility];  Service: Cardiovascular;  Laterality: N/A;   LEFT HEART CATH AND CORONARY ANGIOGRAPHY N/A 08/17/2022   Procedure: LEFT HEART CATH AND CORONARY ANGIOGRAPHY;  Surgeon: CoSherren MochaMD;  [facility];  Service: Cardiovascular;  Laterality: N/A;   NM MYOVIEW LTD  07/2016   Normal.  EF 55-65% 63%).  No ischemia or infarction.  LOW RISK   NM MYOVIEW LTD  11/2018   6: 34 min.  7.2 METS.  Noted chest pain and tightness.  Horizontal ST of T wave depression (2 mm) II, 3, V5 and V6 (HIGH RISK), large/severe defect basal-apical inferior, inferolateral wall.  Large-severe basal-apical anterior-anterolateral wall.  Medium/moderate defect in basal and mid inferoseptal wall.  Consistent with large prior infarct with peri-infarct ischemia.  HIGH RISK   PERICARDIOCENTESIS N/A 12/05/2018   Procedure: PERICARDIOCENTESIS;  Surgeon: HaLeonie ManMD;  [facility];  Service: Cardiovascular;  Laterality: N/A;   SUBXYPHOID PERICARDIAL WINDOW N/A 12/25/2018   Procedure: SUBXYPHOID PERICARDIAL WINDOW;  Surgeon: BaGaye PollackMD;  [facility] Service: Thoracic;  Laterality: N/A;   TEE WITHOUT CARDIOVERSION N/A 12/25/2018   Procedure: TRANSESOPHAGEAL ECHOCARDIOGRAM (TEE);  Surgeon: [name], MD;  Location[facility];  Service: Thoracic;  Laterality: N/A;   tendon detachment Right    arm   TRANSTHORACIC ECHOCARDIOGRAM  07/2016   EF 60-65% with mild LVH.  Mild AI.  Trivial MR and TR.   VASECTOMY      Current Medications: Current Meds  Medication Sig   acetaminophen (TYLENOL) 500 MG tablet Take 500-1,000 mg by mouth every 6 (six) hours as needed for moderate pain.   amitriptyline (ELAVIL) 25 MG tablet Take 0.5 tablets (12.5 mg total) by mouth at bedtime as needed for sleep.   amLODipine (NORVASC) 2.5 MG tablet Take 1 tablet (2.5 mg total) by mouth daily.   atorvastatin (LIPITOR) 80 MG tablet TAKE 1 TABLET BY MOUTH EVERY DAY   clopidogrel (PLAVIX) 75 MG tablet TAKE 1 TABLET BY MOUTH EVERY DAY   ELIQUIS 5 MG TABS tablet Take 1 tablet (5 mg total) by mouth 2 (two) times daily.   escitalopram (LEXAPRO) 10 MG tablet Take 10 mg by mouth daily.   losartan (COZAAR) 50 MG  tablet TAKE 1 TABLET BY MOUTH EVERY DAY   metoprolol succinate (TOPROL-XL) 50 MG 24 hr tablet TAKE 1 TABLET BY MOUTH EVERY DAY WITH OR IMMEDIATELY FOLLOWING A MEAL (Patient taking differently: Take 50 mg by mouth daily.)   nitroGLYCERIN (NITROSTAT) 0.4 MG SL tablet Place 1 tablet (0.4 mg total) under the tongue every 5 (five) minutes as needed for chest pain.   pantoprazole (PROTONIX) 40 MG tablet TAKE ONE TABLET BY MOUTH ONE TIME DAILY. must make follow up appointment for further refills (Patient taking differently: Take 40 mg by mouth daily.)   potassium chloride SA (KLOR-CON M) 20 MEQ tablet Take 20 mEq by mouth daily as needed (when taking furosemide).   torsemide (DEMADEX) 20 MG tablet Take 20 mg by mouth 2 (two) times daily as needed.   Current Facility-Administered Medications for the 11/23/22 encounter (Office Visit) with Trudi Ida, NP  Medication   sodium chloride flush (NS) 0.9 % injection 3 mL     Allergies:   Penicillins   Social History   Socioeconomic History   Marital status: Married    Spouse name: Not on file   Number of children: Not on file   Years of education: Not on file   Highest education level: Not on file  Occupational History   Occupation: Retired  Tobacco Use   Smoking status: Never   Smokeless tobacco: Never  Vaping Use   Vaping Use: Never used  Substance and Sexual Activity   Alcohol use: No   Drug use: No   Sexual activity: Not on file  Other Topics Concern   Not on file  Social History Narrative   Patient is married with 2 sons and retired   Never smoker no alcohol or tobacco now and no caffeine now   Social Determinants of Radio broadcast assistant Strain: Not on file  Food Insecurity: Not on file  Transportation Needs: Not on file  Physical Activity: Not on file  Stress: Not on file  Social Connections: Not on file     Family History: The patient's family history includes COPD in his father; Diabetes in his brother, mother,  and sister; Heart attack in his paternal grandmother and sister; Heart disease in his father and mother; Heart failure in his father, mother, and sister; Hypertension in his mother;  Other in his father and maternal grandfather; Pancreatic cancer in his paternal aunt and paternal uncle; Stroke in his father. There is no history of Colon cancer, Esophageal cancer, Stomach cancer, or Rectal cancer.  ROS:   Please see the history of present illness.    All other systems reviewed and are negative.  EKGs/Labs/Other Studies Reviewed:    The following studies were reviewed today:  08/17/2022 left heart cath -    Ost 1st Diag lesion is 30% stenosed.   1.  Patent left main with no obstructive disease 2.  Patent LAD, including the stented segment in the proximal vessel, with continued patency of the diagonal branch that was intervened upon in 2020 3.  Patent circumflex with mild to moderate nonobstructive stenosis in the mid vessel after the first OM branch, does not appear flow-limiting 4.  Patent RCA without significant stenosis 5.  Normal LVEDP   Recommendations: Ongoing medical therapy for nonobstructive coronary artery disease  03/27/21 echo complete - EF 60-65%, RV moderately enlarged, trivial MR, trivial AR    EKG:  EKG is not ordered today.    Recent Labs: 08/10/2022: Hemoglobin 12.5; Platelets 176 10/05/2022: ALT 27; BUN 13; Creatinine, Ser 1.22; Potassium 4.5; Sodium 138   Per recent PCP visit on 11/19/22 - Cr 1.0, GFR 89, Na 138, K 4.6, hgb 12.3, HCT 34.8, RBC 3.9  Recent Lipid Panel    Component Value Date/Time   CHOL 91 (L) 10/05/2022 0927   TRIG 69 10/05/2022 0927   HDL 32 (L) 10/05/2022 0927   CHOLHDL 2.5 01/07/2022 0910   CHOLHDL 3.0 12/21/2018 0528   VLDL 11 12/21/2018 0528   LDLCALC 44 10/05/2022 0927     Risk Assessment/Calculations:    CHA2DS2-VASc Score = 7   This indicates a 11.2% annual risk of stroke. The patient's score is based upon: CHF History: 1 HTN  History: 1 Diabetes History: 1 Stroke History: 2 Vascular Disease History: 1 Age Score: 1 Gender Score: 0               Physical Exam:    VS:  BP 136/60   Pulse 67   Ht '5\' 10"'$  (1.778 m)   Wt 283 lb (128.4 kg)   BMI 40.61 kg/m     Wt Readings from Last 3 Encounters:  11/23/22 283 lb (128.4 kg)  10/05/22 263 lb (119.3 kg)  09/15/22 275 lb (124.7 kg)     GEN:  Well nourished, well developed in no acute distress HEENT: Normal NECK: No JVD; No carotid bruits LYMPHATICS: No lymphadenopathy CARDIAC: RRR, no murmurs, rubs, gallops RESPIRATORY:  Clear to auscultation without rales, wheezing or rhonchi  ABDOMEN: Soft, non-tender, non-distended MUSCULOSKELETAL:  +1 pedal edema mid tibia; No deformity  SKIN: Warm and dry NEUROLOGIC:  Alert and oriented x 3 PSYCHIATRIC:  Normal affect   ASSESSMENT:    1. CAD S/P percutaneous coronary angioplasty   2. Chronic diastolic heart failure ([place])   3. PAF (paroxysmal atrial fibrillation) ([place])   4. Essential hypertension   5. Hyperlipidemia LDL goal <70   6. Chronic anticoagulation   7. Weight gain    PLAN:    In order of problems listed above:  CAD - s/p LHC on 08/17/22, recommendations for non-obstuctive medical management,  Stable with no anginal symptoms. No indication for ischemic evaluation. On GDMT of Plavix, metoprolol, ntg (PRN has not needed), and lipitor. Chronic diastolic HF - NYHA class 1, noted pedal edema today, he will start weighing daily and  discussed parameters when for to take his Demadex. On GDMT of losartan, metoprolol, Demadex. Could consider more routine dosing of Demadex (Tu, Th) if PRN is not as helpful.  PAF - SR noted today on evaluation, continue Eliquis (no indication for dose reduction), continue metoprolol. CHA2DS2-VASc Score = 7 [CHF History: 1, HTN History: 1, Diabetes History: 1, Stroke History: 2, Vascular Disease History: 1, Age Score: 1, Gender Score: 0].  Therefore, the patient's annual risk of  stroke is 11.2 %.     HTN - BP today 136/60, continue amlodipine, losartan, metoprolol HLD - LDL on 10/05/22 is 44, at goal, continue atorvastatin 80 mg daily Weight gain - Had previously lost ~ 50 lbs but gained most of that back. He is motivated to lose additional weight and has an appt next week with [facility]. Discussed starting Ozempic again for additional support, however he does not want to restart it. He plans to start working out in his home gym.     Disposition - follow up in 3 months        Medication Adjustments/Labs and Tests Ordered: Current medicines are reviewed at length with the patient today.  Concerns regarding medicines are outlined above.  No orders of the defined types were placed in this encounter.  No orders of the defined types were placed in this encounter.   Patient Instructions  Medication Instructions:  Your Physician recommend you continue on your current medication as directed.    *If you need a refill on your cardiac medications before your next appointment, please call your pharmacy*  Follow-Up: At [facility], you and your health needs are our priority.  As part of our continuing mission to provide you with exceptional heart care, we have created designated Provider Care Teams.  These Care Teams include your primary Cardiologist (physician) and Advanced Practice Providers (APPs -  Physician Assistants and Nurse Practitioners) who all work together to provide you with the care you need, when you need it.  We recommend signing up for the patient portal called "MyChart".  Sign up information is provided on this After Visit Summary.  MyChart is used to connect with patients for Virtual Visits (Telemedicine).  Patients are able to view lab/test results, encounter notes, upcoming appointments, etc.  Non-urgent messages can be sent to your provider as well.   To learn more about what you can do with MyChart, go to  [email or web address].    Your next appointment:   3 month(s)  Provider:   Skeet Latch, MD or Laurann Montana, NP    Other Instructions Recommend weighing daily and keeping a log. If you note weight gain of 2 pounds overnight or 5 pounds in one week please take  one of your Torsemide pills. If you are needing to take this more than twice a week for a couple weeks please call the office.   Date  Time Weight                                               Signed, Trudi Ida, NP  11/23/2022 9:48 AM    [place]

## 2022-11-23 ENCOUNTER — Ambulatory Visit (HOSPITAL_BASED_OUTPATIENT_CLINIC_OR_DEPARTMENT_OTHER): Payer: Medicare HMO | Admitting: Cardiology

## 2022-11-23 ENCOUNTER — Encounter (HOSPITAL_BASED_OUTPATIENT_CLINIC_OR_DEPARTMENT_OTHER): Payer: Self-pay | Admitting: Cardiology

## 2022-11-23 VITALS — BP 136/60 | HR 67 | Ht 70.0 in | Wt 283.0 lb

## 2022-11-23 DIAGNOSIS — I2510 Atherosclerotic heart disease of native coronary artery without angina pectoris: Secondary | ICD-10-CM

## 2022-11-23 DIAGNOSIS — I480 Paroxysmal atrial fibrillation: Secondary | ICD-10-CM | POA: Diagnosis not present

## 2022-11-23 DIAGNOSIS — R635 Abnormal weight gain: Secondary | ICD-10-CM

## 2022-11-23 DIAGNOSIS — I10 Essential (primary) hypertension: Secondary | ICD-10-CM | POA: Diagnosis not present

## 2022-11-23 DIAGNOSIS — I5032 Chronic diastolic (congestive) heart failure: Secondary | ICD-10-CM

## 2022-11-23 DIAGNOSIS — Z7901 Long term (current) use of anticoagulants: Secondary | ICD-10-CM

## 2022-11-23 DIAGNOSIS — E785 Hyperlipidemia, unspecified: Secondary | ICD-10-CM

## 2022-11-23 DIAGNOSIS — Z9861 Coronary angioplasty status: Secondary | ICD-10-CM | POA: Diagnosis not present

## 2022-11-23 NOTE — Patient Instructions (Addendum)
Medication Instructions:  Your Physician recommend you continue on your current medication as directed.    *If you need a refill on your cardiac medications before your next appointment, please call your pharmacy*  Follow-Up: At [facility], you and your health needs are our priority.  As part of our continuing mission to provide you with exceptional heart care, we have created designated Provider Care Teams.  These Care Teams include your primary Cardiologist (physician) and Advanced Practice Providers (APPs -  Physician Assistants and Nurse Practitioners) who all work together to provide you with the care you need, when you need it.  We recommend signing up for the patient portal called "MyChart".  Sign up information is provided on this After Visit Summary.  MyChart is used to connect with patients for Virtual Visits (Telemedicine).  Patients are able to view lab/test results, encounter notes, upcoming appointments, etc.  Non-urgent messages can be sent to your provider as well.   To learn more about what you can do with MyChart, go to [email or web address].    Your next appointment:   3 month(s)  Provider:   Skeet Latch, MD or Laurann Montana, NP    Other Instructions Recommend weighing daily and keeping a log. If you note weight gain of 2 pounds overnight or 5 pounds in one week please take  one of your Torsemide pills. If you are needing to take this more than twice a week for a couple weeks please call the office.   Date  Time Weight

## 2022-11-29 ENCOUNTER — Encounter (INDEPENDENT_AMBULATORY_CARE_PROVIDER_SITE_OTHER): Payer: Self-pay | Admitting: Family Medicine

## 2022-11-29 ENCOUNTER — Ambulatory Visit (INDEPENDENT_AMBULATORY_CARE_PROVIDER_SITE_OTHER): Payer: Medicare HMO | Admitting: Family Medicine

## 2022-11-29 VITALS — BP 129/66 | HR 66 | Temp 98.2°F | Ht 70.0 in | Wt 278.0 lb

## 2022-11-29 DIAGNOSIS — I152 Hypertension secondary to endocrine disorders: Secondary | ICD-10-CM | POA: Diagnosis not present

## 2022-11-29 DIAGNOSIS — E1159 Type 2 diabetes mellitus with other circulatory complications: Secondary | ICD-10-CM | POA: Diagnosis not present

## 2022-11-29 DIAGNOSIS — Z6839 Body mass index (BMI) 39.0-39.9, adult: Secondary | ICD-10-CM

## 2022-11-29 DIAGNOSIS — E669 Obesity, unspecified: Secondary | ICD-10-CM

## 2022-11-29 NOTE — Progress Notes (Deleted)
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  He noticed a significant increase in fluid and hasn't been able to get all the fluid off.  He followed breakfast and dinner fairly close to Category 4 plan.  Lunch is difficult due to what is available on the road.  Wants to really recommit to meal plan.  Wants to continue with Cat 4.  He is done with working now.  He is eating out frequently and recognizes this has to change; financially this is costly.

## 2022-12-09 NOTE — Progress Notes (Signed)
Chief Complaint:   OBESITY Devon Mathews is here to discuss his progress with his obesity treatment plan along with follow-up of his obesity related diagnoses. Devon Mathews is on the Category 4 Plan and states he is following his eating plan approximately 60% of the time. Quincey states he is walking 30 minutes 2 times per week.  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  He noticed a significant increase in fluid and hasn't been able to get all the fluid off.  He followed breakfast and dinner fairly close to Category 4 plan.  Lunch is difficult due to what is available on the road.  Wants to really recommit to meal plan.  Wants to continue with Cat 4.  He is done with working now.  He is eating out frequently and recognizes this has to change; financially this is costly.  Subjective:   1. Hypertension associated with diabetes ([place]) Devon Mathews is on Norvasc, Cozaar, Toprol, Nitrostat and Demadex.  Blood pressure well controlled today.  Sees cardiology.  Assessment/Plan:   1. Hypertension associated with diabetes ([place]) Will continue current medications without any changes in medication or dosage.  2. BMI 39.0-39.9,adult  3. Obesity with current BMI of 39.9 Devon Mathews is currently in the action stage of change. As such, his goal is to continue with weight loss efforts. He has agreed to the Category 4 Plan and keeping a food journal and adhering to recommended goals of 550-700 calories and 50+ grams of protein at supper.   Exercise goals: No exercise has been prescribed at this time.  Behavioral modification strategies: decreasing simple carbohydrates, decreasing eating out, meal planning and cooking strategies, and planning for success.  [name] has agreed to follow-up with our  clinic in 3 weeks. He was informed of the importance of frequent follow-up visits to maximize his success with intensive lifestyle modifications for his multiple health conditions.   Objective:   Blood pressure 129/66, pulse 66, temperature 98.2 F (36.8 C), height '5\' 10"'$  (1.778 m), weight 278 lb (126.1 kg), SpO2 97 %. Body mass index is 39.89 kg/m.  General: Cooperative, alert, well developed, in no acute distress. HEENT: Conjunctivae and lids unremarkable. Cardiovascular: Regular rhythm.  Lungs: Normal work of breathing. Neurologic: No focal deficits.   Lab Results  Component Value Date   CREATININE 1.22 10/05/2022   BUN 13 10/05/2022   NA 138 10/05/2022   K 4.5 10/05/2022   CL 102 10/05/2022   CO2 22 10/05/2022   Lab Results  Component Value Date   ALT 27 10/05/2022   AST 23 10/05/2022   ALKPHOS 79 10/05/2022   BILITOT 0.6 10/05/2022   Lab Results  Component Value Date   HGBA1C 5.9 (H) 10/05/2022   HGBA1C 5.6 09/21/2021   HGBA1C 6.2 04/17/2021   HGBA1C 6.9 (H) 03/09/2021   HGBA1C 5.5 12/21/2018   Lab Results  Component Value Date   INSULIN 28.3 (H) 10/05/2022   INSULIN 32.1 (H) 09/21/2021   INSULIN 55.5 (H) 04/28/2021   Lab Results  Component Value Date   TSH 5.530 (H) 09/21/2021   Lab Results  Component Value Date   CHOL 91 (L) 10/05/2022   HDL 32 (L) 10/05/2022   LDLCALC 44 10/05/2022   TRIG 69 10/05/2022  CHOLHDL 2.5 01/07/2022   Lab Results  Component Value Date   VD25OH 58.6 10/05/2022   VD25OH 77.0 09/21/2021   VD25OH 73.3 04/28/2021   Lab Results  Component Value Date   WBC 3.9 08/10/2022   HGB 12.5 (L) 08/10/2022   HCT 38.4 08/10/2022   MCV 96 08/10/2022   PLT 176 08/10/2022   No results found for: "IRON", "TIBC", "FERRITIN"  Attestation Statements:   Reviewed by clinician on day of visit: allergies, medications, problem list, medical history, surgical history, family history, social history, and previous encounter notes.  I,  Elnora Morrison, RMA am acting as transcriptionist for Coralie Common, MD. I have reviewed the above documentation for accuracy and completeness, and I agree with the above. - Coralie Common, MD

## 2022-12-14 ENCOUNTER — Encounter: Payer: Self-pay | Admitting: Internal Medicine

## 2022-12-20 ENCOUNTER — Ambulatory Visit (INDEPENDENT_AMBULATORY_CARE_PROVIDER_SITE_OTHER): Payer: Medicare HMO | Admitting: Family Medicine

## 2022-12-20 ENCOUNTER — Encounter (INDEPENDENT_AMBULATORY_CARE_PROVIDER_SITE_OTHER): Payer: Self-pay | Admitting: Family Medicine

## 2022-12-20 ENCOUNTER — Encounter: Payer: Self-pay | Admitting: Internal Medicine

## 2022-12-20 VITALS — BP 135/65 | HR 67 | Temp 97.8°F | Ht 70.0 in | Wt 276.0 lb

## 2022-12-20 DIAGNOSIS — E669 Obesity, unspecified: Secondary | ICD-10-CM

## 2022-12-20 DIAGNOSIS — I152 Hypertension secondary to endocrine disorders: Secondary | ICD-10-CM | POA: Diagnosis not present

## 2022-12-20 DIAGNOSIS — E1159 Type 2 diabetes mellitus with other circulatory complications: Secondary | ICD-10-CM | POA: Diagnosis not present

## 2022-12-20 DIAGNOSIS — Z6839 Body mass index (BMI) 39.0-39.9, adult: Secondary | ICD-10-CM | POA: Diagnosis not present

## 2022-12-20 DIAGNOSIS — E1165 Type 2 diabetes mellitus with hyperglycemia: Secondary | ICD-10-CM | POA: Diagnosis not present

## 2022-12-20 NOTE — Progress Notes (Signed)
Chief Complaint:   OBESITY Devon Mathews is here to discuss his progress with his obesity treatment plan along with follow-up of his obesity related diagnoses. Devon Mathews is on the Category 4 Plan and keeping a food journal and adhering to recommended goals of 550-700 calories and 50+ grams protein and states he is following his eating plan approximately 60-70% of the time. [name] states he is not currently exercising.  Today's visit was #: 24 Starting weight: 284 lbs Starting date: 04/28/2021 Today's weight: 276 lbs Today's date: 12/20/2022 Total lbs lost to date: 8 Total lbs lost since last in-office visit: 2  Interim History: Since last appointment he has quite a few doctor's appointments.  He saw a wellness doctor in [place] and was diagnosed with neuropathy, restless leg syndrome and bone spurs.  Weighed a few days ago and was 270lb and then the next day was 276lb.  Yesterday he was in bed most of the day due to fatigue and soreness from helping his grandson work on his truck.  No upcoming plans the next few weeks except a few doctors appointments.  Does think he is getting all food in at supper meal.  Has gotten an additional protein shake from wellness doctor along with prior protein shake he was drinking.  Subjective:   1. Type 2 diabetes mellitus with hyperglycemia, without long-term current use of insulin ([place]) Devon Mathews's last A1c was 5.8 on 11/19/2022 with his PCP. He is not on medication.  2. Hypertension associated with diabetes ([place]) BP controlled today. Devon Mathews is on Norvasc, Cozaar, Toprol, and Demadex.  Assessment/Plan:   1. Type 2 diabetes mellitus with hyperglycemia, without long-term current use of insulin (HCC) Continue category 4 plan with no change treatment. Repeat lab in June/July.  2. Hypertension associated with diabetes ([place]) Continue current meds with no change in meds or doses.  3. BMI 39.0-39.9,adult 4. Obesity with starting BMI of 41.9 Devon Mathews is currently in the action stage  of change. As such, his goal is to continue with weight loss efforts. He has agreed to the Category 4 Plan.   Exercise goals: Pt is to make plan for activity between now and next appt (start with 15 minutes 3 times a week). All adults should avoid inactivity. Some physical activity is better than none, and adults who participate in any amount of physical activity gain some health benefits.  Behavioral modification strategies: increasing lean protein intake, meal planning and cooking strategies, keeping healthy foods in the home, and planning for success.  Devon Mathews has agreed to follow-up with our clinic in 3-4 weeks. He was informed of the importance of frequent follow-up visits to maximize his success with intensive lifestyle modifications for his multiple health conditions.   Objective:   Blood pressure 135/65, pulse 67, temperature 97.8 F (36.6 C), height 5\' 10"  (1.778 m), weight 276 lb (125.2 kg), SpO2 97 %. Body mass index is 39.6 kg/m.  General: Cooperative, alert, well developed, in no acute distress. HEENT: Conjunctivae and lids unremarkable. Cardiovascular: Regular rhythm.  Lungs: Normal work of breathing. Neurologic: No focal deficits.   Lab Results  Component Value Date   CREATININE 1.22 10/05/2022   BUN 13 10/05/2022   NA 138 10/05/2022   K 4.5 10/05/2022   CL 102 10/05/2022   CO2 22 10/05/2022   Lab Results  Component Value Date   ALT 27 10/05/2022   AST 23 10/05/2022   ALKPHOS 79 10/05/2022   BILITOT 0.6 10/05/2022   Lab Results  Component  Value Date   HGBA1C 5.9 (H) 10/05/2022   HGBA1C 5.6 09/21/2021   HGBA1C 6.2 04/17/2021   HGBA1C 6.9 (H) 03/09/2021   HGBA1C 5.5 12/21/2018   Lab Results  Component Value Date   INSULIN 28.3 (H) 10/05/2022   INSULIN 32.1 (H) 09/21/2021   INSULIN 55.5 (H) 04/28/2021   Lab Results  Component Value Date   TSH 5.530 (H) 09/21/2021   Lab Results  Component Value Date   CHOL 91 (L) 10/05/2022   HDL 32 (L) 10/05/2022    LDLCALC 44 10/05/2022   TRIG 69 10/05/2022   CHOLHDL 2.5 01/07/2022   Lab Results  Component Value Date   VD25OH 58.6 10/05/2022   VD25OH 77.0 09/21/2021   VD25OH 73.3 04/28/2021   Lab Results  Component Value Date   WBC 3.9 08/10/2022   HGB 12.5 (L) 08/10/2022   HCT 38.4 08/10/2022   MCV 96 08/10/2022   PLT 176 08/10/2022    Attestation Statements:   Reviewed by clinician on day of visit: allergies, medications, problem list, medical history, surgical history, family history, social history, and previous encounter notes.  I, Kathlene November, BS, CMA, am acting as transcriptionist for Coralie Common, MD.   I have reviewed the above documentation for accuracy and completeness, and I agree with the above. - Coralie Common, MD

## 2022-12-21 DIAGNOSIS — L578 Other skin changes due to chronic exposure to nonionizing radiation: Secondary | ICD-10-CM | POA: Diagnosis not present

## 2022-12-21 DIAGNOSIS — L570 Actinic keratosis: Secondary | ICD-10-CM | POA: Diagnosis not present

## 2023-01-05 DIAGNOSIS — M1711 Unilateral primary osteoarthritis, right knee: Secondary | ICD-10-CM | POA: Diagnosis not present

## 2023-01-05 DIAGNOSIS — Z23 Encounter for immunization: Secondary | ICD-10-CM | POA: Diagnosis not present

## 2023-01-07 DIAGNOSIS — J069 Acute upper respiratory infection, unspecified: Secondary | ICD-10-CM | POA: Diagnosis not present

## 2023-01-07 DIAGNOSIS — Z7901 Long term (current) use of anticoagulants: Secondary | ICD-10-CM | POA: Diagnosis not present

## 2023-01-07 DIAGNOSIS — J029 Acute pharyngitis, unspecified: Secondary | ICD-10-CM | POA: Diagnosis not present

## 2023-01-07 DIAGNOSIS — I480 Paroxysmal atrial fibrillation: Secondary | ICD-10-CM | POA: Diagnosis not present

## 2023-01-07 DIAGNOSIS — I509 Heart failure, unspecified: Secondary | ICD-10-CM | POA: Diagnosis not present

## 2023-01-07 DIAGNOSIS — I119 Hypertensive heart disease without heart failure: Secondary | ICD-10-CM | POA: Diagnosis not present

## 2023-01-07 DIAGNOSIS — Z1152 Encounter for screening for COVID-19: Secondary | ICD-10-CM | POA: Diagnosis not present

## 2023-01-07 DIAGNOSIS — R051 Acute cough: Secondary | ICD-10-CM | POA: Diagnosis not present

## 2023-01-07 DIAGNOSIS — R0981 Nasal congestion: Secondary | ICD-10-CM | POA: Diagnosis not present

## 2023-01-10 ENCOUNTER — Ambulatory Visit (INDEPENDENT_AMBULATORY_CARE_PROVIDER_SITE_OTHER): Payer: Medicare HMO | Admitting: Family Medicine

## 2023-01-11 ENCOUNTER — Encounter (INDEPENDENT_AMBULATORY_CARE_PROVIDER_SITE_OTHER): Payer: Self-pay | Admitting: Family Medicine

## 2023-01-11 ENCOUNTER — Ambulatory Visit (INDEPENDENT_AMBULATORY_CARE_PROVIDER_SITE_OTHER): Payer: Medicare HMO | Admitting: Family Medicine

## 2023-01-11 VITALS — BP 128/65 | HR 70 | Temp 98.0°F | Ht 70.0 in | Wt 271.0 lb

## 2023-01-11 DIAGNOSIS — E1165 Type 2 diabetes mellitus with hyperglycemia: Secondary | ICD-10-CM | POA: Diagnosis not present

## 2023-01-11 DIAGNOSIS — E669 Obesity, unspecified: Secondary | ICD-10-CM | POA: Diagnosis not present

## 2023-01-11 DIAGNOSIS — E1159 Type 2 diabetes mellitus with other circulatory complications: Secondary | ICD-10-CM

## 2023-01-11 DIAGNOSIS — Z6838 Body mass index (BMI) 38.0-38.9, adult: Secondary | ICD-10-CM | POA: Diagnosis not present

## 2023-01-11 DIAGNOSIS — I152 Hypertension secondary to endocrine disorders: Secondary | ICD-10-CM | POA: Diagnosis not present

## 2023-01-11 NOTE — Progress Notes (Signed)
Chief Complaint:   OBESITY Devon Mathews is here to discuss his progress with his obesity treatment plan along with follow-up of his obesity related diagnoses. Devon Mathews is on the Category 4 Plan and states he is following his eating plan approximately 65% of the time. [name] states he is walking.  Today's visit was #: 25 Starting weight: 55 LBS Starting date: 04/28/2021 Today's weight: 271 LBS Today's date: 01/11/2023 Total lbs lost to date: 13 LBS Total lbs lost since last in-office visit: 5 LBS  Interim History: Patient has had a good few weeks. He has been trying to stay consistent on meal plan.  He is having some congestion and runny nose recently. He has followed plan around 65% of the time.  He isn't sure where there is still room for improvement.  He has worked in a coke a day.  He has been trying to increase total amount of protein for the day.  He is trying to stick closer to seafood like salmon and poultry and staying away from red meat.  He took pictures for the protein shake substitution but forgot his phone.  Next few weeks she doesn't have anything coming up.    Subjective:   1. Hypertension associated with diabetes ([place]) Patient blood pressure well-controlled today.  Patient denies chest pain, chest pressure, headache.  Patient is on amlodipine, losartan, Toprol, torsemide.  2. Type 2 diabetes mellitus with hyperglycemia, without long-term current use of insulin ([place]) Patient last A1c was 5.9 in December 2023.  Patient is not on any medications.  Assessment/Plan:   1. Hypertension associated with diabetes ([place]) Continue current medications.  No change in medications or dosing.  2. Type 2 diabetes mellitus with hyperglycemia, without long-term current use of insulin ([place]) Check labs at next appointment fasting.  3. BMI 38.0-38.9,adult  4. Obesity with starting BMI of 41.9 [name] is currently in the action stage of change. As such, his goal is to continue with weight loss efforts. He  has agreed to the Category 4 Plan.   Exercise goals: All adults should avoid inactivity. Some physical activity is better than none, and adults who participate in any amount of physical activity gain some health benefits.  Behavioral modification strategies: increasing lean protein intake, meal planning and cooking strategies, keeping healthy foods in the home, and planning for success.  Javarian has agreed to follow-up with our clinic in 4 weeks. He was informed of the importance of frequent follow-up visits to maximize his success with intensive lifestyle modifications for his multiple health conditions.   Objective:   Blood pressure 128/65, pulse 70, temperature 98 F (36.7 C), height 5\' 10"  (1.778 m), weight 271 lb (122.9 kg), SpO2 96 %. Body mass index is 38.88 kg/m.  General: Cooperative, alert, well developed, in no acute distress. HEENT: Conjunctivae and lids unremarkable. Cardiovascular: Regular rhythm.  Lungs: Normal work of breathing. Neurologic: No focal deficits.   Lab Results  Component Value Date   CREATININE 1.22 10/05/2022   BUN 13 10/05/2022   NA 138 10/05/2022   K 4.5 10/05/2022   CL 102 10/05/2022   CO2 22 10/05/2022   Lab Results  Component Value Date   ALT 27 10/05/2022   AST 23 10/05/2022   ALKPHOS 79 10/05/2022   BILITOT 0.6 10/05/2022   Lab Results  Component Value Date   HGBA1C 5.9 (H) 10/05/2022   HGBA1C 5.6 09/21/2021   HGBA1C 6.2 04/17/2021   HGBA1C 6.9 (H) 03/09/2021   HGBA1C 5.5 12/21/2018  Lab Results  Component Value Date   INSULIN 28.3 (H) 10/05/2022   INSULIN 32.1 (H) 09/21/2021   INSULIN 55.5 (H) 04/28/2021   Lab Results  Component Value Date   TSH 5.530 (H) 09/21/2021   Lab Results  Component Value Date   CHOL 91 (L) 10/05/2022   HDL 32 (L) 10/05/2022   LDLCALC 44 10/05/2022   TRIG 69 10/05/2022   CHOLHDL 2.5 01/07/2022   Lab Results  Component Value Date   VD25OH 58.6 10/05/2022   VD25OH 77.0 09/21/2021   VD25OH 73.3  04/28/2021   Lab Results  Component Value Date   WBC 3.9 08/10/2022   HGB 12.5 (L) 08/10/2022   HCT 38.4 08/10/2022   MCV 96 08/10/2022   PLT 176 08/10/2022   No results found for: "IRON", "TIBC", "FERRITIN"  Attestation Statements:   Reviewed by clinician on day of visit: allergies, medications, problem list, medical history, surgical history, family history, social history, and previous encounter notes.  I, Davy Pique, RMA, am acting as transcriptionist for Coralie Common, MD.  I have reviewed the above documentation for accuracy and completeness, and I agree with the above. - Coralie Common, MD

## 2023-01-12 DIAGNOSIS — M1711 Unilateral primary osteoarthritis, right knee: Secondary | ICD-10-CM | POA: Diagnosis not present

## 2023-01-19 DIAGNOSIS — M722 Plantar fascial fibromatosis: Secondary | ICD-10-CM | POA: Diagnosis not present

## 2023-01-19 DIAGNOSIS — M79671 Pain in right foot: Secondary | ICD-10-CM | POA: Diagnosis not present

## 2023-01-19 DIAGNOSIS — M1711 Unilateral primary osteoarthritis, right knee: Secondary | ICD-10-CM | POA: Diagnosis not present

## 2023-01-24 ENCOUNTER — Other Ambulatory Visit (HOSPITAL_BASED_OUTPATIENT_CLINIC_OR_DEPARTMENT_OTHER): Payer: Self-pay | Admitting: Cardiovascular Disease

## 2023-01-25 NOTE — Telephone Encounter (Signed)
Rx request sent to pharmacy.  

## 2023-02-14 ENCOUNTER — Encounter (INDEPENDENT_AMBULATORY_CARE_PROVIDER_SITE_OTHER): Payer: Self-pay | Admitting: Family Medicine

## 2023-02-14 ENCOUNTER — Ambulatory Visit (INDEPENDENT_AMBULATORY_CARE_PROVIDER_SITE_OTHER): Payer: Medicare HMO | Admitting: Family Medicine

## 2023-02-14 VITALS — BP 116/67 | HR 60 | Temp 98.0°F | Ht 70.0 in | Wt 268.0 lb

## 2023-02-14 DIAGNOSIS — E1165 Type 2 diabetes mellitus with hyperglycemia: Secondary | ICD-10-CM

## 2023-02-14 DIAGNOSIS — E1169 Type 2 diabetes mellitus with other specified complication: Secondary | ICD-10-CM

## 2023-02-14 DIAGNOSIS — Z7901 Long term (current) use of anticoagulants: Secondary | ICD-10-CM

## 2023-02-14 DIAGNOSIS — I152 Hypertension secondary to endocrine disorders: Secondary | ICD-10-CM | POA: Diagnosis not present

## 2023-02-14 DIAGNOSIS — E785 Hyperlipidemia, unspecified: Secondary | ICD-10-CM | POA: Diagnosis not present

## 2023-02-14 DIAGNOSIS — Z6838 Body mass index (BMI) 38.0-38.9, adult: Secondary | ICD-10-CM

## 2023-02-14 DIAGNOSIS — E1159 Type 2 diabetes mellitus with other circulatory complications: Secondary | ICD-10-CM

## 2023-02-14 DIAGNOSIS — E669 Obesity, unspecified: Secondary | ICD-10-CM

## 2023-02-14 MED ORDER — ELIQUIS 5 MG PO TABS: 5.0000 mg | ORAL_TABLET | Freq: Two times a day (BID) | ORAL | 0 refills | Status: AC

## 2023-02-14 NOTE — Progress Notes (Signed)
Chief Complaint:   OBESITY Devon Mathews is here to discuss his progress with his obesity treatment plan along with follow-up of his obesity related diagnoses. Devon Mathews is on the Category 4 Plan and states he is following his eating plan approximately 70% of the time. Devon Mathews states he is not exercising.  Today's visit was #: 26 Starting weight: 284 LBS Starting date: 04/28/2021 Today's weight: 268 LBS Today's date: 02/14/2023 Total lbs lost to date: 16 lbs Total lbs lost since last in-office visit: 3 LBS  Interim History: Patient is having some issues with his right knee.  He underwent steroid injections and gel shots and it isn't make much difference.  He can't squat or stand up like most people.  This has limited his activity.  Food wise he is sticking to meal plan most of the time.  He has gotten into eating grilled chicken and hamburgers quite a bit for lunch.  Eating a bun with burger with mustard. He believes he is getting in the full amount of activity at breakfast and lunch.  His neuropathy treatment is really helping as well.   Subjective:   1. Hypertension associated with type 2 diabetes mellitus (HCC) Blood pressure is well-controlled today.  Patient is on losartan, Toprol, torsemide, amlodipine.  2. Type 2 diabetes mellitus with hyperglycemia, without long-term current use of insulin ([facility]) Patient is not on any medications.  Patient's last A1c was 5.9.  3. Hyperlipidemia associated with type 2 diabetes mellitus ([facility]) Patient is on Lipitor daily.  No side effects noted.  4. Chronic anticoagulation Patient is on Eliquis twice daily.  Se[facility] [place] [facility].  Assessment/Plan:   1. Hypertension associated with type 2 diabetes mellitus ([facility]CC) Check labs today.  - Comprehensive metabolic panel  2. Type 2 diabetes mellitus with hyperglycemia, without long-term current use of insulin (HCC) Check labs today.  - Hemoglobin A1c - Insulin, random  3. Hyperlipidemia associated with type 2  diabetes mellitus ([facility]) Check labs today.  - Lipid Panel With LDL/HDL Ratio  4. Chronic anticoagulation Refill- ELIQUIS 5 MG TABS tablet; Take 1 tablet (5 mg total) by mouth 2 (two) times daily.  Dispense: 180 tablet; Refill: 0  5. BMI 38.0-38.9,adult  6. Obesity with starting BMI of 41.9 Devon Mathews is currently in the action stage of change. As such, his goal is to continue with weight loss efforts. He has agreed to the Category 4 Plan.   Exercise goals: All adults should avoid inactivity. Some physical activity is better than none, and adults who participate in any amount of physical activity gain some health benefits.  Try 2-3 times per week any physical activity.  Behavioral modification strategies: increasing lean protein intake, meal planning and cooking strategies, keeping healthy foods in the home, and planning for success.  Devon Mathews has agreed to follow-up with our clinic in 4 weeks. He was informed of the importance of frequent follow-up visits to maximize his success with intensive lifestyle modifications for his multiple health conditions.   Devon Mathews was informed we would discuss his lab results at his next visit unless there is a critical issue that needs to be addressed sooner. Devon Mathews agreed to keep his next visit at the agreed upon time to discuss these results.  Objective:   Blood pressure 116/67, pulse 60, temperature 98 F (36.7 C), height 5\' 10"  (1.778 m), weight 268 lb (121.6 kg), SpO2 98 %. Body mass index is 38.45 kg/m.  General: Cooperative, alert, well developed, in no acute distress. HEENT:  Conjunctivae and lids unremarkable. Cardiovascular: Regular rhythm.  Lungs: Normal work of breathing. Neurologic: No focal deficits.   Lab Results  Component Value Date   CREATININE 1.16 02/14/2023   BUN 16 02/14/2023   NA 140 02/14/2023   K 4.4 02/14/2023   CL 103 02/14/2023   CO2 22 02/14/2023   Lab Results  Component Value Date   ALT 27 02/14/2023   AST 26 02/14/2023    ALKPHOS 83 02/14/2023   BILITOT 0.5 02/14/2023   Lab Results  Component Value Date   HGBA1C 6.0 (H) 02/14/2023   HGBA1C 5.9 (H) 10/05/2022   HGBA1C 5.6 09/21/2021   HGBA1C 6.2 04/17/2021   HGBA1C 6.9 (H) 03/09/2021   Lab Results  Component Value Date   INSULIN 41.7 (H) 02/14/2023   INSULIN 28.3 (H) 10/05/2022   INSULIN 32.1 (H) 09/21/2021   INSULIN 55.5 (H) 04/28/2021   Lab Results  Component Value Date   TSH 5.530 (H) 09/21/2021   Lab Results  Component Value Date   CHOL 101 02/14/2023   HDL 34 (L) 02/14/2023   LDLCALC 48 02/14/2023   TRIG 99 02/14/2023   CHOLHDL 2.5 01/07/2022   Lab Results  Component Value Date   VD25OH 58.6 10/05/2022   VD25OH 77.0 09/21/2021   VD25OH 73.3 04/28/2021   Lab Results  Component Value Date   WBC 3.9 08/10/2022   HGB 12.5 (L) 08/10/2022   HCT 38.4 08/10/2022   MCV 96 08/10/2022   PLT 176 08/10/2022   No results found for: "IRON", "TIBC", "FERRITIN"  Attestation Statements:   Reviewed by clinician on day of visit: allergies, medications, problem list, medical history, surgical history, family history, social history, and previous encounter notes.  I, Malcolm Metro, RMA, am acting as transcriptionist for Reuben Likes, MD.  I have reviewed the above documentation for accuracy and completeness, and I agree with the above. - Reuben Likes, MD

## 2023-02-15 LAB — COMPREHENSIVE METABOLIC PANEL
ALT: 27 IU/L (ref 0–44)
AST: 26 IU/L (ref 0–40)
Albumin/Globulin Ratio: 1.6 (ref 1.2–2.2)
Albumin: 4.1 g/dL (ref 3.8–4.8)
Alkaline Phosphatase: 83 IU/L (ref 44–121)
BUN/Creatinine Ratio: 14 (ref 10–24)
BUN: 16 mg/dL (ref 8–27)
Bilirubin Total: 0.5 mg/dL (ref 0.0–1.2)
CO2: 22 mmol/L (ref 20–29)
Calcium: 9.2 mg/dL (ref 8.6–10.2)
Chloride: 103 mmol/L (ref 96–106)
Creatinine, Ser: 1.16 mg/dL (ref 0.76–1.27)
Globulin, Total: 2.5 g/dL (ref 1.5–4.5)
Glucose: 117 mg/dL — ABNORMAL HIGH (ref 70–99)
Potassium: 4.4 mmol/L (ref 3.5–5.2)
Sodium: 140 mmol/L (ref 134–144)
Total Protein: 6.6 g/dL (ref 6.0–8.5)
eGFR: 67 mL/min/{1.73_m2} (ref >59)

## 2023-02-15 LAB — LIPID PANEL WITH LDL/HDL RATIO
Cholesterol, Total: 101 mg/dL (ref 100–199)
HDL: 34 mg/dL — ABNORMAL LOW (ref >39)
LDL Chol Calc (NIH): 48 mg/dL (ref 0–99)
LDL/HDL Ratio: 1.4 ratio (ref 0.0–3.6)
Triglycerides: 99 mg/dL (ref 0–149)
VLDL Cholesterol Cal: 19 mg/dL (ref 5–40)

## 2023-02-15 LAB — HEMOGLOBIN A1C
Est. average glucose Bld gHb Est-mCnc: 126 mg/dL
Hgb A1c MFr Bld: 6 % — ABNORMAL HIGH (ref 4.8–5.6)

## 2023-02-15 LAB — INSULIN, RANDOM: INSULIN: 41.7 u[IU]/mL — ABNORMAL HIGH (ref 2.6–24.9)

## 2023-02-24 ENCOUNTER — Other Ambulatory Visit (INDEPENDENT_AMBULATORY_CARE_PROVIDER_SITE_OTHER): Payer: Self-pay | Admitting: Family Medicine

## 2023-02-24 DIAGNOSIS — Z7901 Long term (current) use of anticoagulants: Secondary | ICD-10-CM

## 2023-02-28 ENCOUNTER — Encounter (HOSPITAL_BASED_OUTPATIENT_CLINIC_OR_DEPARTMENT_OTHER): Payer: Self-pay | Admitting: Family

## 2023-02-28 ENCOUNTER — Ambulatory Visit (HOSPITAL_BASED_OUTPATIENT_CLINIC_OR_DEPARTMENT_OTHER): Payer: Medicare HMO | Admitting: Family

## 2023-02-28 VITALS — BP 124/60 | HR 60 | Ht 70.0 in | Wt 281.1 lb

## 2023-02-28 DIAGNOSIS — D6859 Other primary thrombophilia: Secondary | ICD-10-CM

## 2023-02-28 DIAGNOSIS — I10 Essential (primary) hypertension: Secondary | ICD-10-CM

## 2023-02-28 DIAGNOSIS — I25118 Atherosclerotic heart disease of native coronary artery with other forms of angina pectoris: Secondary | ICD-10-CM | POA: Diagnosis not present

## 2023-02-28 DIAGNOSIS — I5032 Chronic diastolic (congestive) heart failure: Secondary | ICD-10-CM

## 2023-02-28 NOTE — Addendum Note (Signed)
Addended by: Marlene Lard on: 02/28/2023 08:26 AM   Modules accepted: Orders

## 2023-02-28 NOTE — Progress Notes (Signed)
Office Visit    Patient Name: Devon Mathews. Date of Encounter: 02/28/2023  PCP:  Gaspar Garbe, MD   [facility]  Cardiologist:  Chilton Si, MD  Advanced Practice Provider:  No care team member to display Electrophysiologist:  None      Chief Complaint    Devon Mathews. is a 72 y.o. male presents today for CAD follow up    Past Medical History    Past Medical History:  Diagnosis Date   Anxiety    Atrial fibrillation (HCC)    CAD S/P percutaneous coronary angioplasty 09/13/2018    99% p-mLAD (BTW d1&d2) -> SYNERGY DES 3.5 X 12 (3.75 mm). 20% LM.  Cx, small RI & co-dom RCA normal.  EF 65%.    Chronic diastolic heart failure (HCC) 11/30/2019   Congestive heart failure (CHF) (HCC)    Diabetes mellitus without complication (HCC)    Dizziness 2012   normal findings..   Essential hypertension 07/28/2016   GERD (gastroesophageal reflux disease)    Hyperlipidemia    Lower extremity edema 07/28/2016   NSTEMI (non-ST elevated myocardial infarction) (HCC) 09/13/2018   The patient presented 09/13/2018 with a non-ST elevation MI, troponin peak was 0.15   Obesity    Obesity (BMI 30-39.9) 07/28/2016   OSA (obstructive sleep apnea) 07/28/2016   Restless leg syndrome    mild   SCCA (squamous cell carcinoma) of skin 08/24/2021   Left Forehead (in situ-inflitrating)   Shortness of breath 07/28/2016   Sleep apnea    Squamous cell carcinoma of skin 03/20/2015   right ant scalp(curetx3, 73fu)   Squamous cell carcinoma of skin 01/01/2016   left lateral forehead (curetx3, 40fu)   Squamous cell carcinoma of skin 06/15/2019   left mid anterior scalp (tx afte bx)   Squamous cell carcinoma of skin 01/08/2021   well diff- left forehead   Squamous cell carcinoma of skin 01/08/2021   in situ- mid frontal scalp   TIA (transient ischemic attack)    Tubular adenoma    Past Surgical History:  Procedure Laterality Date   COLONOSCOPY     CORONARY BALLOON  ANGIOPLASTY N/A 12/05/2018   Procedure: CORONARY BALLOON ANGIOPLASTY;  Surgeon: Marykay Lex, MD;  Location: [facility];  Service: Cardiovascular;  Laterality: N/A;   CORONARY STENT INTERVENTION N/A 09/13/2018   Procedure: CORONARY STENT INTERVENTION;  Surgeon: Lyn Records, MD;  Location: [facility];  Service: Cardiovascular;; p-m LAD (btw D1&D2) - SYNERGY DES 3.5 x 12 (3.75 mm).   ESOPHAGOGASTRODUODENOSCOPY (EGD) WITH PROPOFOL N/A 03/01/2022   Procedure: ESOPHAGOGASTRODUODENOSCOPY (EGD) WITH PROPOFOL;  Surgeon: Iva Boop, MD;  Location: [facility];  Service: Gastroenterology;  Laterality: N/A;  Polypectomy   GASTRIC VARICES BANDING  03/01/2022   Procedure: GASTRIC  BANDING;  Surgeon: Iva Boop, MD;  Location: [facility];  Service: Gastroenterology;;   KNEE ARTHROSCOPY Right    LEFT HEART CATH AND CORONARY ANGIOGRAPHY N/A 09/13/2018   Procedure: LEFT HEART CATH AND CORONARY ANGIOGRAPHY;  Surgeon: Lyn Records, MD;  Location: [facility];  Service: Cardiovascular;;  99% p-mLAD (BTW d1&d2) -> SYNERGY DES 3.5 X 12 (3.75 mm). 20% LM.  Cx, small RI & co-dom RCA normal.  EF 65%.    LEFT HEART CATH AND CORONARY ANGIOGRAPHY  2003   30-40% proximal segmental stenosis in the LAD   LEFT HEART CATH AND CORONARY ANGIOGRAPHY  2006   EF >50% &  no mitral regurgitation   LEFT HEART CATH AND CORONARY ANGIOGRAPHY N/A 12/05/2018   Procedure: LEFT HEART CATH AND CORONARY ANGIOGRAPHY;  Surgeon: Marykay Lex, MD;  Location: [facility] INVASIVE CV LAB;  Service: Cardiovascular;  Laterality: N/A;   LEFT HEART CATH AND CORONARY ANGIOGRAPHY N/A 08/17/2022   Procedure: LEFT HEART CATH AND CORONARY ANGIOGRAPHY;  Surgeon: Tonny Bollman, MD;  Location: [facility] INVASIVE CV LAB;  Service: Cardiovascular;  Laterality: N/A;   NM MYOVIEW LTD  07/2016   Normal.  EF 55-65% 63%).  No ischemia or infarction.  LOW RISK   NM MYOVIEW LTD  11/2018   6: 34 min.  7.2 METS.  Noted chest pain and  tightness.  Horizontal ST of T wave depression (2 mm) II, 3, V5 and V6 (HIGH RISK), large/severe defect basal-apical inferior, inferolateral wall.  Large-severe basal-apical anterior-anterolateral wall.  Medium/moderate defect in basal and mid inferoseptal wall.  Consistent with large prior infarct with peri-infarct ischemia.  HIGH RISK   PERICARDIOCENTESIS N/A 12/05/2018   Procedure: PERICARDIOCENTESIS;  Surgeon: Marykay Lex, MD;  Location: [facility] INVASIVE CV LAB;  Service: Cardiovascular;  Laterality: N/A;   SUBXYPHOID PERICARDIAL WINDOW N/A 12/25/2018   Procedure: SUBXYPHOID PERICARDIAL WINDOW;  Surgeon: Alleen Borne, MD;  Location: [facility] OR;  Service: Thoracic;  Laterality: N/A;   TEE WITHOUT CARDIOVERSION N/A 12/25/2018   Procedure: TRANSESOPHAGEAL ECHOCARDIOGRAM (TEE);  Surgeon: Alleen Borne, MD;  Location: [facility] OR;  Service: Thoracic;  Laterality: N/A;   tendon detachment Right    arm   TRANSTHORACIC ECHOCARDIOGRAM  07/2016   EF 60-65% with mild LVH.  Mild AI.  Trivial MR and TR.   VASECTOMY      Allergies  Allergies  Allergen Reactions   Penicillins Hives and Nausea And Vomiting    Did it involve swelling of the face/tongue/throat, SOB, or low BP? No Did it involve sudden or severe rash/hives, skin peeling, or any reaction on the inside of your mouth or nose? No Did you need to seek medical attention at a hospital or doctor's office? No When did it last happen?      30 + years If all above answers are "NO", may proceed with cephalosporin use.     History of Present Illness    Devon Mathews. is a 72 y.o. male with a hx of CAD (DES-mid LAD 2019; 95% jailed diagonal where previously placed LAD stent was patent), HTN, PAF, chronic diastolic heart failure, OSA on CPAP, DM 2, HLD, prior TIA last seen 11/23/2022 by Wallis Bamberg, NP.  Prior LHC 2000 340% LAD lesion.  Several Myoview subsequently all low risk.  Presented to [facility] 09/13/2018 with chest pain ruled in for NSTEMI and  underwent diagnostic catheterization with LAD stenting.  12/05/2018 admitted for diagnostic cath with 95% jailed D1 were previously placed LAD stent was patent, underwent PCI, vessel was perforated and developed a pericardial effusion which ultimately stabilized and was started on colchicine.  Seen 08/10/2022 by Dr. Duke Salvia noting chest pain in the setting of high stress.  Troponin was negative.  Ultimately underwent LHC revealing ostial first diagonal lesion 30% stenosed, patent LAD stent included stented segment in the proximal vessel, continued patency of diagonal branch.   Last seen 11/23/22. He noted gradual 50 lbs weight gain and politely declined resuming Ozempic as did not feel much improvement.    Presents today for follow up independently. Notes a couple of falls since last seen related to neuropathy and has established with a  new provider regarding this and is doing red light therapy, shake with nitric oxide. Does feel it is improving with these interventions. No significant bruising with Eliquis. He is taking Torsemide about once a week with good response. No formal exercise routine but plans to start walking.   Reports no shortness of breath nor dyspnea on exertion. Reports no chest pain, pressure, or tightness. No orthopnea, PND. Reports no palpitations.    EKGs/Labs/Other Studies Reviewed:   The following studies were reviewed today: Cardiac Studies & Procedures   CARDIAC CATHETERIZATION  CARDIAC CATHETERIZATION 08/17/2022  Narrative   Ost 1st Diag lesion is 30% stenosed.  1.  Patent left main with no obstructive disease 2.  Patent LAD, including the stented segment in the proximal vessel, with continued patency of the diagonal branch that was intervened upon in 2020 3.  Patent circumflex with mild to moderate nonobstructive stenosis in the mid vessel after the first OM branch, does not appear flow-limiting 4.  Patent RCA without significant stenosis 5.  Normal  LVEDP  Recommendations: Ongoing medical therapy for nonobstructive coronary artery disease  Findings Coronary Findings Diagnostic  Dominance: Right  Left Anterior Descending Prox LAD lesion is 30% stenosed. The lesion was previously treated . Mid LAD lesion is 30% stenosed.  First Diagonal Branch The diagonal branch, previously intervened upon, and is now occluded at its origin with no collateral flow present to visualize this vessel. Ost 1st Diag lesion is 30% stenosed.  Left Circumflex Prox Cx lesion is 50% stenosed.  Right Coronary Artery The vessel exhibits minimal luminal irregularities. Dominant vessel, minimal irregularity with no obstructive disease.  Supplies a PDA and PLA branch.  Right Posterior Descending Artery The vessel exhibits minimal luminal irregularities.  Intervention  No interventions have been documented.   CARDIAC CATHETERIZATION  CARDIAC CATHETERIZATION 12/05/2018  Narrative  Previously placed Prox LAD drug eluting stent is widely patent.  Ost 1st Diag lesion is 95% stenosed.  Scoring balloon angioplasty was performed using a BALLOON WOLVERINE 2.50X10.  Post intervention, there is a 20% residual stenosis.  The left ventricular systolic function is normal. The left ventricular ejection fraction is 55-65% by visual estimate.  Successful occlusion of distal diagonal branch with prolonged balloon inflations and protamine infusion after initial wire related perforation of the distal diagonal branch.  1st Diag lesion is 100% stenosed.  SUMMARY  Culprit lesion:  90% ostial 1st Diag (jailed) -successful scoring balloon angioplasty using 2.5 mm Wolverine scoring balloon  --Distal diagonal wire perforation with pericardial effusion and initial stages of pericardial tamponade --> successful occlusion with prolonged balloon inflations and administration of protamine  --Unsuccessful pericardiocentesis  Widely patent LAD stent  Otherwise stable  coronary arteries.  Normal LVEF and EDP pre-PCI  Clear ST elevations for roughly 15 minutes, most likely type for MI.   RECOMMENDATIONS  Admit to CCU on Levophed --continue Levophed overnight  Monitor closely for signs of tamponade.  Follow troponins for likely type IV MI  Stat echocardiogram ordered.  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  Dr. Laneta Simmers from CT surgery is aware the patient & current condition  Findings Coronary Findings Diagnostic  Dominance: Right  Left Anterior Descending Previously placed Prox LAD drug eluting stent is widely patent.  First Diagonal Branch Ost 1st Diag lesion is 95% stenosed. The lesion is located at the bifurcation and eccentric. Jailed 1st Diag lesion  is 100% stenosed. Distal diagonal branch occlusion after prolonged balloon angioplasty.  This was in response to wire perforation.  Intervention  Ost 1st Diag lesion Angioplasty Lesion length:  10 mm. CATH VISTA GUIDE 6FR XBLAD3.5 guide catheter was inserted. WIRE SION BLACK 190 guidewire used to cross lesion. Scoring balloon angioplasty was performed using a BALLOON WOLVERINE 2.50X10. Maximum pressure: 12 atm. Inflation time: 60 sec. WIRE SION Blue used for LAD protection Post-Intervention Lesion Assessment The intervention was successful. Pre-interventional TIMI flow is 3. Post-intervention TIMI flow is 3. Treated lesion length:  10 mm. At this lesion, a perforation of the vessel occurred. -Unfortunately, the side on black wire advanced too far distally with scoring balloon removal and this led to distal diagonal wire perforation. With post PTCA angiography revealing distal perforation, I used the 2.0 mm balloon that initially was used for predilation for prolonged inflations in the distal diagonal branch.  Initial attempts seem to be successful, however there  was still blush.  The patient then became hypotensive with concerns for possible tamponade physiology.  He was started on IV Levophed and given a 1000 mL bolus. The balloon was then reinserted with now prolonged inflations of 15, 15 and 20 minutes held.  After first prolonged inflation, there was still some blush, therefore protamine was administered.  After the second do, no further blush was noted.  Of note, the patient did become quite symptomatic with the dropping of his blood pressure and then began to have significant chest pain with a prolonged balloon inflation on the second occasion. There is a 20% residual stenosis post intervention.   STRESS TESTS  MYOCARDIAL PERFUSION IMAGING 12/01/2018  Narrative  Nuclear stress EF: 54%.  The left ventricular ejection fraction is mildly decreased (45-54%).  Horizontal ST segment depression ST segment depression of 2 mm was noted during stress in the II, III, V5 and V6 leads, beginning at 8 minutes of stressST deviation beginning in recovery.  Defect 1: There is a large defect of severe severity present in the basal inferior, basal inferolateral, mid inferior, mid inferolateral, apical inferior, apical lateral and apex location.  Defect 2: There is a large defect of severe severity present in the basal anterior, basal anterolateral, mid anterior, mid anterolateral, apical anterior and apex location.  Defect 3: There is a medium defect of moderate severity present in the basal inferoseptal and mid inferoseptal location.  Findings consistent with ischemia and prior myocardial infarction with peri-infarct ischemia.  This is a high risk study.  There is a medium size, severe perfusion defect in the inferior and inferolateral walls with stress that is partially reversible at rest, returning to moderate. Base to apex. There is a absent, large perfusion defect in the anterior wall from base to apex. It is partially reversible at rest and returns to  moderate at the base and mid ventricle and returns to mild at the apex. Anterolateral defect is severe, and with full reversibility. Medium size, moderate perfusion defect in the inferoseptum with partial reversibility, returning to mild at rest.  ECG positive for ischemia in recovery.  Chest pain and shortness of breath with peak stress.  High risk study. Consider coronary angiography if clinically indicated.   ECHOCARDIOGRAM  ECHOCARDIOGRAM COMPLETE 03/27/2021  Narrative ECHOCARDIOGRAM REPORT    Patient Name:   Devon Mathews. Date of Exam: 03/27/2021 Medical Rec #:  [id number]      Height:       70.0 in Accession #:    [id number]  Weight:       295.0 lb Date of Birth:  05/20/1951       BSA:          2.462 m Patient Age:    31 years       BP:           130/60 mmHg Patient Gender: M              HR:           72 bpm. Exam Location:  [address]  Procedure: 2D Echo and 3D Echo  Indications:    Congestive Heart Failure I50.9  History:        Patient has prior history of Echocardiogram examinations, most recent 12/03/2019. CAD and Previous Myocardial Infarction; Risk Factors:Hypertension.  Sonographer:    Thurman Coyer RDCS Referring Phys: [id number] [place]  IMPRESSIONS   1. Left ventricular ejection fraction, by estimation, is 60 to 65%. The left ventricle has normal function. The left ventricle has no regional wall motion abnormalities. Left ventricular diastolic parameters were normal. 2. Right ventricular systolic function is normal. The right ventricular size is moderately enlarged. There is normal pulmonary artery systolic pressure. The estimated right ventricular systolic pressure is 24.5 mmHg. 3. The mitral valve is normal in structure. Trivial mitral valve regurgitation. No evidence of mitral stenosis. 4. The aortic valve is tricuspid. Aortic valve regurgitation is trivial. No aortic stenosis is present. 5. The inferior vena cava is normal in size with  greater than 50% respiratory variability, suggesting right atrial pressure of 3 mmHg.  Comparison(s): No significant change from prior study.  FINDINGS Left Ventricle: Left ventricular ejection fraction, by estimation, is 60 to 65%. The left ventricle has normal function. The left ventricle has no regional wall motion abnormalities. 3D left ventricular ejection fraction analysis performed but not reported based on interpreter judgement due to suboptimal quality. The left ventricular internal cavity size was normal in size. There is borderline left ventricular hypertrophy. Left ventricular diastolic parameters were normal.  Right Ventricle: The right ventricular size is moderately enlarged. No increase in right ventricular wall thickness. Right ventricular systolic function is normal. There is normal pulmonary artery systolic pressure. The tricuspid regurgitant velocity is 2.32 m/s, and with an assumed right atrial pressure of 3 mmHg, the estimated right ventricular systolic pressure is 24.5 mmHg.  Left Atrium: Left atrial size was normal in size.  Right Atrium: Right atrial size was normal in size.  Pericardium: There is no evidence of pericardial effusion.  Mitral Valve: The mitral valve is normal in structure. Trivial mitral valve regurgitation. No evidence of mitral valve stenosis.  Tricuspid Valve: The tricuspid valve is normal in structure. Tricuspid valve regurgitation is trivial.  Aortic Valve: The aortic valve is tricuspid. Aortic valve regurgitation is trivial. No aortic stenosis is present.  Pulmonic Valve: The pulmonic valve was normal in structure. Pulmonic valve regurgitation is trivial.  Aorta: The aortic root and ascending aorta are structurally normal, with no evidence of dilitation.  Venous: The inferior vena cava is normal in size with greater than 50% respiratory variability, suggesting right atrial pressure of 3 mmHg.  IAS/Shunts: No atrial level shunt detected by  color flow Doppler.   LEFT VENTRICLE PLAX 2D LVIDd:         5.30 cm  Diastology LVIDs:         3.60 cm  LV e' medial:    9.25 cm/s LV PW:  1.00 cm  LV E/e' medial:  10.5 LV IVS:        1.00 cm  LV e' lateral:   8.70 cm/s LVOT diam:     2.10 cm  LV E/e' lateral: 11.2 LV SV:         90 LV SV Index:   37 LVOT Area:     3.46 cm  3D Volume EF: LV EDV:       199 ml LV ESV:       88 ml LV SV:        111 ml  RIGHT VENTRICLE RV S prime:     11.50 cm/s TAPSE (M-mode): 1.7 cm  LEFT ATRIUM           Index       RIGHT ATRIUM           Index LA diam:      3.40 cm 1.38 cm/m  RA Area:     24.70 cm LA Vol (A2C): 81.5 ml 33.10 ml/m RA Volume:   82.10 ml  33.34 ml/m LA Vol (A4C): 71.3 ml 28.96 ml/m AORTIC VALVE LVOT Vmax:   126.00 cm/s LVOT Vmean:  81.800 cm/s LVOT VTI:    0.261 m  AORTA Ao Root diam: 3.30 cm  MITRAL VALVE               TRICUSPID VALVE MV Area (PHT): 3.12 cm    TR Peak grad:   21.5 mmHg MV Decel Time: 243 msec    TR Vmax:        232.00 cm/s MV E velocity: 97.40 cm/s MV A velocity: 97.90 cm/s  SHUNTS MV E/A ratio:  0.99        Systemic VTI:  0.26 m Systemic Diam: 2.10 cm  Laurance Flatten MD Electronically signed by Laurance Flatten MD Signature Date/Time: 03/27/2021/11:29:08 AM    Final              EKG:  EKG is not ordered today.    Recent Labs: 08/10/2022: Hemoglobin 12.5; Platelets 176 02/14/2023: ALT 27; BUN 16; Creatinine, Ser 1.16; Potassium 4.4; Sodium 140  Recent Lipid Panel    Component Value Date/Time   CHOL 101 02/14/2023 0848   TRIG 99 02/14/2023 0848   HDL 34 (L) 02/14/2023 0848   CHOLHDL 2.5 01/07/2022 0910   CHOLHDL 3.0 12/21/2018 0528   VLDL 11 12/21/2018 0528   LDLCALC 48 02/14/2023 0848    Risk Assessment/Calculations:   CHA2DS2-VASc Score = 7   This indicates a 11.2% annual risk of stroke. The patient's score is based upon: CHF History: 1 HTN History: 1 Diabetes History: 1 Stroke History: 2 Vascular Disease  History: 1 Age Score: 1 Gender Score: 0     Home Medications   Current Meds  Medication Sig   acetaminophen (TYLENOL) 500 MG tablet Take 500-1,000 mg by mouth every 6 (six) hours as needed for moderate pain.   amitriptyline (ELAVIL) 25 MG tablet Take 0.5 tablets (12.5 mg total) by mouth at bedtime as needed for sleep.   amLODipine (NORVASC) 2.5 MG tablet Take 1 tablet (2.5 mg total) by mouth daily.   atorvastatin (LIPITOR) 80 MG tablet TAKE 1 TABLET BY MOUTH EVERY DAY   clopidogrel (PLAVIX) 75 MG tablet TAKE 1 TABLET BY MOUTH EVERY DAY   ELIQUIS 5 MG TABS tablet Take 1 tablet (5 mg total) by mouth 2 (two) times daily.   escitalopram (LEXAPRO) 10 MG tablet Take 10 mg by mouth daily.  losartan (COZAAR) 50 MG tablet TAKE 1 TABLET BY MOUTH EVERY DAY   metoprolol succinate (TOPROL-XL) 50 MG 24 hr tablet TAKE 1 TABLET BY MOUTH EVERY DAY WITH OR IMMEDIATELY FOLLOWING A MEAL (Patient taking differently: Take 50 mg by mouth daily.)   nitroGLYCERIN (NITROSTAT) 0.4 MG SL tablet Place 1 tablet (0.4 mg total) under the tongue every 5 (five) minutes as needed for chest pain.   pantoprazole (PROTONIX) 40 MG tablet TAKE ONE TABLET BY MOUTH ONE TIME DAILY. must make follow up appointment for further refills (Patient taking differently: Take 40 mg by mouth daily.)   potassium chloride SA (KLOR-CON M) 20 MEQ tablet Take 20 mEq by mouth daily as needed (when taking furosemide).   torsemide (DEMADEX) 20 MG tablet Take 20 mg by mouth 2 (two) times daily as needed.   Current Facility-Administered Medications for the 02/28/23 encounter (Office Visit) with Alver Sorrow, NP  Medication   sodium chloride flush (NS) 0.9 % injection 3 mL    Review of Systems      All other systems reviewed and are otherwise negative except as noted above.  Physical Exam    VS:  BP 124/60   Pulse 60   Ht 5\' 10"  (1.778 m)   Wt 281 lb 1.6 oz (127.5 kg)   BMI 40.33 kg/m  , BMI Body mass index is 40.33 kg/m.  Wt  Readings from Last 3 Encounters:  02/28/23 281 lb 1.6 oz (127.5 kg)  02/14/23 268 lb (121.6 kg)  01/11/23 271 lb (122.9 kg)     GEN: Well nourished, overweight, well developed, in no acute distress. HEENT: normal. Neck: Supple, no JVD, carotid bruits, or masses. Cardiac: RRR, no murmurs, rubs, or gallops. No clubbing, cyanosis, edema.  Radials/PT 2+ and equal bilaterally.  Respiratory:  Respirations regular and unlabored, clear to auscultation bilaterally. GI: Soft, nontender, nondistended. MS: No deformity or atrophy. Skin: Warm and dry, no rash. Neuro:  Strength and sensation are intact. Psych: Normal affect.  Assessment & Plan    CAD - Stable with no anginal symptoms. No indication for ischemic evaluation.  GDMT includes plavix, metoprolol, atorvastatin. Heart healthy diet and regular cardiovascular exercise encouraged.    HLD - 02/14/23 LDL 48, normal liver enzymes.  Continue atorvastatin 80 mg daily.  HFpEF - Euvolemic and well compensated on exam. Continue Torsemide PRN (taking once per week). Low sodium diet, fluid restriction <2L, and daily weights encouraged. Educated to contact our office for weight gain of 2 lbs overnight or 5 lbs in one week.   PAF / Hypercoagulable state - Reports no palpitations. Previously unaware of atrial fibrillation.  Continue present dose metoprolol 50 mg daily.  Continue Eliquis 5 mg twice daily, does not meet dose reduction criteria.  Denies bleeding complications.. CHA2DS2-VASc Score = 7 [CHF History: 1, HTN History: 1, Diabetes History: 1, Stroke History: 2, Vascular Disease History: 1, Age Score: 1, Gender Score: 0].  Therefore, the patient's annual risk of stroke is 11.2 %.      Obesity - Weight loss via diet and exercise encouraged. Discussed the impact being overweight would have on cardiovascular risk. Given flyer about [employer] exercise program.   HTN - BP well controlled. Continue current antihypertensive regimen.  Discussed to monitor BP  at home at least 2 hours after medications and sitting for 5-10 minutes.          Disposition: Follow up in 6 month(s) with Chilton Si, MD or APP.  Signed, Alver Sorrow,  NP 02/28/2023, 8:21 AM [facility]

## 2023-02-28 NOTE — Patient Instructions (Addendum)
Medication Instructions:  Your physician recommends that you continue on your current medications as directed. Please refer to the Current Medication list given to you today.  *If you need a refill on your cardiac medications before your next appointment, please call your pharmacy*  Follow-Up: At [facility], you and your health needs are our priority.  As part of our continuing mission to provide you with exceptional heart care, we have created designated Provider Care Teams.  These Care Teams include your primary Cardiologist (physician) and Advanced Practice Providers (APPs -  Physician Assistants and Nurse Practitioners) who all work together to provide you with the care you need, when you need it.  We recommend signing up for the patient portal called "MyChart".  Sign up information is provided on this After Visit Summary.  MyChart is used to connect with patients for Virtual Visits (Telemedicine).  Patients are able to view lab/test results, encounter notes, upcoming appointments, etc.  Non-urgent messages can be sent to your provider as well.   To learn more about what you can do with MyChart, go to [email or web address].    Your next appointment:   6 month(s)  Provider:   Tiffany [place], MD or Caitlin Walker, NP    

## 2023-03-15 ENCOUNTER — Ambulatory Visit (INDEPENDENT_AMBULATORY_CARE_PROVIDER_SITE_OTHER): Payer: Medicare HMO | Admitting: Family Medicine

## 2023-03-16 ENCOUNTER — Other Ambulatory Visit (HOSPITAL_BASED_OUTPATIENT_CLINIC_OR_DEPARTMENT_OTHER): Payer: Self-pay | Admitting: Cardiovascular Disease

## 2023-03-16 NOTE — Telephone Encounter (Signed)
Rx request sent to pharmacy.  

## 2023-03-18 DIAGNOSIS — H2513 Age-related nuclear cataract, bilateral: Secondary | ICD-10-CM | POA: Diagnosis not present

## 2023-03-18 DIAGNOSIS — H52203 Unspecified astigmatism, bilateral: Secondary | ICD-10-CM | POA: Diagnosis not present

## 2023-03-18 DIAGNOSIS — H3500 Unspecified background retinopathy: Secondary | ICD-10-CM | POA: Diagnosis not present

## 2023-03-23 ENCOUNTER — Encounter: Payer: Self-pay | Admitting: Internal Medicine

## 2023-03-23 ENCOUNTER — Ambulatory Visit: Payer: Medicare HMO | Admitting: Internal Medicine

## 2023-03-23 ENCOUNTER — Other Ambulatory Visit (INDEPENDENT_AMBULATORY_CARE_PROVIDER_SITE_OTHER): Payer: Medicare HMO

## 2023-03-23 VITALS — BP 118/52 | HR 68 | Ht 69.0 in | Wt 279.4 lb

## 2023-03-23 DIAGNOSIS — R932 Abnormal findings on diagnostic imaging of liver and biliary tract: Secondary | ICD-10-CM

## 2023-03-23 DIAGNOSIS — K760 Fatty (change of) liver, not elsewhere classified: Secondary | ICD-10-CM

## 2023-03-23 DIAGNOSIS — K317 Polyp of stomach and duodenum: Secondary | ICD-10-CM | POA: Diagnosis not present

## 2023-03-23 DIAGNOSIS — K429 Umbilical hernia without obstruction or gangrene: Secondary | ICD-10-CM | POA: Diagnosis not present

## 2023-03-23 DIAGNOSIS — E88819 Insulin resistance, unspecified: Secondary | ICD-10-CM | POA: Diagnosis not present

## 2023-03-23 LAB — CBC WITH DIFFERENTIAL/PLATELET
Basophils Absolute: 0 10*3/uL (ref 0.0–0.1)
Basophils Relative: 0.7 % (ref 0.0–3.0)
Eosinophils Absolute: 0.2 10*3/uL (ref 0.0–0.7)
Eosinophils Relative: 4.5 % (ref 0.0–5.0)
HCT: 39.9 % (ref 39.0–52.0)
Hemoglobin: 13.1 g/dL (ref 13.0–17.0)
Lymphocytes Relative: 36.8 % (ref 12.0–46.0)
Lymphs Abs: 1.4 10*3/uL (ref 0.7–4.0)
MCHC: 32.9 g/dL (ref 30.0–36.0)
MCV: 95.9 fl (ref 78.0–100.0)
Monocytes Absolute: 0.4 10*3/uL (ref 0.1–1.0)
Monocytes Relative: 9.9 % (ref 3.0–12.0)
Neutro Abs: 1.8 10*3/uL (ref 1.4–7.7)
Neutrophils Relative %: 48.1 % (ref 43.0–77.0)
Platelets: 184 10*3/uL (ref 150.0–400.0)
RBC: 4.16 Mil/uL — ABNORMAL LOW (ref 4.22–5.81)
RDW: 16.9 % — ABNORMAL HIGH (ref 11.5–15.5)
WBC: 3.8 10*3/uL — ABNORMAL LOW (ref 4.0–10.5)

## 2023-03-23 LAB — PROTIME-INR
INR: 1.3 ratio — ABNORMAL HIGH (ref 0.8–1.0)
Prothrombin Time: 13.9 s — ABNORMAL HIGH (ref 9.6–13.1)

## 2023-03-23 LAB — COMPREHENSIVE METABOLIC PANEL
ALT: 28 U/L (ref 0–53)
AST: 26 U/L (ref 0–37)
Albumin: 4 g/dL (ref 3.5–5.2)
Alkaline Phosphatase: 66 U/L (ref 39–117)
BUN: 20 mg/dL (ref 6–23)
CO2: 24 mEq/L (ref 19–32)
Calcium: 9.3 mg/dL (ref 8.4–10.5)
Chloride: 106 mEq/L (ref 96–112)
Creatinine, Ser: 1.12 mg/dL (ref 0.40–1.50)
GFR: 66.25 mL/min (ref >60.00)
Glucose, Bld: 136 mg/dL — ABNORMAL HIGH (ref 70–99)
Potassium: 4.4 mEq/L (ref 3.5–5.1)
Sodium: 142 mEq/L (ref 135–145)
Total Bilirubin: 0.6 mg/dL (ref 0.2–1.2)
Total Protein: 7 g/dL (ref 6.0–8.3)

## 2023-03-23 NOTE — Progress Notes (Signed)
Devon Mathews. 72 y.o. [birth year] [id number]  Assessment & Plan:   Encounter Diagnoses  Name Primary?   Abnormal CT of liver Yes   NAFLD (nonalcoholic fatty liver disease)    Umbilical hernia without obstruction and without gangrene    Insulin resistance    Severe obesity (BMI >= 40) (HCC)    No clear evidence of cirrhosis - lab and [place] f/u  He needs to reduce CHO - start w/ bread and sodas - weight loss   CCS referral re: umbilical hernia - I explained that given health status and especially obesity they may decline to operate and recommend weight loss  Recent colonoscopy so no need to invesitigate perceived change in stools - observe  Orders Placed This Encounter  Procedures   US ABDOMEN COMPLETE W/ELASTOGRAPHY   CBC with Differential/Platelet   Comprehensive metabolic panel   Protime-INR    NW:[id number], Adelfa Koh, MD  Subjective:   Chief Complaint: f/u abnl CT ? cirrhosis  HPI 72 yo wm w// obesity, irregular liver contour on prior CT, hx adenomatous colon polyps, hyperplastic gastric polyps w/ bleeding, DM and insulin resistance, CHF and CAD here for f/u. Last seen 08/2022. He had een reducing insulin levels and losing weight but has stopped going to bariatric clinic and is eating sig bread and drinking sugary sodas again.Random insulin 41.7 02/14/23 and was 28 10/05/22.  EGD 03/01/22 Impression:               - Multiple gastric polyps. Ligated x 2 - both                            friable polyps w/ stigmata of bleeding 4 mm and 8                            mm. Previously seen and biopsied (hyperplastic).                            Also has fundic gland polyps w/o bleeding stigmata                            - no therapy needed.                           - The examination was otherwise normal.                           - No specimens collected.  COLONOSCOPY 12/04/21  TI erythema bx - non-specific ileitis 2 diminutive polyps colon adenomas O/w NL Wt Readings from  Last 3 Encounters:  03/23/23 279 lb 6 oz (126.7 kg)  02/28/23 281 lb 1.6 oz (127.5 kg)  02/14/23 268 lb (121.6 kg)   08/2022 279# Allergies  Allergen Reactions   Penicillins Hives and Nausea And Vomiting    Did it involve swelling of the face/tongue/throat, SOB, or low BP? No Did it involve sudden or severe rash/hives, skin peeling, or any reaction on the inside of your mouth or nose? No Did you need to seek medical attention at a hospital or doctor's office? No When did it last happen?      30 + years If all above answers are "NO", may proceed with cephalosporin use.  Current Meds  Medication Sig   acetaminophen (TYLENOL) 500 MG tablet Take 500-1,000 mg by mouth every 6 (six) hours as needed for moderate pain.   amitriptyline (ELAVIL) 25 MG tablet Take 0.5 tablets (12.5 mg total) by mouth at bedtime as needed for sleep.   amLODipine (NORVASC) 2.5 MG tablet Take 1 tablet (2.5 mg total) by mouth daily.   atorvastatin (LIPITOR) 80 MG tablet TAKE 1 TABLET BY MOUTH EVERY DAY   clopidogrel (PLAVIX) 75 MG tablet TAKE 1 TABLET BY MOUTH EVERY DAY   ELIQUIS 5 MG TABS tablet Take 1 tablet (5 mg total) by mouth 2 (two) times daily.   escitalopram (LEXAPRO) 10 MG tablet Take 10 mg by mouth daily.   losartan (COZAAR) 50 MG tablet TAKE 1 TABLET BY MOUTH EVERY DAY   methocarbamol (ROBAXIN) 500 MG tablet Take 500 mg by mouth 3 (three) times daily as needed.   metoprolol succinate (TOPROL-XL) 50 MG 24 hr tablet TAKE 1 TABLET BY MOUTH EVERY DAY WITH OR IMMEDIATELY FOLLOWING A MEAL (Patient taking differently: Take 50 mg by mouth daily.)   pantoprazole (PROTONIX) 40 MG tablet TAKE ONE TABLET BY MOUTH ONE TIME DAILY. must make follow up appointment for further refills (Patient taking differently: Take 40 mg by mouth daily.)   potassium chloride SA (KLOR-CON M) 20 MEQ tablet Take 20 mEq by mouth daily as needed (when taking furosemide).   torsemide (DEMADEX) 20 MG tablet Take 20 mg by mouth. Every 3 days    Current Facility-Administered Medications for the 03/23/23 encounter (Office Visit) with Iva Boop, MD  Medication   sodium chloride flush (NS) 0.9 % injection 3 mL   Past Medical History:  Diagnosis Date   Anxiety    Atrial fibrillation (HCC)    CAD S/P percutaneous coronary angioplasty 09/13/2018    99% p-mLAD (BTW d1&d2) -> SYNERGY DES 3.5 X 12 (3.75 mm). 20% LM.  Cx, small RI & co-dom RCA normal.  EF 65%.    Chronic diastolic heart failure (HCC) 11/30/2019   Congestive heart failure (CHF) (HCC)    Diabetes mellitus without complication (HCC)    Dizziness 2012   normal findings..   Essential hypertension 07/28/2016   GERD (gastroesophageal reflux disease)    Hyperlipidemia    Lower extremity edema 07/28/2016   NAFLD (nonalcoholic fatty liver disease)    NSTEMI (non-ST elevated myocardial infarction) (HCC) 09/13/2018   The patient presented 09/13/2018 with a non-ST elevation MI, troponin peak was 0.15   Obesity    Obesity (BMI 30-39.9) 07/28/2016   OSA (obstructive sleep apnea) 07/28/2016   Restless leg syndrome    mild   SCCA (squamous cell carcinoma) of skin 08/24/2021   Left Forehead (in situ-inflitrating)   Shortness of breath 07/28/2016   Sleep apnea    Squamous cell carcinoma of skin 03/20/2015   right ant scalp(curetx3, 57fu)   Squamous cell carcinoma of skin 01/01/2016   left lateral forehead (curetx3, 24fu)   Squamous cell carcinoma of skin 06/15/2019   left mid anterior scalp (tx afte bx)   Squamous cell carcinoma of skin 01/08/2021   well diff- left forehead   Squamous cell carcinoma of skin 01/08/2021   in situ- mid frontal scalp   TIA (transient ischemic attack)    Tubular adenoma    Past Surgical History:  Procedure Laterality Date   COLONOSCOPY     CORONARY BALLOON ANGIOPLASTY N/A 12/05/2018   Procedure: CORONARY BALLOON ANGIOPLASTY;  Surgeon: Marykay Lex, MD;  Location:  [facility];  Service: Cardiovascular;  Laterality: N/A;    CORONARY STENT INTERVENTION N/A 09/13/2018   Procedure: CORONARY STENT INTERVENTION;  Surgeon: Lyn Records, MD;  Location: [facility];  Service: Cardiovascular;; p-m LAD (btw D1&D2) - SYNERGY DES 3.5 x 12 (3.75 mm).   ESOPHAGOGASTRODUODENOSCOPY (EGD) WITH PROPOFOL N/A 03/01/2022   Procedure: ESOPHAGOGASTRODUODENOSCOPY (EGD) WITH PROPOFOL;  Surgeon: Iva Boop, MD;  Location: [facility];  Service: Gastroenterology;  Laterality: N/A;  Polypectomy   GASTRIC VARICES BANDING  03/01/2022   Procedure: GASTRIC  BANDING;  Surgeon: Iva Boop, MD;  Location: [facility];  Service: Gastroenterology;;   KNEE ARTHROSCOPY Right    LEFT HEART CATH AND CORONARY ANGIOGRAPHY N/A 09/13/2018   Procedure: LEFT HEART CATH AND CORONARY ANGIOGRAPHY;  Surgeon: Lyn Records, MD;  Location: [facility];  Service: Cardiovascular;;  99% p-mLAD (BTW d1&d2) -> SYNERGY DES 3.5 X 12 (3.75 mm). 20% LM.  Cx, small RI & co-dom RCA normal.  EF 65%.    LEFT HEART CATH AND CORONARY ANGIOGRAPHY  2003   30-40% proximal segmental stenosis in the LAD   LEFT HEART CATH AND CORONARY ANGIOGRAPHY  2006   EF >50% & no mitral regurgitation   LEFT HEART CATH AND CORONARY ANGIOGRAPHY N/A 12/05/2018   Procedure: LEFT HEART CATH AND CORONARY ANGIOGRAPHY;  Surgeon: Marykay Lex, MD;  Location: [facility] INVASIVE CV LAB;  Service: Cardiovascular;  Laterality: N/A;   LEFT HEART CATH AND CORONARY ANGIOGRAPHY N/A 08/17/2022   Procedure: LEFT HEART CATH AND CORONARY ANGIOGRAPHY;  Surgeon: Tonny Bollman, MD;  Location: [facility] INVASIVE CV LAB;  Service: Cardiovascular;  Laterality: N/A;   NM MYOVIEW LTD  07/2016   Normal.  EF 55-65% 63%).  No ischemia or infarction.  LOW RISK   NM MYOVIEW LTD  11/2018   6: 34 min.  7.2 METS.  Noted chest pain and tightness.  Horizontal ST of T wave depression (2 mm) II, 3, V5 and V6 (HIGH RISK), large/severe defect basal-apical inferior, inferolateral wall.  Large-severe basal-apical  anterior-anterolateral wall.  Medium/moderate defect in basal and mid inferoseptal wall.  Consistent with large prior infarct with peri-infarct ischemia.  HIGH RISK   PERICARDIOCENTESIS N/A 12/05/2018   Procedure: PERICARDIOCENTESIS;  Surgeon: Marykay Lex, MD;  Location: [facility] INV[facility]IVE CV LAB;  Service: Cardiovascular;  Laterality: N/A;   SUBXYPHOID PERICARDIAL WINDOW N/A 12/25/2018   Procedure: SUBXYPHOID PERICARDIAL WINDOW;  Surgeon: Alleen Borne, MD;  Location: [facility] OR;  Service: Thoracic;  Laterality: N/A;   TEE WITHOUT CARDIOVERSION N/A 12/25/2018   Procedure: TRANSESOPHAGEAL ECHOCARDIOGRAM (TEE);  Surgeon: Alleen Borne, MD;  Location: [facility] OR;  Service: Thoracic;  Laterality: N/A;   tendon detachment Right    arm   TRANSTHORACIC ECHOCARDIOGRAM  07/2016   EF 60-65% with mild LVH.  Mild AI.  Trivial MR and TR.   VASECTOMY     Social History   Social History Narrative   Patient is married with 2 sons and retired   Never smoker no alcohol or tobacco now and no caffeine now   family history includes COPD in his father; Diabetes in his brother, mother, and sister; Heart attack in his paternal grandmother and sister; Heart disease in his father and mother; Heart failure in his father, mother, and sister; Hypertension in his mother; Other in his father and maternal grandfather; Pancreatic cancer in his paternal aunt and paternal uncle; Stroke in his father.   Review of Systems  Objective:   Physical Exam @BP  (!) 118/52 (BP Location: Left Arm, Patient Position: Sitting, Cuff Size: Large)   Pulse 68   Ht 5\' 9"  (1.753 m)   Wt 279 lb 6 oz (126.7 kg)   BMI 41.26 kg/m @  General:  NAD - obese Eyes:   anicteric Lungs:  clear Heart::  S1S2 no rubs, murmurs or gallops Abdomen:  soft and nontender, BS+ - 5 cm soft umbilical hernia + smaller epigastric hernia vs diastasis recti Ext:   no edema, cyanosis or clubbing    Data Reviewed:  See HPI

## 2023-03-23 NOTE — Patient Instructions (Addendum)
Your provider has requested that you go to the basement level for lab work before leaving today. Press "B" on the elevator. The lab is located at the first door on the left as you exit the elevator.  Try to reduce carbohydrates, stop bread and soda.  You have been scheduled for an appointment with __________________ at [facility]. Your appointment is on _________________ at __________________. Please arrive at ______________ for registration. Make certain to bring a list of current medications, including any over the counter medications or vitamins. Also bring your co-pay if you have one as well as your insurance cards. [facility] is located at [address]. Should you need to reschedule your appointment, please contact them at [phone].   You have been scheduled for an abdominal ultrasound at [facility] (1st floor of hospital) on 03/30/2023 at 7:00AM. Please arrive 15 minutes prior to your appointment for registration. Make certain not to have anything to eat or drink 6 hours prior to your appointment. Should you need to reschedule your appointment, please contact radiology at [phone]. This test typically takes about 30 minutes to perform.   I appreciate the opportunity to care for you. Stan Head, MD, [facility]

## 2023-03-30 ENCOUNTER — Ambulatory Visit (HOSPITAL_COMMUNITY)
Admission: RE | Admit: 2023-03-30 | Discharge: 2023-03-30 | Disposition: A | Discharge status: Home / Self Care | Payer: Medicare HMO | Source: Ambulatory Visit | Attending: Internal Medicine | Admitting: Internal Medicine

## 2023-03-30 DIAGNOSIS — K760 Fatty (change of) liver, not elsewhere classified: Secondary | ICD-10-CM | POA: Insufficient documentation

## 2023-03-30 DIAGNOSIS — R932 Abnormal findings on diagnostic imaging of liver and biliary tract: Secondary | ICD-10-CM | POA: Diagnosis not present

## 2023-04-01 DIAGNOSIS — Z0100 Encounter for examination of eyes and vision without abnormal findings: Secondary | ICD-10-CM | POA: Diagnosis not present

## 2023-05-04 DIAGNOSIS — K429 Umbilical hernia without obstruction or gangrene: Secondary | ICD-10-CM | POA: Diagnosis not present

## 2023-05-04 DIAGNOSIS — Z6841 Body Mass Index (BMI) 40.0 and over, adult: Secondary | ICD-10-CM | POA: Diagnosis not present

## 2023-05-04 DIAGNOSIS — K432 Incisional hernia without obstruction or gangrene: Secondary | ICD-10-CM | POA: Diagnosis not present

## 2023-05-09 DIAGNOSIS — G4733 Obstructive sleep apnea (adult) (pediatric): Secondary | ICD-10-CM | POA: Diagnosis not present

## 2023-05-13 ENCOUNTER — Other Ambulatory Visit (INDEPENDENT_AMBULATORY_CARE_PROVIDER_SITE_OTHER): Payer: Self-pay | Admitting: Family Medicine

## 2023-05-13 DIAGNOSIS — I509 Heart failure, unspecified: Secondary | ICD-10-CM | POA: Diagnosis not present

## 2023-05-13 DIAGNOSIS — Z1212 Encounter for screening for malignant neoplasm of rectum: Secondary | ICD-10-CM | POA: Diagnosis not present

## 2023-05-13 DIAGNOSIS — I2510 Atherosclerotic heart disease of native coronary artery without angina pectoris: Secondary | ICD-10-CM | POA: Diagnosis not present

## 2023-05-13 DIAGNOSIS — Z7901 Long term (current) use of anticoagulants: Secondary | ICD-10-CM

## 2023-05-13 DIAGNOSIS — Z125 Encounter for screening for malignant neoplasm of prostate: Secondary | ICD-10-CM | POA: Diagnosis not present

## 2023-05-13 DIAGNOSIS — E785 Hyperlipidemia, unspecified: Secondary | ICD-10-CM | POA: Diagnosis not present

## 2023-05-13 DIAGNOSIS — R7301 Impaired fasting glucose: Secondary | ICD-10-CM | POA: Diagnosis not present

## 2023-05-13 DIAGNOSIS — I110 Hypertensive heart disease with heart failure: Secondary | ICD-10-CM | POA: Diagnosis not present

## 2023-05-13 DIAGNOSIS — M109 Gout, unspecified: Secondary | ICD-10-CM | POA: Diagnosis not present

## 2023-05-13 DIAGNOSIS — E7800 Pure hypercholesterolemia, unspecified: Secondary | ICD-10-CM | POA: Diagnosis not present

## 2023-05-18 ENCOUNTER — Other Ambulatory Visit (HOSPITAL_BASED_OUTPATIENT_CLINIC_OR_DEPARTMENT_OTHER): Payer: Self-pay | Admitting: Cardiovascular Disease

## 2023-05-18 NOTE — Telephone Encounter (Signed)
Rx request sent to pharmacy.  

## 2023-05-19 DIAGNOSIS — M25571 Pain in right ankle and joints of right foot: Secondary | ICD-10-CM | POA: Diagnosis not present

## 2023-05-20 DIAGNOSIS — R82998 Other abnormal findings in urine: Secondary | ICD-10-CM | POA: Diagnosis not present

## 2023-05-20 DIAGNOSIS — I2510 Atherosclerotic heart disease of native coronary artery without angina pectoris: Secondary | ICD-10-CM | POA: Diagnosis not present

## 2023-05-20 DIAGNOSIS — I480 Paroxysmal atrial fibrillation: Secondary | ICD-10-CM | POA: Diagnosis not present

## 2023-05-20 DIAGNOSIS — Z6841 Body Mass Index (BMI) 40.0 and over, adult: Secondary | ICD-10-CM | POA: Diagnosis not present

## 2023-05-20 DIAGNOSIS — Z0000 Encounter for general adult medical examination without abnormal findings: Secondary | ICD-10-CM | POA: Diagnosis not present

## 2023-05-20 DIAGNOSIS — Z7901 Long term (current) use of anticoagulants: Secondary | ICD-10-CM | POA: Diagnosis not present

## 2023-05-20 DIAGNOSIS — I110 Hypertensive heart disease with heart failure: Secondary | ICD-10-CM | POA: Diagnosis not present

## 2023-05-20 DIAGNOSIS — D126 Benign neoplasm of colon, unspecified: Secondary | ICD-10-CM | POA: Diagnosis not present

## 2023-05-20 DIAGNOSIS — R413 Other amnesia: Secondary | ICD-10-CM | POA: Diagnosis not present

## 2023-05-20 DIAGNOSIS — Z1339 Encounter for screening examination for other mental health and behavioral disorders: Secondary | ICD-10-CM | POA: Diagnosis not present

## 2023-05-20 DIAGNOSIS — I509 Heart failure, unspecified: Secondary | ICD-10-CM | POA: Diagnosis not present

## 2023-05-20 DIAGNOSIS — Z1331 Encounter for screening for depression: Secondary | ICD-10-CM | POA: Diagnosis not present

## 2023-06-03 DIAGNOSIS — S93421A Sprain of deltoid ligament of right ankle, initial encounter: Secondary | ICD-10-CM | POA: Diagnosis not present

## 2023-06-04 DIAGNOSIS — M25571 Pain in right ankle and joints of right foot: Secondary | ICD-10-CM | POA: Diagnosis not present

## 2023-06-17 DIAGNOSIS — S93421A Sprain of deltoid ligament of right ankle, initial encounter: Secondary | ICD-10-CM | POA: Diagnosis not present

## 2023-07-04 DIAGNOSIS — M25571 Pain in right ankle and joints of right foot: Secondary | ICD-10-CM | POA: Diagnosis not present

## 2023-07-04 DIAGNOSIS — M25671 Stiffness of right ankle, not elsewhere classified: Secondary | ICD-10-CM | POA: Diagnosis not present

## 2023-08-02 DIAGNOSIS — M25671 Stiffness of right ankle, not elsewhere classified: Secondary | ICD-10-CM | POA: Diagnosis not present

## 2023-08-02 DIAGNOSIS — M25571 Pain in right ankle and joints of right foot: Secondary | ICD-10-CM | POA: Diagnosis not present

## 2023-08-16 DIAGNOSIS — M25511 Pain in right shoulder: Secondary | ICD-10-CM | POA: Diagnosis not present

## 2023-08-16 DIAGNOSIS — M25512 Pain in left shoulder: Secondary | ICD-10-CM | POA: Diagnosis not present

## 2023-09-01 ENCOUNTER — Ambulatory Visit (HOSPITAL_BASED_OUTPATIENT_CLINIC_OR_DEPARTMENT_OTHER): Payer: Medicare HMO | Admitting: Cardiovascular Disease

## 2023-09-09 ENCOUNTER — Other Ambulatory Visit (HOSPITAL_BASED_OUTPATIENT_CLINIC_OR_DEPARTMENT_OTHER): Payer: Self-pay | Admitting: Cardiovascular Disease

## 2023-10-18 ENCOUNTER — Other Ambulatory Visit (HOSPITAL_BASED_OUTPATIENT_CLINIC_OR_DEPARTMENT_OTHER): Payer: Self-pay | Admitting: Cardiovascular Disease

## 2023-12-01 DIAGNOSIS — R0989 Other specified symptoms and signs involving the circulatory and respiratory systems: Secondary | ICD-10-CM | POA: Diagnosis not present

## 2023-12-01 DIAGNOSIS — G4733 Obstructive sleep apnea (adult) (pediatric): Secondary | ICD-10-CM | POA: Diagnosis not present

## 2023-12-01 DIAGNOSIS — R7301 Impaired fasting glucose: Secondary | ICD-10-CM | POA: Diagnosis not present

## 2023-12-01 DIAGNOSIS — Z6841 Body Mass Index (BMI) 40.0 and over, adult: Secondary | ICD-10-CM | POA: Diagnosis not present

## 2023-12-01 DIAGNOSIS — M1990 Unspecified osteoarthritis, unspecified site: Secondary | ICD-10-CM | POA: Diagnosis not present

## 2023-12-01 DIAGNOSIS — I2510 Atherosclerotic heart disease of native coronary artery without angina pectoris: Secondary | ICD-10-CM | POA: Diagnosis not present

## 2023-12-01 DIAGNOSIS — Z7901 Long term (current) use of anticoagulants: Secondary | ICD-10-CM | POA: Diagnosis not present

## 2023-12-01 DIAGNOSIS — I480 Paroxysmal atrial fibrillation: Secondary | ICD-10-CM | POA: Diagnosis not present

## 2023-12-01 DIAGNOSIS — I509 Heart failure, unspecified: Secondary | ICD-10-CM | POA: Diagnosis not present

## 2023-12-01 DIAGNOSIS — I110 Hypertensive heart disease with heart failure: Secondary | ICD-10-CM | POA: Diagnosis not present

## 2023-12-01 DIAGNOSIS — F5104 Psychophysiologic insomnia: Secondary | ICD-10-CM | POA: Diagnosis not present

## 2023-12-10 DIAGNOSIS — G4733 Obstructive sleep apnea (adult) (pediatric): Secondary | ICD-10-CM | POA: Diagnosis not present

## 2023-12-29 ENCOUNTER — Telehealth: Payer: Self-pay | Admitting: Cardiovascular Disease

## 2023-12-29 NOTE — Telephone Encounter (Signed)
 Pt c/o of Chest Pain: STAT if active (IN THIS MOMENT) CP, including tightness, pressure, jaw pain, shoulder/upper arm/back pain, SOB, nausea, and vomiting.  1. Are you having CP right now (tightness, pressure, or discomfort)? Yes, pressure in chest now and yesterday it was bad and felt sharp pain like a stabbing feeling.  2. Are you experiencing any other symptoms (ex. SOB, nausea, vomiting, sweating)? No but nausea yesterday and sometimes SOB.   3. How long have you been experiencing CP? Started yesterday afternoon around 4:30pm  4. Is your CP continuous or coming and going? Coming and going   5. Have you taken Nitroglycerin? No  6. If CP returns before callback, please consider calling 911. ?

## 2023-12-29 NOTE — Telephone Encounter (Signed)
 Transferred call from call center as a stat call.    Patient states he had some chest pain yesterday afternoon around 1630. It felt like a sharp stabbing pain in the upper right quadrant. He went to dinner he was nauseated and noted it felt like pressure on his chest. He laid down, was restless, couldn't sleep, took some Rolaids which helped the nausea. Has had some pressure today but not to the severity of yesterday. He notes extreme shortness of breath with even a little as 5 to 10 steps on flat ground.   He does not have his nitroglycerin, he left it in his pants last week and it got washed. He also forgot to bring his fluid pill with him, has not had until Sunday. Has not weighted.   2-3 weeks ago he had bronchitis, took a zpack which helped.   He is coming home tonight due to the cough to be seen again in the morning. Advised patient that chest pain could be from excess coughing and fluid accumulation. Advised if Chest pain returns to be seen in ED. If not go to PCP appointment in the morning for follow up.

## 2023-12-30 DIAGNOSIS — I480 Paroxysmal atrial fibrillation: Secondary | ICD-10-CM | POA: Diagnosis not present

## 2023-12-30 DIAGNOSIS — G4733 Obstructive sleep apnea (adult) (pediatric): Secondary | ICD-10-CM | POA: Diagnosis not present

## 2023-12-30 DIAGNOSIS — Z7901 Long term (current) use of anticoagulants: Secondary | ICD-10-CM | POA: Diagnosis not present

## 2023-12-30 DIAGNOSIS — R0789 Other chest pain: Secondary | ICD-10-CM | POA: Diagnosis not present

## 2023-12-30 DIAGNOSIS — I509 Heart failure, unspecified: Secondary | ICD-10-CM | POA: Diagnosis not present

## 2023-12-30 DIAGNOSIS — I119 Hypertensive heart disease without heart failure: Secondary | ICD-10-CM | POA: Diagnosis not present

## 2023-12-30 DIAGNOSIS — R051 Acute cough: Secondary | ICD-10-CM | POA: Diagnosis not present

## 2023-12-30 DIAGNOSIS — K219 Gastro-esophageal reflux disease without esophagitis: Secondary | ICD-10-CM | POA: Diagnosis not present

## 2023-12-30 DIAGNOSIS — R0989 Other specified symptoms and signs involving the circulatory and respiratory systems: Secondary | ICD-10-CM | POA: Diagnosis not present

## 2023-12-30 DIAGNOSIS — R0600 Dyspnea, unspecified: Secondary | ICD-10-CM | POA: Diagnosis not present

## 2024-01-02 ENCOUNTER — Ambulatory Visit: Payer: Self-pay | Attending: Physician Assistant | Admitting: Physician Assistant

## 2024-01-02 ENCOUNTER — Encounter: Payer: Self-pay | Admitting: Physician Assistant

## 2024-01-02 VITALS — BP 128/66 | HR 72 | Ht 69.0 in | Wt 284.8 lb

## 2024-01-02 DIAGNOSIS — R079 Chest pain, unspecified: Secondary | ICD-10-CM | POA: Diagnosis not present

## 2024-01-02 DIAGNOSIS — I5032 Chronic diastolic (congestive) heart failure: Secondary | ICD-10-CM | POA: Diagnosis not present

## 2024-01-02 DIAGNOSIS — E785 Hyperlipidemia, unspecified: Secondary | ICD-10-CM | POA: Diagnosis not present

## 2024-01-02 DIAGNOSIS — I480 Paroxysmal atrial fibrillation: Secondary | ICD-10-CM | POA: Diagnosis not present

## 2024-01-02 DIAGNOSIS — I10 Essential (primary) hypertension: Secondary | ICD-10-CM

## 2024-01-02 DIAGNOSIS — I25119 Atherosclerotic heart disease of native coronary artery with unspecified angina pectoris: Secondary | ICD-10-CM | POA: Diagnosis not present

## 2024-01-02 MED ORDER — NITROGLYCERIN 0.4 MG SL SUBL: 0.4000 mg | SUBLINGUAL_TABLET | SUBLINGUAL | 3 refills | Status: DC | PRN

## 2024-01-02 NOTE — Progress Notes (Signed)
 Cardiology Office Note:  .   Date:  01/02/2024  ID:  Devon Mathews., DOB 1951-07-01, MRN [id number] PCP: Gaspar Garbe, MD  [facility] Providers Cardiologist:  Chilton Si, MD     History of Present Illness: .   Devon Mathews. is a 73 y.o. male with PMH of CAD, PAF, chronic diastolic heart failure, OSA on CPAP, hypertension, hyperlipidemia, DM2 and prior TIA.  He had mild disease on previous cardiac cath in 2000.  A subsequent Myoview's were all low risk.  Unfortunately he ended up going to the hospital in November 2019 with NSTEMI.  Patient had DES to mid LAD in 2019 and had a 95% jailed diagonal due to previously placed stent.  Last echocardiogram obtained in June 2022 showed EF of 60 to 65%, no regional wall motion abnormality, RVSP 24.5 mmHg, trivial MR, trivial AI.  He was seen in October 2023 by Dr. Duke Salvia at which time he had chest pain in the setting of high stress.  Troponin was negative.  He underwent repeat cardiac catheterization revealing 30% ostial D1 lesion, patent LAD stent, continued patency of the diagonal branch.  He was last seen in May 2014 at which time he was doing well.  He contacted cardiology service on 12/29/2023 due to chest discomfort.  Patient presented today for follow-up.  He had sudden onset of jabbing right-sided chest shooting pain lasted for a second last Wednesday.  Afterward, he had nausea and will went to sleep.  However he woke up, nausea resolved.  However he had a residual left-sided chest pressure for the next 24 hours.  EKG obtained at PCPs office showed normal sinus rhythm, no significant ST-T wave changes.  EKG obtained today showed T wave inversion in the anterior leads.  Since the last week's symptom, he did have one more episode of chest pressure however this did not last very long.  His symptoms did not occur with physical activity.  His breathing has improved with prednisone.  He has as needed dose of torsemide and did take a torsemide  in the past few days.  On physical exam, he appears to be euvolemic.  Given prior history and recent symptom, I will obtain echocardiogram and a PET stress test.  I will see the patient back in 6 weeks.  If echo and PET stress test are normal, no additional workup is recommended.  ROS:   Patient had 2 episode of chest pain this past week.  He has no lower extremity edema, orthopnea or PND.   Studies Reviewed: Marland Kitchen   EKG Interpretation Date/Time:  Monday January 02 2024 09:45:40 EDT Ventricular Rate:  72 PR Interval:  206 QRS Duration:  98 QT Interval:  416 QTC Calculation: 455 R Axis:   35  Text Interpretation: Normal sinus rhythm  T wave inversion in lead V1-V3 Confirmed by Devon Mathews [phone] on 01/02/2024 8:12:36 PM    Cardiac Studies & Procedures   ______________________________________________________________________________________________ CARDIAC CATHETERIZATION  CARDIAC CATHETERIZATION 08/17/2022  Narrative   Ost 1st Diag lesion is 30% stenosed.  1.  Patent left main with no obstructive disease 2.  Patent LAD, including the stented segment in the proximal vessel, with continued patency of the diagonal branch that was intervened upon in 2020 3.  Patent circumflex with mild to moderate nonobstructive stenosis in the mid vessel after the first OM branch, does not appear flow-limiting 4.  Patent RCA without significant stenosis 5.  Normal LVEDP  Recommendations: Ongoing medical therapy  for nonobstructive coronary artery disease  Findings Coronary Findings Diagnostic  Dominance: Right  Left Anterior Descending Prox LAD lesion is 30% stenosed. The lesion was previously treated . Mid LAD lesion is 30% stenosed.  First Diagonal Branch The diagonal branch, previously intervened upon, and is now occluded at its origin with no collateral flow present to visualize this vessel. Ost 1st Diag lesion is 30% stenosed.  Left Circumflex Prox Cx lesion is 50% stenosed.  Right Coronary  Artery The vessel exhibits minimal luminal irregularities. Dominant vessel, minimal irregularity with no obstructive disease.  Supplies a PDA and PLA branch.  Right Posterior Descending Artery The vessel exhibits minimal luminal irregularities.  Intervention  No interventions have been documented.   CARDIAC CATHETERIZATION  CARDIAC CATHETERIZATION 12/05/2018  Narrative  Previously placed Prox LAD drug eluting stent is widely patent.  Ost 1st Diag lesion is 95% stenosed.  Scoring balloon angioplasty was performed using a BALLOON WOLVERINE 2.50X10.  Post intervention, there is a 20% residual stenosis.  The left ventricular systolic function is normal. The left ventricular ejection fraction is 55-65% by visual estimate.  Successful occlusion of distal diagonal branch with prolonged balloon inflations and protamine infusion after initial wire related perforation of the distal diagonal branch.  1st Diag lesion is 100% stenosed.  SUMMARY  Culprit lesion:  90% ostial 1st Diag (jailed) -successful scoring balloon angioplasty using 2.5 mm Wolverine scoring balloon  --Distal diagonal wire perforation with pericardial effusion and initial stages of pericardial tamponade --> successful occlusion with prolonged balloon inflations and administration of protamine  --Unsuccessful pericardiocentesis  Widely patent LAD stent  Otherwise stable coronary arteries.  Normal LVEF and EDP pre-PCI  Clear ST elevations for roughly 15 minutes, most likely type for MI.   RECOMMENDATIONS  Admit to CCU on Levophed --continue Levophed overnight  Monitor closely for signs of tamponade.  Follow troponins for likely type IV MI  Stat echocardiogram ordered.  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  Dr. Laneta Simmers from CT surgery is aware the patient & current  condition  Findings Coronary Findings Diagnostic  Dominance: Right  Left Anterior Descending Previously placed Prox LAD drug eluting stent is widely patent.  First Diagonal Branch Ost 1st Diag lesion is 95% stenosed. The lesion is located at the bifurcation and eccentric. Jailed 1st Diag lesion is 100% stenosed. Distal diagonal branch occlusion after prolonged balloon angioplasty.  This was in response to wire perforation.  Intervention  Ost 1st Diag lesion Angioplasty Lesion length:  10 mm. CATH VISTA GUIDE 6FR XBLAD3.5 guide catheter was inserted. WIRE SION BLACK 190 guidewire used to cross lesion. Scoring balloon angioplasty was performed using a BALLOON WOLVERINE 2.50X10. Maximum pressure: 12 atm. Inflation time: 60 sec. WIRE SION Blue used for LAD protection Post-Intervention Lesion Assessment The intervention was successful. Pre-interventional TIMI flow is 3. Post-intervention TIMI flow is 3. Treated lesion length:  10 mm. At this lesion, a perforation of the vessel occurred. -Unfortunately, the side on black wire advanced too far distally with scoring balloon removal and this led to distal diagonal wire perforation. With post PTCA angiography revealing distal perforation, I used the 2.0 mm balloon that initially was used for predilation for prolonged inflations in the distal diagonal branch.  Initial attempts seem to be successful, however there was still blush.  The patient then became hypotensive with concerns for possible tamponade physiology.  He  was started on IV Levophed and given a 1000 mL bolus. The balloon was then reinserted with now prolonged inflations of 15, 15 and 20 minutes held.  After first prolonged inflation, there was still some blush, therefore protamine was administered.  After the second do, no further blush was noted.  Of note, the patient did become quite symptomatic with the dropping of his blood pressure and then began to have significant chest pain with a  prolonged balloon inflation on the second occasion. There is a 20% residual stenosis post intervention.   STRESS TESTS  MYOCARDIAL PERFUSION IMAGING 12/01/2018  Narrative  Nuclear stress EF: 54%.  The left ventricular ejection fraction is mildly decreased (45-54%).  Horizontal ST segment depression ST segment depression of 2 mm was noted during stress in the II, III, V5 and V6 leads, beginning at 8 minutes of stressST deviation beginning in recovery.  Defect 1: There is a large defect of severe severity present in the basal inferior, basal inferolateral, mid inferior, mid inferolateral, apical inferior, apical lateral and apex location.  Defect 2: There is a large defect of severe severity present in the basal anterior, basal anterolateral, mid anterior, mid anterolateral, apical anterior and apex location.  Defect 3: There is a medium defect of moderate severity present in the basal inferoseptal and mid inferoseptal location.  Findings consistent with ischemia and prior myocardial infarction with peri-infarct ischemia.  This is a high risk study.  There is a medium size, severe perfusion defect in the inferior and inferolateral walls with stress that is partially reversible at rest, returning to moderate. Base to apex. There is a absent, large perfusion defect in the anterior wall from base to apex. It is partially reversible at rest and returns to moderate at the base and mid ventricle and returns to mild at the apex. Anterolateral defect is severe, and with full reversibility. Medium size, moderate perfusion defect in the inferoseptum with partial reversibility, returning to mild at rest.  ECG positive for ischemia in recovery.  Chest pain and shortness of breath with peak stress.  High risk study. Consider coronary angiography if clinically indicated.   ECHOCARDIOGRAM  ECHOCARDIOGRAM COMPLETE 03/27/2021  Narrative ECHOCARDIOGRAM REPORT    Patient Name:   Devon Mathews.  Date of Exam: 03/27/2021 Medical Rec #:  [id number]      Height:       70.0 in Accession #:    [id number]     Weight:       295.0 lb Date of Birth:  [birth year]       BSA:          2.462 m Patient Age:    70 years       BP:           130/60 mmHg Patient Gender: M              HR:           72 bpm. Exam Location:  [address]  Procedure: 2D Echo and 3D Echo  Indications:    Congestive Heart Failure I50.9  History:        Patient has prior history of Echocardiogram examinations, most recent 12/03/2019. CAD and Previous Myocardial Infarction; Risk Factors:Hypertension.  Sonographer:    Thurman Coyer RDCS Referring Phys: [id number] Bettey Mare LA[place]ENCE  IMPRESSIONS   1. Left ventricular ejection fraction, by estimation, is 60 to 65%. The left ventricle has normal function. The left ventricle has no regional wall motion abnormalities. Left  ventricular diastolic parameters were normal. 2. Right ventricular systolic function is normal. The right ventricular size is moderately enlarged. There is normal pulmonary artery systolic pressure. The estimated right ventricular systolic pressure is 24.5 mmHg. 3. The mitral valve is normal in structure. Trivial mitral valve regurgitation. No evidence of mitral stenosis. 4. The aortic valve is tricuspid. Aortic valve regurgitation is trivial. No aortic stenosis is present. 5. The inferior vena cava is normal in size with greater than 50% respiratory variability, suggesting right atrial pressure of 3 mmHg.  Comparison(s): No significant change from prior study.  FINDINGS Left Ventricle: Left ventricular ejection fraction, by estimation, is 60 to 65%. The left ventricle has normal function. The left ventricle has no regional wall motion abnormalities. 3D left ventricular ejection fraction analysis performed but not reported based on interpreter judgement due to suboptimal quality. The left ventricular internal cavity size was normal in size. There is borderline  left ventricular hypertrophy. Left ventricular diastolic parameters were normal.  Right Ventricle: The right ventricular size is moderately enlarged. No increase in right ventricular wall thickness. Right ventricular systolic function is normal. There is normal pulmonary artery systolic pressure. The tricuspid regurgitant velocity is 2.32 m/s, and with an assumed right atrial pressure of 3 mmHg, the estimated right ventricular systolic pressure is 24.5 mmHg.  Left Atrium: Left atrial size was normal in size.  Right Atrium: Right atrial size was normal in size.  Pericardium: There is no evidence of pericardial effusion.  Mitral Valve: The mitral valve is normal in structure. Trivial mitral valve regurgitation. No evidence of mitral valve stenosis.  Tricuspid Valve: The tricuspid valve is normal in structure. Tricuspid valve regurgitation is trivial.  Aortic Valve: The aortic valve is tricuspid. Aortic valve regurgitation is trivial. No aortic stenosis is present.  Pulmonic Valve: The pulmonic valve was normal in structure. Pulmonic valve regurgitation is trivial.  Aorta: The aortic root and ascending aorta are structurally normal, with no evidence of dilitation.  Venous: The inferior vena cava is normal in size with greater than 50% respiratory variability, suggesting right atrial pressure of 3 mmHg.  IAS/Shunts: No atrial level shunt detected by color flow Doppler.   LEFT VENTRICLE PLAX 2D LVIDd:         5.30 cm  Diastology LVIDs:         3.60 cm  LV e' medial:    9.25 cm/s LV PW:         1.00 cm  LV E/e' medial:  10.5 LV IVS:        1.00 cm  LV e' lateral:   8.70 cm/s LVOT diam:     2.10 cm  LV E/e' lateral: 11.2 LV SV:         90 LV SV Index:   37 LVOT Area:     3.46 cm  3D Volume EF: LV EDV:       199 ml LV ESV:       88 ml LV SV:        111 ml  RIGHT VENTRICLE RV S prime:     11.50 cm/s TAPSE (M-mode): 1.7 cm  LEFT ATRIUM           Index       RIGHT ATRIUM            Index LA diam:      3.40 cm 1.38 cm/m  RA Area:     24.70 cm LA Vol (A2C): 81.5 ml 33.10 ml/m RA Volume:  82.10 ml  33.34 ml/m LA Vol (A4C): 71.3 ml 28.96 ml/m AORTIC VALVE LVOT Vmax:   126.00 cm/s LVOT Vmean:  81.800 cm/s LVOT VTI:    0.261 m  AORTA Ao Root diam: 3.30 cm  MITRAL VALVE               TRICUSPID VALVE MV Area (PHT): 3.12 cm    TR Peak grad:   21.5 mmHg MV Decel Time: 243 msec    TR Vmax:        232.00 cm/s MV E velocity: 97.40 cm/s MV A velocity: 97.90 cm/s  SHUNTS MV E/A ratio:  0.99        Systemic VTI:  0.26 m Systemic Diam: 2.10 cm  Laurance Flatten MD Electronically signed by Laurance Flatten MD Signature Date/Time: 03/27/2021/11:29:08 AM    Final          ______________________________________________________________________________________________      Risk Assessment/Calculations:    CHA2DS2-VASc Score = 7   This indicates a 11.2% annual risk of stroke. The patient's score is based upon: CHF History: 1 HTN History: 1 Diabetes History: 1 Stroke History: 2 Vascular Disease History: 1 Age Score: 1 Gender Score: 0            Physical Exam:   VS:  BP 128/66 (BP Location: Left Arm, Patient Position: Sitting, Cuff Size: Large)   Pulse 72   Ht 5\' 9"  (1.753 m)   Wt 284 lb 12.8 oz (129.2 kg)   SpO2 95%   BMI 42.06 kg/m    Wt Readings from Last 3 Encounters:  01/02/24 284 lb 12.8 oz (129.2 kg)  03/23/23 279 lb 6 oz (126.7 kg)  02/28/23 281 lb 1.6 oz (127.5 kg)    GEN: Well nourished, well developed in no acute distress NECK: No JVD; No carotid bruits CARDIAC: RRR, no murmurs, rubs, gallops RESPIRATORY:  Clear to auscultation without rales, wheezing or rhonchi  ABDOMEN: Soft, non-tender, non-distended EXTREMITIES:  No edema; No deformity   ASSESSMENT AND PLAN: .     Chest Pain Intermittent chest pain with T wave inversion in anterior leads on EKG. - Order echocardiogram to assess cardiac function. - Order PET stress test  to evaluate for ischemia. - Refill nitroglycerin prescription.  Coronary Artery Disease (CAD) CAD with previous stenting, recent catheterization showed patent stents. Low suspicion of new blockage. - Evaluate echocardiogram and PET stress test results for new findings.  Chronic Diastolic Heart Failure Chronic diastolic heart failure with trace edema, managed with PRN torsemide. - close to euvolemic on exam - Evaluate echocardiogram for changes in cardiac function.  PAF: on Eliquis and metoprolol succinate  Hypertension Hypertension managed with amlodipine and losartan. Essential for managing diastolic heart failure. - Continue amlodipine and losartan.  Hyperlipidemia On Atorvastatin 80mg  daily     Informed Consent   Shared Decision Making/Informed Consent The risks [chest pain, shortness of breath, cardiac arrhythmias, dizziness, blood pressure fluctuations, myocardial infarction, stroke/transient ischemic attack, nausea, vomiting, allergic reaction, radiation exposure, metallic taste sensation and life-threatening complications (estimated to be 1 in 10,000)], benefits (risk stratification, diagnosing coronary artery disease, treatment guidance) and alternatives of a cardiac PET stress test were discussed in detail with Devon Mathews and he agrees to proceed.     Dispo: follow up in 6 weeks.   Signed, Devon Course, Devon Mathews

## 2024-01-02 NOTE — Patient Instructions (Signed)
 Medication Instructions:  NO CHANGES *If you need a refill on your cardiac medications before your next appointment, please call your pharmacy*   Lab Work: NO LABS If you have labs (blood work) drawn today and your tests are completely normal, you will receive your results only by: MyChart Message (if you have MyChart) OR A paper copy in the mail If you have any lab test that is abnormal or we need to change your treatment, we will call you to review the results.   Testing/Procedures:[address] Your physician has requested that you have an echocardiogram. Echocardiography is a painless test that uses sound waves to create images of your heart. It provides your doctor with information about the size and shape of your heart and how well your heart's chambers and valves are working. This procedure takes approximately one hour. There are no restrictions for this procedure. Please do NOT wear cologne, perfume, aftershave, or lotions (deodorant is allowed). Please arrive 15 minutes prior to your appointment time.  Please note: We ask at that you not bring children with you during ultrasound (echo/ vascular) testing. Due to room size and safety concerns, children are not allowed in the ultrasound rooms during exams. Our front office staff cannot provide observation of children in our lobby area while testing is being conducted. An adult accompanying a patient to their appointment will only be allowed in the ultrasound room at the discretion of the ultrasound technician under special circumstances. We apologize for any inconvenience.       Please report to Radiology at the [facility] Main Entrance 30 minutes early for your test.  [address] [place], [place] [address]   How to Prepare for Your Cardiac PET/CT Stress Test:  Nothing to eat or drink, except water, 3 hours prior to arrival time.  NO caffeine/decaffeinated products, or chocolate 12 hours prior to  arrival. (Please note decaffeinated beverages (teas/coffees) still contain caffeine).  If you have caffeine within 12 hours prior, the test will need to be rescheduled.  Medication instructions: Do not take erectile dysfunction medications for 72 hours prior to test (sildenafil, tadalafil) Do not take nitrates (isosorbide mononitrate, Ranexa) the day before or day of test Do not take tamsulosin the day before or morning of test Hold theophylline containing medications for 12 hours. Hold Dipyridamole 48 hours prior to the test.  Diabetic Preparation: If able to eat breakfast prior to 3 hour fasting, you may take all medications, including your insulin. Do not worry if you miss your breakfast dose of insulin - start at your next meal. If you do not eat prior to 3 hour fast-Hold all diabetes (oral and insulin) medications. Patients who wear a continuous glucose monitor MUST remove the device prior to scanning.  You may take your remaining medications with water.  NO perfume, cologne or lotion on chest or abdomen area. FEMALES - Please avoid wearing dresses to this appointment.  Total time is 1 to 2 hours; you may want to bring reading material for the waiting time.  In preparation for your appointment, medication and supplies will be purchased.  Appointment availability is limited, so if you need to cancel or reschedule, please call the Radiology Department Scheduler at [phone] 24 hours in advance to avoid a cancellation fee of $100.00  What to Expect When you Arrive:  Once you arrive and check in for your appointment, you will be taken to a preparation room within the Radiology Department.  A technologist or Nurse will obtain your medical history, verify that you are correctly prepped for the exam, and explain the procedure.  Afterwards, an IV will be started in your arm and electrodes will be placed on your skin for EKG monitoring during the stress portion of the exam. Then you will be  escorted to the PET/CT scanner.  There, staff will get you positioned on the scanner and obtain a blood pressure and EKG.  During the exam, you will continue to be connected to the EKG and blood pressure machines.  A small, safe amount of a radioactive tracer will be injected in your IV to obtain a series of pictures of your heart along with an injection of a stress agent.    After your Exam:  It is recommended that you eat a meal and drink a caffeinated beverage to counter act any effects of the stress agent.  Drink plenty of fluids for the remainder of the day and urinate frequently for the first couple of hours after the exam.  Your doctor will inform you of your test results within 7-10 business days.  For more information and frequently asked questions, please visit our website: [email or web address]  For questions about your test or how to prepare for your test, please call: Cardiac Imaging Nurse Navigators Office: [phone]    Follow-Up: At [facility], you and your health needs are our priority.  As part of our continuing mission to provide you with exceptional heart care, we have created designated Provider Care Teams.  These Care Teams include your primary Cardiologist (physician) and Advanced Practice Providers (APPs -  Physician Assistants and Nurse Practitioners) who all work together to provide you with the care you need, when you need it.  Your next appointment:   6 week(s)  Provider:   Azalee Course, PA  Other Instructions

## 2024-01-03 ENCOUNTER — Encounter: Payer: Self-pay | Admitting: Internal Medicine

## 2024-01-03 ENCOUNTER — Ambulatory Visit (HOSPITAL_COMMUNITY): Attending: Physician Assistant

## 2024-01-03 DIAGNOSIS — R079 Chest pain, unspecified: Secondary | ICD-10-CM | POA: Insufficient documentation

## 2024-01-03 DIAGNOSIS — I361 Nonrheumatic tricuspid (valve) insufficiency: Secondary | ICD-10-CM

## 2024-01-03 DIAGNOSIS — I371 Nonrheumatic pulmonary valve insufficiency: Secondary | ICD-10-CM

## 2024-01-03 LAB — ECHOCARDIOGRAM COMPLETE: S' Lateral: 3.18 cm

## 2024-01-11 DIAGNOSIS — R079 Chest pain, unspecified: Secondary | ICD-10-CM | POA: Diagnosis not present

## 2024-01-11 DIAGNOSIS — R0789 Other chest pain: Secondary | ICD-10-CM | POA: Diagnosis not present

## 2024-01-11 DIAGNOSIS — R030 Elevated blood-pressure reading, without diagnosis of hypertension: Secondary | ICD-10-CM | POA: Diagnosis not present

## 2024-01-11 DIAGNOSIS — M79601 Pain in right arm: Secondary | ICD-10-CM | POA: Diagnosis not present

## 2024-01-12 ENCOUNTER — Telehealth: Payer: Self-pay | Admitting: Cardiovascular Disease

## 2024-01-12 ENCOUNTER — Telehealth (HOSPITAL_BASED_OUTPATIENT_CLINIC_OR_DEPARTMENT_OTHER): Payer: Self-pay | Admitting: Cardiovascular Disease

## 2024-01-12 DIAGNOSIS — I5032 Chronic diastolic (congestive) heart failure: Secondary | ICD-10-CM | POA: Diagnosis not present

## 2024-01-12 DIAGNOSIS — G4733 Obstructive sleep apnea (adult) (pediatric): Secondary | ICD-10-CM | POA: Diagnosis not present

## 2024-01-12 DIAGNOSIS — E119 Type 2 diabetes mellitus without complications: Secondary | ICD-10-CM | POA: Diagnosis not present

## 2024-01-12 DIAGNOSIS — K760 Fatty (change of) liver, not elsewhere classified: Secondary | ICD-10-CM | POA: Diagnosis not present

## 2024-01-12 DIAGNOSIS — I2510 Atherosclerotic heart disease of native coronary artery without angina pectoris: Secondary | ICD-10-CM | POA: Diagnosis not present

## 2024-01-12 DIAGNOSIS — E782 Mixed hyperlipidemia: Secondary | ICD-10-CM | POA: Diagnosis not present

## 2024-01-12 DIAGNOSIS — Z7902 Long term (current) use of antithrombotics/antiplatelets: Secondary | ICD-10-CM | POA: Diagnosis not present

## 2024-01-12 DIAGNOSIS — Z955 Presence of coronary angioplasty implant and graft: Secondary | ICD-10-CM | POA: Diagnosis not present

## 2024-01-12 DIAGNOSIS — Z6841 Body Mass Index (BMI) 40.0 and over, adult: Secondary | ICD-10-CM | POA: Diagnosis not present

## 2024-01-12 DIAGNOSIS — Z7901 Long term (current) use of anticoagulants: Secondary | ICD-10-CM | POA: Diagnosis not present

## 2024-01-12 DIAGNOSIS — Z79899 Other long term (current) drug therapy: Secondary | ICD-10-CM | POA: Diagnosis not present

## 2024-01-12 DIAGNOSIS — R0789 Other chest pain: Secondary | ICD-10-CM | POA: Diagnosis not present

## 2024-01-12 DIAGNOSIS — I480 Paroxysmal atrial fibrillation: Secondary | ICD-10-CM | POA: Diagnosis not present

## 2024-01-12 DIAGNOSIS — K219 Gastro-esophageal reflux disease without esophagitis: Secondary | ICD-10-CM | POA: Diagnosis not present

## 2024-01-12 DIAGNOSIS — I252 Old myocardial infarction: Secondary | ICD-10-CM | POA: Diagnosis not present

## 2024-01-12 DIAGNOSIS — I110 Hypertensive heart disease with heart failure: Secondary | ICD-10-CM | POA: Diagnosis not present

## 2024-01-12 DIAGNOSIS — Z8673 Personal history of transient ischemic attack (TIA), and cerebral infarction without residual deficits: Secondary | ICD-10-CM | POA: Diagnosis not present

## 2024-01-12 DIAGNOSIS — R079 Chest pain, unspecified: Secondary | ICD-10-CM | POA: Diagnosis not present

## 2024-01-12 NOTE — Telephone Encounter (Signed)
 Returned call to pts wife (ok per DPR),    Wife states he was seen about two weeks for chest pain. He had an echo, with the result of ordering pet. Yesterday after work had more chest pain, so his coworker took him to the ED. Per wife ED doc is refusing to discharge pt without getting a stress test scheduled this week. Advised wife will consult with MD, but likely if test is needed that urgently it likely needs to be done in hospital.   Consulted with Dr. Duke Salvia, DOD, if chest pain is that severe, agree with not waiting until may for pet scan, but needs to be evaluated by cards in hospital. As patient is not in our hospital system, we cannot place orders without consult.   Returned call to patient and wife. Wife states they are refusing to do the stress test there due to his history of stent. Advised of the above recommendations from Dr. Duke Salvia. They are appreciative of the call.

## 2024-01-12 NOTE — Telephone Encounter (Signed)
 Spoke with Dr Kathryne Hitch ED physician [phone]  Patient needing to be transferred to [facility] for unstable angina, currently at [facility] in [facility]  They have reached out to Cardiology at [facility] and have not hear back   Called and spoke with Lanora Manis PA, above information given and he will reach out to Dr Constance Goltz   Advised wife, verbalized understand and expressed thanks for all of our help

## 2024-01-12 NOTE — Telephone Encounter (Signed)
 Pt returned call, transferred from call center,   They wanted to verify that [facility] was the only location for the test ordered, verified that it is!

## 2024-01-12 NOTE — Telephone Encounter (Signed)
 Pt spouse called in asking to speak with nurse about getting pt transported to [facility]

## 2024-01-12 NOTE — Telephone Encounter (Signed)
 Pt's wife calling tin regards to pt being admitted into hospital last night [facility]) due to CP. Wife states that hospital is requesting that our office put in order for pt to have Stress Test  before he is able to be released. Wife would like a c/b regarding this matter as soon as possible. Please advise

## 2024-01-13 DIAGNOSIS — R072 Precordial pain: Secondary | ICD-10-CM | POA: Diagnosis not present

## 2024-01-13 DIAGNOSIS — R079 Chest pain, unspecified: Secondary | ICD-10-CM | POA: Diagnosis not present

## 2024-01-25 ENCOUNTER — Ambulatory Visit: Attending: Nurse Practitioner | Admitting: Nurse Practitioner

## 2024-01-25 ENCOUNTER — Encounter: Payer: Self-pay | Admitting: Nurse Practitioner

## 2024-01-25 VITALS — BP 126/64 | HR 71 | Ht 69.5 in | Wt 290.8 lb

## 2024-01-25 DIAGNOSIS — I2510 Atherosclerotic heart disease of native coronary artery without angina pectoris: Secondary | ICD-10-CM | POA: Diagnosis not present

## 2024-01-25 DIAGNOSIS — I5032 Chronic diastolic (congestive) heart failure: Secondary | ICD-10-CM | POA: Diagnosis not present

## 2024-01-25 DIAGNOSIS — I10 Essential (primary) hypertension: Secondary | ICD-10-CM | POA: Diagnosis not present

## 2024-01-25 DIAGNOSIS — I480 Paroxysmal atrial fibrillation: Secondary | ICD-10-CM | POA: Diagnosis not present

## 2024-01-25 DIAGNOSIS — E785 Hyperlipidemia, unspecified: Secondary | ICD-10-CM | POA: Diagnosis not present

## 2024-01-25 DIAGNOSIS — G4733 Obstructive sleep apnea (adult) (pediatric): Secondary | ICD-10-CM | POA: Diagnosis not present

## 2024-01-25 DIAGNOSIS — Z8673 Personal history of transient ischemic attack (TIA), and cerebral infarction without residual deficits: Secondary | ICD-10-CM

## 2024-01-25 MED ORDER — NITROGLYCERIN 0.4 MG SL SUBL: 0.4000 mg | SUBLINGUAL_TABLET | SUBLINGUAL | 3 refills | Status: DC | PRN

## 2024-01-25 MED ORDER — TORSEMIDE 20 MG PO TABS: ORAL_TABLET | ORAL | 3 refills | Status: DC

## 2024-01-25 NOTE — Progress Notes (Unsigned)
 Office Visit    Patient Name: Devon Mathews. Date of Encounter: 01/25/2024  Primary Care Provider:  Gaspar Garbe, MD Primary Cardiologist:  Chilton Si, MD  Chief Complaint    73 year old male with a history of CAD s/p DES-p-mLAD in 2019, s/p PTCA-D1 in 2020, paroxysmal atrial fibrillation, chronic diastolic heart failure, hypertension, hyperlipidemia, TIA, OSA, obesity and GERD, who presents for follow-up related to CAD.  Past Medical History    Past Medical History:  Diagnosis Date   Anxiety    Atrial fibrillation (HCC)    CAD S/P percutaneous coronary angioplasty 09/13/2018    99% p-mLAD (BTW d1&d2) -> SYNERGY DES 3.5 X 12 (3.75 mm). 20% LM.  Cx, small RI & co-dom RCA normal.  EF 65%.    Chronic diastolic heart failure (HCC) 11/30/2019   Congestive heart failure (CHF) (HCC)    Diabetes mellitus without complication (HCC)    Dizziness 2012   normal findings..   Essential hypertension 07/28/2016   GERD (gastroesophageal reflux disease)    Hyperlipidemia    Lower extremity edema 07/28/2016   NAFLD (nonalcoholic fatty liver disease)    NSTEMI (non-ST elevated myocardial infarction) (HCC) 09/13/2018   The patient presented 09/13/2018 with a non-ST elevation MI, troponin peak was 0.15   Obesity    Obesity (BMI 30-39.9) 07/28/2016   OSA (obstructive sleep apnea) 07/28/2016   Restless leg syndrome    mild   SCCA (squamous cell carcinoma) of skin 08/24/2021   Left Forehead (in situ-inflitrating)   Shortness of breath 07/28/2016   Sleep apnea    Squamous cell carcinoma of skin 03/20/2015   right ant scalp(curetx3, 110fu)   Squamous cell carcinoma of skin 01/01/2016   left lateral forehead (curetx3, 29fu)   Squamous cell carcinoma of skin 06/15/2019   left mid anterior scalp (tx afte bx)   Squamous cell carcinoma of skin 01/08/2021   well diff- left forehead   Squamous cell carcinoma of skin 01/08/2021   in situ- mid frontal scalp   TIA (transient ischemic attack)     Tubular adenoma    Past Surgical History:  Procedure Laterality Date   COLONOSCOPY     CORONARY BALLOON ANGIOPLASTY N/A 12/05/2018   Procedure: CORONARY BALLOON ANGIOPLASTY;  Surgeon: Marykay Lex, MD;  Location: [facility];  Service: Cardiovascular;  Laterality: N/A;   CORONARY STENT INTERVENTION N/A 09/13/2018   Procedure: CORONARY STENT INTERVENTION;  Surgeon: Lyn Records, MD;  Location: [facility];  Service: Cardiovascular;; p-m LAD (btw D1&D2) - SYNERGY DES 3.5 x 12 (3.75 mm).   ESOPHAGOGASTRODUODENOSCOPY (EGD) WITH PROPOFOL N/A 03/01/2022   Procedure: ESOPHAGOGASTRODUODENOSCOPY (EGD) WITH PROPOFOL;  Surgeon: Iva Boop, MD;  Location: [facility];  Service: Gastroenterology;  Laterality: N/A;  Polypectomy   GASTRIC VARICES BANDING  03/01/2022   Procedure: GASTRIC  BANDING;  Surgeon: Iva Boop, MD;  Location: [facility];  Service: Gastroenterology;;   KNEE ARTHROSCOPY Right    LEFT HEART CATH AND CORONARY ANGIOGRAPHY N/A 09/13/2018   Procedure: LEFT HEART CATH AND CORONARY ANGIOGRAPHY;  Surgeon: Lyn Records, MD;  Location: [facility];  Service: Cardiovascular;;  99% p-mLAD (BTW d1&d2) -> SYNERGY DES 3.5 X 12 (3.75 mm). 20% LM.  Cx, small RI & co-dom RCA normal.  EF 65%.    LEFT HEART CATH AND CORONARY ANGIOGRAPHY  2003   30-40% proximal segmental stenosis in the LAD   LEFT HEART CATH AND CORONARY ANGIOGRAPHY  2006  EF >50% & no mitral regurgitation   LEFT HEART CATH AND CORONARY ANGIOGRAPHY N/A 12/05/2018   Procedure: LEFT HEART CATH AND CORONARY ANGIOGRAPHY;  Surgeon: Marykay Lex, MD;  Location: [facility] INVASIVE CV LAB;  Service: Cardiovascular;  Laterality: N/A;   LEFT HEART CATH AND CORONARY ANGIOGRAPHY N/A 08/17/2022   Procedure: LEFT HEART CATH AND CORONARY ANGIOGRAPHY;  Surgeon: Tonny Bollman, MD;  Location: [facility] INVASIVE CV LAB;  Service: Cardiovascular;  Laterality: N/A;   NM MYOVIEW LTD  07/2016   Normal.  EF 55-65% 63%).  No  ischemia or infarction.  LOW RISK   NM MYOVIEW LTD  11/2018   6: 34 min.  7.2 METS.  Noted chest pain and tightness.  Horizontal ST of T wave depression (2 mm) II, 3, V5 and V6 (HIGH RISK), large/severe defect basal-apical inferior, inferolateral wall.  Large-severe basal-apical anterior-anterolateral wall.  Medium/moderate defect in basal and mid inferoseptal wall.  Consistent with large prior infarct with peri-infarct ischemia.  HIGH RISK   PERICARDIOCENTESIS N/A 12/05/2018   Procedure: PERICARDIOCENTESIS;  Surgeon: Marykay Lex, MD;  Location: [facility] INVASIVE CV LAB;  Service: Cardiovascular;  Laterality: N/A;   SUBXYPHOID PERICARDIAL WINDOW N/A 12/25/2018   Procedure: SUBXYPHOID PERICARDIAL WINDOW;  Surgeon: Alleen Borne, MD;  Location: [facility] OR;  Service: Thoracic;  Laterality: N/A;   TEE WITHOUT CARDIOVERSION N/A 12/25/2018   Procedure: TRANSESOPHAGEAL ECHOCARDIOGRAM (TEE);  Surgeon: Alleen Borne, MD;  Location: [facility] OR;  Service: Thoracic;  Laterality: N/A;   tendon detachment Right    arm   TRANSTHORACIC ECHOCARDIOGRAM  07/2016   EF 60-65% with mild LVH.  Mild AI.  Trivial MR and TR.   VASECTOMY      Allergies  Allergies  Allergen Reactions   Penicillins Hives and Nausea And Vomiting    Did it involve swelling of the face/tongue/throat, SOB, or low BP? No Did it involve sudden or severe rash/hives, skin peeling, or any reaction on the inside of your mouth or nose? No Did you need to seek medical attention at a hospital or doctor's office? No When did it last happen?      30 + years If all above answers are "NO", may proceed with cephalosporin use.      Labs/Other Studies Reviewed    The following studies were reviewed today:  Cardiac Studies & Procedures   ______________________________________________________________________________________________ CARDIAC CATHETERIZATION  CARDIAC CATHETERIZATION 08/17/2022  Narrative   Ost 1st Diag lesion is 30% stenosed.  1.   Patent left main with no obstructive disease 2.  Patent LAD, including the stented segment in the proximal vessel, with continued patency of the diagonal branch that was intervened upon in 2020 3.  Patent circumflex with mild to moderate nonobstructive stenosis in the mid vessel after the first OM branch, does not appear flow-limiting 4.  Patent RCA without significant stenosis 5.  Normal LVEDP  Recommendations: Ongoing medical therapy for nonobstructive coronary artery disease  Findings Coronary Findings Diagnostic  Dominance: Right  Left Anterior Descending Prox LAD lesion is 30% stenosed. The lesion was previously treated . Mid LAD lesion is 30% stenosed.  First Diagonal Branch The diagonal branch, previously intervened upon, and is now occluded at its origin with no collateral flow present to visualize this vessel. Ost 1st Diag lesion is 30% stenosed.  Left Circumflex Prox Cx lesion is 50% stenosed.  Right Coronary Artery The vessel exhibits minimal luminal irregularities. Dominant vessel, minimal irregularity with no obstructive disease.  Supplies a PDA and PLA  branch.  Right Posterior Descending Artery The vessel exhibits minimal luminal irregularities.  Intervention  No interventions have been documented.   CARDIAC CATHETERIZATION  CARDIAC CATHETERIZATION 12/05/2018  Narrative  Previously placed Prox LAD drug eluting stent is widely patent.  Ost 1st Diag lesion is 95% stenosed.  Scoring balloon angioplasty was performed using a BALLOON WOLVERINE 2.50X10.  Post intervention, there is a 20% residual stenosis.  The left ventricular systolic function is normal. The left ventricular ejection fraction is 55-65% by visual estimate.  Successful occlusion of distal diagonal branch with prolonged balloon inflations and protamine infusion after initial wire related perforation of the distal diagonal branch.  1st Diag lesion is 100% stenosed.  SUMMARY  Culprit  lesion:  90% ostial 1st Diag (jailed) -successful scoring balloon angioplasty using 2.5 mm Wolverine scoring balloon  --Distal diagonal wire perforation with pericardial effusion and initial stages of pericardial tamponade --> successful occlusion with prolonged balloon inflations and administration of protamine  --Unsuccessful pericardiocentesis  Widely patent LAD stent  Otherwise stable coronary arteries.  Normal LVEF and EDP pre-PCI  Clear ST elevations for roughly 15 minutes, most likely type for MI.   RECOMMENDATIONS  Admit to CCU on Levophed --continue Levophed overnight  Monitor closely for signs of tamponade.  Follow troponins for likely type IV MI  Stat echocardiogram ordered.  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  Dr. Laneta Simmers from CT surgery is aware the patient & current condition  Findings Coronary Findings Diagnostic  Dominance: Right  Left Anterior Descending Previously placed Prox LAD drug eluting stent is widely patent.  First Diagonal Branch Ost 1st Diag lesion is 95% stenosed. The lesion is located at the bifurcation and eccentric. Jailed 1st Diag lesion is 100% stenosed. Distal diagonal branch occlusion after prolonged balloon angioplasty.  This was in response to wire perforation.  Intervention  Ost 1st Diag lesion Angioplasty Lesion length:  10 mm. CATH VISTA GUIDE 6FR XBLAD3.5 guide catheter was inserted. WIRE SION BLACK 190 guidewire used to cross lesion. Scoring balloon angioplasty was performed using a BALLOON WOLVERINE 2.50X10. Maximum pressure: 12 atm. Inflation time: 60 sec. WIRE SION Blue used for LAD protection Post-Intervention Lesion Assessment The intervention was successful. Pre-interventional TIMI flow is 3. Post-intervention TIMI flow is 3. Treated lesion length:  10 mm. At this lesion, a perforation  of the vessel occurred. -Unfortunately, the side on black wire advanced too far distally with scoring balloon removal and this led to distal diagonal wire perforation. With post PTCA angiography revealing distal perforation, I used the 2.0 mm balloon that initially was used for predilation for prolonged inflations in the distal diagonal branch.  Initial attempts seem to be successful, however there was still blush.  The patient then became hypotensive with concerns for possible tamponade physiology.  He was started on IV Levophed and given a 1000 mL bolus. The balloon was then reinserted with now prolonged inflations of 15, 15 and 20 minutes held.  After first prolonged inflation, there was still some blush, therefore protamine was administered.  After the second do, no further blush was noted.  Of note, the patient did become quite symptomatic with the dropping of his blood pressure and then began to have significant chest pain with a prolonged balloon inflation on the second occasion. There is a 20% residual stenosis post intervention.   STRESS TESTS  MYOCARDIAL PERFUSION IMAGING 12/01/2018  Narrative  Nuclear stress EF: 54%.  The left ventricular ejection fraction is mildly decreased (45-54%).  Horizontal ST segment depression ST segment depression of 2 mm was noted during stress in the II, III, V5 and V6 leads, beginning at 8 minutes of stressST deviation beginning in recovery.  Defect 1: There is a large defect of severe severity present in the basal inferior, basal inferolateral, mid inferior, mid inferolateral, apical inferior, apical lateral and apex location.  Defect 2: There is a large defect of severe severity present in the basal anterior, basal anterolateral, mid anterior, mid anterolateral, apical anterior and apex location.  Defect 3: There is a medium defect of moderate severity present in the basal inferoseptal and mid inferoseptal location.  Findings consistent with ischemia  and prior myocardial infarction with peri-infarct ischemia.  This is a high risk study.  There is a medium size, severe perfusion defect in the inferior and inferolateral walls with stress that is partially reversible at rest, returning to moderate. Base to apex. There is a absent, large perfusion defect in the anterior wall from base to apex. It is partially reversible at rest and returns to moderate at the base and mid ventricle and returns to mild at the apex. Anterolateral defect is severe, and with full reversibility. Medium size, moderate perfusion defect in the inferoseptum with partial reversibility, returning to mild at rest.  ECG positive for ischemia in recovery.  Chest pain and shortness of breath with peak stress.  High risk study. Consider coronary angiography if clinically indicated.   ECHOCARDIOGRAM  ECHOCARDIOGRAM COMPLETE 01/03/2024  Narrative ECHOCARDIOGRAM REPORT    Patient Name:   Devon Mathews. Date of Exam: 01/03/2024 Medical Rec #:  [id number]      Height:       69.0 in Accession #:    [id number]     Weight:       284.8 lb Date of Birth:  1950-10-22       BSA:          2.401 m Patient Age:    72 years       BP:           147/70 mmHg Patient Gender: M              HR:           73 bpm. Exam Location:  [address]  Procedure: 2D Echo, 3D Echo, Cardiac Doppler, Color Doppler and Strain Analysis (Both Spectral and Color Flow Doppler were utilized during procedure).  Indications:    Chest pain R07.9  History:        Patient has prior history of Echocardiogram examinations, most recent 03/27/2021. Diastolic heart failure, Previous Myocardial Infarction and CAD, TIA, Signs/Symptoms:Chest Pain; Risk Factors:morbid obesity, Family History of Coronary Artery Disease, Dyslipidemia, Sleep Apnea and Diabetes. Previous echo revealed LVEF 65% PAP 24.5 mmHg.  Sonographer:    Chanetta Marshall BA, RDCS Referring Phys: [phone] HAO MENG  IMPRESSIONS   1. Left  ventricular ejection fraction, by estimation, is 60 to 65%. Left ventricular ejection fraction by 3D volume is 67 %. The left ventricle has normal function. The left ventricle has no regional wall motion abnormalities. Left ventricular diastolic parameters were normal. The average left ventricular global longitudinal strain is -19.5 %. The global longitudinal strain is normal. 2. Right ventricular systolic function is normal. The right ventricular size is mildly enlarged. 3. Left atrial size was mildly dilated. 4. Right atrial size was moderately dilated. 5. The  mitral valve is normal in structure. Trivial mitral valve regurgitation. 6. The aortic valve is tricuspid. Aortic valve regurgitation is mild. No aortic stenosis is present. 7. The inferior vena cava is normal in size with greater than 50% respiratory variability, suggesting right atrial pressure of 3 mmHg.  Comparison(s): No significant change from prior study. Prior images reviewed side by side.  FINDINGS Left Ventricle: Left ventricular ejection fraction, by estimation, is 60 to 65%. Left ventricular ejection fraction by 3D volume is 67 %. The left ventricle has normal function. The left ventricle has no regional wall motion abnormalities. The average left ventricular global longitudinal strain is -19.5 %. Strain was performed and the global longitudinal strain is normal. The left ventricular internal cavity size was normal in size. There is no left ventricular hypertrophy. Left ventricular diastolic parameters were normal.  Right Ventricle: The right ventricular size is mildly enlarged. No increase in right ventricular wall thickness. Right ventricular systolic function is normal. The tricuspid regurgitant velocity is 2.96 m/s, and with an assumed right atrial pressure of 3 mmHg, the estimated right ventricular systolic pressure is 38.0 mmHg.  Left Atrium: Left atrial size was mildly dilated.  Right Atrium: Right atrial size was  moderately dilated.  Pericardium: There is no evidence of pericardial effusion.  Mitral Valve: The mitral valve is normal in structure. Trivial mitral valve regurgitation.  Tricuspid Valve: The tricuspid valve is normal in structure. Tricuspid valve regurgitation is mild.  Aortic Valve: The aortic valve is tricuspid. Aortic valve regurgitation is mild. No aortic stenosis is present.  Pulmonic Valve: The pulmonic valve was grossly normal. Pulmonic valve regurgitation is mild. No evidence of pulmonic stenosis.  Aorta: The aortic root and ascending aorta are structurally normal, with no evidence of dilitation.  Venous: The inferior vena cava is normal in size with greater than 50% respiratory variability, suggesting right atrial pressure of 3 mmHg.  IAS/Shunts: No atrial level shunt detected by color flow Doppler.  Additional Comments: 3D was performed not requiring image post processing on an independent workstation and was normal.   LEFT VENTRICLE PLAX 2D LVIDd:         5.47 cm         Diastology LVIDs:         3.18 cm         LV e' medial:    8.16 cm/s LV PW:         0.94 cm         LV E/e' medial:  10.9 LV IVS:        1.24 cm         LV e' lateral:   11.20 cm/s LVOT diam:     2.20 cm         LV E/e' lateral: 7.9 LV SV:         80 LV SV Index:   33              2D Longitudinal LVOT Area:     3.80 cm        Strain 2D Strain GLS   -20.1 % (A4C): 2D Strain GLS   -17.9 % (A3C): 2D Strain GLS   -20.4 % (A2C): 2D Strain GLS   -19.5 % Avg:  3D Volume EF LV 3D EF:    Left ventricul ar ejection fraction by 3D volume is 67 %.  3D Volume EF: 3D EF:        67 % LV EDV:  222 ml LV ESV:       74 ml LV SV:        148 ml  RIGHT VENTRICLE RV Basal diam:  4.45 cm RV Mid diam:    3.58 cm RV S prime:     16.90 cm/s TAPSE (M-mode): 2.2 cm RVSP:           38.0 mmHg  LEFT ATRIUM             Index        RIGHT ATRIUM           Index LA diam:        4.20 cm 1.75 cm/m    RA Pressure: 3.00 mmHg LA Vol (A2C):   66.7 ml 27.78 ml/m  RA Area:     23.80 cm LA Vol (A4C):   52.4 ml 21.83 ml/m  RA Volume:   78.30 ml  32.61 ml/m LA Biplane Vol: 59.6 ml 24.82 ml/m AORTIC VALVE LVOT Vmax:   113.00 cm/s LVOT Vmean:  76.200 cm/s LVOT VTI:    0.211 m  AORTA Ao Root diam: 3.20 cm Ao Asc diam:  3.60 cm  MV E velocity: 88.60 cm/s  TRICUSPID VALVE MV A velocity: 97.90 cm/s  TR Peak grad:   35.0 mmHg MV E/A ratio:  0.91        TR Vmax:        296.00 cm/s Estimated RAP:  3.00 mmHg RVSP:           38.0 mmHg  SHUNTS Systemic VTI:  0.21 m Systemic Diam: 2.20 cm  Thurmon Fair MD Electronically signed by Thurmon Fair MD Signature Date/Time: 01/03/2024/12:05:13 PM    Final          ______________________________________________________________________________________________     Recent Labs: 03/23/2023: ALT 28; BUN 20; Creatinine, Ser 1.12; Hemoglobin 13.1; Platelets 184.0; Potassium 4.4; Sodium 142  Recent Lipid Panel    Component Value Date/Time   CHOL 101 02/14/2023 0848   TRIG 99 02/14/2023 0848   HDL 34 (L) 02/14/2023 0848   CHOLHDL 2.5 01/07/2022 0910   CHOLHDL 3.0 12/21/2018 0528   VLDL 11 12/21/2018 0528   LDLCALC 48 02/14/2023 0848    History of Present Illness    73 year old male with the above past medical history including CAD s/p DES-p-mLAD in 2019, s/p PTCA-D1 in 2020, paroxysmal atrial fibrillation, chronic diastolic heart failure, hypertension, hyperlipidemia, TIA, OSA, obesity and GERD.  Cardiac catheterization in 2006 revealed mild nonobstructive CAD.  Subsequent Myoviews were low risk.  He was hospitalized in November 2019 in the setting of NSTEMI.  Cardiac catheterization revealed 95% pLAD stenosis s/p DES.  Repeat cardiac catheterization in 2020 revealed 90% ostial first diagonal stenosis (jailed in the setting of prior stent), s/p successful scoring balloon angioplasty, widely patent LAD stent.  Most recent cardiac  catheterization in 2023 revealed patent LAD stent, continue patency of diagonal branch, patent LCx with mild to moderate nonobstructive stenosis in the mid vessel after the first OM branch, not flow-limiting, patent RCA without significant stenosis, normal LVEDP. Ongoing medical therapy for nonobstructive CAD was advised. He was last seen in the office on 01/02/2024 and reported intermittent chest discomfort.  Repeat echocardiogram in 12/2023 showed EF 60 to 65%, normal LV function, no RWMA, normal RV systolic function, moderately dilated right atrium, mild aortic valve regurgitation, no significant change from prior study.  Cardiac PET stress test was ordered, but not yet completed. He was hospitalized in March 2025 at [facility]  in the setting of chest pressure. Troponin was negative x 3. EKG was nonischemic. Chest x-ray was unremarkable.  Lexiscan Myoview was negative for ischemia. Outpatient follow-up with cardiology was recommended.   He presents today for follow-up accompanied by his wife.  Since his last visit and since his recent hospitalization he has been stable from a cardiac standpoint.  He reports mild dyspnea on exertion, increased bilateral lower extremity edema, weight gain, mild orthopnea.  He denies chest pain, palpitations, dizziness, PND.  He is taking his torsemide every 3 days.   Home Medications    Current Outpatient Medications  Medication Sig Dispense Refill   acetaminophen (TYLENOL) 500 MG tablet Take 500-1,000 mg by mouth every 6 (six) hours as needed for moderate pain.     amitriptyline (ELAVIL) 25 MG tablet Take 0.5 tablets (12.5 mg total) by mouth at bedtime as needed for sleep. 30 tablet 0   amLODipine (NORVASC) 2.5 MG tablet Take 1 tablet (2.5 mg total) by mouth daily. 90 tablet 3   atorvastatin (LIPITOR) 80 MG tablet TAKE 1 TABLET BY MOUTH EVERY DAY 90 tablet 2   clopidogrel (PLAVIX) 75 MG tablet Take 1 tablet (75 mg total) by mouth daily. Please call [phone] to  schedule a follow up visit. 90 tablet 1   ELIQUIS 5 MG TABS tablet Take 1 tablet (5 mg total) by mouth 2 (two) times daily. 180 tablet 0   escitalopram (LEXAPRO) 10 MG tablet Take 10 mg by mouth daily.     losartan (COZAAR) 50 MG tablet TAKE 1 TABLET BY MOUTH EVERY DAY 90 tablet 1   methocarbamol (ROBAXIN) 500 MG tablet Take 500 mg by mouth 3 (three) times daily as needed.     metoprolol succinate (TOPROL-XL) 50 MG 24 hr tablet TAKE 1 TABLET BY MOUTH EVERY DAY WITH OR IMMEDIATELY FOLLOWING A MEAL (Patient taking differently: Take 50 mg by mouth daily.) 90 tablet 1   nitroGLYCERIN (NITROSTAT) 0.4 MG SL tablet Place 1 tablet (0.4 mg total) under the tongue every 5 (five) minutes as needed for chest pain. 25 tablet 3   pantoprazole (PROTONIX) 40 MG tablet TAKE ONE TABLET BY MOUTH ONE TIME DAILY. must make follow up appointment for further refills (Patient taking differently: Take 40 mg by mouth daily.) 15 tablet 0   potassium chloride SA (KLOR-CON M) 20 MEQ tablet Take 20 mEq by mouth daily as needed (when taking furosemide).     predniSONE (STERAPRED UNI-PAK 21 TAB) 5 MG (21) TBPK tablet Take 5 mg by mouth daily.     torsemide (DEMADEX) 20 MG tablet Take 20 mg by mouth. Every 3 days     Current Facility-Administered Medications  Medication Dose Route Frequency Provider Last Rate Last Admin   sodium chloride flush (NS) 0.9 % injection 3 mL  3 mL Intravenous Q12H Chilton Si, MD         Review of Systems    He denies chest pain, palpitations, pnd, n, v, dizziness, syncope, or early satiety. All other systems reviewed and are otherwise negative except as noted above.   Physical Exam    VS:  BP 126/64 (BP Location: Right Arm, Patient Position: Sitting, Cuff Size: Normal)   Pulse 71   Ht 5' 9.5" (1.765 m)   Wt 290 lb 12.8 oz (131.9 kg)   SpO2 94%   BMI 42.33 kg/m   GEN: Well nourished, well developed, in no acute distress. HEENT: normal. Neck: Supple, no JVD, carotid bruits, or  masses. Cardiac: RRR,  no murmurs, rubs, or gallops. No clubbing, cyanosis, nonpitting bilateral lower extremity edema.  Radials/DP/PT 2+ and equal bilaterally.  Respiratory:  Respirations regular and unlabored, clear to auscultation bilaterally. GI: Soft, nontender, nondistended, BS + x 4. MS: no deformity or atrophy. Skin: warm and dry, no rash. Neuro:  Strength and sensation are intact. Psych: Normal affect.  Accessory Clinical Findings    ECG personally reviewed by me today - EKG Interpretation Date/Time:  Wednesday January 25 2024 13:21:56 EDT Ventricular Rate:  72 PR Interval:  194 QRS Duration:  94 QT Interval:  408 QTC Calculation: 446 R Axis:   24  Text Interpretation: Normal sinus rhythm Nonspecific ST and T wave abnormality When compared with ECG of 02-Jan-2024 09:45, No significant change was found Confirmed by Bernadene Person ([id number]) on 01/25/2024 1:24:16 PM  - no acute changes.   Lab Results  Component Value Date   WBC 3.8 (L) 03/23/2023   HGB 13.1 03/23/2023   HCT 39.9 03/23/2023   MCV 95.9 03/23/2023   PLT 184.0 03/23/2023   Lab Results  Component Value Date   CREATININE 1.12 03/23/2023   BUN 20 03/23/2023   NA 142 03/23/2023   K 4.4 03/23/2023   CL 106 03/23/2023   CO2 24 03/23/2023   Lab Results  Component Value Date   ALT 28 03/23/2023   AST 26 03/23/2023   ALKPHOS 66 03/23/2023   BILITOT 0.6 03/23/2023   Lab Results  Component Value Date   CHOL 101 02/14/2023   HDL 34 (L) 02/14/2023   LDLCALC 48 02/14/2023   TRIG 99 02/14/2023   CHOLHDL 2.5 01/07/2022    Lab Results  Component Value Date   HGBA1C 6.0 (H) 02/14/2023    Assessment & Plan    1. CAD: S/p DES-p-mLAD in 2019, s/p PTCA-D1 in 2020. Echo in 12/2023 showed EF 60 to 65%, normal LV function, no RWMA, normal RV systolic function, moderately dilated right atrium, mild aortic valve regurgitation, no significant change from prior study.  Cardiac PET stress test was ordered, but not yet  completed. He was hospitalized in March 2025 at [facility] in the setting of chest pressure. Troponin was negative x 3. EKG was nonischemic. Chest x-ray was unremarkable.  Lexiscan Myoview was negative for ischemia.  He denies any further chest pain. Through shared decision making, given recent reassuring echocardiogram, recent negative stress test will cancel cardiac PET stress test for now.  Continue to monitor symptoms.  Continue metoprolol, losartan, Plavix, and Lipitor.   2. Paroxysmal atrial fibrillation: Maintaining NSR.  He denies palpitations.  Continue metoprolol, Eliquis.  3. Chronic diastolic heart failure: Most recent echo stable as above. He reports mild dyspnea on exertion, increased bilateral lower extremity edema, weight gain, mild orthopnea.  He does have nonpitting bilateral lower extremity edema on exam. Advised him to take torsemide 20 mg daily x 5 days followed by torsemide 20 mg daily as needed for swelling, weight gain.  Will check BMET in 7 to 10 days.  Continue to monitor daily weights, discussed sodium and fluid recommendations.  Continue metoprolol, losartan.  4. Hypertension: BP well controlled. Continue current antihypertensive regimen.   5. Hyperlipidemia: LDL was 48 in 12/2023.  Continue Lipitor.  6. History of TIA: No recurrence or residual.  Continue Plavix, Lipitor.  7. OSA: Adherent to CPAP.   8. Disposition: Follow-up in 2 to 3 months.      Joylene Grapes, NP 01/25/2024, 1:24 PM

## 2024-01-25 NOTE — Patient Instructions (Signed)
 Medication Instructions:  Torsemide 20 mg daily for 5 days, then take 20 mg daily as needed for weight gain or swelling.   *If you need a refill on your cardiac medications before your next appointment, please call your pharmacy*  Lab Work: BMET in 7-10 days  Testing/Procedures: NONE ordered at this time of appointment   Follow-Up: At [facility], you and your health needs are our priority.  As part of our continuing mission to provide you with exceptional heart care, our providers are all part of one team.  This team includes your primary Cardiologist (physician) and Advanced Practice Providers or APPs (Physician Assistants and Nurse Practitioners) who all work together to provide you with the care you need, when you need it.  Your next appointment:   2-3 month(s)  Provider:    Azalee Course, PA-C or Bernadene Person, NP        We recommend signing up for the patient portal called "MyChart".  Sign up information is provided on this After Visit Summary.  MyChart is used to connect with patients for Virtual Visits (Telemedicine).  Patients are able to view lab/test results, encounter notes, upcoming appointments, etc.  Non-urgent messages can be sent to your provider as well.   To learn more about what you can do with MyChart, go to [email or web address].   Other Instructions       1st Floor: - Lobby - Registration  - Pharmacy  - Lab - Cafe  2nd Floor: - PV Lab - Diagnostic Testing (echo, CT, nuclear med)  3rd Floor: - Vacant  4th Floor: - TCTS (cardiothoracic surgery) - AFib Clinic - Structural Heart Clinic - Vascular Surgery  - Vascular Ultrasound  5th Floor: - [facility] (general and EP) - Clinical Pharmacy for coumadin, hypertension, lipid, weight-loss medications, and med management appointments    Valet parking services will be available as well.

## 2024-01-26 ENCOUNTER — Encounter: Payer: Self-pay | Admitting: Nurse Practitioner

## 2024-02-09 ENCOUNTER — Ambulatory Visit: Admitting: Physician Assistant

## 2024-02-14 ENCOUNTER — Telehealth (HOSPITAL_BASED_OUTPATIENT_CLINIC_OR_DEPARTMENT_OTHER): Payer: Self-pay | Admitting: Cardiovascular Disease

## 2024-02-14 NOTE — Telephone Encounter (Signed)
 Pts wife dropped off Medical Certification App for Disabilty placard. Will be in providers box. Copy has been made and placed in apprpriate folder. Please contact pt's spouse when complete so she can pick it up.

## 2024-02-14 NOTE — Telephone Encounter (Signed)
 Ready for pickup- pt notified via VM

## 2024-02-14 NOTE — Telephone Encounter (Signed)
 Pt is needing a refill of nitroglycerin  to be refilled at [facility] on [address] in [place]. Patients spouse states they have called and tried to get it refilled with no success. Please call patient if any concerns.

## 2024-02-14 NOTE — Telephone Encounter (Signed)
 Will grab from box and give to MD to sign

## 2024-02-17 ENCOUNTER — Other Ambulatory Visit (HOSPITAL_BASED_OUTPATIENT_CLINIC_OR_DEPARTMENT_OTHER): Payer: Self-pay | Admitting: Cardiovascular Disease

## 2024-02-24 DIAGNOSIS — Z7901 Long term (current) use of anticoagulants: Secondary | ICD-10-CM | POA: Diagnosis not present

## 2024-02-24 DIAGNOSIS — R0600 Dyspnea, unspecified: Secondary | ICD-10-CM | POA: Diagnosis not present

## 2024-02-24 DIAGNOSIS — I509 Heart failure, unspecified: Secondary | ICD-10-CM | POA: Diagnosis not present

## 2024-02-24 DIAGNOSIS — R053 Chronic cough: Secondary | ICD-10-CM | POA: Diagnosis not present

## 2024-02-24 DIAGNOSIS — Z6841 Body Mass Index (BMI) 40.0 and over, adult: Secondary | ICD-10-CM | POA: Diagnosis not present

## 2024-02-24 DIAGNOSIS — I2510 Atherosclerotic heart disease of native coronary artery without angina pectoris: Secondary | ICD-10-CM | POA: Diagnosis not present

## 2024-02-24 DIAGNOSIS — K219 Gastro-esophageal reflux disease without esophagitis: Secondary | ICD-10-CM | POA: Diagnosis not present

## 2024-02-24 DIAGNOSIS — R0789 Other chest pain: Secondary | ICD-10-CM | POA: Diagnosis not present

## 2024-02-24 DIAGNOSIS — G4733 Obstructive sleep apnea (adult) (pediatric): Secondary | ICD-10-CM | POA: Diagnosis not present

## 2024-02-24 DIAGNOSIS — I480 Paroxysmal atrial fibrillation: Secondary | ICD-10-CM | POA: Diagnosis not present

## 2024-02-24 DIAGNOSIS — I110 Hypertensive heart disease with heart failure: Secondary | ICD-10-CM | POA: Diagnosis not present

## 2024-03-08 ENCOUNTER — Encounter (HOSPITAL_COMMUNITY): Payer: Self-pay

## 2024-03-13 ENCOUNTER — Encounter (HOSPITAL_COMMUNITY): Admission: RE | Admit: 2024-03-13 | Source: Ambulatory Visit

## 2024-03-19 DIAGNOSIS — H3500 Unspecified background retinopathy: Secondary | ICD-10-CM | POA: Diagnosis not present

## 2024-03-19 DIAGNOSIS — H52203 Unspecified astigmatism, bilateral: Secondary | ICD-10-CM | POA: Diagnosis not present

## 2024-03-19 DIAGNOSIS — H2513 Age-related nuclear cataract, bilateral: Secondary | ICD-10-CM | POA: Diagnosis not present

## 2024-03-22 ENCOUNTER — Encounter: Payer: Self-pay | Admitting: Nurse Practitioner

## 2024-03-22 ENCOUNTER — Ambulatory Visit: Attending: Nurse Practitioner | Admitting: Nurse Practitioner

## 2024-03-22 VITALS — BP 134/68 | HR 69 | Ht 70.0 in | Wt 297.8 lb

## 2024-03-22 DIAGNOSIS — I480 Paroxysmal atrial fibrillation: Secondary | ICD-10-CM

## 2024-03-22 DIAGNOSIS — E785 Hyperlipidemia, unspecified: Secondary | ICD-10-CM

## 2024-03-22 DIAGNOSIS — I2510 Atherosclerotic heart disease of native coronary artery without angina pectoris: Secondary | ICD-10-CM

## 2024-03-22 DIAGNOSIS — I5032 Chronic diastolic (congestive) heart failure: Secondary | ICD-10-CM

## 2024-03-22 DIAGNOSIS — G4733 Obstructive sleep apnea (adult) (pediatric): Secondary | ICD-10-CM | POA: Diagnosis not present

## 2024-03-22 DIAGNOSIS — Z8673 Personal history of transient ischemic attack (TIA), and cerebral infarction without residual deficits: Secondary | ICD-10-CM | POA: Diagnosis not present

## 2024-03-22 DIAGNOSIS — R0609 Other forms of dyspnea: Secondary | ICD-10-CM | POA: Diagnosis not present

## 2024-03-22 DIAGNOSIS — R053 Chronic cough: Secondary | ICD-10-CM

## 2024-03-22 DIAGNOSIS — I10 Essential (primary) hypertension: Secondary | ICD-10-CM | POA: Diagnosis not present

## 2024-03-22 MED ORDER — ISOSORBIDE MONONITRATE ER 30 MG PO TB24: 30.0000 mg | ORAL_TABLET | Freq: Every day | ORAL | 3 refills | Status: DC

## 2024-03-22 NOTE — Patient Instructions (Signed)
 Medication Instructions:  START IMDUR 30 MG TAKE ONE TABLET DAILY INCREASE TORSEMIDE  TAKE 40 MG FOR THREE DAYS THEN RESUME 20 MG DAILY  Lab Work: TODAY BNP, BMET   Follow-Up: At [facility], you and your health needs are our priority.  As part of our continuing mission to provide you with exceptional heart care, our providers are all part of one team.  This team includes your primary Cardiologist (physician) and Advanced Practice Providers or APPs (Physician Assistants and Nurse Practitioners) who all work together to provide you with the care you need, when you need it.  Your next appointment:   1 week(s)  Provider:   Katlyn West, NP       We recommend signing up for the patient portal called "MyChart".  Sign up information is provided on this After Visit Summary.  MyChart is used to connect with patients for Virtual Visits (Telemedicine).  Patients are able to view lab/test results, encounter notes, upcoming appointments, etc.  Non-urgent messages can be sent to your provider as well.   To learn more about what you can do with MyChart, go to [email or web address].

## 2024-03-22 NOTE — Progress Notes (Unsigned)
 Office Visit    Patient Name: Devon Mathews. Date of Encounter: 03/22/2024  Primary Care Provider:  Suzzanne Estrin, MD Primary Cardiologist:  Maudine Sos, MD  Chief Complaint    73 year old male with a history of CAD s/p DES-p-mLAD in 2019, s/p PTCA-D1 in 2020, paroxysmal atrial fibrillation, chronic diastolic heart failure, hypertension, hyperlipidemia, TIA, OSA, obesity and GERD, who presents for follow-up related to CAD.   Past Medical History    Past Medical History:  Diagnosis Date   Anxiety    Atrial fibrillation (HCC)    CAD S/P percutaneous coronary angioplasty 09/13/2018    99% p-mLAD (BTW d1&d2) -> SYNERGY DES 3.5 X 12 (3.75 mm). 20% LM.  Cx, small RI & co-dom RCA normal.  EF 65%.    Chronic diastolic heart failure (HCC) 11/30/2019   Congestive heart failure (CHF) (HCC)    Diabetes mellitus without complication (HCC)    Dizziness 2012   normal findings..   Essential hypertension 07/28/2016   GERD (gastroesophageal reflux disease)    Hyperlipidemia    Lower extremity edema 07/28/2016   NAFLD (nonalcoholic fatty liver disease)    NSTEMI (non-ST elevated myocardial infarction) (HCC) 09/13/2018   The patient presented 09/13/2018 with a non-ST elevation MI, troponin peak was 0.15   Obesity    Obesity (BMI 30-39.9) 07/28/2016   OSA (obstructive sleep apnea) 07/28/2016   Restless leg syndrome    mild   SCCA (squamous cell carcinoma) of skin 08/24/2021   Left Forehead (in situ-inflitrating)   Shortness of breath 07/28/2016   Sleep apnea    Squamous cell carcinoma of skin 03/20/2015   right ant scalp(curetx3, 64fu)   Squamous cell carcinoma of skin 01/01/2016   left lateral forehead (curetx3, 82fu)   Squamous cell carcinoma of skin 06/15/2019   left mid anterior scalp (tx afte bx)   Squamous cell carcinoma of skin 01/08/2021   well diff- left forehead   Squamous cell carcinoma of skin 01/08/2021   in situ- mid frontal scalp   TIA (transient ischemic  attack)    Tubular adenoma    Past Surgical History:  Procedure Laterality Date   COLONOSCOPY     CORONARY BALLOON ANGIOPLASTY N/A 12/05/2018   Procedure: CORONARY BALLOON ANGIOPLASTY;  Surgeon: Arleen Lacer, MD;  Location: [facility];  Service: Cardiovascular;  Laterality: N/A;   CORONARY STENT INTERVENTION N/A 09/13/2018   Procedure: CORONARY STENT INTERVENTION;  Surgeon: Arty Binning, MD;  Location: [facility];  Service: Cardiovascular;; p-m LAD (btw D1&D2) - SYNERGY DES 3.5 x 12 (3.75 mm).   ESOPHAGOGASTRODUODENOSCOPY (EGD) WITH PROPOFOL  N/A 03/01/2022   Procedure: ESOPHAGOGASTRODUODENOSCOPY (EGD) WITH PROPOFOL ;  Surgeon: Kenney Peacemaker, MD;  Location: [facility];  Service: Gastroenterology;  Laterality: N/A;  Polypectomy   GASTRIC VARICES BANDING  03/01/2022   Procedure: GASTRIC  BANDING;  Surgeon: Kenney Peacemaker, MD;  Location: [facility];  Service: Gastroenterology;;   KNEE ARTHROSCOPY Right    LEFT HEART CATH AND CORONARY ANGIOGRAPHY N/A 09/13/2018   Procedure: LEFT HEART CATH AND CORONARY ANGIOGRAPHY;  Surgeon: Arty Binning, MD;  Location: [facility];  Service: Cardiovascular;;  99% p-mLAD (BTW d1&d2) -> SYNERGY DES 3.5 X 12 (3.75 mm). 20% LM.  Cx, small RI & co-dom RCA normal.  EF 65%.    LEFT HEART CATH AND CORONARY ANGIOGRAPHY  2003   30-40% proximal segmental stenosis in the LAD   LEFT HEART CATH AND CORONARY ANGIOGRAPHY  2006  EF >50% & no mitral regurgitation   LEFT HEART CATH AND CORONARY ANGIOGRAPHY N/A 12/05/2018   Procedure: LEFT HEART CATH AND CORONARY ANGIOGRAPHY;  Surgeon: Arleen Lacer, MD;  Location: [facility] INVASIVE CV LAB;  Service: Cardiovascular;  Laterality: N/A;   LEFT HEART CATH AND CORONARY ANGIOGRAPHY N/A 08/17/2022   Procedure: LEFT HEART CATH AND CORONARY ANGIOGRAPHY;  Surgeon: Arnoldo Lapping, MD;  Location: [facility]AS[facility] CV LAB;  Service: Cardiovascular;  Laterality: N/A;   NM MYOVIEW  LTD  07/2016   Normal.  EF 55-65%  63%).  No ischemia or infarction.  LOW RISK   NM MYOVIEW  LTD  11/2018   6: 34 min.  7.2 METS.  Noted chest pain and tightness.  Horizontal ST of T wave depression (2 mm) II, 3, V5 and V6 (HIGH RISK), large/severe defect basal-apical inferior, inferolateral wall.  Large-severe basal-apical anterior-anterolateral wall.  Medium/moderate defect in basal and mid inferoseptal wall.  Consistent with large prior infarct with peri-infarct ischemia.  HIGH RISK   PERICARDIOCENTESIS N/A 12/05/2018   Procedure: PERICARDIOCENTESIS;  Surgeon: Arleen Lacer, MD;  Location: [facility] INVASIVE CV LAB;  Service: Cardiovascular;  Laterality: N/A;   SUBXYPHOID PERICARDIAL WINDOW N/A 12/25/2018   Procedure: SUBXYPHOID PERICARDIAL WINDOW;  Surgeon: Bartley Lightning, MD;  Location: [facility] OR;  Service: Thoracic;  Laterality: N/A;   TEE WITHOUT CARDIOVERSION N/A 12/25/2018   Procedure: TRANSESOPHAGEAL ECHOCARDIOGRAM (TEE);  Surgeon: Bartley Lightning, MD;  Location: [facility] OR;  Service: Thoracic;  Laterality: N/A;   tendon detachment Right    arm   TRANSTHORACIC ECHOCARDIOGRAM  07/2016   EF 60-65% with mild LVH.  Mild AI.  Trivial MR and TR.   VASECTOMY      Allergies  Allergies  Allergen Reactions   Penicillins Hives and Nausea And Vomiting    Did it involve swelling of the face/tongue/throat, SOB, or low BP? No Did it involve sudden or severe rash/hives, skin peeling, or any reaction on the inside of your mouth or nose? No Did you need to seek medical attention at a hospital or doctor's office? No When did it last happen?      30 + years If all above answers are "NO", may proceed with cephalosporin use.      Labs/Other Studies Reviewed    The following studies were reviewed today:  Cardiac Studies & Procedures   ______________________________________________________________________________________________ CARDIAC CATHETERIZATION  CARDIAC CATHETERIZATION 08/17/2022  Conclusion   Ost 1st Diag lesion is 30%  stenosed.  1.  Patent left main with no obstructive disease 2.  Patent LAD, including the stented segment in the proximal vessel, with continued patency of the diagonal branch that was intervened upon in 2020 3.  Patent circumflex with mild to moderate nonobstructive stenosis in the mid vessel after the first OM branch, does not appear flow-limiting 4.  Patent RCA without significant stenosis 5.  Normal LVEDP  Recommendations: Ongoing medical therapy for nonobstructive coronary artery disease  Findings Coronary Findings Diagnostic  Dominance: Right  Left Anterior Descending Prox LAD lesion is 30% stenosed. The lesion was previously treated . Mid LAD lesion is 30% stenosed.  First Diagonal Branch The diagonal branch, previously intervened upon, and is now occluded at its origin with no collateral flow present to visualize this vessel. Ost 1st Diag lesion is 30% stenosed.  Left Circumflex Prox Cx lesion is 50% stenosed.  Right Coronary Artery The vessel exhibits minimal luminal irregularities. Dominant vessel, minimal irregularity with no obstructive disease.  Supplies a PDA and PLA  branch.  Right Posterior Descending Artery The vessel exhibits minimal luminal irregularities.  Intervention  No interventions have been documented.   CARDIAC CATHETERIZATION  CARDIAC CATHETERIZATION 12/05/2018  Conclusion  Previously placed Prox LAD drug eluting stent is widely patent.  Ost 1st Diag lesion is 95% stenosed.  Scoring balloon angioplasty was performed using a BALLOON WOLVERINE 2.50X10.  Post intervention, there is a 20% residual stenosis.  The left ventricular systolic function is normal. The left ventricular ejection fraction is 55-65% by visual estimate.  Successful occlusion of distal diagonal branch with prolonged balloon inflations and protamine  infusion after initial wire related perforation of the distal diagonal branch.  1st Diag lesion is 100%  stenosed.  SUMMARY  Culprit lesion:  90% ostial 1st Diag (jailed) -successful scoring balloon angioplasty using 2.5 mm Wolverine scoring balloon  --Distal diagonal wire perforation with pericardial effusion and initial stages of pericardial tamponade --> successful occlusion with prolonged balloon inflations and administration of protamine   --Unsuccessful pericardiocentesis  Widely patent LAD stent  Otherwise stable coronary arteries.  Normal LVEF and EDP pre-PCI  Clear ST elevations for roughly 15 minutes, most likely type for MI.   RECOMMENDATIONS  Admit to CCU on Levophed  --continue Levophed  overnight  Monitor closely for signs of tamponade.  Follow troponins for likely type IV MI  Stat echocardiogram ordered.  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  Dr. Sherene Dilling from CT surgery is aware the patient & current condition  Findings Coronary Findings Diagnostic  Dominance: Right  Left Anterior Descending Previously placed Prox LAD drug eluting stent is widely patent.  First Diagonal Branch Ost 1st Diag lesion is 95% stenosed. The lesion is located at the bifurcation and eccentric. Jailed 1st Diag lesion is 100% stenosed. Distal diagonal branch occlusion after prolonged balloon angioplasty.  This was in response to wire perforation.  Intervention  Ost 1st Diag lesion Angioplasty Lesion length:  10 mm. CATH VISTA GUIDE 6FR XBLAD3.5 guide catheter was inserted. WIRE SION BLACK 190 guidewire used to cross lesion. Scoring balloon angioplasty was performed using a BALLOON WOLVERINE 2.50X10. Maximum pressure: 12 atm. Inflation time: 60 sec. WIRE SION Blue used for LAD protection Post-Intervention Lesion Assessment The intervention was successful. Pre-interventional TIMI flow is 3. Post-intervention TIMI flow is 3. Treated lesion length:  10  mm. At this lesion, a perforation of the vessel occurred. -Unfortunately, the side on black wire advanced too far distally with scoring balloon removal and this led to distal diagonal wire perforation. With post PTCA angiography revealing distal perforation, I used the 2.0 mm balloon that initially was used for predilation for prolonged inflations in the distal diagonal branch.  Initial attempts seem to be successful, however there was still blush.  The patient then became hypotensive with concerns for possible tamponade physiology.  He was started on IV Levophed  and given a 1000 mL bolus. The balloon was then reinserted with now prolonged inflations of 15, 15 and 20 minutes held.  After first prolonged inflation, there was still some blush, therefore protamine  was administered.  After the second do, no further blush was noted.  Of note, the patient did become quite symptomatic with the dropping of his blood pressure and then began to have significant chest pain with a prolonged balloon inflation on the second occasion. There is a 20% residual stenosis post intervention.   STRESS TESTS  MYOCARDIAL PERFUSION IMAGING 12/01/2018  Narrative  Nuclear stress EF: 54%.  The left ventricular ejection fraction is mildly decreased (45-54%).  Horizontal ST segment depression ST segment depression of 2 mm was noted during stress in the II, III, V5 and V6 leads, beginning at 8 minutes of stressST deviation beginning in recovery.  Defect 1: There is a large defect of severe severity present in the basal inferior, basal inferolateral, mid inferior, mid inferolateral, apical inferior, apical lateral and apex location.  Defect 2: There is a large defect of severe severity present in the basal anterior, basal anterolateral, mid anterior, mid anterolateral, apical anterior and apex location.  Defect 3: There is a medium defect of moderate severity present in the basal inferoseptal and mid inferoseptal location.   Findings consistent with ischemia and prior myocardial infarction with peri-infarct ischemia.  This is a high risk study.  There is a medium size, severe perfusion defect in the inferior and inferolateral walls with stress that is partially reversible at rest, returning to moderate. Base to apex. There is a absent, large perfusion defect in the anterior wall from base to apex. It is partially reversible at rest and returns to moderate at the base and mid ventricle and returns to mild at the apex. Anterolateral defect is severe, and with full reversibility. Medium size, moderate perfusion defect in the inferoseptum with partial reversibility, returning to mild at rest.  ECG positive for ischemia in recovery.  Chest pain and shortness of breath with peak stress.  High risk study. Consider coronary angiography if clinically indicated.   ECHOCARDIOGRAM  ECHOCARDIOGRAM COMPLETE 01/03/2024  Narrative ECHOCARDIOGRAM REPORT    Patient Name:   Devon Mathews. Date of Exam: 01/03/2024 Medical Rec #:  [id number]      Height:       69.0 in Accession #:    [id number]     Weight:       284.8 lb Date of Birth:  23-Dec-1950       BSA:          2.401 m Patient Age:    72 years       BP:           147/70 mmHg Patient Gender: M              HR:           73 bpm. Exam Location:  [address]  Procedure: 2D Echo, 3D Echo, Cardiac Doppler, Color Doppler and Strain Analysis (Both Spectral and Color Flow Doppler were utilized during procedure).  Indications:    Chest pain R07.9  History:        Patient has prior history of Echocardiogram examinations, most recent 03/27/2021. Diastolic heart failure, Previous Myocardial Infarction and CAD, TIA, Signs/Symptoms:Chest Pain; Risk Factors:morbid obesity, Family History of Coronary Artery Disease, Dyslipidemia, Sleep Apnea and Diabetes. Previous echo revealed LVEF 65% PAP 24.5 mmHg.  Sonographer:    Donnita Gales BA, RDCS Referring Phys: [phone] HAO  MENG  IMPRESSIONS   1. Left ventricular ejection fraction, by estimation, is 60 to 65%. Left ventricular ejection fraction by 3D volume is 67 %. The left ventricle has normal function. The left ventricle has no regional wall motion abnormalities. Left ventricular diastolic parameters were normal. The average left ventricular global longitudinal strain is -19.5 %. The global longitudinal strain is normal. 2. Right ventricular systolic function is normal. The right ventricular size is mildly enlarged. 3. Left atrial size was mildly dilated. 4. Right atrial size was moderately dilated. 5. The  mitral valve is normal in structure. Trivial mitral valve regurgitation. 6. The aortic valve is tricuspid. Aortic valve regurgitation is mild. No aortic stenosis is present. 7. The inferior vena cava is normal in size with greater than 50% respiratory variability, suggesting right atrial pressure of 3 mmHg.  Comparison(s): No significant change from prior study. Prior images reviewed side by side.  FINDINGS Left Ventricle: Left ventricular ejection fraction, by estimation, is 60 to 65%. Left ventricular ejection fraction by 3D volume is 67 %. The left ventricle has normal function. The left ventricle has no regional wall motion abnormalities. The average left ventricular global longitudinal strain is -19.5 %. Strain was performed and the global longitudinal strain is normal. The left ventricular internal cavity size was normal in size. There is no left ventricular hypertrophy. Left ventricular diastolic parameters were normal.  Right Ventricle: The right ventricular size is mildly enlarged. No increase in right ventricular wall thickness. Right ventricular systolic function is normal. The tricuspid regurgitant velocity is 2.96 m/s, and with an assumed right atrial pressure of 3 mmHg, the estimated right ventricular systolic pressure is 38.0 mmHg.  Left Atrium: Left atrial size was mildly dilated.  Right  Atrium: Right atrial size was moderately dilated.  Pericardium: There is no evidence of pericardial effusion.  Mitral Valve: The mitral valve is normal in structure. Trivial mitral valve regurgitation.  Tricuspid Valve: The tricuspid valve is normal in structure. Tricuspid valve regurgitation is mild.  Aortic Valve: The aortic valve is tricuspid. Aortic valve regurgitation is mild. No aortic stenosis is present.  Pulmonic Valve: The pulmonic valve was grossly normal. Pulmonic valve regurgitation is mild. No evidence of pulmonic stenosis.  Aorta: The aortic root and ascending aorta are structurally normal, with no evidence of dilitation.  Venous: The inferior vena cava is normal in size with greater than 50% respiratory variability, suggesting right atrial pressure of 3 mmHg.  IAS/Shunts: No atrial level shunt detected by color flow Doppler.  Additional Comments: 3D was performed not requiring image post processing on an independent workstation and was normal.   LEFT VENTRICLE PLAX 2D LVIDd:         5.47 cm         Diastology LVIDs:         3.18 cm         LV e' medial:    8.16 cm/s LV PW:         0.94 cm         LV E/e' medial:  10.9 LV IVS:        1.24 cm         LV e' lateral:   11.20 cm/s LVOT diam:     2.20 cm         LV E/e' lateral: 7.9 LV SV:         80 LV SV Index:   33              2D Longitudinal LVOT Area:     3.80 cm        Strain 2D Strain GLS   -20.1 % (A4C): 2D Strain GLS   -17.9 % (A3C): 2D Strain GLS   -20.4 % (A2C): 2D Strain GLS   -19.5 % Avg:  3D Volume EF LV 3D EF:    Left ventricul ar ejection fraction by 3D volume is 67 %.  3D Volume EF: 3D EF:        67 % LV EDV:  222 ml LV ESV:       74 ml LV SV:        148 ml  RIGHT VENTRICLE RV Basal diam:  4.45 cm RV Mid diam:    3.58 cm RV S prime:     16.90 cm/s TAPSE (M-mode): 2.2 cm RVSP:           38.0 mmHg  LEFT ATRIUM             Index        RIGHT ATRIUM           Index LA diam:         4.20 cm 1.75 cm/m   RA Pressure: 3.00 mmHg LA Vol (A2C):   66.7 ml 27.78 ml/m  RA Area:     23.80 cm LA Vol (A4C):   52.4 ml 21.83 ml/m  RA Volume:   78.30 ml  32.61 ml/m LA Biplane Vol: 59.6 ml 24.82 ml/m AORTIC VALVE LVOT Vmax:   113.00 cm/s LVOT Vmean:  76.200 cm/s LVOT VTI:    0.211 m  AORTA Ao Root diam: 3.20 cm Ao Asc diam:  3.60 cm  MV E velocity: 88.60 cm/s  TRICUSPID VALVE MV A velocity: 97.90 cm/s  TR Peak grad:   35.0 mmHg MV E/A ratio:  0.91        TR Vmax:        296.00 cm/s Estimated RAP:  3.00 mmHg RVSP:           38.0 mmHg  SHUNTS Systemic VTI:  0.21 m Systemic Diam: 2.20 cm  Luana Rumple MD Electronically signed by Luana Rumple MD Signature Date/Time: 01/03/2024/12:05:13 PM    Final          ______________________________________________________________________________________________     Recent Labs: 03/23/2023: ALT 28; BUN 20; Creatinine, Ser 1.12; Hemoglobin 13.1; Platelets 184.0; Potassium 4.4; Sodium 142  Recent Lipid Panel    Component Value Date/Time   CHOL 101 02/14/2023 0848   TRIG 99 02/14/2023 0848   HDL 34 (L) 02/14/2023 0848   CHOLHDL 2.5 01/07/2022 0910   CHOLHDL 3.0 12/21/2018 0528   VLDL 11 12/21/2018 0528   LDLCALC 48 02/14/2023 0848    History of Present Illness    73 year old male with the above past medical history including CAD s/p DES-p-mLAD in 2019, s/p PTCA-D1 in 2020, paroxysmal atrial fibrillation, chronic diastolic heart failure, hypertension, hyperlipidemia, TIA, OSA, obesity and GERD.   Cardiac catheterization in 2006 revealed mild nonobstructive CAD.  Subsequent Myoviews were low risk.  He was hospitalized in November 2019 in the setting of NSTEMI.  Cardiac catheterization revealed 95% pLAD stenosis s/p DES.  Repeat cardiac catheterization in 2020 revealed 90% ostial first diagonal stenosis (jailed in the setting of prior stent), s/p successful scoring balloon angioplasty, widely patent LAD stent.   Most recent cardiac catheterization in 2023 revealed patent LAD stent, continue patency of diagonal branch, patent LCx with mild to moderate nonobstructive stenosis in the mid vessel after the first OM branch, not flow-limiting, patent RCA without significant stenosis, normal LVEDP. Ongoing medical therapy for nonobstructive CAD was advised. Repeat echocardiogram in 12/2023 showed EF 60 to 65%, normal LV function, no RWMA, normal RV systolic function, moderately dilated right atrium, mild aortic valve regurgitation, no significant change from prior study. He was hospitalized in March 2025 at [facility] in the setting of chest pressure. Troponin was negative x 3. EKG was nonischemic. Chest x-ray was unremarkable.  Lexiscan Myoview  was negative for ischemia.  He was last seen in the office on 01/25/2024 and reported mild dyspnea on exertion, increased bilateral lower extremity edema, weight gain, mild orthopnea.  He was advised to take torsemide  20 mg daily x 5 days followed by torsemide  20 mg daily as needed.  He presents today for follow-up accompanied by his wife.  Since his last visit  Wt Readings from Last 3 Encounters:  03/22/24 297 lb 12.8 oz (135.1 kg)  01/25/24 290 lb 12.8 oz (131.9 kg)  01/02/24 284 lb 12.8 oz (129.2 kg)  He has continued to note significant dyspnea with minimal exertion.  He has had rare fleeting chest discomfort.  His weight is up.  He does have bilateral lower extremity edema, nonpitting.  He is concerned about his ongoing symptoms.  He denies palpitations, dizziness, PND, orthopnea.  EKG stable in office today.  He reports he has had multiple chest x-rays which have been unremarkable per patient report.  He does have a persistent barking cough.  He has not been taking his torsemide  regularly.  He noticed some mild improvement with increased torsemide  dosing.  However, since this time he has not been taking his torsemide  regularly.  This has been ongoing since he was treated for  bronchitis.  Will add Imdur 30 mg daily.  Will have him take torsemide  40 mg daily x 3 days followed by torsemide  20 mg daily.  Reviewed ED precautions.  Will check BNP, BMET today.  With plans for close follow-up.  Will reach out to Dr. Theodis Fiscal for further recommendations should if symptoms persist. Consider need for further ischemic evaluation (i.e. cath, consider referral to pulmonology).  He will need follow-up labs (BMET) in 1 week.  Follow-up in 1 week.  1. CAD: S/p DES-p-mLAD in 2019, s/p PTCA-D1 in 2020. Echo in 12/2023 showed EF 60 to 65%, normal LV function, no RWMA, normal RV systolic function, moderately dilated right atrium, mild aortic valve regurgitation, no significant change from prior study.  Cardiac PET stress test was ordered, but not yet completed. He was hospitalized in March 2025 at [facility] in the setting of chest pressure. Troponin was negative x 3. EKG was nonischemic. Chest x-ray was unremarkable.  Lexiscan Myoview  was negative for ischemia.  He denies any further chest pain. Through shared decision making, given recent reassuring echocardiogram, recent negative stress test will cancel cardiac PET stress test for now.  Continue to monitor symptoms.  Continue metoprolol , losartan , Plavix , and Lipitor .    2. Paroxysmal atrial fibrillation: Maintaining NSR.  He denies palpitations.  Continue metoprolol , Eliquis .   3. Chronic diastolic heart failure: Most recent echo stable as above. He reports mild dyspnea on exertion, increased bilateral lower extremity edema, weight gain, mild orthopnea.  He does have nonpitting bilateral lower extremity edema on exam. Advised him to take torsemide  20 mg daily x 5 days followed by torsemide  20 mg daily as needed for swelling, weight gain.  Will check BMET in 7 to 10 days.  Continue to monitor daily weights, discussed sodium and fluid recommendations.  Continue metoprolol , losartan .   4. Hypertension: BP well controlled. Continue current  antihypertensive regimen.    5. Hyperlipidemia: LDL was 48 in 12/2023.  Continue Lipitor .   6. History of TIA: No recurrence or residual.  Continue Plavix , Lipitor .   7. OSA: Adherent to CPAP.    8. Disposition: Follow-up in    Home Medications    Current Outpatient Medications  Medication Sig Dispense Refill   acetaminophen  (TYLENOL ) 500 MG tablet  Take 500-1,000 mg by mouth every 6 (six) hours as needed for moderate pain.     amitriptyline  (ELAVIL ) 25 MG tablet Take 0.5 tablets (12.5 mg total) by mouth at bedtime as needed for sleep. 30 tablet 0   amLODipine  (NORVASC ) 2.5 MG tablet Take 1 tablet (2.5 mg total) by mouth daily. 90 tablet 3   atorvastatin  (LIPITOR ) 80 MG tablet TAKE 1 TABLET BY MOUTH EVERY DAY 90 tablet 2   clopidogrel  (PLAVIX ) 75 MG tablet Take 1 tablet (75 mg total) by mouth daily. Please call [phone] to schedule a follow up visit. 90 tablet 1   ELIQUIS  5 MG TABS tablet Take 1 tablet (5 mg total) by mouth 2 (two) times daily. 180 tablet 0   escitalopram (LEXAPRO) 10 MG tablet Take 10 mg by mouth daily.     losartan  (COZAAR ) 50 MG tablet TAKE 1 TABLET BY MOUTH EVERY DAY 90 tablet 1   methocarbamol  (ROBAXIN ) 500 MG tablet Take 500 mg by mouth 3 (three) times daily as needed.     metoprolol  succinate (TOPROL -XL) 50 MG 24 hr tablet TAKE 1 TABLET BY MOUTH EVERY DAY WITH OR IMMEDIATELY FOLLOWING A MEAL (Patient taking differently: Take 50 mg by mouth daily.) 90 tablet 1   nitroGLYCERIN  (NITROSTAT ) 0.4 MG SL tablet Place 1 tablet (0.4 mg total) under the tongue every 5 (five) minutes as needed for chest pain. 25 tablet 3   pantoprazole  (PROTONIX ) 40 MG tablet TAKE ONE TABLET BY MOUTH ONE TIME DAILY. must make follow up appointment for further refills (Patient taking differently: Take 40 mg by mouth daily.) 15 tablet 0   potassium chloride  SA (KLOR-CON  M) 20 MEQ tablet Take 20 mEq by mouth daily as needed (when taking furosemide ).     predniSONE  (STERAPRED UNI-PAK 21 TAB) 5  MG (21) TBPK tablet Take 5 mg by mouth daily.     torsemide  (DEMADEX ) 20 MG tablet Take 1 tablet daily for 5 days, then take 1 tablet daily as needed for swelling or weight gain. 90 tablet 3   Current Facility-Administered Medications  Medication Dose Route Frequency Provider Last Rate Last Admin   sodium chloride  flush (NS) 0.9 % injection 3 mL  3 mL Intravenous Q12H Maudine Sos, MD         Review of Systems    ***.  All other systems reviewed and are otherwise negative except as noted above.    Physical Exam    VS:  BP 134/68   Pulse 69   Ht 5\' 10"  (1.778 m)   Wt 297 lb 12.8 oz (135.1 kg)   SpO2 95%   BMI 42.73 kg/m  GEN: Well nourished, well developed, in no acute distress. HEENT: normal. Neck: Supple, no JVD, carotid bruits, or masses. Cardiac: RRR, no murmurs, rubs, or gallops. No clubbing, cyanosis, edema.  Radials/DP/PT 2+ and equal bilaterally.  Respiratory:  Respirations regular and unlabored, clear to auscultation bilaterally. GI: Soft, nontender, nondistended, BS + x 4. MS: no deformity or atrophy. Skin: warm and dry, no rash. Neuro:  Strength and sensation are intact. Psych: Normal affect.  Accessory Clinical Findings    ECG personally reviewed by me today - EKG Interpretation Date/Time:  Thursday March 22 2024 07:46:06 EDT Ventricular Rate:  69 PR Interval:  206 QRS Duration:  96 QT Interval:  406 QTC Calculation: 435 R Axis:   47  Text Interpretation: Sinus rhythm with Premature supraventricular complexes When compared with ECG of 25-Jan-2024 13:21, Premature supraventricular complexes are now Present  Confirmed by Marlana Silvan ([id number]) on 03/22/2024 8:17:51 AM  - no acute changes.   Lab Results  Component Value Date   WBC 3.8 (L) 03/23/2023   HGB 13.1 03/23/2023   HCT 39.9 03/23/2023   MCV 95.9 03/23/2023   PLT 184.0 03/23/2023   Lab Results  Component Value Date   CREATININE 1.12 03/23/2023   BUN 20 03/23/2023   NA 142 03/23/2023   K 4.4  03/23/2023   CL 106 03/23/2023   CO2 24 03/23/2023   Lab Results  Component Value Date   ALT 28 03/23/2023   AST 26 03/23/2023   ALKPHOS 66 03/23/2023   BILITOT 0.6 03/23/2023   Lab Results  Component Value Date   CHOL 101 02/14/2023   HDL 34 (L) 02/14/2023   LDLCALC 48 02/14/2023   TRIG 99 02/14/2023   CHOLHDL 2.5 01/07/2022    Lab Results  Component Value Date   HGBA1C 6.0 (H) 02/14/2023    Assessment & Plan    1.  ***      Jude Norton, NP 03/22/2024, 8:18 AM

## 2024-03-23 ENCOUNTER — Other Ambulatory Visit: Payer: Self-pay

## 2024-03-23 ENCOUNTER — Encounter: Payer: Self-pay | Admitting: Nurse Practitioner

## 2024-03-23 MED ORDER — NITROGLYCERIN 0.4 MG SL SUBL: 0.4000 mg | SUBLINGUAL_TABLET | SUBLINGUAL | 3 refills | Status: DC | PRN

## 2024-03-26 ENCOUNTER — Other Ambulatory Visit: Payer: Self-pay

## 2024-03-26 LAB — BASIC METABOLIC PANEL WITH GFR
BUN/Creatinine Ratio: 12 (ref 10–24)
BUN: 14 mg/dL (ref 8–27)
CO2: 21 mmol/L (ref 20–29)
Calcium: 9.2 mg/dL (ref 8.6–10.2)
Chloride: 103 mmol/L (ref 96–106)
Creatinine, Ser: 1.14 mg/dL (ref 0.76–1.27)
Glucose: 116 mg/dL — ABNORMAL HIGH (ref 70–99)
Potassium: 5 mmol/L (ref 3.5–5.2)
Sodium: 139 mmol/L (ref 134–144)
eGFR: 68 mL/min/{1.73_m2} (ref >59)

## 2024-03-26 LAB — BRAIN NATRIURETIC PEPTIDE: BNP: 155 pg/mL — ABNORMAL HIGH (ref 0.0–100.0)

## 2024-03-27 ENCOUNTER — Ambulatory Visit: Admitting: Gastroenterology

## 2024-03-27 ENCOUNTER — Encounter: Payer: Self-pay | Admitting: Gastroenterology

## 2024-03-27 VITALS — BP 122/58 | HR 68 | Ht 69.0 in | Wt 292.1 lb

## 2024-03-27 DIAGNOSIS — K760 Fatty (change of) liver, not elsewhere classified: Secondary | ICD-10-CM | POA: Insufficient documentation

## 2024-03-27 DIAGNOSIS — R197 Diarrhea, unspecified: Secondary | ICD-10-CM | POA: Diagnosis not present

## 2024-03-27 DIAGNOSIS — R152 Fecal urgency: Secondary | ICD-10-CM

## 2024-03-27 NOTE — Progress Notes (Signed)
 03/27/2024 Glyndon Tursi [id number] 1951/04/18   HISTORY OF PRESENT ILLNESS:  This is a 73 year old male who is a patient of Dr. Jadene Maxwell.  He is here today for yearly follow-up of his fatty liver.  AST is 52, ALT 51, total bili 0.5, alk phos 79 on most recent labs in March.  He has actually gained weight since his last visit here.   His only complaint is that often times after eating breakfast in the morning he has bowel urgency often with loose stool/diarrhea.  This only occurs after breakfast.  He says that he and his wife often go out for breakfast in the mornings.  He does drink milk with breakfast and is curious if that could be causing it.  Ultrasound with elastography:  IMPRESSION: ULTRASOUND ABDOMEN:   Heterogeneously increased hepatic echogenicity with nodular contour, consistent with steatosis and cirrhosis.   Mild splenomegaly.   ULTRASOUND HEPATIC ELASTOGRAPHY:   Median kPa:  2.8   Diagnostic category:  < or = 5 kPa: high probability of being normal   EGD 03/01/22 Impression:               - Multiple gastric polyps. Ligated x 2 - both                            friable polyps w/ stigmata of bleeding 4 mm and 8                            mm. Previously seen and biopsied (hyperplastic).                            Also has fundic gland polyps w/o bleeding stigmata                            - no therapy needed.                           - The examination was otherwise normal.                           - No specimens collected.   COLONOSCOPY 12/04/21  TI erythema bx - non-specific ileitis 2 diminutive polyps colon adenomas O/w NL   Past Medical History:  Diagnosis Date   Anxiety    Atrial fibrillation (HCC)    CAD S/P percutaneous coronary angioplasty 09/13/2018    99% p-mLAD (BTW d1&d2) -> SYNERGY DES 3.5 X 12 (3.75 mm). 20% LM.  Cx, small RI & co-dom RCA normal.  EF 65%.    Chronic diastolic heart failure (HCC) 11/30/2019   Congestive heart failure  (CHF) (HCC)    Diabetes mellitus without complication (HCC)    Dizziness 2012   normal findings..   Essential hypertension 07/28/2016   GERD (gastroesophageal reflux disease)    Hyperlipidemia    Lower extremity edema 07/28/2016   NAFLD (nonalcoholic fatty liver disease)    NSTEMI (non-ST elevated myocardial infarction) (HCC) 09/13/2018   The patient presented 09/13/2018 with a non-ST elevation MI, troponin peak was 0.15   Obesity    Obesity (BMI 30-39.9) 07/28/2016   OSA (obstructive sleep apnea) 07/28/2016   Restless leg syndrome    mild   SCCA (  squamous cell carcinoma) of skin 08/24/2021   Left Forehead (in situ-inflitrating)   Shortness of breath 07/28/2016   Sleep apnea    Squamous cell carcinoma of skin 03/20/2015   right ant scalp(curetx3, 61fu)   Squamous cell carcinoma of skin 01/01/2016   left lateral forehead (curetx3, 94fu)   Squamous cell carcinoma of skin 06/15/2019   left mid anterior scalp (tx afte bx)   Squamous cell carcinoma of skin 01/08/2021   well diff- left forehead   Squamous cell carcinoma of skin 01/08/2021   in situ- mid frontal scalp   TIA (transient ischemic attack)    Tubular adenoma    Past Surgical History:  Procedure Laterality Date   COLONOSCOPY     CORONARY BALLOON ANGIOPLASTY N/A 12/05/2018   Procedure: CORONARY BALLOON ANGIOPLASTY;  Surgeon: Arleen Lacer, MD;  Location: [facility];  Service: Cardiovascular;  Laterality: N/A;   CORONARY STENT INTERVENTION N/A 09/13/2018   Procedure: CORONARY STENT INTERVENTION;  Surgeon: Arty Binning, MD;  Location: [facility];  Service: Cardiovascular;; p-m LAD (btw D1&D2) - SYNERGY DES 3.5 x 12 (3.75 mm).   ESOPHAGOGASTRODUODENOSCOPY (EGD) WITH PROPOFOL  N/A 03/01/2022   Procedure: ESOPHAGOGASTRODUODENOSCOPY (EGD) WITH PROPOFOL ;  Surgeon: Kenney Peacemaker, MD;  Location: [facility];  Service: Gastroenterology;  Laterality: N/A;  Polypectomy   GASTRIC VARICES BANDING  03/01/2022    Procedure: GASTRIC  BANDING;  Surgeon: Kenney Peacemaker, MD;  Location: [facility];  Service: Gastroenterology;;   KNEE ARTHROSCOPY Right    LEFT HEART CATH AND CORONARY ANGIOGRAPHY N/A 09/13/2018   Procedure: LEFT HEART CATH AND CORONARY ANGIOGRAPHY;  Surgeon: Arty Binning, MD;  Location: [facility];  Service: Cardiovascular;;  99% p-mLAD (BTW d1&d2) -> SYNERGY DES 3.5 X 12 (3.75 mm). 20% LM.  Cx, small RI & co-dom RCA normal.  EF 65%.    LEFT HEART CATH AND CORONARY ANGIOGRAPHY  2003   30-40% proximal segmental stenosis in the LAD   LEFT HEART CATH AND CORONARY ANGIOGRAPHY  2006   EF >50% & no mitral regurgitation   LEFT HEART CATH AND CORONARY ANGIOGRAPHY N/A 12/05/2018   Procedure: LEFT HEART CATH AND CORONARY ANGIOGRAPHY;  Surgeon: Arleen Lacer, MD;  Location: [facility] INVASIVE CV [facility];  Service: Cardiovascular;  Laterality: N/A;   LEFT HEART CATH AND CORONARY ANGIOGRAPHY N/A 08/17/2022   Procedure: LEFT HEART CATH AND CORONARY ANGIOGRAPHY;  Surgeon: Arnoldo Lapping, MD;  Location: [facility] IN[facility]ASIVE CV [facility];  Service: Cardiovascular;  Laterality: N/A;   NM MYOVIEW  LTD  07/2016   Normal.  EF 55-65% 63%).  No ischemia or infarction.  LOW RISK   NM MYOVIEW  LTD  11/2018   6: 34 min.  7.2 METS.  Noted chest pain and tightness.  Horizontal ST of T wave depression (2 mm) II, 3, V5 and V6 (HIGH RISK), large/severe defect basal-apical inferior, inferolateral wall.  Large-severe basal-apical anterior-anterolateral wall.  Medium/moderate defect in basal and mid inferoseptal wall.  Consistent with large prior infarct with peri-infarct ischemia.  HIGH RISK   PERICARDIOCENTESIS N/A 12/05/2018   Procedure: PERICARDIOCENTESIS;  Surgeon: Arleen Lacer, MD;  Location: [facility] INVASIVE CV LAB;  Service: Cardiovascular;  Laterality: N/A;   SUBXYPHOID PERICARDIAL WINDOW N/A 12/25/2018   Procedure: SUBXYPHOID PERICARDIAL WINDOW;  Surgeon: Bartley Lightning, MD;  Location: [facility];  Service: Thoracic;  Laterality: N/A;    TEE WITHOUT CARDIOVERSION N/A 12/25/2018   Procedure: TRANSESOPHAGEAL ECHOCARDIOGRAM (TEE);  Surgeon: Bartley Lightning, MD;  Location: [facility] OR;  Service: Thoracic;  Laterality: N/A;   tendon detachment Right    arm   TRANSTHORACIC ECHOCARDIOGRAM  07/2016   EF 60-65% with mild LVH.  Mild AI.  Trivial MR and TR.   VASECTOMY      reports that he has never smoked. He has never used smokeless tobacco. He reports that he does not drink alcohol  and does not use drugs. family history includes COPD in his father; Diabetes in his brother, mother, and sister; Heart attack in his paternal grandmother and sister; Heart disease in his father and mother; Heart failure in his father, mother, and sister; Hypertension in his mother; Other in his father and maternal grandfather; Pancreatic cancer in his paternal aunt and paternal uncle; Stroke in his father. Allergies  Allergen Reactions   Penicillins Hives and Nausea And Vomiting    Did it involve swelling of the face/tongue/throat, SOB, or low BP? No Did it involve sudden or severe rash/hives, skin peeling, or any reaction on the inside of your mouth or nose? No Did you need to seek medical attention at a hospital or doctor's office? No When did it last happen?      30 + years If all above answers are "NO", may proceed with cephalosporin use.       Outpatient Encounter Medications as of 03/27/2024  Medication Sig   acetaminophen  (TYLENOL ) 500 MG tablet Take 500-1,000 mg by mouth every 6 (six) hours as needed for moderate pain.   amitriptyline  (ELAVIL ) 25 MG tablet Take 0.5 tablets (12.5 mg total) by mouth at bedtime as needed for sleep.   amLODipine  (NORVASC ) 2.5 MG tablet Take 1 tablet (2.5 mg total) by mouth daily.   atorvastatin  (LIPITOR ) 80 MG tablet TAKE 1 TABLET BY MOUTH EVERY DAY   clopidogrel  (PLAVIX ) 75 MG tablet Take 1 tablet (75 mg total) by mouth daily. Please call [phone] to schedule a follow up visit.   ELIQUIS  5 MG TABS tablet Take 1  tablet (5 mg total) by mouth 2 (two) times daily.   escitalopram (LEXAPRO) 10 MG tablet Take 10 mg by mouth daily.   isosorbide  mononitrate (IMDUR ) 30 MG 24 hr tablet Take 1 tablet (30 mg total) by mouth daily.   losartan  (COZAAR ) 50 MG tablet TAKE 1 TABLET BY MOUTH EVERY DAY   methocarbamol  (ROBAXIN ) 500 MG tablet Take 500 mg by mouth 3 (three) times daily as needed.   metoprolol  succinate (TOPROL -XL) 50 MG 24 hr tablet TAKE 1 TABLET BY MOUTH EVERY DAY WITH OR IMMEDIATELY FOLLOWING A MEAL (Patient taking differently: Take 50 mg by mouth daily.)   nitroGLYCERIN  (NITROSTAT ) 0.4 MG SL tablet Place 1 tablet (0.4 mg total) under the tongue every 5 (five) minutes as needed for chest pain.   pantoprazole  (PROTONIX ) 40 MG tablet TAKE ONE TABLET BY MOUTH ONE TIME DAILY. must make follow up appointment for further refills (Patient taking differently: Take 40 mg by mouth daily.)   potassium chloride  SA (KLOR-CON  M) 20 MEQ tablet Take 20 mEq by mouth daily as needed (when taking furosemide ).   torsemide  (DEMADEX ) 20 MG tablet Take 1 tablet daily for 5 days, then take 1 tablet daily as needed for swelling or weight gain. (Patient taking differently: INCREASE TORSEMIDE  TAKE 40 MG FOR THREE DAYS THEN RESUME 20 MG DAILY)   [DISCONTINUED] predniSONE  (STERAPRED UNI-PAK 21 TAB) 5 MG (21) TBPK tablet Take 5 mg by mouth daily.   Facility-Administered Encounter Medications as of 03/27/2024  Medication   sodium  chloride flush (NS) 0.9 % injection 3 mL    REVIEW OF SYSTEMS  : All other systems reviewed and negative except where noted in the History of Present Illness.   PHYSICAL EXAM: BP (!) 122/58 (BP Location: Left Arm, Patient Position: Sitting, Cuff Size: Large)   Pulse 68   Ht 5\' 9"  (1.753 m)   Wt 292 lb 2 oz (132.5 kg)   BMI 43.14 kg/m  General: Well developed white male in no acute distress Head: Normocephalic and atraumatic Eyes:  Sclerae anicteric, conjunctiva pink. Ears: Normal auditory  acuity Lungs: Clear throughout to auscultation; no W/R/R. Heart: Regular rate and rhythm; no M/R/G. Musculoskeletal: Symmetrical with no gross deformities  Skin: No lesions on visible extremities Neurological: Alert oriented x 4, grossly non-focal Psychological:  Alert and cooperative. Normal mood and affect  ASSESSMENT AND PLAN: *Fatty liver/MAFLD: AST is 52, ALT 51, total bili 0.5, alk phos 79.  He has actually gained weight since his last visit here.  We discussed good diet, exercise, good blood sugar and cholesterol control, etc. *Loose stool with urgency: Only occurs in the mornings, typically after eating breakfast.  Says he and his wife go out for breakfast often.  He does drink milk with breakfast so is curious if it be from that, could be a lactose intolerance issue.  Discussed making some dietary changes, maybe first cutting out the milk and some other dairy products to see if this has an impact.  **Follow-up in 1 to 2 years as long as LFTs are being checked by PCP and remain fairly normal/stable or sooner if needed.   CC:  Tisovec, Kristina Pfeiffer, MD

## 2024-03-27 NOTE — Progress Notes (Unsigned)
 Cardiology Office Note    Date:  03/29/2024  ID:  Devon Men., DOB Apr 04, 1951, MRN [id number] PCP:  Suzzanne Estrin, MD  Cardiologist:  Maudine Sos, MD  Electrophysiologist:  None   Chief Complaint: Follow up for shortness of breath   History of Present Illness: .    Devon Mathews. is a 73 y.o. male with visit-pertinent history of CAD s/p DES to P-M LAD in 2019, s/p PTCA to D1 in 2020, paroxysmal atrial fibrillation, chronic diastolic heart failure, hypertension, hyperlipidemia, TIA, OSA, obesity and GERD.  Cardiac catheterization in 2006 revealed mild nonobstructive CAD.  Subsequent Myoview 's were low risk.  Patient was hospitalized in November 2019 in the setting of NSTEMI.  Cardiac catheterization revealed 95% pLAD stenosis s/p DES.  Repeat cardiac catheterization in 2020 revealed 90% ostial first diagonal stenosis (jailed in setting of prior stent), s/p successful scoring balloon angioplasty, widely patent LAD stent.  Most recent cardiac catheterization in 2023 revealed patent LAD stent, continued patency of diagonal branch, patent LCx with mild to moderate nonobstructive stenosis in the mid vessel after the first OM branch, not flow-limiting, patent RCA without significant stenosis, normal LVEDP.  Ongoing medical therapy for nonobstructive CAD was advised.  Repeat echo in 12/2023 showed EF 60 to 65%, normal LV function, no RWMA, normal RV systolic function, moderately dilated right atrium, mild aortic valve regurgitation, no significant change from prior study.  Patient was hospitalized in March 2025 at [facility] in setting of chest pressure.  Troponin was negative x 3, EKG was nonischemic.  Chest x-ray was unremarkable.  Lexiscan Myoview  was negative for ischemia.  Patient was seen in office on/9/25 and reported mild dyspnea on exertion, increased bilateral lower extremity edema, weight gain, mild orthopnea.  He was advised to take torsemide  20 mg daily for 5 days followed by 20 mg  daily as needed.  Patient was last seen in clinic on 03/22/2024 by Devon Silvan, NP.  Patient reported very mild improvement in shortness of breath with increased torsemide  dosing.  However patient reported he had not been taking torsemide  regularly.  He continued to report significant dyspnea with minimal exertion, as well as rare fleeting chest discomfort.  Patient was noted to have nonpitting bilateral lower extremity edema and his weight was up.  Patient also reported a persistent barking cough, he noted this had been ongoing since treated for bronchitis in March 2025.  Today he presents for follow up. He reports that he is doing better.  He reports that his shortness of breath has significantly improved since initiation of daily torsemide .  Patient reports that he did take Imdur  for 4 days following his office visit last week however discontinued the medication given worsening headaches, he has he was unable to tolerate.  Patient reports that he had significant weight loss at home with increased dose of torsemide .  He notes that even though his breathing has improved he does note concern with shortness of breath with ongoing exertion.  He denies any further chest pain, notes lower extremity edema has improved.  He denies any orthopnea or PND.  ROS: .   Today he denies chest pain, lower extremity edema, fatigue, palpitations, melena, hematuria, hemoptysis, diaphoresis, weakness, presyncope, syncope, orthopnea, and PND.  All other systems are reviewed and otherwise negative. Studies Reviewed: Devon Mathews   EKG:  EKG is not ordered today.  CV Studies: Cardiac studies reviewed are outlined and summarized above. Otherwise please see EMR for full report. Cardiac Studies &  Procedures   ______________________________________________________________________________________________ CARDIAC CATHETERIZATION  CARDIAC CATHETERIZATION 08/17/2022  Conclusion   Ost 1st Diag lesion is 30% stenosed.  1.  Patent left main  with no obstructive disease 2.  Patent LAD, including the stented segment in the proximal vessel, with continued patency of the diagonal branch that was intervened upon in 2020 3.  Patent circumflex with mild to moderate nonobstructive stenosis in the mid vessel after the first OM branch, does not appear flow-limiting 4.  Patent RCA without significant stenosis 5.  Normal LVEDP  Recommendations: Ongoing medical therapy for nonobstructive coronary artery disease  Findings Coronary Findings Diagnostic  Dominance: Right  Left Anterior Descending Prox LAD lesion is 30% stenosed. The lesion was previously treated . Mid LAD lesion is 30% stenosed.  First Diagonal Branch The diagonal branch, previously intervened upon, and is now occluded at its origin with no collateral flow present to visualize this vessel. Ost 1st Diag lesion is 30% stenosed.  Left Circumflex Prox Cx lesion is 50% stenosed.  Right Coronary Artery The vessel exhibits minimal luminal irregularities. Dominant vessel, minimal irregularity with no obstructive disease.  Supplies a PDA and PLA branch.  Right Posterior Descending Artery The vessel exhibits minimal luminal irregularities.  Intervention  No interventions have been documented.   CARDIAC CATHETERIZATION  CARDIAC CATHETERIZATION 12/05/2018  Conclusion  Previously placed Prox LAD drug eluting stent is widely patent.  Ost 1st Diag lesion is 95% stenosed.  Scoring balloon angioplasty was performed using a BALLOON WOLVERINE 2.50X10.  Post intervention, there is a 20% residual stenosis.  The left ventricular systolic function is normal. The left ventricular ejection fraction is 55-65% by visual estimate.  Successful occlusion of distal diagonal branch with prolonged balloon inflations and protamine  infusion after initial wire related perforation of the distal diagonal branch.  1st Diag lesion is 100% stenosed.  SUMMARY  Culprit lesion:  90% ostial  1st Diag (jailed) -successful scoring balloon angioplasty using 2.5 mm Wolverine scoring balloon  --Distal diagonal wire perforation with pericardial effusion and initial stages of pericardial tamponade --> successful occlusion with prolonged balloon inflations and administration of protamine   --Unsuccessful pericardiocentesis  Widely patent LAD stent  Otherwise stable coronary arteries.  Normal LVEF and EDP pre-PCI  Clear ST elevations for roughly 15 minutes, most likely type for MI.   RECOMMENDATIONS  Admit to CCU on Levophed  --continue Levophed  overnight  Monitor closely for signs of tamponade.  Follow troponins for likely type IV MI  Stat echocardiogram ordered.  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  Dr. Sherene Dilling from CT surgery is aware the patient & current condition  Findings Coronary Findings Diagnostic  Dominance: Right  Left Anterior Descending Previously placed Prox LAD drug eluting stent is widely patent.  First Diagonal Branch Ost 1st Diag lesion is 95% stenosed. The lesion is located at the bifurcation and eccentric. Jailed 1st Diag lesion is 100% stenosed. Distal diagonal branch occlusion after prolonged balloon angioplasty.  This was in response to wire perforation.  Intervention  Ost 1st Diag lesion Angioplasty Lesion length:  10 mm. CATH VISTA GUIDE 6FR XBLAD3.5 guide catheter was inserted. WIRE SION BLACK 190 guidewire used to cross lesion. Scoring balloon angioplasty was performed using a BALLOON WOLVERINE 2.50X10. Maximum pressure: 12 atm. Inflation time: 60 sec. WIRE SION Blue used for LAD protection Post-Intervention Lesion Assessment The intervention was successful. Pre-interventional TIMI flow is 3.  Post-intervention TIMI flow is 3. Treated lesion length:  10 mm. At this lesion, a perforation of the vessel  occurred. -Unfortunately, the side on black wire advanced too far distally with scoring balloon removal and this led to distal diagonal wire perforation. With post PTCA angiography revealing distal perforation, I used the 2.0 mm balloon that initially was used for predilation for prolonged inflations in the distal diagonal branch.  Initial attempts seem to be successful, however there was still blush.  The patient then became hypotensive with concerns for possible tamponade physiology.  He was started on IV Levophed  and given a 1000 mL bolus. The balloon was then reinserted with now prolonged inflations of 15, 15 and 20 minutes held.  After first prolonged inflation, there was still some blush, therefore protamine  was administered.  After the second do, no further blush was noted.  Of note, the patient did become quite symptomatic with the dropping of his blood pressure and then began to have significant chest pain with a prolonged balloon inflation on the second occasion. There is a 20% residual stenosis post intervention.   STRESS TESTS  MYOCARDIAL PERFUSION IMAGING 12/01/2018  Narrative  Nuclear stress EF: 54%.  The left ventricular ejection fraction is mildly decreased (45-54%).  Horizontal ST segment depression ST segment depression of 2 mm was noted during stress in the II, III, V5 and V6 leads, beginning at 8 minutes of stressST deviation beginning in recovery.  Defect 1: There is a large defect of severe severity present in the basal inferior, basal inferolateral, mid inferior, mid inferolateral, apical inferior, apical lateral and apex location.  Defect 2: There is a large defect of severe severity present in the basal anterior, basal anterolateral, mid anterior, mid anterolateral, apical anterior and apex location.  Defect 3: There is a medium defect of moderate severity present in the basal inferoseptal and mid inferoseptal location.  Findings consistent with ischemia and prior  myocardial infarction with peri-infarct ischemia.  This is a high risk study.  There is a medium size, severe perfusion defect in the inferior and inferolateral walls with stress that is partially reversible at rest, returning to moderate. Base to apex. There is a absent, large perfusion defect in the anterior wall from base to apex. It is partially reversible at rest and returns to moderate at the base and mid ventricle and returns to mild at the apex. Anterolateral defect is severe, and with full reversibility. Medium size, moderate perfusion defect in the inferoseptum with partial reversibility, returning to mild at rest.  ECG positive for ischemia in recovery.  Chest pain and shortness of breath with peak stress.  High risk study. Consider coronary angiography if clinically indicated.   ECHOCARDIOGRAM  ECHOCARDIOGRAM COMPLETE 01/03/2024  Narrative ECHOCARDIOGRAM REPORT    Patient Name:   Devon Mathews. Date of Exam: 01/03/2024 Medical Rec #:  [id number]      Height:       69.0 in Accession #:    [id number]     Weight:       284.8 lb Date of Birth:  [birth year]       BSA:          2.401 m Patient Age:    72 years       BP:           147/70 mmHg Patient Gender: M              HR:  73 bpm. Exam Location:  [address]  Procedure: 2D Echo, 3D Echo, Cardiac Doppler, Color Doppler and Strain Analysis (Both Spectral and Color Flow Doppler were utilized during procedure).  Indications:    Chest pain R07.9  History:        Patient has prior history of Echocardiogram examinations, most recent 03/27/2021. Diastolic heart failure, Previous Myocardial Infarction and CAD, TIA, Signs/Symptoms:Chest Pain; Risk Factors:morbid obesity, Family History of Coronary Artery Disease, Dyslipidemia, Sleep Apnea and Diabetes. Previous echo revealed LVEF 65% PAP 24.5 mmHg.  Sonographer:    Donnita Gales BA, RDCS Referring Phys: [phone] HAO MENG  IMPRESSIONS   1. Left ventricular  ejection fraction, by estimation, is 60 to 65%. Left ventricular ejection fraction by 3D volume is 67 %. The left ventricle has normal function. The left ventricle has no regional wall motion abnormalities. Left ventricular diastolic parameters were normal. The average left ventricular global longitudinal strain is -19.5 %. The global longitudinal strain is normal. 2. Right ventricular systolic function is normal. The right ventricular size is mildly enlarged. 3. Left atrial size was mildly dilated. 4. Right atrial size was moderately dilated. 5. The mitral valve is normal in structure. Trivial mitral valve regurgitation. 6. The aortic valve is tricuspid. Aortic valve regurgitation is mild. No aortic stenosis is present. 7. The inferior vena cava is normal in size with greater than 50% respiratory variability, suggesting right atrial pressure of 3 mmHg.  Comparison(s): No significant change from prior study. Prior images reviewed side by side.  FINDINGS Left Ventricle: Left ventricular ejection fraction, by estimation, is 60 to 65%. Left ventricular ejection fraction by 3D volume is 67 %. The left ventricle has normal function. The left ventricle has no regional wall motion abnormalities. The average left ventricular global longitudinal strain is -19.5 %. Strain was performed and the global longitudinal strain is normal. The left ventricular internal cavity size was normal in size. There is no left ventricular hypertrophy. Left ventricular diastolic parameters were normal.  Right Ventricle: The right ventricular size is mildly enlarged. No increase in right ventricular wall thickness. Right ventricular systolic function is normal. The tricuspid regurgitant velocity is 2.96 m/s, and with an assumed right atrial pressure of 3 mmHg, the estimated right ventricular systolic pressure is 38.0 mmHg.  Left Atrium: Left atrial size was mildly dilated.  Right Atrium: Right atrial size was moderately  dilated.  Pericardium: There is no evidence of pericardial effusion.  Mitral Valve: The mitral valve is normal in structure. Trivial mitral valve regurgitation.  Tricuspid Valve: The tricuspid valve is normal in structure. Tricuspid valve regurgitation is mild.  Aortic Valve: The aortic valve is tricuspid. Aortic valve regurgitation is mild. No aortic stenosis is present.  Pulmonic Valve: The pulmonic valve was grossly normal. Pulmonic valve regurgitation is mild. No evidence of pulmonic stenosis.  Aorta: The aortic root and ascending aorta are structurally normal, with no evidence of dilitation.  Venous: The inferior vena cava is normal in size with greater than 50% respiratory variability, suggesting right atrial pressure of 3 mmHg.  IAS/Shunts: No atrial level shunt detected by color flow Doppler.  Additional Comments: 3D was performed not requiring image post processing on an independent workstation and was normal.   LEFT VENTRICLE PLAX 2D LVIDd:         5.47 cm         Diastology LVIDs:         3.18 cm         LV e' medial:  8.16 cm/s LV PW:         0.94 cm         LV E/e' medial:  10.9 LV IVS:        1.24 cm         LV e' lateral:   11.20 cm/s LVOT diam:     2.20 cm         LV E/e' lateral: 7.9 LV SV:         80 LV SV Index:   33              2D Longitudinal LVOT Area:     3.80 cm        Strain 2D Strain GLS   -20.1 % (A4C): 2D Strain GLS   -17.9 % (A3C): 2D Strain GLS   -20.4 % (A2C): 2D Strain GLS   -19.5 % Avg:  3D Volume EF LV 3D EF:    Left ventricul ar ejection fraction by 3D volume is 67 %.  3D Volume EF: 3D EF:        67 % LV EDV:       222 ml LV ESV:       74 ml LV SV:        148 ml  RIGHT VENTRICLE RV Basal diam:  4.45 cm RV Mid diam:    3.58 cm RV S prime:     16.90 cm/s TAPSE (M-mode): 2.2 cm RVSP:           38.0 mmHg  LEFT ATRIUM             Index        RIGHT ATRIUM           Index LA diam:        4.20 cm 1.75 cm/m   RA Pressure:  3.00 mmHg LA Vol (A2C):   66.7 ml 27.78 ml/m  RA Area:     23.80 cm LA Vol (A4C):   52.4 ml 21.83 ml/m  RA Volume:   78.30 ml  32.61 ml/m LA Biplane Vol: 59.6 ml 24.82 ml/m AORTIC VALVE LVOT Vmax:   113.00 cm/s LVOT Vmean:  76.200 cm/s LVOT VTI:    0.211 m  AORTA Ao Root diam: 3.20 cm Ao Asc diam:  3.60 cm  MV E velocity: 88.60 cm/s  TRICUSPID VALVE MV A velocity: 97.90 cm/s  TR Peak grad:   35.0 mmHg MV E/A ratio:  0.91        TR Vmax:        296.00 cm/s Estimated RAP:  3.00 mmHg RVSP:           38.0 mmHg  SHUNTS Systemic VTI:  0.21 m Systemic Diam: 2.20 cm  Devon Paget Croitoru MD Electronically signed by Devon Rumple MD Signature Date/Time: 01/03/2024/12:05:13 PM    Final          ______________________________________________________________________________________________       Current Reported Medications:.    Current Meds  Medication Sig   acetaminophen  (TYLENOL ) 500 MG tablet Take 500-1,000 mg by mouth every 6 (six) hours as needed for moderate pain.   amitriptyline  (ELAVIL ) 25 MG tablet Take 0.5 tablets (12.5 mg total) by mouth at bedtime as needed for sleep.   amLODipine  (NORVASC ) 2.5 MG tablet Take 1 tablet (2.5 mg total) by mouth daily.   atorvastatin  (LIPITOR ) 80 MG tablet TAKE 1 TABLET BY MOUTH EVERY DAY   clopidogrel  (PLAVIX ) 75 MG tablet Take 1 tablet (75 mg total)  by mouth daily. Please call [phone] to schedule a follow up visit.   ELIQUIS  5 MG TABS tablet Take 1 tablet (5 mg total) by mouth 2 (two) times daily.   escitalopram (LEXAPRO) 10 MG tablet Take 10 mg by mouth daily.   losartan  (COZAAR ) 50 MG tablet TAKE 1 TABLET BY MOUTH EVERY DAY   melatonin 5 MG TABS Take 5 mg by mouth at bedtime.   methocarbamol  (ROBAXIN ) 500 MG tablet Take 500 mg by mouth 3 (three) times daily as needed.   metoprolol  succinate (TOPROL -XL) 50 MG 24 hr tablet TAKE 1 TABLET BY MOUTH EVERY DAY WITH OR IMMEDIATELY FOLLOWING A MEAL (Patient taking differently: Take 50  mg by mouth daily.)   pantoprazole  (PROTONIX ) 40 MG tablet TAKE ONE TABLET BY MOUTH ONE TIME DAILY. must make follow up appointment for further refills (Patient taking differently: Take 40 mg by mouth daily.)   potassium chloride  SA (KLOR-CON  M) 20 MEQ tablet Take 20 mEq by mouth daily as needed (when taking furosemide ).   torsemide  (DEMADEX ) 20 MG tablet Take 1 tablet daily for 5 days, then take 1 tablet daily as needed for swelling or weight gain. (Patient taking differently: INCREASE TORSEMIDE  TAKE 40 MG FOR THREE DAYS THEN RESUME 20 MG DAILY)   [DISCONTINUED] nitroGLYCERIN  (NITROSTAT ) 0.4 MG SL tablet Place 1 tablet (0.4 mg total) under the tongue every 5 (five) minutes as needed for chest pain.   Current Facility-Administered Medications for the 03/29/24 encounter (Office Visit) with Devon Mathews D, NP  Medication   sodium chloride  flush (NS) 0.9 % injection 3 mL    Physical Exam:    VS:  BP (!) 124/58   Pulse 65   Ht 5' 9 (1.753 m)   Wt 292 lb 6.4 oz (132.6 kg)   SpO2 95%   BMI 43.18 kg/m    Wt Readings from Last 3 Encounters:  03/29/24 292 lb 6.4 oz (132.6 kg)  03/27/24 292 lb 2 oz (132.5 kg)  03/22/24 297 lb 12.8 oz (135.1 kg)    GEN: Well nourished, well developed in no acute distress NECK: No JVD; No carotid bruits CARDIAC: RRR, no murmurs, rubs, gallops RESPIRATORY:  Clear to auscultation without rales, wheezing or rhonchi  ABDOMEN: Soft, non-tender, non-distended EXTREMITIES:  No edema; No acute deformity     Asessement and Plan:.    CAD/DOE/Chronic diastolic HF/persistent cough: S/p DES to P-M LAD in 2019, s/p PTCA-D1 in 2020.  Echo in 12/2023 showed EF 60 to 65%, normal LV function, no RWMA, normal RV systolic function, moderately dilated right atrium, mild aortic valve regurgitation, no significant change in prior study.  Cardiac PET stress was ordered in setting of intermittent chest discomfort dyspnea on exertion however this was not completed as he was hospitalized  in March 2025 at [facility] in setting of chest pressure.  Troponin was negative x 3 and EKG was nonischemic.  Chest x-ray was unremarkable.  Lexiscan Myoview  was negative for ischemia. At office visit last week he continued to report dyspnea with minimal exertion, fleeting chest discomfort.  He noted very mild improvement in his shortness of breath with increase torsemide  dosing however had not been taking his torsemide  regularly.  He had nonpitting bilateral lower extremity edema and his weight was up.  Patient also reported persistent barking cough that have been ongoing since treated for bronchitis in March 2025.  He noted multiple chest x-rays per his PCP that had been unremarkable per patient report.  He was started on  Imdur  30 mg daily and instructed to take torsemide  40 mg daily for 3 days followed by torsemide  20 mg daily with plans for close follow-up.  Dr. Theodis Fiscal recommended patient undergo cardiopulmonary exercise testing.  Today he reports significant improvement in his breathing, he is no longer significantly short of breath with exertion however he does continue to note dyspnea with ongoing exertion, he would like to pursue CPX as recommended by Dr. Theodis Fiscal.  He denies any further chest pain.  Discussed risks and benefits of CPX with patient, he is agreeable to proceed.  Check BMET and BNP today. Continue Plavix , metoprolol , losartan , amlodipine  and Lipitor .   Paroxysmal atrial fibrillation: Maintaining normal sinus rhythm, denies any significant palpitations.  Denies any significant bleeding problems. Continue metoprolol  and Eliquis .  Hypertension: Blood pressure today 122/50, on recheck was 124/58. Continue current antihypertensive regimen.  Hyperlipidemia: LDL was 48 in 12/2023.  Continue Lipitor .   History of TIA: No recurrence or residual, continue Plavix  and Lipitor .  OSA: Patient reports adherence to CPAP.    Disposition: F/u with Devon Guerin, NP in six weeks. Dr. Theodis Fiscal in  4-5 months.   Signed, Devon Ruzich Mathews Samamtha Tiegs, NP

## 2024-03-27 NOTE — Patient Instructions (Signed)
 Continue to make dietary changes to see if you are able to identify source of diarrhea.   Follow up in 1-2 years or sooner if needed.  _______________________________________________________  If your blood pressure at your visit was 140/90 or greater, please contact your primary care physician to follow up on this.  _______________________________________________________  If you are age 72 or older, your body mass index should be between 23-30. Your Body mass index is 43.14 kg/m. If this is out of the aforementioned range listed, please consider follow up with your Primary Care Provider.  If you are age 60 or younger, your body mass index should be between 19-25. Your Body mass index is 43.14 kg/m. If this is out of the aformentioned range listed, please consider follow up with your Primary Care Provider.   ________________________________________________________  The [place] [facility] providers would like to encourage you to use MYCHART to communicate with providers for non-urgent requests or questions.  Due to long hold times on the telephone, sending your provider a message by [facility] may be a faster and more efficient way to get a response.  Please allow 48 business hours for a response.  Please remember that this is for non-urgent requests.  _______________________________________________________

## 2024-03-29 ENCOUNTER — Ambulatory Visit: Attending: Cardiology | Admitting: Cardiology

## 2024-03-29 ENCOUNTER — Ambulatory Visit: Payer: Self-pay | Admitting: Nurse Practitioner

## 2024-03-29 ENCOUNTER — Telehealth: Payer: Self-pay

## 2024-03-29 ENCOUNTER — Encounter: Payer: Self-pay | Admitting: Cardiology

## 2024-03-29 VITALS — BP 124/58 | HR 65 | Ht 69.0 in | Wt 292.4 lb

## 2024-03-29 DIAGNOSIS — E785 Hyperlipidemia, unspecified: Secondary | ICD-10-CM | POA: Diagnosis not present

## 2024-03-29 DIAGNOSIS — I10 Essential (primary) hypertension: Secondary | ICD-10-CM | POA: Diagnosis not present

## 2024-03-29 DIAGNOSIS — I5032 Chronic diastolic (congestive) heart failure: Secondary | ICD-10-CM

## 2024-03-29 DIAGNOSIS — I480 Paroxysmal atrial fibrillation: Secondary | ICD-10-CM

## 2024-03-29 DIAGNOSIS — Z8673 Personal history of transient ischemic attack (TIA), and cerebral infarction without residual deficits: Secondary | ICD-10-CM | POA: Diagnosis not present

## 2024-03-29 DIAGNOSIS — R0609 Other forms of dyspnea: Secondary | ICD-10-CM

## 2024-03-29 DIAGNOSIS — G4733 Obstructive sleep apnea (adult) (pediatric): Secondary | ICD-10-CM

## 2024-03-29 DIAGNOSIS — R053 Chronic cough: Secondary | ICD-10-CM

## 2024-03-29 DIAGNOSIS — I2510 Atherosclerotic heart disease of native coronary artery without angina pectoris: Secondary | ICD-10-CM | POA: Diagnosis not present

## 2024-03-29 MED ORDER — NITROGLYCERIN 0.4 MG SL SUBL: 0.4000 mg | SUBLINGUAL_TABLET | SUBLINGUAL | 3 refills | Status: AC | PRN

## 2024-03-29 NOTE — Patient Instructions (Addendum)
 Medication Instructions:  Your physician has recommended you make the following change in your medication:   ** Stop Imdur   *If you need a refill on your cardiac medications before your next appointment, please call your pharmacy*  Lab Work: BMET and BNP today  If you have labs (blood work) drawn today and your tests are completely normal, you will receive your results only by: MyChart Message (if you have MyChart) OR A paper copy in the mail If you have any lab test that is abnormal or we need to change your treatment, we will call you to review the results.  Testing/Procedures: Your physician has recommended that you have a cardiopulmonary stress test (CPX). CPX testing is a non-invasive measurement of heart and lung function. It replaces a traditional treadmill stress test. This type of test provides a tremendous amount of information that relates not only to your present condition but also for future outcomes. This test combines measurements of you ventilation, respiratory gas exchange in the lungs, electrocardiogram (EKG), blood pressure and physical response before, during, and following an exercise protocol.   Follow-Up: At [facility], you and your health needs are our priority.  As part of our continuing mission to provide you with exceptional heart care, our providers are all part of one team.  This team includes your primary Cardiologist (physician) and Advanced Practice Providers or APPs (Physician Assistants and Nurse Practitioners) who all work together to provide you with the care you need, when you need it.  Your next appointment:   6 weeks with Devon West, NP  5 months with Dr Devon Mathews  We recommend signing up for the patient portal called MyChart.  Sign up information is provided on this After Visit Summary.  MyChart is used to connect with patients for Virtual Visits (Telemedicine).  Patients are able to view lab/test results, encounter notes, upcoming  appointments, etc.  Non-urgent messages can be sent to your provider as well.   To learn more about what you can do with MyChart, go to [email or web address].

## 2024-03-29 NOTE — Telephone Encounter (Signed)
 Zehr, Jessica D, PA-C  P Lbgi Pod C Triage Please at the patient know that Dr. Willy Harvest would like him to have a repeat ultrasound with elastography.  Please schedule if he is agreeable.  Thank you,  Jess

## 2024-03-29 NOTE — Telephone Encounter (Signed)
 Line rings then goes fast busy  Will try again later

## 2024-03-30 ENCOUNTER — Ambulatory Visit: Payer: Self-pay | Admitting: Nurse Practitioner

## 2024-03-30 LAB — BRAIN NATRIURETIC PEPTIDE: BNP: 105.1 pg/mL — ABNORMAL HIGH (ref 0.0–100.0)

## 2024-03-30 NOTE — Telephone Encounter (Signed)
 Left message on machine to call back

## 2024-03-31 ENCOUNTER — Ambulatory Visit: Payer: Self-pay | Admitting: Cardiology

## 2024-03-31 ENCOUNTER — Other Ambulatory Visit (HOSPITAL_BASED_OUTPATIENT_CLINIC_OR_DEPARTMENT_OTHER): Payer: Self-pay | Admitting: Cardiovascular Disease

## 2024-03-31 DIAGNOSIS — Z79899 Other long term (current) drug therapy: Secondary | ICD-10-CM

## 2024-03-31 DIAGNOSIS — R0602 Shortness of breath: Secondary | ICD-10-CM

## 2024-04-02 NOTE — Telephone Encounter (Signed)
Unable to reach pt by phone letter mailed.  

## 2024-04-03 MED ORDER — TORSEMIDE 20 MG PO TABS: 20.0000 mg | ORAL_TABLET | Freq: Every day | ORAL | 3 refills | Status: AC

## 2024-04-03 NOTE — Telephone Encounter (Signed)
-----   Message from Ellie Gutta [place] sent at 03/31/2024  7:34 PM EDT ----- Please let Mr. Karaffa know that his BNP has improved, recommend continuing torsemide  20 mg daily. He will need an updated prescription. Thanks  ----- Message ----- From: Garner Jury Lab Results In Sent: 03/30/2024  12:36 PM EDT To: Katlyn D West, NP

## 2024-04-03 NOTE — Telephone Encounter (Signed)
 Called patient advised of below they verbalized understanding Confirmed pharmacy and ordered meds.

## 2024-04-11 ENCOUNTER — Other Ambulatory Visit (HOSPITAL_BASED_OUTPATIENT_CLINIC_OR_DEPARTMENT_OTHER): Payer: Self-pay | Admitting: Cardiovascular Disease

## 2024-04-16 ENCOUNTER — Ambulatory Visit: Admitting: Nurse Practitioner

## 2024-05-03 DIAGNOSIS — H2511 Age-related nuclear cataract, right eye: Secondary | ICD-10-CM | POA: Diagnosis not present

## 2024-05-03 DIAGNOSIS — Z961 Presence of intraocular lens: Secondary | ICD-10-CM | POA: Diagnosis not present

## 2024-05-03 DIAGNOSIS — H02811 Retained foreign body in right upper eyelid: Secondary | ICD-10-CM | POA: Diagnosis not present

## 2024-05-08 ENCOUNTER — Ambulatory Visit (HOSPITAL_COMMUNITY): Attending: Cardiology

## 2024-05-08 DIAGNOSIS — R0609 Other forms of dyspnea: Secondary | ICD-10-CM | POA: Insufficient documentation

## 2024-05-08 DIAGNOSIS — I5032 Chronic diastolic (congestive) heart failure: Secondary | ICD-10-CM | POA: Diagnosis not present

## 2024-05-10 DIAGNOSIS — R0600 Dyspnea, unspecified: Secondary | ICD-10-CM | POA: Diagnosis not present

## 2024-05-13 NOTE — Progress Notes (Unsigned)
 Cardiology Office Note    Date:  05/15/2024  ID:  Devon Mathews., DOB August 04, 1951, MRN [id number] PCP:  Vernadine Charlie ORN, MD  Cardiologist:  Annabella Scarce, MD  Electrophysiologist:  None   Chief Complaint: Follow up for DOE   History of Present Illness: .    Devon Polhamus. is a 73 y.o. male with visit-pertinent history of CAD s/p DES to P-M LAD in 2019, s/p PTCA to D1 in 2020, paroxysmal atrial fibrillation, chronic diastolic heart failure, hypertension, hyperlipidemia, TIA, OSA, obesity and GERD.  Cardiac catheterization in 2006 revealed mild nonobstructive CAD.  Subsequent Myoview 's were low risk.  Patient was hospitalized in November 2019 in the setting of NSTEMI.  Cardiac catheterization revealed 95% pLAD stenosis s/p DES.  Repeat cardiac catheterization in 2020 revealed 90% ostial first diagonal stenosis (jailed in setting of prior stent), s/p successful scoring balloon angioplasty, widely patent LAD stent.  Most recent cardiac catheterization in 2023 revealed patent LAD stent, continued patency of diagonal branch, patent LCx with mild to moderate nonobstructive stenosis in the mid vessel after the first OM branch, not flow-limiting, patent RCA without significant stenosis, normal LVEDP.  Ongoing medical therapy for nonobstructive CAD was advised.  Repeat echo in 12/2023 showed EF 60 to 65%, normal LV function, no RWMA, normal RV systolic function, moderately dilated right atrium, mild aortic valve regurgitation, no significant change from prior study.  Patient was hospitalized in March 2025 at [facility] health in setting of chest pressure.  Troponin was negative x 3, EKG was nonischemic.  Chest x-ray was unremarkable.  Lexiscan  Myoview  was negative for ischemia.  Patient was seen in office on/9/25 and reported mild dyspnea on exertion, increased bilateral lower extremity edema, weight gain, mild orthopnea.  He was advised to take torsemide  20 mg daily for 5 days followed by 20 mg daily as  needed.  Patient was seen in clinic on 03/22/2024 by Damien Braver, NP.  Patient reported very mild improvement in shortness of breath with increased torsemide  dosing.  However patient reported he had not been taking torsemide  regularly.  He continued to report significant dyspnea with minimal exertion, as well as rare fleeting chest discomfort.  Patient was noted to have nonpitting bilateral lower extremity edema and his weight was up.  Patient also reported a persistent barking cough, he noted this had been ongoing since treated for bronchitis in March 2025.  Patient was last seen in clinic on 03/29/2024 for follow-up.  He reported that he was doing significantly better.  He reported shortness of breath had significantly improved since initiation of daily torsemide .  He tried Imdur  for 4 days following his last visit however discontinued medication given worsening headaches, was unable to tolerate.  Patient reports he has significant weight loss at home with increased dose of torsemide .  It was recommended by Dr. Scarce that patient undergo CPX.  Today patient presents for follow-up.  He reports that he is doing well overall.  He denies any chest pain, continues to note dyspnea with exertion.  Unfortunately his CPX result has not yet been finalized, unable to completely review today at visit.  Patient reports he has noted improvement while on increased doses of torsemide , denies any current lower extremity edema.  Denies any palpitations, orthopnea or PND.  Patient does note a cough when he lays on his back and turns on his left side, is not present on his right side, question possible acid reflux.  Patient also reports adherence with his  CPAP device however reports that he has not had a CPAP titration since 2020.  ROS: .   Today he denies chest pain, fatigue, palpitations, melena, hematuria, hemoptysis, diaphoresis, weakness, presyncope, syncope, orthopnea, and PND.  All other systems are reviewed and  otherwise negative. Studies Reviewed: SABRA   EKG:  EKG is not ordered today.  CV Studies: Cardiac studies reviewed are outlined and summarized above. Otherwise please see EMR for full report. Cardiac Studies & Procedures   ______________________________________________________________________________________________ CARDIAC CATHETERIZATION  CARDIAC CATHETERIZATION 08/17/2022  Conclusion   Ost 1st Diag lesion is 30% stenosed.  1.  Patent left main with no obstructive disease 2.  Patent LAD, including the stented segment in the proximal vessel, with continued patency of the diagonal branch that was intervened upon in 2020 3.  Patent circumflex with mild to moderate nonobstructive stenosis in the mid vessel after the first OM branch, does not appear flow-limiting 4.  Patent RCA without significant stenosis 5.  Normal LVEDP  Recommendations: Ongoing medical therapy for nonobstructive coronary artery disease  Findings Coronary Findings Diagnostic  Dominance: Right  Left Anterior Descending Prox LAD lesion is 30% stenosed. The lesion was previously treated . Mid LAD lesion is 30% stenosed.  First Diagonal Branch The diagonal branch, previously intervened upon, and is now occluded at its origin with no collateral flow present to visualize this vessel. Ost 1st Diag lesion is 30% stenosed.  Left Circumflex Prox Cx lesion is 50% stenosed.  Right Coronary Artery The vessel exhibits minimal luminal irregularities. Dominant vessel, minimal irregularity with no obstructive disease.  Supplies a PDA and PLA branch.  Right Posterior Descending Artery The vessel exhibits minimal luminal irregularities.  Intervention  No interventions have been documented.   CARDIAC CATHETERIZATION  CARDIAC CATHETERIZATION 12/05/2018  Conclusion  Previously placed Prox LAD drug eluting stent is widely patent.  Ost 1st Diag lesion is 95% stenosed.  Scoring balloon angioplasty was performed using a  BALLOON WOLVERINE 2.50X10.  Post intervention, there is a 20% residual stenosis.  The left ventricular systolic function is normal. The left ventricular ejection fraction is 55-65% by visual estimate.  Successful occlusion of distal diagonal branch with prolonged balloon inflations and protamine  infusion after initial wire related perforation of the distal diagonal branch.  1st Diag lesion is 100% stenosed.  SUMMARY  Culprit lesion:  90% ostial 1st Diag (jailed) -successful scoring balloon angioplasty using 2.5 mm Wolverine scoring balloon  --Distal diagonal wire perforation with pericardial effusion and initial stages of pericardial tamponade --> successful occlusion with prolonged balloon inflations and administration of protamine   --Unsuccessful pericardiocentesis  Widely patent LAD stent  Otherwise stable coronary arteries.  Normal LVEF and EDP pre-PCI  Clear ST elevations for roughly 15 minutes, most likely type for MI.   RECOMMENDATIONS  Admit to CCU on Levophed  --continue Levophed  overnight  Monitor closely for signs of tamponade.  Follow troponins for likely type IV MI  Stat echocardiogram ordered.  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  Dr. Lucas from CT surgery is aware the patient & current condition  Findings Coronary Findings Diagnostic  Dominance: Right  Left Anterior Descending Previously placed Prox LAD drug eluting stent is widely patent.  First Diagonal Branch Ost 1st Diag lesion is 95% stenosed. The lesion is located at the bifurcation and eccentric. Jailed 1st Diag lesion is 100% stenosed. Distal diagonal branch occlusion after prolonged  balloon angioplasty.  This was in response to wire perforation.  Intervention  Ost 1st Diag lesion Angioplasty Lesion length:  10 mm. CATH VISTA GUIDE 6FR XBLAD3.5 guide  catheter was inserted. WIRE SION BLACK 190 guidewire used to cross lesion. Scoring balloon angioplasty was performed using a BALLOON WOLVERINE 2.50X10. Maximum pressure: 12 atm. Inflation time: 60 sec. WIRE SION Blue used for LAD protection Post-Intervention Lesion Assessment The intervention was successful. Pre-interventional TIMI flow is 3. Post-intervention TIMI flow is 3. Treated lesion length:  10 mm. At this lesion, a perforation of the vessel occurred. -Unfortunately, the side on black wire advanced too far distally with scoring balloon removal and this led to distal diagonal wire perforation. With post PTCA angiography revealing distal perforation, I used the 2.0 mm balloon that initially was used for predilation for prolonged inflations in the distal diagonal branch.  Initial attempts seem to be successful, however there was still blush.  The patient then became hypotensive with concerns for possible tamponade physiology.  He was started on IV Levophed  and given a 1000 mL bolus. The balloon was then reinserted with now prolonged inflations of 15, 15 and 20 minutes held.  After first prolonged inflation, there was still some blush, therefore protamine  was administered.  After the second do, no further blush was noted.  Of note, the patient did become quite symptomatic with the dropping of his blood pressure and then began to have significant chest pain with a prolonged balloon inflation on the second occasion. There is a 20% residual stenosis post intervention.   STRESS TESTS  MYOCARDIAL PERFUSION IMAGING 12/01/2018  Interpretation Summary  Nuclear stress EF: 54%.  The left ventricular ejection fraction is mildly decreased (45-54%).  Horizontal ST segment depression ST segment depression of 2 mm was noted during stress in the II, III, V5 and V6 leads, beginning at 8 minutes of stressST deviation beginning in recovery.  Defect 1: There is a large defect of severe severity present in the basal  inferior, basal inferolateral, mid inferior, mid inferolateral, apical inferior, apical lateral and apex location.  Defect 2: There is a large defect of severe severity present in the basal anterior, basal anterolateral, mid anterior, mid anterolateral, apical anterior and apex location.  Defect 3: There is a medium defect of moderate severity present in the basal inferoseptal and mid inferoseptal location.  Findings consistent with ischemia and prior myocardial infarction with peri-infarct ischemia.  This is a high risk study.  There is a medium size, severe perfusion defect in the inferior and inferolateral walls with stress that is partially reversible at rest, returning to moderate. Base to apex. There is a absent, large perfusion defect in the anterior wall from base to apex. It is partially reversible at rest and returns to moderate at the base and mid ventricle and returns to mild at the apex. Anterolateral defect is severe, and with full reversibility. Medium size, moderate perfusion defect in the inferoseptum with partial reversibility, returning to mild at rest.  ECG positive for ischemia in recovery.  Chest pain and shortness of breath with peak stress.  High risk study. Consider coronary angiography if clinically indicated.   ECHOCARDIOGRAM  ECHOCARDIOGRAM COMPLETE 01/03/2024  Narrative ECHOCARDIOGRAM REPORT    Patient Name:   Devon Mathews. Date of Exam: 01/03/2024 Medical Rec #:  [id number]      Height:       69.0 in Accession #:    [id number]     Weight:  284.8 lb Date of Birth:  [birth year]       BSA:          2.401 m Patient Age:    72 years       BP:           147/70 mmHg Patient Gender: M              HR:           73 bpm. Exam Location:  [address]  Procedure: 2D Echo, 3D Echo, Cardiac Doppler, Color Doppler and Strain Analysis (Both Spectral and Color Flow Doppler were utilized during procedure).  Indications:    Chest pain R07.9  History:         Patient has prior history of Echocardiogram examinations, most recent 03/27/2021. Diastolic heart failure, Previous Myocardial Infarction and CAD, TIA, Signs/Symptoms:Chest Pain; Risk Factors:morbid obesity, Family History of Coronary Artery Disease, Dyslipidemia, Sleep Apnea and Diabetes. Previous echo revealed LVEF 65% PAP 24.5 mmHg.  Sonographer:    Nolon Berg BA, RDCS Referring Phys: [phone] HAO MENG  IMPRESSIONS   1. Left ventricular ejection fraction, by estimation, is 60 to 65%. Left ventricular ejection fraction by 3D volume is 67 %. The left ventricle has normal function. The left ventricle has no regional wall motion abnormalities. Left ventricular diastolic parameters were normal. The average left ventricular global longitudinal strain is -19.5 %. The global longitudinal strain is normal. 2. Right ventricular systolic function is normal. The right ventricular size is mildly enlarged. 3. Left atrial size was mildly dilated. 4. Right atrial size was moderately dilated. 5. The mitral valve is normal in structure. Trivial mitral valve regurgitation. 6. The aortic valve is tricuspid. Aortic valve regurgitation is mild. No aortic stenosis is present. 7. The inferior vena cava is normal in size with greater than 50% respiratory variability, suggesting right atrial pressure of 3 mmHg.  Comparison(s): No significant change from prior study. Prior images reviewed side by side.  FINDINGS Left Ventricle: Left ventricular ejection fraction, by estimation, is 60 to 65%. Left ventricular ejection fraction by 3D volume is 67 %. The left ventricle has normal function. The left ventricle has no regional wall motion abnormalities. The average left ventricular global longitudinal strain is -19.5 %. Strain was performed and the global longitudinal strain is normal. The left ventricular internal cavity size was normal in size. There is no left ventricular hypertrophy. Left ventricular  diastolic parameters were normal.  Right Ventricle: The right ventricular size is mildly enlarged. No increase in right ventricular wall thickness. Right ventricular systolic function is normal. The tricuspid regurgitant velocity is 2.96 m/s, and with an assumed right atrial pressure of 3 mmHg, the estimated right ventricular systolic pressure is 38.0 mmHg.  Left Atrium: Left atrial size was mildly dilated.  Right Atrium: Right atrial size was moderately dilated.  Pericardium: There is no evidence of pericardial effusion.  Mitral Valve: The mitral valve is normal in structure. Trivial mitral valve regurgitation.  Tricuspid Valve: The tricuspid valve is normal in structure. Tricuspid valve regurgitation is mild.  Aortic Valve: The aortic valve is tricuspid. Aortic valve regurgitation is mild. No aortic stenosis is present.  Pulmonic Valve: The pulmonic valve was grossly normal. Pulmonic valve regurgitation is mild. No evidence of pulmonic stenosis.  Aorta: The aortic root and ascending aorta are structurally normal, with no evidence of dilitation.  Venous: The inferior vena cava is normal in size with greater than 50% respiratory variability, suggesting right atrial pressure of 3 mmHg.  IAS/Shunts: No atrial level shunt detected by color flow Doppler.  Additional Comments: 3D was performed not requiring image post processing on an independent workstation and was normal.   LEFT VENTRICLE PLAX 2D LVIDd:         5.47 cm         Diastology LVIDs:         3.18 cm         LV e' medial:    8.16 cm/s LV PW:         0.94 cm         LV E/e' medial:  10.9 LV IVS:        1.24 cm         LV e' lateral:   11.20 cm/s LVOT diam:     2.20 cm         LV E/e' lateral: 7.9 LV SV:         80 LV SV Index:   33              2D Longitudinal LVOT Area:     3.80 cm        Strain 2D Strain GLS   -20.1 % (A4C): 2D Strain GLS   -17.9 % (A3C): 2D Strain GLS   -20.4 % (A2C): 2D Strain GLS   -19.5  % Avg:  3D Volume EF LV 3D EF:    Left ventricul ar ejection fraction by 3D volume is 67 %.  3D Volume EF: 3D EF:        67 % LV EDV:       222 ml LV ESV:       74 ml LV SV:        148 ml  RIGHT VENTRICLE RV Basal diam:  4.45 cm RV Mid diam:    3.58 cm RV S prime:     16.90 cm/s TAPSE (M-mode): 2.2 cm RVSP:           38.0 mmHg  LEFT ATRIUM             Index        RIGHT ATRIUM           Index LA diam:        4.20 cm 1.75 cm/m   RA Pressure: 3.00 mmHg LA Vol (A2C):   66.7 ml 27.78 ml/m  RA Area:     23.80 cm LA Vol (A4C):   52.4 ml 21.83 ml/m  RA Volume:   78.30 ml  32.61 ml/m LA Biplane Vol: 59.6 ml 24.82 ml/m AORTIC VALVE LVOT Vmax:   113.00 cm/s LVOT Vmean:  76.200 cm/s LVOT VTI:    0.211 m  AORTA Ao Root diam: 3.20 cm Ao Asc diam:  3.60 cm  MV E velocity: 88.60 cm/s  TRICUSPID VALVE MV A velocity: 97.90 cm/s  TR Peak grad:   35.0 mmHg MV E/A ratio:  0.91        TR Vmax:        296.00 cm/s Estimated RAP:  3.00 mmHg RVSP:           38.0 mmHg  SHUNTS Systemic VTI:  0.21 m Systemic Diam: 2.20 cm  Jerel Croitoru MD Electronically signed by Jerel Balding MD Signature Date/Time: 01/03/2024/12:05:13 PM    Final          ______________________________________________________________________________________________       Current Reported Medications:.    Current Meds  Medication Sig   acetaminophen  (TYLENOL ) 500 MG  tablet Take 500-1,000 mg by mouth every 6 (six) hours as needed for moderate pain.   amitriptyline  (ELAVIL ) 25 MG tablet Take 0.5 tablets (12.5 mg total) by mouth at bedtime as needed for sleep.   amLODipine  (NORVASC ) 2.5 MG tablet Take 1 tablet (2.5 mg total) by mouth daily.   atorvastatin  (LIPITOR ) 80 MG tablet TAKE 1 TABLET BY MOUTH EVERY DAY   clopidogrel  (PLAVIX ) 75 MG tablet Take 1 tablet (75 mg total) by mouth daily.   ELIQUIS  5 MG TABS tablet Take 1 tablet (5 mg total) by mouth 2 (two) times daily.   escitalopram (LEXAPRO)  10 MG tablet Take 10 mg by mouth daily.   losartan  (COZAAR ) 50 MG tablet TAKE 1 TABLET BY MOUTH EVERY DAY   melatonin 5 MG TABS Take 5 mg by mouth at bedtime.   metoprolol  succinate (TOPROL -XL) 50 MG 24 hr tablet TAKE 1 TABLET BY MOUTH EVERY DAY WITH OR IMMEDIATELY FOLLOWING A MEAL (Patient taking differently: Take 50 mg by mouth daily.)   nitroGLYCERIN  (NITROSTAT ) 0.4 MG SL tablet Place 1 tablet (0.4 mg total) under the tongue every 5 (five) minutes as needed for chest pain.   pantoprazole  (PROTONIX ) 40 MG tablet TAKE ONE TABLET BY MOUTH ONE TIME DAILY. must make follow up appointment for further refills (Patient taking differently: Take 40 mg by mouth daily.)   potassium chloride  SA (KLOR-CON  M) 20 MEQ tablet Take 20 mEq by mouth daily as needed (when taking furosemide ).   torsemide  (DEMADEX ) 20 MG tablet Take 1 tablet (20 mg total) by mouth daily.   Current Facility-Administered Medications for the 05/14/24 encounter (Office Visit) with Luba Matzen D, NP  Medication   sodium chloride  flush (NS) 0.9 % injection 3 mL    Physical Exam:    VS:  BP (!) 128/50   Pulse 64   Ht 5' 9.5 (1.765 m)   Wt 296 lb (134.3 kg)   SpO2 94%   BMI 43.08 kg/m    Wt Readings from Last 3 Encounters:  05/14/24 296 lb (134.3 kg)  03/29/24 292 lb 6.4 oz (132.6 kg)  03/27/24 292 lb 2 oz (132.5 kg)    GEN: Well nourished, well developed in no acute distress NECK: No JVD; No carotid bruits CARDIAC: RRR, no murmurs, rubs, gallops RESPIRATORY:  Clear to auscultation without rales, wheezing or rhonchi  ABDOMEN: Soft, non-tender, non-distended EXTREMITIES:  No edema; No acute deformity     Asessement and Plan:.    CAD/DOE/Chronic diastolic HF/persistent cough: S/p DES to P-M LAD in 2019, s/p PTCA-D1 in 2020.  Echo in 12/2023 showed EF 60 to 65%, normal LV function, no RWMA, normal RV systolic function, moderately dilated right atrium, mild aortic valve regurgitation, no significant change in prior study.   Cardiac PET stress was ordered in setting of intermittent chest discomfort dyspnea on exertion however this was not completed as he was hospitalized in March 2025 at [facility] health in setting of chest pressure.  Troponin was negative x 3 and EKG was nonischemic.  Chest x-ray was unremarkable.  Lexiscan  Myoview  was negative for ischemia. Patient previously noted improvement in symptoms with increased doses of torsemide . CPX results have not yet been finalized.  Patient continues to note some dyspnea on exertion however has noted improvement while on torsemide , continue torsemide  20 mg daily, discussed that he can take an additional dose for increased shortness of breath or lower extremity edema.  Check BNP and BMP today. Continue Plavix , metoprolol , losartan , amlodipine  and Lipitor .  Paroxysmal atrial fibrillation: Maintaining normal sinus rhythm, denies any significant palpitations.  Denies any significant bleeding problems.  Continue metoprolol  and Eliquis .  Hypertension: Blood pressure today 128/50. Continue current antihypertensive regimen.  Hyperlipidemia: LDL was 48 in 3/25.  Continue Lipitor .  OSA: Patient reports adherence to CPAP.  Patient notes that he has not had a titration or been seen by sleep medicine since 2020, will refer to Dr. Shlomo for ongoing monitoring.  History of TIA: No recurrence or residual.  Continue Plavix  and Lipitor .   Disposition: F/u with Dr. Raford in 3 months.   Signed, Edilson Vital D Darneshia Demary, NP

## 2024-05-14 ENCOUNTER — Ambulatory Visit: Attending: Cardiology | Admitting: Cardiology

## 2024-05-14 ENCOUNTER — Encounter: Payer: Self-pay | Admitting: Cardiology

## 2024-05-14 VITALS — BP 128/50 | HR 64 | Ht 69.5 in | Wt 296.0 lb

## 2024-05-14 DIAGNOSIS — I5032 Chronic diastolic (congestive) heart failure: Secondary | ICD-10-CM

## 2024-05-14 DIAGNOSIS — I480 Paroxysmal atrial fibrillation: Secondary | ICD-10-CM | POA: Diagnosis not present

## 2024-05-14 DIAGNOSIS — I10 Essential (primary) hypertension: Secondary | ICD-10-CM

## 2024-05-14 DIAGNOSIS — I2510 Atherosclerotic heart disease of native coronary artery without angina pectoris: Secondary | ICD-10-CM

## 2024-05-14 DIAGNOSIS — E785 Hyperlipidemia, unspecified: Secondary | ICD-10-CM

## 2024-05-14 DIAGNOSIS — R0609 Other forms of dyspnea: Secondary | ICD-10-CM | POA: Diagnosis not present

## 2024-05-14 DIAGNOSIS — R053 Chronic cough: Secondary | ICD-10-CM

## 2024-05-14 NOTE — Patient Instructions (Signed)
 Medication Instructions:  No changes *If you need a refill on your cardiac medications before your next appointment, please call your pharmacy*  Lab Work: Today we are going to draw a BNP, and Bmet If you have labs (blood work) drawn today and your tests are completely normal, you will receive your results only by: MyChart Message (if you have MyChart) OR A paper copy in the mail If you have any lab test that is abnormal or we need to change your treatment, we will call you to review the results.  Testing/Procedures: No testing  Follow-Up: At [facility], you and your health needs are our priority.  As part of our continuing mission to provide you with exceptional heart care, our providers are all part of one team.  This team includes your primary Cardiologist (physician) and Advanced Practice Providers or APPs (Physician Assistants and Nurse Practitioners) who all work together to provide you with the care you need, when you need it.  Your next appointment:   3 month(s)  Provider:   Annabella Scarce, MD    We recommend signing up for the patient portal called MyChart.  Sign up information is provided on this After Visit Summary.  MyChart is used to connect with patients for Virtual Visits (Telemedicine).  Patients are able to view lab/test results, encounter notes, upcoming appointments, etc.  Non-urgent messages can be sent to your provider as well.   To learn more about what you can do with MyChart, go to [email or web address].

## 2024-05-15 ENCOUNTER — Encounter: Payer: Self-pay | Admitting: Cardiology

## 2024-05-15 LAB — BRAIN NATRIURETIC PEPTIDE: BNP: 46.2 pg/mL (ref 0.0–100.0)

## 2024-05-15 LAB — BASIC METABOLIC PANEL WITH GFR
BUN/Creatinine Ratio: 12 (ref 10–24)
BUN: 14 mg/dL (ref 8–27)
CO2: 19 mmol/L — ABNORMAL LOW (ref 20–29)
Calcium: 8.8 mg/dL (ref 8.6–10.2)
Chloride: 108 mmol/L — ABNORMAL HIGH (ref 96–106)
Creatinine, Ser: 1.18 mg/dL (ref 0.76–1.27)
Glucose: 100 mg/dL — ABNORMAL HIGH (ref 70–99)
Potassium: 4.5 mmol/L (ref 3.5–5.2)
Sodium: 139 mmol/L (ref 134–144)
eGFR: 66 mL/min/1.73 (ref >59)

## 2024-05-16 ENCOUNTER — Ambulatory Visit: Payer: Self-pay | Admitting: Cardiology

## 2024-05-28 DIAGNOSIS — H1132 Conjunctival hemorrhage, left eye: Secondary | ICD-10-CM | POA: Diagnosis not present

## 2024-06-04 NOTE — Telephone Encounter (Signed)
 Patient is following up since he still hasn't heard back regarding results. Please advise.

## 2024-06-06 DIAGNOSIS — Z1152 Encounter for screening for COVID-19: Secondary | ICD-10-CM | POA: Diagnosis not present

## 2024-06-06 DIAGNOSIS — R051 Acute cough: Secondary | ICD-10-CM | POA: Diagnosis not present

## 2024-06-06 DIAGNOSIS — R0981 Nasal congestion: Secondary | ICD-10-CM | POA: Diagnosis not present

## 2024-06-06 DIAGNOSIS — G4733 Obstructive sleep apnea (adult) (pediatric): Secondary | ICD-10-CM | POA: Diagnosis not present

## 2024-06-06 DIAGNOSIS — R0989 Other specified symptoms and signs involving the circulatory and respiratory systems: Secondary | ICD-10-CM | POA: Diagnosis not present

## 2024-06-06 DIAGNOSIS — R0600 Dyspnea, unspecified: Secondary | ICD-10-CM | POA: Diagnosis not present

## 2024-06-06 DIAGNOSIS — J029 Acute pharyngitis, unspecified: Secondary | ICD-10-CM | POA: Diagnosis not present

## 2024-06-06 DIAGNOSIS — I480 Paroxysmal atrial fibrillation: Secondary | ICD-10-CM | POA: Diagnosis not present

## 2024-06-06 DIAGNOSIS — Z7901 Long term (current) use of anticoagulants: Secondary | ICD-10-CM | POA: Diagnosis not present

## 2024-06-06 DIAGNOSIS — I2510 Atherosclerotic heart disease of native coronary artery without angina pectoris: Secondary | ICD-10-CM | POA: Diagnosis not present

## 2024-06-06 DIAGNOSIS — I119 Hypertensive heart disease without heart failure: Secondary | ICD-10-CM | POA: Diagnosis not present

## 2024-06-12 ENCOUNTER — Telehealth (HOSPITAL_BASED_OUTPATIENT_CLINIC_OR_DEPARTMENT_OTHER): Payer: Self-pay | Admitting: Cardiovascular Disease

## 2024-06-12 NOTE — Telephone Encounter (Signed)
 Tried to call wife, no voicemail set up   Mychart message sent to patient and to Dr Bensimhon/Dr Raford to follow up on CPX done 7/24

## 2024-06-12 NOTE — Telephone Encounter (Signed)
 Wife Idell) called to follow-up on Cardiopulmonary exercise test results.

## 2024-06-13 NOTE — Telephone Encounter (Signed)
 Left message to call back.

## 2024-06-13 NOTE — Telephone Encounter (Signed)
-----   Message from Katlyn D [place] sent at 06/13/2024  1:07 PM EDT ----- Please also let Devon Mathews know that we have received the finalized results of his CPX.  Exercise testing with oxygen exchange showed a mild to moderate functional limitation that was felt to be  related to deconditioning and obesity.  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  This can be exacerbated by medications such as beta-blockers, he is currently on metoprolol  succinate 50 mg daily (a beta  blocker), recommend decreasing to 25 mg daily. Otherwise continue with recommendations previously provided as noted below:  I have discussed with Dr. Raford ongoing concerns regarding shortness of breath and fatigue, she has recommended he start on Jardiance  10 mg daily, please be aware this medication can increase risk  of UTI and yeast infections. He should notify us  if he has concerns related to the symptoms and should have a Bmet 2 weeks following start of medication for monitoring of kidney function and  electrolytes. Additionally Dr. Raford recommended a referral to the [facility] if he is agreeable to this.  ----- Message ----- From: Veneda Damien BRAVO Sent: 05/10/2024   3:39 PM EDT To: Katlyn D West, NP

## 2024-06-14 DIAGNOSIS — Z23 Encounter for immunization: Secondary | ICD-10-CM | POA: Diagnosis not present

## 2024-06-14 MED ORDER — METOPROLOL SUCCINATE ER 25 MG PO TB24: 25.0000 mg | ORAL_TABLET | Freq: Every day | ORAL | 3 refills | Status: AC

## 2024-06-14 MED ORDER — EMPAGLIFLOZIN 10 MG PO TABS: 10.0000 mg | ORAL_TABLET | Freq: Every day | ORAL | 3 refills | Status: DC

## 2024-06-14 NOTE — Telephone Encounter (Signed)
 Called patient advised of below they verbalized understanding Sent lads via main and sent meds to pharmacy

## 2024-06-14 NOTE — Telephone Encounter (Signed)
 Pt called in for results  He asked that you call this number [phone]

## 2024-06-14 NOTE — Addendum Note (Signed)
 Addended by: BYRON LEONTINE RAMAN on: 06/14/2024 04:55 PM   Modules accepted: Orders

## 2024-06-14 NOTE — Telephone Encounter (Signed)
-----   Message from Katlyn D [place] sent at 06/13/2024  1:07 PM EDT ----- Please also let Mr. Mousseau know that we have received the finalized results of his CPX.  Exercise testing with oxygen exchange showed a mild to moderate functional limitation that was felt to be  related to deconditioning and obesity.  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  This can be exacerbated by medications such as beta-blockers, he is currently on metoprolol  succinate 50 mg daily (a beta  blocker), recommend decreasing to 25 mg daily. Otherwise continue with recommendations previously provided as noted below:  I have discussed with Dr. Raford ongoing concerns regarding shortness of breath and fatigue, she has recommended he start on Jardiance  10 mg daily, please be aware this medication can increase risk  of UTI and yeast infections. He should notify us  if he has concerns related to the symptoms and should have a Bmet 2 weeks following start of medication for monitoring of kidney function and  electrolytes. Additionally Dr. Raford recommended a referral to the [facility] if he is agreeable to this.  ----- Message ----- From: Veneda Damien BRAVO Sent: 05/10/2024   3:39 PM EDT To: Katlyn D West, NP

## 2024-06-14 NOTE — Telephone Encounter (Signed)
 Left message for pt to call.

## 2024-06-15 NOTE — Telephone Encounter (Signed)
 Resulted 06/13/24 by Katlyn West, NP.   Brelyn Woehl S Bernese Doffing, NP

## 2024-06-19 DIAGNOSIS — G4733 Obstructive sleep apnea (adult) (pediatric): Secondary | ICD-10-CM | POA: Diagnosis not present

## 2024-06-20 ENCOUNTER — Emergency Department (HOSPITAL_COMMUNITY)

## 2024-06-20 ENCOUNTER — Other Ambulatory Visit: Payer: Self-pay

## 2024-06-20 ENCOUNTER — Emergency Department (HOSPITAL_COMMUNITY)
Admission: EM | Admit: 2024-06-20 | Discharge: 2024-06-20 | Disposition: A | Discharge status: Home / Self Care | Attending: Emergency Medicine | Admitting: Emergency Medicine

## 2024-06-20 DIAGNOSIS — R1011 Right upper quadrant pain: Secondary | ICD-10-CM | POA: Insufficient documentation

## 2024-06-20 DIAGNOSIS — R079 Chest pain, unspecified: Secondary | ICD-10-CM | POA: Diagnosis not present

## 2024-06-20 DIAGNOSIS — R0789 Other chest pain: Secondary | ICD-10-CM | POA: Diagnosis not present

## 2024-06-20 DIAGNOSIS — I2510 Atherosclerotic heart disease of native coronary artery without angina pectoris: Secondary | ICD-10-CM | POA: Diagnosis not present

## 2024-06-20 DIAGNOSIS — K7689 Other specified diseases of liver: Secondary | ICD-10-CM | POA: Diagnosis not present

## 2024-06-20 DIAGNOSIS — R1012 Left upper quadrant pain: Secondary | ICD-10-CM | POA: Insufficient documentation

## 2024-06-20 DIAGNOSIS — Z7901 Long term (current) use of anticoagulants: Secondary | ICD-10-CM | POA: Insufficient documentation

## 2024-06-20 DIAGNOSIS — R1013 Epigastric pain: Secondary | ICD-10-CM | POA: Insufficient documentation

## 2024-06-20 DIAGNOSIS — K760 Fatty (change of) liver, not elsewhere classified: Secondary | ICD-10-CM | POA: Diagnosis not present

## 2024-06-20 DIAGNOSIS — I517 Cardiomegaly: Secondary | ICD-10-CM | POA: Diagnosis not present

## 2024-06-20 DIAGNOSIS — T7840XA Allergy, unspecified, initial encounter: Secondary | ICD-10-CM | POA: Diagnosis not present

## 2024-06-20 DIAGNOSIS — K439 Ventral hernia without obstruction or gangrene: Secondary | ICD-10-CM | POA: Diagnosis not present

## 2024-06-20 DIAGNOSIS — I509 Heart failure, unspecified: Secondary | ICD-10-CM | POA: Insufficient documentation

## 2024-06-20 DIAGNOSIS — J449 Chronic obstructive pulmonary disease, unspecified: Secondary | ICD-10-CM | POA: Diagnosis not present

## 2024-06-20 DIAGNOSIS — K5710 Diverticulosis of small intestine without perforation or abscess without bleeding: Secondary | ICD-10-CM | POA: Diagnosis not present

## 2024-06-20 DIAGNOSIS — M40204 Unspecified kyphosis, thoracic region: Secondary | ICD-10-CM | POA: Diagnosis not present

## 2024-06-20 LAB — CBC
HCT: 40.6 % (ref 39.0–52.0)
Hemoglobin: 13.3 g/dL (ref 13.0–17.0)
MCH: 33.5 pg (ref 26.0–34.0)
MCHC: 32.8 g/dL (ref 30.0–36.0)
MCV: 102.3 fL — ABNORMAL HIGH (ref 80.0–100.0)
Platelets: 193 K/uL (ref 150–400)
RBC: 3.97 MIL/uL — ABNORMAL LOW (ref 4.22–5.81)
RDW: 15.6 % — ABNORMAL HIGH (ref 11.5–15.5)
WBC: 5.7 K/uL (ref 4.0–10.5)
nRBC: 0 % (ref 0.0–0.2)

## 2024-06-20 LAB — LIPASE, BLOOD: Lipase: 36 U/L (ref 11–51)

## 2024-06-20 LAB — I-STAT CHEM 8, ED
BUN: 23 mg/dL (ref 8–23)
Calcium, Ion: 1.1 mmol/L — ABNORMAL LOW (ref 1.15–1.40)
Chloride: 106 mmol/L (ref 98–111)
Creatinine, Ser: 1.4 mg/dL — ABNORMAL HIGH (ref 0.61–1.24)
Glucose, Bld: 116 mg/dL — ABNORMAL HIGH (ref 70–99)
HCT: 40 % (ref 39.0–52.0)
Hemoglobin: 13.6 g/dL (ref 13.0–17.0)
Potassium: 4.1 mmol/L (ref 3.5–5.1)
Sodium: 138 mmol/L (ref 135–145)
TCO2: 22 mmol/L (ref 22–32)

## 2024-06-20 LAB — BASIC METABOLIC PANEL WITH GFR
Anion gap: 12 (ref 5–15)
BUN: 21 mg/dL (ref 8–23)
CO2: 19 mmol/L — ABNORMAL LOW (ref 22–32)
Calcium: 9 mg/dL (ref 8.9–10.3)
Chloride: 105 mmol/L (ref 98–111)
Creatinine, Ser: 1.34 mg/dL — ABNORMAL HIGH (ref 0.61–1.24)
GFR, Estimated: 56 mL/min — ABNORMAL LOW (ref >60)
Glucose, Bld: 111 mg/dL — ABNORMAL HIGH (ref 70–99)
Potassium: 4.1 mmol/L (ref 3.5–5.1)
Sodium: 136 mmol/L (ref 135–145)

## 2024-06-20 LAB — TROPONIN I (HIGH SENSITIVITY)
Troponin I (High Sensitivity): 14 ng/L (ref <18)
Troponin I (High Sensitivity): 15 ng/L (ref <18)

## 2024-06-20 LAB — HEPATIC FUNCTION PANEL
ALT: 42 U/L (ref 0–44)
AST: 46 U/L — ABNORMAL HIGH (ref 15–41)
Albumin: 3.3 g/dL — ABNORMAL LOW (ref 3.5–5.0)
Alkaline Phosphatase: 80 U/L (ref 38–126)
Bilirubin, Direct: 0.2 mg/dL (ref 0.0–0.2)
Indirect Bilirubin: 0.7 mg/dL (ref 0.3–0.9)
Total Bilirubin: 0.9 mg/dL (ref 0.0–1.2)
Total Protein: 7.1 g/dL (ref 6.5–8.1)

## 2024-06-20 LAB — D-DIMER, QUANTITATIVE: D-Dimer, Quant: 0.47 ug{FEU}/mL (ref 0.00–0.50)

## 2024-06-20 MED ORDER — IOHEXOL 350 MG/ML SOLN
100.0000 mL | Freq: Once | INTRAVENOUS | Status: AC | PRN
  Administered 2024-06-20: 100 mL via INTRAVENOUS

## 2024-06-20 MED ORDER — FAMOTIDINE IN NACL 20-0.9 MG/50ML-% IV SOLN
20.0000 mg | Freq: Once | INTRAVENOUS | Status: AC
  Administered 2024-06-20: 20 mg via INTRAVENOUS
  Filled 2024-06-20: qty 50

## 2024-06-20 MED ORDER — ASPIRIN 81 MG PO CHEW
324.0000 mg | CHEWABLE_TABLET | Freq: Once | ORAL | Status: DC
  Filled 2024-06-20: qty 4

## 2024-06-20 MED ORDER — FUROSEMIDE 10 MG/ML IJ SOLN
20.0000 mg | Freq: Once | INTRAMUSCULAR | Status: AC
  Administered 2024-06-20: 20 mg via INTRAVENOUS
  Filled 2024-06-20: qty 2

## 2024-06-20 MED ORDER — DIPHENHYDRAMINE HCL 50 MG/ML IJ SOLN
12.5000 mg | Freq: Once | INTRAMUSCULAR | Status: AC
  Administered 2024-06-20: 12.5 mg via INTRAVENOUS
  Filled 2024-06-20: qty 1

## 2024-06-20 NOTE — ED Triage Notes (Signed)
 Pt arrive POV from home for c/o mid cp that he describe as heaviness and rash all over his body that pt thinks is an allergic reaction to Jardiance  medication that he had at yesterday at 10:30 am. Pt states he took 2 nitroglycerin  sl with no relief on his cp.

## 2024-06-20 NOTE — ED Provider Notes (Addendum)
 Patient did not want to wait any longer for his cardiac evaluation.  Lab work was unremarkable.  He has not had any chest pain since.  He can hold his Jardiance .  I would let the cardiology team know that he does not want to stay further consult.  They will help arrange close follow-up.  He is chest pain-free now.  I think it is reasonable that he follow-up closely with cardiology outpatient but I did think that he would benefit from evaluation from them today to see if they would want to pursue any further workup today or inpatient.  He understands the risks and benefits of leaving versus staying.  He will call his cardiologist as well but have also made sure that the cardiology team knows to follow-up with him closely outpatient as well.  He understands return precautions.  Patient discharged.  Cardiology did get back to me after patient was discharged and they have arranged for a follow-up appointment with him tomorrow at 10:55 AM.  This chart was dictated using voice recognition software.  Despite best efforts to proofread,  errors can occur which can change the documentation meaning.    Ruthe Cornet, DO 06/20/24 1235    Ruthe Cornet, DO 06/20/24 1236

## 2024-06-20 NOTE — Discharge Instructions (Addendum)
 Stop taking Jardiance  until cleared by your cardiologist.  You may use Benadryl  as needed for itching.  Continue Protonix .  Avoid things that irritate the stomach such as alcohol , caffeine, acid medications and spicy foods. Follow-up with a cardiologist for consideration of catheterization or stress test.  Return to ED if exertional chest pain, pain associate with shortness of breath, nausea, vomit, sweating or other concerns  Please follow-up with your cardiologist.  I put in referral for them to help arrange for follow-up and also let the team know.  If you develop worsening pain please return for evaluation as we discussed.  Cardiology does have an appointment for you tomorrow morning at 10:55 AM.  Please call their office.

## 2024-06-20 NOTE — ED Notes (Signed)
 Patient transported to Ultrasound

## 2024-06-20 NOTE — ED Notes (Signed)
 Took 3 SL nitroglycerin  and tums with no relief

## 2024-06-20 NOTE — ED Provider Notes (Signed)
 [place] [facility] AT [facility] Provider Note   CSN: [id number] Arrival date & time: 06/20/24  9943     Patient presents with: Chest Pain and Allergic Reaction   Devon Mak. is a 73 y.o. male.   Most recent cardiac catheterization in 2023 revealed patent LAD stent, continued patency of diagonal branch, patent LCx with mild to moderate nonobstructive stenosis in the mid vessel after the first OM branch, not flow-limiting, patent RCA without significant stenosis, normal LVEDP.  Ongoing medical therapy for nonobstructive CAD was advised.  Repeat echo in 12/2023 showed EF 60 to 65%, normal LV function, no RWMA, normal RV systolic function, moderately dilated right atrium, mild aortic valve regurgitation, no significant change from prior study. Did have stress test at [facility] in March 2025 that was reassuring. Comes in tonight with concern for allergic reaction to Jardiance .  States he started this 4 days ago.  2 days ago he developed itching along his waistband and buttocks.  This became worse tonight with itching to his arms that he became concerned he was having allergic reaction.  Denies any difficulty breathing, tongue swelling, lip swelling.  Did not take any antihistamines at home.  No other new exposures.  Also with central chest pain since 8 PM tonight feels similar to previous angina.  No radiation to his arms, neck or back.  No associated shortness of breath but does have epigastric pain and nausea.  No diaphoresis.  Took nitroglycerin  at home as well as Tums without relief.  Intermittent right-sided abdominal pain that comes and goes for several months at a time lasting for several minutes.  When it comes and goes in waves.  Still has gallbladder. He sometimes gets this pain in the left upper abdomen as well.  He notices it when he stretches a certain way or turns a certain way and pain may last up to 20 minutes at a time.  He has had this pain on and off  for quite some time and this is different than his cardiac type pain.  Sometimes is on the right sometimes is on the left.  The history is provided by the patient.  Chest Pain Associated symptoms: abdominal pain   Associated symptoms: no dizziness, no fever, no headache, no nausea, no shortness of breath, no vomiting and no weakness   Allergic Reaction Presenting symptoms: no rash        Prior to Admission medications   Medication Sig Start Date End Date Taking? Authorizing Provider  acetaminophen  (TYLENOL ) 500 MG tablet Take 500-1,000 mg by mouth every 6 (six) hours as needed for moderate pain.    [provider]  amitriptyline  (ELAVIL ) 25 MG tablet Take 0.5 tablets (12.5 mg total) by mouth at bedtime as needed for sleep. 05/26/21   [place] Adelita PENNER, MD  amLODipine  (NORVASC ) 2.5 MG tablet Take 1 tablet (2.5 mg total) by mouth daily. 01/07/22   Raford Riggs, MD  atorvastatin  (LIPITOR ) 80 MG tablet TAKE 1 TABLET BY MOUTH EVERY DAY 02/17/24   Raford Riggs, MD  clopidogrel  (PLAVIX ) 75 MG tablet Take 1 tablet (75 mg total) by mouth daily. 04/03/24   Raford Riggs, MD  ELIQUIS  5 MG TABS tablet Take 1 tablet (5 mg total) by mouth 2 (two) times daily. 02/14/23   [place] Adelita PENNER, MD  empagliflozin  (JARDIANCE ) 10 MG TABS tablet Take 1 tablet (10 mg total) by mouth daily before breakfast. 06/14/24   West, Katlyn D, NP  escitalopram (LEXAPRO)  10 MG tablet Take 10 mg by mouth daily.    [provider]  losartan  (COZAAR ) 50 MG tablet TAKE 1 TABLET BY MOUTH EVERY DAY 04/11/24   Raford Riggs, MD  melatonin 5 MG TABS Take 5 mg by mouth at bedtime. 12/01/23   [provider]  methocarbamol  (ROBAXIN ) 500 MG tablet Take 500 mg by mouth 3 (three) times daily as needed. Patient not taking: Reported on 05/14/2024 02/23/23   [provider]  metoprolol  succinate (TOPROL  XL) 25 MG 24 hr tablet Take 1 tablet (25 mg total) by mouth daily. 06/14/24   West, Katlyn D,  NP  nitroGLYCERIN  (NITROSTAT ) 0.4 MG SL tablet Place 1 tablet (0.4 mg total) under the tongue every 5 (five) minutes as needed for chest pain. 03/29/24   West, Katlyn D, NP  pantoprazole  (PROTONIX ) 40 MG tablet TAKE ONE TABLET BY MOUTH ONE TIME DAILY. must make follow up appointment for further refills Patient taking differently: Take 40 mg by mouth daily. 10/07/14   Anner Alm ORN, MD  potassium chloride  SA (KLOR-CON  M) 20 MEQ tablet Take 20 mEq by mouth daily as needed (when taking furosemide ).    [provider]  torsemide  (DEMADEX ) 20 MG tablet Take 1 tablet (20 mg total) by mouth daily. 04/03/24   West, Katlyn D, NP    Allergies: Penicillins    Review of Systems  Constitutional:  Positive for activity change and appetite change. Negative for fever.  HENT:  Negative for congestion.   Respiratory:  Positive for chest tightness. Negative for shortness of breath.   Cardiovascular:  Positive for chest pain.  Gastrointestinal:  Positive for abdominal pain. Negative for nausea and vomiting.  Genitourinary:  Negative for dysuria and hematuria.  Musculoskeletal:  Negative for arthralgias and myalgias.  Skin:  Negative for rash.  Neurological:  Negative for dizziness, weakness and headaches.   all other systems are negative except as noted in the HPI and PMH.    Updated Vital Signs BP (!) 143/69 (BP Location: Right Arm)   Pulse 80   Temp 98.2 F (36.8 C)   Resp 18   Ht 5' 9 (1.753 m)   Wt 134.4 kg   SpO2 97%   BMI 43.76 kg/m   Physical Exam Vitals and nursing note reviewed.  Constitutional:      General: He is not in acute distress.    Appearance: He is well-developed. He is not ill-appearing.     Comments: Patient did have acute onset of stabbing right upper abdominal pain when changing positions that lasted a few minutes.  HENT:     Head: Normocephalic and atraumatic.     Mouth/Throat:     Pharynx: No oropharyngeal exudate.     Comments: No tongue swelling or lip  swelling Eyes:     Conjunctiva/sclera: Conjunctivae normal.     Pupils: Pupils are equal, round, and reactive to light.  Neck:     Comments: No meningismus. Cardiovascular:     Rate and Rhythm: Normal rate and regular rhythm.     Heart sounds: Normal heart sounds. No murmur heard. Pulmonary:     Effort: Pulmonary effort is normal. No respiratory distress.     Breath sounds: Normal breath sounds.  Abdominal:     Palpations: Abdomen is soft.     Tenderness: There is abdominal tenderness. There is guarding. There is no rebound.     Comments: Epigastric and right upper quadrant tenderness with voluntary guarding.  No rebound.  Musculoskeletal:  General: No tenderness. Normal range of motion.     Cervical back: Normal range of motion and neck supple.  Skin:    General: Skin is warm.  Neurological:     Mental Status: He is alert and oriented to person, place, and time.     Cranial Nerves: No cranial nerve deficit.     Motor: No abnormal muscle tone.     Coordination: Coordination normal.     Comments:  5/5 strength throughout. CN 2-12 intact.Equal grip strength.   Psychiatric:        Behavior: Behavior normal.     (all labs ordered are listed, but only abnormal results are displayed) Labs Reviewed  BASIC METABOLIC PANEL WITH GFR - Abnormal; Notable for the following components:      Result Value   CO2 19 (*)    Glucose, Bld 111 (*)    Creatinine, Ser 1.34 (*)    GFR, Estimated 56 (*)    All other components within normal limits  CBC - Abnormal; Notable for the following components:   RBC 3.97 (*)    MCV 102.3 (*)    RDW 15.6 (*)    All other components within normal limits  HEPATIC FUNCTION PANEL - Abnormal; Notable for the following components:   Albumin 3.3 (*)    AST 46 (*)    All other components within normal limits  I-STAT CHEM 8, ED - Abnormal; Notable for the following components:   Creatinine, Ser 1.40 (*)    Glucose, Bld 116 (*)    Calcium , Ion 1.10 (*)     All other components within normal limits  LIPASE, BLOOD  D-DIMER, QUANTITATIVE  TROPONIN I (HIGH SENSITIVITY)  TROPONIN I (HIGH SENSITIVITY)    EKG: EKG Interpretation Date/Time:  Wednesday June 20 2024 01:10:59 EDT Ventricular Rate:  76 PR Interval:  194 QRS Duration:  88 QT Interval:  380 QTC Calculation: 427 R Axis:   32  Text Interpretation: Normal sinus rhythm Normal ECG When compared with ECG of 22-Mar-2024 07:46, PREVIOUS ECG IS PRESENT No significant change was found Confirmed by Carita Senior [id number]) on 06/20/2024 1:44:36 AM  Radiology: CT Angio Chest/Abd/Pel for Dissection W and/or Wo Contrast Result Date: 06/20/2024 CLINICAL DATA:  Acute aortic syndrome. Heaviness in the chest with mid chest pain. EXAM: CT ANGIOGRAPHY CHEST, ABDOMEN AND PELVIS TECHNIQUE: Non-contrast CT of the chest was initially obtained. Multidetector CT imaging through the chest, abdomen and pelvis was performed using the standard protocol during bolus administration of intravenous contrast. Multiplanar reconstructed images and MIPs were obtained and reviewed to evaluate the vascular anatomy. RADIATION DOSE REDUCTION: This exam was performed according to the departmental dose-optimization program which includes automated exposure control, adjustment of the mA and/or kV according to patient size and/or use of iterative reconstruction technique. CONTRAST:  OMNIPAQUE  IOHEXOL  350 MG/ML SOLN COMPARISON:  Chest CTA 08/05/2022. Abdomen and pelvis CT 10/10/2015. FINDINGS: CTA CHEST FINDINGS Cardiovascular: Pre contrast imaging of the chest shows no hyperdense crescent in the wall of the thoracic aorta to suggest the presence of an acute intramural hematoma. Heart size upper normal. No substantial pericardial effusion. Coronary artery calcification is evident. Mild atherosclerotic calcification is noted in the wall of the thoracic aorta. No thoracic aortic aneurysm. No dissection of the thoracic aorta  discernible on this non gated study. There is no filling defect within the opacified pulmonary arteries to suggest the presence of an acute pulmonary embolus. Mediastinum/Nodes: No mediastinal lymphadenopathy. There is no hilar lymphadenopathy. Mild  circumferential wall thickening noted mid esophagus (48/6). There is no axillary lymphadenopathy. Lungs/Pleura: No evidence for focal airspace consolidation. No pulmonary edema or pleural effusion. No pneumothorax. 3 mm perifissural nodule in the left lung (81/7) is stable since 2023 consistent with benign etiology. No followup imaging is recommended. Musculoskeletal: No worrisome lytic or sclerotic osseous abnormality. Review of the MIP images confirms the above findings. CTA ABDOMEN AND PELVIS FINDINGS VASCULAR Aorta: Normal caliber aorta without aneurysm, dissection, vasculitis or significant stenosis. Minimal atherosclerotic calcification. Celiac: Patent without evidence of aneurysm, dissection, vasculitis or significant stenosis. SMA: Patent without evidence of aneurysm, dissection, vasculitis or significant stenosis. Renals: Both renal arteries are patent without evidence of aneurysm, dissection, vasculitis, fibromuscular dysplasia or significant stenosis. IMA: Patent without evidence of aneurysm, dissection, vasculitis or significant stenosis. Inflow: Patent without evidence of aneurysm, dissection, vasculitis or significant stenosis. Veins: No obvious venous abnormality within the limitations of this arterial phase study. Review of the MIP images confirms the above findings. NON-VASCULAR Hepatobiliary: 14 mm subcapsular low-density lesion identified lateral segment left liver (segment III). This has been present and not substantially changed since the chest CTA of 07/10/2020 measuring 11 mm in the same dimension on that study. No intrahepatic or extrahepatic biliary dilation. Liver contour is compatible with cirrhosis. There is no evidence for gallstones,  gallbladder wall thickening, or pericholecystic fluid. Pancreas: No focal mass lesion. No dilatation of the main duct. No intraparenchymal cyst. No peripancreatic edema. Spleen: No splenomegaly. No suspicious focal mass lesion. Adrenals/Urinary Tract: No adrenal nodule or mass. Kidneys unremarkable. No evidence for hydroureter. The urinary bladder appears normal for the degree of distention. Stomach/Bowel: Stomach is unremarkable. No gastric wall thickening. No evidence of outlet obstruction. Duodenum is normally positioned as is the ligament of Treitz. Duodenal diverticulum noted. No small bowel wall thickening. No small bowel dilatation. The terminal ileum is normal. The appendix is not well visualized, but there is no edema or inflammation in the region of the cecal tip to suggest appendicitis. No gross colonic mass. No colonic wall thickening. Lymphatic: There is no gastrohepatic or hepatoduodenal ligament lymphadenopathy. No retroperitoneal or mesenteric lymphadenopathy. No pelvic sidewall lymphadenopathy. Reproductive: The prostate gland and seminal vesicles are unremarkable. Other: No intraperitoneal free fluid. Musculoskeletal: Fat containing paraumbilical ventral hernia measures 6.0 x 4.7 x 6.2 cm. No edema or fluid in the hernia sac to suggest fat incarceration. No worrisome lytic or sclerotic osseous abnormality. Review of the MIP images confirms the above findings. IMPRESSION: 1. No evidence for acute intramural hematoma or dissection of the thoracoabdominal aorta. No thoracoabdominal aortic aneurysm. 2. No evidence for acute pulmonary embolus. 3. Mild circumferential wall thickening in the mid esophagus. Esophagitis would be a consideration. 4. Liver morphology consistent with cirrhosis. 5. 14 mm subcapsular low-density lesion lateral segment left liver (segment III). This has been present and not substantially changed since the chest CTA of 07/10/2020 measuring 11 mm in the same dimension on that  study. Given relatively little change over 5 years, imaging features are most suggestive of a benign etiology. 6. Fat containing paraumbilical ventral hernia measures 6.0 x 4.7 x 6.2 cm. No edema or fluid in the hernia sac to suggest fat incarceration. 7.  Aortic Atherosclerosis (ICD10-I70.0). Electronically Signed   By: Camellia Candle M.D.   On: 06/20/2024 05:55   US  Abdomen Limited RUQ (LIVER/GB) Result Date: 06/20/2024 CLINICAL DATA:  Right upper quadrant abdominal pain EXAM: ULTRASOUND ABDOMEN LIMITED RIGHT UPPER QUADRANT COMPARISON:  None Available. FINDINGS: Gallbladder: The gallbladder is  mildly contracted but is otherwise unremarkable. No gallstones or wall thickening visualized. No sonographic Murphy sign noted by sonographer. Common bile duct: Diameter: 3 mm in proximal diameter Liver: Hepatic parenchymal echogenicity is diffusely increased and there is subtle nodularity of the liver contour, best appreciated within the left hepatic lobe, suggesting changes of underlying cirrhosis. 14 mm simple cyst is seen within the left hepatic lobe. No focal intrahepatic masses are seen and there is no intrahepatic biliary ductal dilation. Portal vein is patent on color Doppler imaging with normal direction of blood flow towards the liver. Other: None. IMPRESSION: 1. No acute abnormality. 2. Hepatic steatosis with subtle nodularity of the liver contour suggesting changes of underlying cirrhosis. Electronically Signed   By: Dorethia Molt M.D.   On: 06/20/2024 04:05   DG Chest 2 View Result Date: 06/20/2024 CLINICAL DATA:  Chest pain. EXAM: CHEST - 2 VIEW COMPARISON:  Chest PA 08/05/2022. FINDINGS: There is mild cardiomegaly, slight central vascular fullness without overt edema. The mediastinum is normal in outline. The lungs are clear with mild flattening of the diaphragm consistent with COPD. There is chronic eventration of the anterior right diaphragm. The sulci are sharp There is extensive bridging enthesopathy  the thoracic spine and mild thoracic kyphosis. No new osseous finding. IMPRESSION: 1. Mild cardiomegaly and slight central vascular fullness without overt edema. 2. COPD with no other evidence of acute chest process. Electronically Signed   By: Francis Quam M.D.   On: 06/20/2024 02:16     Procedures   Medications Ordered in the ED - No data to display                                  Medical Decision Making Amount and/or Complexity of Data Reviewed Labs: ordered. Decision-making details documented in ED Course. Radiology: ordered and independent interpretation performed. Decision-making details documented in ED Course. ECG/medicine tests: ordered and independent interpretation performed. Decision-making details documented in ED Course.  Risk OTC drugs. Prescription drug management.   Patient with concern for rash and possible allergic reaction to Jardiance .  No evidence of anaphylaxis.  No tongue swelling or lip swelling.  Does have scattered erythematous lesions across waistline.  Ongoing central chest pain and pressure similar to angina.  EKG without acute ST changes.  Recent stress test was reassuring.  Right upper quadrant pain is concerning for possible gallbladder pathology  Labs without significant leukocytosis.  First troponin is 14. LFTs and lipase are normal. Chest x-ray shows congestion without edema.  He has no hypoxia or increased work of breathing.  Given his intermittent right upper quadrant pain, ultrasound was obtained which shows no gallstones but does show hepatic steatosis.  LFTs and lipase are normal.  No leukocytosis.  Troponins remain negative.  Still having some chest pressure which improved with nitroglycerin .  Had recent stress test in March. Discussed with Dr. Gail of cardiology who will evaluate most likely during dayshift.  D-dimer negative low concern for PE. CTA shows normal aorta. No PE. Possible esophagitis.   Given his rash advised patient  to hold Jardiance  until he discusses it with cardiology.  No evidence of anaphylaxis.  May use antihistamines as needed.  Patient remains chest pain-free.  His description of his right upper abdominal pain left upper abdominal pain that comes and goes seems musculoskeletal in origin.  No evidence of gallbladder pathology.  No evidence of pulmonary embolism.  Given concern for unstable  angina we will await cardiology evaluation later this morning.     Final diagnoses:  None    ED Discharge Orders     None          Treshawn Allen, Garnette, MD 06/20/24 [phone]

## 2024-06-20 NOTE — ED Notes (Signed)
 Patient resting.

## 2024-06-21 ENCOUNTER — Ambulatory Visit: Attending: Physician Assistant | Admitting: Physician Assistant

## 2024-06-21 DIAGNOSIS — M109 Gout, unspecified: Secondary | ICD-10-CM | POA: Diagnosis not present

## 2024-06-21 DIAGNOSIS — E7800 Pure hypercholesterolemia, unspecified: Secondary | ICD-10-CM | POA: Diagnosis not present

## 2024-06-21 DIAGNOSIS — Z125 Encounter for screening for malignant neoplasm of prostate: Secondary | ICD-10-CM | POA: Diagnosis not present

## 2024-06-21 DIAGNOSIS — I110 Hypertensive heart disease with heart failure: Secondary | ICD-10-CM | POA: Diagnosis not present

## 2024-06-21 DIAGNOSIS — I509 Heart failure, unspecified: Secondary | ICD-10-CM | POA: Diagnosis not present

## 2024-06-21 NOTE — Progress Notes (Deleted)
 Cardiology Office Note   Date:  06/21/2024  ID:  Devon [name], DOB May 18, 1951, MRN [id number] PCP: Vernadine Charlie ORN, MD  [facility] Providers Cardiologist:  Annabella Scarce, MD   History of Present Illness Devon Mathews. is a 73 y.o. male with a past medical history of CAD status post DES to P/M LAD in 2019, status post PTCA to D1 in 2020, PAF, chronic diastolic heart failure, HTN, HLD, TIA, OSA, obesity and GERD here for follow-up appointment.  History includes cardiac catheterization back in 2006 revealing mild nonobstructive CAD.  Subsequent Myoview 's were low risk.  Patient was hospitalized in November 2019 in the setting of NSTEMI.  Cardiac catheterization revealed 95% pLAD stenosis status post DES.  Repeat cardiac cath 2020 revealed 90% ostial first diagonal stenosis (jailed and setting of prior stent), status post successful scoring balloon angioplasty, widely patent LAD stent.  Most recent cardiac cath in 2023 revealed patent LAD, continued patency of diagonal branch, patent LCx with mild to moderate nonobstructive stenosis in the mid vessel after the first OM branch, not flow-limiting, patent RCA without significant stenosis, normal LVEDP.  Ongoing medical therapy for nonobstructive CAD was advised.  Patient had an echocardiogram March 2025 showing LVEF 65%, normal WMA, normal RV systolic function, moderately dilated right atrium, mild aortic valve regurgitation, no significant change from prior study.  Patient was hospitalized at [facility] around this time in the setting of chest pressure.  Troponin was negative x 3, EKG was nonischemic.  CXR was unremarkable.  Lexiscan  Myoview  was negative for ischemia.  I was seen in follow-up shortly after and reported mild dyspnea on exertion and increased bilateral lower extremity edema, weight gain, mild orthopnea.  Was advised to take Demadex  20 mg daily for 5 days followed by 20 mg daily as needed.  Was seen in follow-up 6/5 by Damien Braver, NP.  Patient reported very mild improvement in shortness of breath with increasing the dose of his Demadex .  However, was not taking his medication regularly.  Also reported significant DOE with mild exertion and rare fleeting chest discomfort.  Noted to have nonpitting bilateral lower extremity edema and weight was up.  Persistent barking cough which had been ongoing since bronchitis in March 2025.  Was seen again 6/12.  Reported he was doing significantly better.  SOB had improved.  Tried Imdur  for 4 days following last visit however medication gave him worsening headaches and he was unable to tolerate.  Had some weight loss with increased dose of Demadex .  Recommended that patient undergo CPX per Dr. Scarce.  Was most recently seen 05/14/2024 and was overall doing well.  Denies chest pain but continues to note DOE.  Unfortunately his CPX results had not been finalized, unable to complete review at that time.  Denied any lower extremity edema.  Did have a cough which is chronic and possibly related to acid reflux.  Adherence with CPAP device.  Today, he***  ROS: ***  Studies Reviewed      CARDIAC CATHETERIZATION   CARDIAC CATHETERIZATION 08/17/2022   Conclusion   Ost 1st Diag lesion is 30% stenosed.   1.  Patent left main with no obstructive disease 2.  Patent LAD, including the stented segment in the proximal vessel, with continued patency of the diagonal branch that was intervened upon in 2020 3.  Patent circumflex with mild to moderate nonobstructive stenosis in the mid vessel after the first OM branch, does not appear flow-limiting 4.  Patent  RCA without significant stenosis 5.  Normal LVEDP   Recommendations: Ongoing medical therapy for nonobstructive coronary artery disease   Findings Coronary Findings Diagnostic  Dominance: Right   Left Anterior Descending Prox LAD lesion is 30% stenosed. The lesion was previously treated . Mid LAD lesion is 30% stenosed.   First  Diagonal Branch The diagonal branch, previously intervened upon, and is now occluded at its origin with no collateral flow present to visualize this vessel. Ost 1st Diag lesion is 30% stenosed.   Left Circumflex Prox Cx lesion is 50% stenosed.   Right Coronary Artery The vessel exhibits minimal luminal irregularities. Dominant vessel, minimal irregularity with no obstructive disease.  Supplies a PDA and PLA branch.   Right Posterior Descending Artery The vessel exhibits minimal luminal irregularities.   Intervention   No interventions have been documented.     CARDIAC CATHETERIZATION   CARDIAC CATHETERIZATION 12/05/2018   Conclusion  Previously placed Prox LAD drug eluting stent is widely patent.  Ost 1st Diag lesion is 95% stenosed.  Scoring balloon angioplasty was performed using a BALLOON WOLVERINE 2.50X10.  Post intervention, there is a 20% residual stenosis.  The left ventricular systolic function is normal. The left ventricular ejection fraction is 55-65% by visual estimate.  Successful occlusion of distal diagonal branch with prolonged balloon inflations and protamine  infusion after initial wire related perforation of the distal diagonal branch.  1st Diag lesion is 100% stenosed.   SUMMARY  Culprit lesion:  90% ostial 1st Diag (jailed) -successful scoring balloon angioplasty using 2.5 mm Wolverine scoring balloon  --Distal diagonal wire perforation with pericardial effusion and initial stages of pericardial tamponade --> successful occlusion with prolonged balloon inflations and administration of protamine   --Unsuccessful pericardiocentesis  Widely patent LAD stent  Otherwise stable coronary arteries.  Normal LVEF and EDP pre-PCI  Clear ST elevations for roughly 15 minutes, most likely type for MI.     RECOMMENDATIONS  Admit to CCU on Levophed  --continue Levophed  overnight  Monitor closely for signs of tamponade.  Follow troponins for likely type IV  MI  Stat echocardiogram ordered.  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   Dr. Lucas from CT surgery is aware the patient & current condition   Findings Coronary Findings Diagnostic  Dominance: Right   Left Anterior Descending Previously placed Prox LAD drug eluting stent is widely patent.   First Diagonal Branch Ost 1st Diag lesion is 95% stenosed. The lesion is located at the bifurcation and eccentric. Jailed 1st Diag lesion is 100% stenosed. Distal diagonal branch occlusion after prolonged balloon angioplasty.  This was in response to wire perforation.   Intervention   Ost 1st Diag lesion Angioplasty Lesion length:  10 mm. CATH VISTA GUIDE 6FR XBLAD3.5 guide catheter was inserted. WIRE SION BLACK 190 guidewire used to cross lesion. Scoring balloon angioplasty was performed using a BALLOON WOLVERINE 2.50X10. Maximum pressure: 12 atm. Inflation time: 60 sec. WIRE SION Blue used for LAD protection Post-Intervention Lesion Assessment The intervention was successful. Pre-interventional TIMI flow is 3. Post-intervention TIMI flow is 3. Treated lesion length:  10 mm. At this lesion, a perforation of the vessel occurred. -Unfortunately, the side on black wire advanced too far distally with scoring balloon removal and this led to distal diagonal wire perforation. With post PTCA angiography revealing distal perforation, I used the 2.0 mm balloon that initially was used for  predilation for prolonged inflations in the distal diagonal branch.  Initial attempts seem to be successful, however there was still blush.  The patient then became hypotensive with concerns for possible tamponade physiology.  He was started on IV Levophed  and given a 1000 mL bolus. The balloon was then reinserted with now prolonged inflations of 15, 15 and 20 minutes held.  After first  prolonged inflation, there was still some blush, therefore protamine  was administered.  After the second do, no further blush was noted.  Of note, the patient did become quite symptomatic with the dropping of his blood pressure and then began to have significant chest pain with a prolonged balloon inflation on the second occasion. There is a 20% residual stenosis post intervention.     STRESS TESTS   MYOCARDIAL PERFUSION IMAGING 12/01/2018   Interpretation Summary  Nuclear stress EF: 54%.  The left ventricular ejection fraction is mildly decreased (45-54%).  Horizontal ST segment depression ST segment depression of 2 mm was noted during stress in the II, III, V5 and V6 leads, beginning at 8 minutes of stressST deviation beginning in recovery.  Defect 1: There is a large defect of severe severity present in the basal inferior, basal inferolateral, mid inferior, mid inferolateral, apical inferior, apical lateral and apex location.  Defect 2: There is a large defect of severe severity present in the basal anterior, basal anterolateral, mid anterior, mid anterolateral, apical anterior and apex location.  Defect 3: There is a medium defect of moderate severity present in the basal inferoseptal and mid inferoseptal location.  Findings consistent with ischemia and prior myocardial infarction with peri-infarct ischemia.  This is a high risk study.   There is a medium size, severe perfusion defect in the inferior and inferolateral walls with stress that is partially reversible at rest, returning to moderate. Base to apex. There is a absent, large perfusion defect in the anterior wall from base to apex. It is partially reversible at rest and returns to moderate at the base and mid ventricle and returns to mild at the apex. Anterolateral defect is severe, and with full reversibility. Medium size, moderate perfusion defect in the inferoseptum with partial reversibility, returning to mild at rest.    ECG positive for ischemia in recovery.   Chest pain and shortness of breath with peak stress.   High risk study. Consider coronary angiography if clinically indicated.   ECHOCARDIOGRAM   ECHOCARDIOGRAM COMPLETE 01/03/2024   Narrative ECHOCARDIOGRAM REPORT       Patient Name:   Devon Mathews. Date of Exam: 01/03/2024 Medical Rec #:  [id number]      Height:       69.0 in Accession #:    [id number]     Weight:       284.8 lb Date of Birth:  March 25, 1951       BSA:          2.401 m Patient Age:    72 years       BP:           147/70 mmHg Patient Gender: M              HR:           73 bpm. Exam Location:  [address]   Procedure: 2D Echo, 3D Echo, Cardiac Doppler, Color Doppler and Strain Analysis (Both Spectral and Color Flow Doppler were utilized during procedure).   Indications:    Chest pain R07.9   History:  Patient has prior history of Echocardiogram examinations, most recent 03/27/2021. Diastolic heart failure, Previous Myocardial Infarction and CAD, TIA, Signs/Symptoms:Chest Pain; Risk Factors:morbid obesity, Family History of Coronary Artery Disease, Dyslipidemia, Sleep Apnea and Diabetes. Previous echo revealed LVEF 65% PAP 24.5 mmHg.   Sonographer:    Nolon Berg BA, RDCS Referring Phys: [id number] HAO MENG   IMPRESSIONS     1. Left ventricular ejection fraction, by estimation, is 60 to 65%. Left ventricular ejection fraction by 3D volume is 67 %. The left ventricle has normal function. The left ventricle has no regional wall motion abnormalities. Left ventricular diastolic parameters were normal. The average left ventricular global longitudinal strain is -19.5 %. The global longitudinal strain is normal. 2. Right ventricular systolic function is normal. The right ventricular size is mildly enlarged. 3. Left atrial size was mildly dilated. 4. Right atrial size was moderately dilated. 5. The mitral valve is normal in structure. Trivial mitral valve  regurgitation. 6. The aortic valve is tricuspid. Aortic valve regurgitation is mild. No aortic stenosis is present. 7. The inferior vena cava is normal in size with greater than 50% respiratory variability, suggesting right atrial pressure of 3 mmHg.   Comparison(s): No significant change from prior study. Prior images reviewed side by side.   FINDINGS Left Ventricle: Left ventricular ejection fraction, by estimation, is 60 to 65%. Left ventricular ejection fraction by 3D volume is 67 %. The left ventricle has normal function. The left ventricle has no regional wall motion abnormalities. The average left ventricular global longitudinal strain is -19.5 %. Strain was performed and the global longitudinal strain is normal. The left ventricular internal cavity size was normal in size. There is no left ventricular hypertrophy. Left ventricular diastolic parameters were normal.   Right Ventricle: The right ventricular size is mildly enlarged. No increase in right ventricular wall thickness. Right ventricular systolic function is normal. The tricuspid regurgitant velocity is 2.96 m/s, and with an assumed right atrial pressure of 3 mmHg, the estimated right ventricular systolic pressure is 38.0 mmHg.   Left Atrium: Left atrial size was mildly dilated.   Right Atrium: Right atrial size was moderately dilated.   Pericardium: There is no evidence of pericardial effusion.   Mitral Valve: The mitral valve is normal in structure. Trivial mitral valve regurgitation.   Tricuspid Valve: The tricuspid valve is normal in structure. Tricuspid valve regurgitation is mild.   Aortic Valve: The aortic valve is tricuspid. Aortic valve regurgitation is mild. No aortic stenosis is present.   Pulmonic Valve: The pulmonic valve was grossly normal. Pulmonic valve regurgitation is mild. No evidence of pulmonic stenosis.   Aorta: The aortic root and ascending aorta are structurally normal, with no evidence of  dilitation.   Venous: The inferior vena cava is normal in size with greater than 50% respiratory variability, suggesting right atrial pressure of 3 mmHg.   IAS/Shunts: No atrial level shunt detected by color flow Doppler.   Additional Comments: 3D was performed not requiring image post processing on an independent workstation and was normal.     LEFT VENTRICLE PLAX 2D LVIDd:         5.47 cm         Diastology LVIDs:         3.18 cm         LV e' medial:    8.16 cm/s LV PW:         0.94 cm         LV E/e' medial:  10.9 LV IVS:        1.24 cm         LV e' lateral:   11.20 cm/s LVOT diam:     2.20 cm         LV E/e' lateral: 7.9 LV SV:         80 LV SV Index:   33              2D Longitudinal LVOT Area:     3.80 cm        Strain 2D Strain GLS   -20.1 % (A4C): 2D Strain GLS   -17.9 % (A3C): 2D Strain GLS   -20.4 % (A2C): 2D Strain GLS   -19.5 % Avg:   3D Volume EF LV 3D EF:    Left ventricul ar ejection fraction by 3D volume is 67 %.   3D Volume EF: 3D EF:        67 % LV EDV:       222 ml LV ESV:       74 ml LV SV:        148 ml   RIGHT VENTRICLE RV Basal diam:  4.45 cm RV Mid diam:    3.58 cm RV S prime:     16.90 cm/s TAPSE (M-mode): 2.2 cm RVSP:           38.0 mmHg   LEFT ATRIUM             Index        RIGHT ATRIUM           Index LA diam:        4.20 cm 1.75 cm/m   RA Pressure: 3.00 mmHg LA Vol (A2C):   66.7 ml 27.78 ml/m  RA Area:     23.80 cm LA Vol (A4C):   52.4 ml 21.83 ml/m  RA Volume:   78.30 ml  32.61 ml/m LA Biplane Vol: 59.6 ml 24.82 ml/m AORTIC VALVE LVOT Vmax:   113.00 cm/s LVOT Vmean:  76.200 cm/s LVOT VTI:    0.211 m   AORTA Ao Root diam: 3.20 cm Ao Asc diam:  3.60 cm   MV E velocity: 88.60 cm/s  TRICUSPID VALVE MV A velocity: 97.90 cm/s  TR Peak grad:   35.0 mmHg MV E/A ratio:  0.91        TR Vmax:        296.00 cm/s Estimated RAP:  3.00 mmHg RVSP:           38.0 mmHg   SHUNTS Systemic VTI:  0.21 m Systemic Diam: 2.20  cm   Jerel Croitoru MD Electronically signed by Jerel Balding MD Signature Date/Time: 01/03/2024/12:05:13 PM       Final   Risk Assessment/Calculations {Does this patient have ATRIAL FIBRILLATION?:[phone]}         Physical Exam VS:  There were no vitals taken for this visit.       Wt Readings from Last 3 Encounters:  06/20/24 296 lb 4.8 oz (134.4 kg)  05/14/24 296 lb (134.3 kg)  03/29/24 292 lb 6.4 oz (132.6 kg)    GEN: Well nourished, well developed in no acute distress NECK: No JVD; No carotid bruits CARDIAC: ***RRR, no murmurs, rubs, gallops RESPIRATORY:  Clear to auscultation without rales, wheezing or rhonchi  ABDOMEN: Soft, non-tender, non-distended EXTREMITIES:  No edema; No deformity   ASSESSMENT AND PLAN CAD/DOE/chronic diastolic heart failure PAF HTN HLD OSA History of TIA    {  Are you ordering a CV Procedure (e.g. stress test, cath, DCCV, TEE, etc)?   Press F2        :[id number]}  Dispo: ***  Signed, Orren LOISE Fabry, PA-C

## 2024-06-28 ENCOUNTER — Telehealth (HOSPITAL_BASED_OUTPATIENT_CLINIC_OR_DEPARTMENT_OTHER): Payer: Self-pay | Admitting: *Deleted

## 2024-06-28 DIAGNOSIS — Z1212 Encounter for screening for malignant neoplasm of rectum: Secondary | ICD-10-CM | POA: Diagnosis not present

## 2024-06-28 DIAGNOSIS — M1990 Unspecified osteoarthritis, unspecified site: Secondary | ICD-10-CM | POA: Diagnosis not present

## 2024-06-28 DIAGNOSIS — Z1339 Encounter for screening examination for other mental health and behavioral disorders: Secondary | ICD-10-CM | POA: Diagnosis not present

## 2024-06-28 DIAGNOSIS — Z6841 Body Mass Index (BMI) 40.0 and over, adult: Secondary | ICD-10-CM | POA: Diagnosis not present

## 2024-06-28 DIAGNOSIS — F5104 Psychophysiologic insomnia: Secondary | ICD-10-CM | POA: Diagnosis not present

## 2024-06-28 DIAGNOSIS — R413 Other amnesia: Secondary | ICD-10-CM | POA: Diagnosis not present

## 2024-06-28 DIAGNOSIS — M109 Gout, unspecified: Secondary | ICD-10-CM | POA: Diagnosis not present

## 2024-06-28 DIAGNOSIS — D126 Benign neoplasm of colon, unspecified: Secondary | ICD-10-CM | POA: Diagnosis not present

## 2024-06-28 DIAGNOSIS — K317 Polyp of stomach and duodenum: Secondary | ICD-10-CM | POA: Diagnosis not present

## 2024-06-28 DIAGNOSIS — I480 Paroxysmal atrial fibrillation: Secondary | ICD-10-CM | POA: Diagnosis not present

## 2024-06-28 DIAGNOSIS — I10 Essential (primary) hypertension: Secondary | ICD-10-CM | POA: Diagnosis not present

## 2024-06-28 DIAGNOSIS — Z7901 Long term (current) use of anticoagulants: Secondary | ICD-10-CM | POA: Diagnosis not present

## 2024-06-28 DIAGNOSIS — Z0000 Encounter for general adult medical examination without abnormal findings: Secondary | ICD-10-CM | POA: Diagnosis not present

## 2024-06-28 DIAGNOSIS — Z1331 Encounter for screening for depression: Secondary | ICD-10-CM | POA: Diagnosis not present

## 2024-06-28 DIAGNOSIS — R7301 Impaired fasting glucose: Secondary | ICD-10-CM | POA: Diagnosis not present

## 2024-06-28 DIAGNOSIS — E7800 Pure hypercholesterolemia, unspecified: Secondary | ICD-10-CM | POA: Diagnosis not present

## 2024-06-28 DIAGNOSIS — I2510 Atherosclerotic heart disease of native coronary artery without angina pectoris: Secondary | ICD-10-CM | POA: Diagnosis not present

## 2024-06-28 DIAGNOSIS — R82998 Other abnormal findings in urine: Secondary | ICD-10-CM | POA: Diagnosis not present

## 2024-06-28 NOTE — Telephone Encounter (Signed)
 Katlyn D West, NP 06/13/2024  1:07 PM EDT Back to Top    Please also let Mr. Patmon know that we have received the finalized results of his CPX.  Exercise testing with oxygen exchange showed a mild to moderate functional limitation that was felt to be related to deconditioning and obesity.  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  This can be exacerbated by medications such as beta-blockers, he is currently on metoprolol  succinate 50 mg daily (a beta blocker), recommend decreasing to 25 mg daily. Otherwise continue with recommendations previously provided as noted below:   I have discussed with Dr. Raford ongoing concerns regarding shortness of breath and fatigue, she has recommended he start on Jardiance  10 mg daily, please be aware this medication can increase risk of UTI and yeast infections. He should notify us  if he has concerns related to the symptoms and should have a Bmet 2 weeks following start of medication for monitoring of kidney function and electrolytes. Additionally Dr. Raford recommended a referral to the [facility] if he is agreeable to this.    Spoke with patient, feeling better since decreasing Metoprolol   Did take the Jardiance  however he had rash and elevated heart rate taking, stopped and will not take again   Has follow up with Dr Raford in October, feels good waiting until then to be seen. Will call if any issues prior to then

## 2024-07-11 DIAGNOSIS — S20212A Contusion of left front wall of thorax, initial encounter: Secondary | ICD-10-CM | POA: Diagnosis not present

## 2024-07-11 DIAGNOSIS — S0990XA Unspecified injury of head, initial encounter: Secondary | ICD-10-CM | POA: Diagnosis not present

## 2024-07-11 DIAGNOSIS — S299XXA Unspecified injury of thorax, initial encounter: Secondary | ICD-10-CM | POA: Diagnosis not present

## 2024-07-11 DIAGNOSIS — S199XXA Unspecified injury of neck, initial encounter: Secondary | ICD-10-CM | POA: Diagnosis not present

## 2024-08-06 ENCOUNTER — Encounter (HOSPITAL_BASED_OUTPATIENT_CLINIC_OR_DEPARTMENT_OTHER): Payer: Self-pay

## 2024-08-07 ENCOUNTER — Encounter (HOSPITAL_BASED_OUTPATIENT_CLINIC_OR_DEPARTMENT_OTHER): Payer: Self-pay | Admitting: Cardiovascular Disease

## 2024-08-07 ENCOUNTER — Ambulatory Visit (HOSPITAL_BASED_OUTPATIENT_CLINIC_OR_DEPARTMENT_OTHER): Admitting: Cardiovascular Disease

## 2024-08-07 VITALS — BP 118/62 | HR 66 | Ht 70.0 in | Wt 293.4 lb

## 2024-08-07 DIAGNOSIS — I480 Paroxysmal atrial fibrillation: Secondary | ICD-10-CM | POA: Diagnosis not present

## 2024-08-07 DIAGNOSIS — Z9861 Coronary angioplasty status: Secondary | ICD-10-CM | POA: Diagnosis not present

## 2024-08-07 DIAGNOSIS — Z6841 Body Mass Index (BMI) 40.0 and over, adult: Secondary | ICD-10-CM | POA: Diagnosis not present

## 2024-08-07 DIAGNOSIS — G4733 Obstructive sleep apnea (adult) (pediatric): Secondary | ICD-10-CM | POA: Diagnosis not present

## 2024-08-07 DIAGNOSIS — I2510 Atherosclerotic heart disease of native coronary artery without angina pectoris: Secondary | ICD-10-CM

## 2024-08-07 DIAGNOSIS — Z5181 Encounter for therapeutic drug level monitoring: Secondary | ICD-10-CM

## 2024-08-07 DIAGNOSIS — I10 Essential (primary) hypertension: Secondary | ICD-10-CM

## 2024-08-07 LAB — LIPID PANEL
Chol/HDL Ratio: 3 ratio (ref 0.0–5.0)
Cholesterol, Total: 100 mg/dL (ref 100–199)
HDL: 33 mg/dL — ABNORMAL LOW (ref >39)
LDL Chol Calc (NIH): 51 mg/dL (ref 0–99)
Triglycerides: 74 mg/dL (ref 0–149)
VLDL Cholesterol Cal: 16 mg/dL (ref 5–40)

## 2024-08-07 LAB — COMPREHENSIVE METABOLIC PANEL WITH GFR
ALT: 23 IU/L (ref 0–44)
AST: 32 IU/L (ref 0–40)
Albumin: 3.5 g/dL — ABNORMAL LOW (ref 3.8–4.8)
Alkaline Phosphatase: 119 IU/L (ref 47–123)
BUN/Creatinine Ratio: 12 (ref 10–24)
BUN: 12 mg/dL (ref 8–27)
Bilirubin Total: 0.6 mg/dL (ref 0.0–1.2)
CO2: 19 mmol/L — ABNORMAL LOW (ref 20–29)
Calcium: 8.7 mg/dL (ref 8.6–10.2)
Chloride: 107 mmol/L — ABNORMAL HIGH (ref 96–106)
Creatinine, Ser: 1.03 mg/dL (ref 0.76–1.27)
Globulin, Total: 2.6 g/dL (ref 1.5–4.5)
Glucose: 114 mg/dL — ABNORMAL HIGH (ref 70–99)
Potassium: 4.4 mmol/L (ref 3.5–5.2)
Sodium: 139 mmol/L (ref 134–144)
Total Protein: 6.1 g/dL (ref 6.0–8.5)
eGFR: 77 mL/min/1.73 (ref >59)

## 2024-08-07 NOTE — Progress Notes (Signed)
 Cardiology Office Note:  .   Date:  08/07/2024  ID:  Devon Mathews., DOB [birth year], MRN [id number] PCP: Vernadine Charlie ORN, MD  [facility] Providers Cardiologist:  Annabella Scarce, MD    History of Present Illness: .    Devon Mathews. is a 73 y.o. male with CAD s/p NSTEMI and PCI, paroxysmal atrial fibrillation, procedural pericardial effusion/tamponade s/p pericardial window, mild AR, hypertension, hyperlipidemia, OSA and obestiy who presents for follow up.  Devon Mathews was previously seen by Dr. Maye and by Dr. Ladona.  He last saw Dr. Ladona 01/2015 and reported mild, chronic dyspnea. He underwent cardiac catheterization in 2003 and 2006 with a 40% LAD lesion noted.  He had an ETT in 2007 that was negative for ischemia and a nuclear stress in 2011 and 2012 that were negative.  He had an echo 07/02/13 that revealed LVEF 55-60% and mild mitral regurgitation. He was seen in clinic 07/2016 and reported several months of chest pain and exertional shortness of breath.  He was referred for exercise Myoview  08/13/16 that revealed LVEF 63% and no ischemia.  He also had an echo 08/16/16 with LVEF 60-65%, mild LVH, mild AR and mild TR.  Devon Mathews had a sleep study in 2014 that demonstrated moderate OSA. He continues to be compliant with CPAP.     Devon Mathews had an NSTEMI 08/2018.  Troponin peaked at 0.15.  He underwent LHC and had a 95% mid LAD lesion that was successfully stented with a DES.  20% distal left main disease was also noted.  LVEF was 65% on left ventriculogram.  Simvastatin  was switched to atorvastatin  and metoprolol  was increased to 50 mg daily.  Plans were made to continue dual antiplatelet therapy for 1 to 3 months followed by ticagrelor  alone.  Since his last appointment Devon Mathews, [place] and reported angina.  He was referred for Lexiscan  Myoview  12/01/2018 where there was concern for reversible ischemia in the anterolateral region and infarct with peri-infarct ischemia in the  inferolateral and inferoseptal regions.  He subsequently underwent left heart catheterization 12/05/2018 that revealed a 90% ostial D1 lesion.  He underwent successful balloon angioplasty.  However the procedure was complicated by a diagonal wire perforation with subsequent development of a pericardial effusion and tamponade.  He developed hemorrhagic pericarditis and was started on colchicine .  He was discharged but developed A worsening effusion and early tamponade was noted on his echo 12/2018.  He was admitted and underwent pericardial window.  During that time he also developed atrial fibrillation and had a TIA. His torsemide  was increased because of volume overload. He followed up with catherine lawrence, DNP 02/2021 and his weight was still increasing. Torsemide  was again increased. Repeat Echo 03/2021 revealed LVEF 60-65% with grade 1 diastolic dysfunction. BNP had been normal.   Devon Mathews was taking torsemide  once every other day. He had returned to the lower dose after developing some kidney pain when taking extra torsemide . 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 He reported typical home blood pressures in the 110s, with rare 90s or 120s. He had one episode of severe fatigue and found his BP to be in the 50s systolic.  He was referred to the PREP program and doing well with weight loss on Ozempic .     At his visit 07/2022 he reported CP and was referred for [facility] He had no obstructive CAD and prior PCI was patent.  He was hospitalized 12/2023 at [facility] with chest pressure.  Cardiac enzymes were negative.  He had a negative Lexiscan  Myoview .  He was last seen in clinic 04/2024 at which time he was feeling well but did continue to note exertional dyspnea.  He had a cardiopulmonary exercise test 04/2024 that was limited due to inability to reach maximum exercise secondary to neuropathy.  Resting spirometry suggested mild restriction.  There was mild to moderate  functional impairment thought to be due to obesity and deconditioning.  Blood pressure response was blunted and there was severe chronotropic incompetence.  He feels better and has more energy since the adjustment of his metoprolol .  He experienced an adverse reaction to Jardiance , including hives and elevated heart rate, leading to discontinuation after three days. He also mentions a past episode of significantly elevated blood pressure, described as 'stroke territory', but does not recall the exact timing.    Discussed the use of AI scribe software for clinical note transcription with the patient, who gave verbal consent to proceed.  History of Present Illness Devon Mathews experiences frequent falls and balance issues, having fallen several times, including a fall in the shower four weeks ago where he hit his head and injured his ribs. He needs to fully turn his head to maintain balance and feels 'off balance' without lightheadedness or dizziness. He attributes some balance issues to neuropathy in his feet, affecting his ability to feel the ground.  He has a history of obesity and struggles with weight management, noting knee and right ankle pain that limits physical activity. He previously lost 45 pounds but has regained weight. He owns exercise equipment but has not used it, citing laziness and pain as barriers.  He is currently on atorvastatin , amlodipine , metoprolol , losartan , torsemide , and Eliquis . Doubling his torsemide  dose for three days improved his breathing significantly.  He recalls a past ultrasound of his carotids, possibly done in [facility], but is unsure of the details. He has not had recent cholesterol labs, though he believes his cholesterol was previously reported as good.  ROS:  As per HPI  Studies Reviewed: [facility]ABRA       LHC 07/2022:   Ost 1st Diag lesion is 30% stenosed.   1.  Patent left main with no obstructive disease 2.  Patent LAD, including the stented segment in the  proximal vessel, with continued patency of the diagonal branch that was intervened upon in 2020 3.  Patent circumflex with mild to moderate nonobstructive stenosis in the mid vessel after the first OM branch, does not appear flow-limiting 4.  Patent RCA without significant stenosis 5.  Normal LVEDP   Recommendations: Ongoing medical therapy for nonobstructive coronary artery disease  Echo 01/03/24: IMPRESSIONS   1. Left ventricular ejection fraction, by estimation, is 60 to 65%. Left  ventricular ejection fraction by 3D volume is 67 %. The left ventricle has  normal function. The left ventricle has no regional wall motion  abnormalities. Left ventricular diastolic   parameters were normal. The average left ventricular global longitudinal  strain is -19.5 %. The global longitudinal strain is normal.   2. Right ventricular systolic function is normal. The right ventricular  size is mildly enlarged.   3. Left atrial size was mildly dilated.   4. Right atrial size was moderately dilated.   5. The mitral valve is normal in structure. Trivial mitral valve  regurgitation.   6. The aortic valve is tricuspid. Aortic valve regurgitation is mild. No  aortic stenosis  is present.   7. The inferior vena cava is normal in size with greater than 50%  respiratory variability, suggesting right atrial pressure of 3 mmHg.  Risk Assessment/Calculations:    CHA2DS2-VASc Score = 5   This indicates a 7.2% annual risk of stroke. The patient's score is based upon: CHF History: 1 HTN History: 1 Diabetes History: 1 Stroke History: 0 Vascular Disease History: 1 Age Score: 1 Gender Score: 0            Physical Exam:   VS:  BP 118/62 (BP Location: Right Arm, Patient Position: Sitting, Cuff Size: Large)   Pulse 66   Ht 5' 10 (1.778 m)   Wt 293 lb 6.4 oz (133.1 kg)   SpO2 93%   BMI 42.10 kg/m  , BMI Body mass index is 42.1 kg/m. GENERAL:  Well appearing HEENT: Pupils equal round and reactive, fundi  not visualized, oral mucosa unremarkable NECK:  No jugular venous distention, waveform within normal limits, carotid upstroke brisk and symmetric, no bruits, no thyromegaly LUNGS:  Clear to auscultation bilaterally HEART:  RRR.  PMI not displaced or sustained,S1 and S2 within normal limits, no S3, no S4, no clicks, no rubs, no murmurs ABD:  Central adiposity.  Positive bowel sounds normal in frequency in pitch, no bruits, no rebound, no guarding, no midline pulsatile mass, no hepatomegaly, no splenomegaly EXT:  2 plus pulses throughout, no edema, no cyanosis no clubbing SKIN:  No rashes no nodules NEURO:  Cranial nerves II through XII grossly intact, motor grossly intact throughout PSYCH:  Cognitively intact, oriented to person place and time   ASSESSMENT AND PLAN: .    Assessment & Plan # Atherosclerotic heart disease of native coronary artery # Hyperlipidemia:  Coronary artery disease with previous interventions. No recent chest pain or significant cardiac events. Previous exercise test indicated obesity, deconditioning, and chronotropic incompetence. Metoprolol  dose reduced to improve chronotropic response during exercise.  Continue clopidogrel  and statin.  Continue reduced dose metoprolol .   # Morbid Obesity Obesity contributing to deconditioning, knee and ankle pain, and increased fall risk. Previous attempts at weight loss with Ozempic  were not successful due to low dosing and concerns about side effects. - Refer to [facility] for weight management support. - Discuss potential reinitiation of Ozempic  with pharmacist. - Encourage regular physical activity and dietary modifications.  # Paroxysmal atrial fibrillation Paroxysmal atrial fibrillation managed with Eliquis  for stroke prevention.  Continue metoprolol .  # Recurrent falls due to peripheral neuropathy, deconditioning, obesity, and chronic bilateral knee and right ankle pain Recurrent falls due to peripheral  neuropathy, deconditioning, obesity, and chronic bilateral knee and right ankle pain. No intracranial bleeding or fractures from recent falls. Balance issues exacerbated by neuropathy, deconditioning, and obesity. Chronic pain limits mobility. - Refer to [facility] for weight management and activity guidance. - Encourage regular physical activity to improve muscle strength and balance. - Check insurance coverage for weight management programs. - Consider reinitiating Ozempic  if insurance allows and discuss dosing with pharmacist. - Encourage daily weight monitoring to assess fluid retention.  # Peripheral neuropathy Peripheral neuropathy in feet contributing to balance issues and fall risk. Sensory deficits impair balance, especially when turning quickly.  # Deconditioning Deconditioning due to lack of regular physical activity. Contributes to muscle weakness, balance issues, and increased fall risk. - Encourage regular physical activity, including leg lifts and squats during TV commercials. - Refer to [facility] for structured exercise program.  # Chronic  bilateral knee and right ankle pain Chronic pain in knees and right ankle limits mobility and contributes to deconditioning and fall risk. Pain exacerbated by obesity and lack of physical activity. - Encourage weight loss to reduce joint stress.  # Essential hypertension Hypertension managed with amlodipine , metoprolol , and losartan . Previous significant episode of elevated blood pressure noted. Blood pressure control is crucial to prevent further cardiovascular complications. - Continue current antihypertensive regimen.       Dispo: f/;u 3-4 months  Signed, Annabella Scarce, MD

## 2024-08-07 NOTE — Patient Instructions (Addendum)
 Medication Instructions:  Your physician recommends that you continue on your current medications as directed. Please refer to the Current Medication list given to you today.  *If you need a refill on your cardiac medications before your next appointment, please call your pharmacy*  Lab Work: LP/CMET TODAY   If you have labs (blood work) drawn today and your tests are completely normal, you will receive your results only by: MyChart Message (if you have MyChart) OR A paper copy in the mail If you have any lab test that is abnormal or we need to change your treatment, we will call you to review the results.  Testing/Procedures: NONE   Follow-Up: At [facility], you and your health needs are our priority.  As part of our continuing mission to provide you with exceptional heart care, our providers are all part of one team.  This team includes your primary Cardiologist (physician) and Advanced Practice Providers or APPs (Physician Assistants and Nurse Practitioners) who all work together to provide you with the care you need, when you need it.  Your next appointment:   3 to 4 month(s)  Provider:   Annabella Scarce, MD, Rosaline Bane, NP, or Reche Finder, NP    You have been referred to   [facility] TO DISCUSS OZEMPIC    Amb Ref to Medical Weight Management (Caren Verdon) Where: [facility] Address: [address] [place] [address] Phone: [phone] IF YOU DO NOT HEAR FROM THEM IN 2 WEEKS YOU CAN CALL THEM DIRECTLY   We recommend signing up for the patient portal called MyChart.  Sign up information is provided on this After Visit Summary.  MyChart is used to connect with patients for Virtual Visits (Telemedicine).  Patients are able to view lab/test results, encounter notes, upcoming appointments, etc.  Non-urgent messages can be sent to your provider as well.   To learn more about what you can do with MyChart, go to  [email or web address].

## 2024-08-08 ENCOUNTER — Ambulatory Visit: Payer: Self-pay | Admitting: Cardiovascular Disease

## 2024-08-13 ENCOUNTER — Encounter (INDEPENDENT_AMBULATORY_CARE_PROVIDER_SITE_OTHER): Payer: Self-pay

## 2024-09-06 DIAGNOSIS — M170 Bilateral primary osteoarthritis of knee: Secondary | ICD-10-CM | POA: Diagnosis not present

## 2024-09-06 DIAGNOSIS — M25511 Pain in right shoulder: Secondary | ICD-10-CM | POA: Diagnosis not present

## 2024-09-11 NOTE — Progress Notes (Deleted)
 Office Visit    Patient Name: Devon Mathews. Date of Encounter: 09/11/2024  Primary Care Provider:  Vernadine Charlie ORN, MD Primary Cardiologist:  Annabella Scarce, MD  Chief Complaint    Weight management  Significant Past Medical History   DM2 3/25 A1c 6.3, down from 6.9, no current medications  CAD 08/2018 NSTEMI w/DES to mLAD  OSA AHI 24.6 (2014)  HTN Controlled on amlodipine , losartan , metoprolol   HLD 10/25 LDL 51 on atorvastatin  80  AF CHADS2-VASc = 5, on Eliquis     Allergies  Allergen Reactions   Empagliflozin  Rash, Nausea And Vomiting and Palpitations    Increased heart rate   Penicillins Hives and Nausea And Vomiting    Did it involve swelling of the face/tongue/throat, SOB, or low BP? No Did it involve sudden or severe rash/hives, skin peeling, or any reaction on the inside of your mouth or nose? No Did you need to seek medical attention at a hospital or doctor's office? No When did it last happen?      30 + years If all above answers are "NO", may proceed with cephalosporin use.     History of Present Illness    Devon Mathews. is a 73 y.o. male patient of Dr Scarce, in the office today to discuss options for weight management.    Current weight management medications:   Previously tried meds:   Current meds that may affect weight:   Baseline weight/BMI:   Insurance payor:   Diet:   Exercise:   Family History:   Confirmed patient not ***pregnant and no personal or family history of medullary thyroid  carcinoma (MTC) or Multiple Endocrine Neoplasia syndrome type 2 (MEN 2).   Social History:   Tobacco:  Alcohol :  Caffeine:   Adherence Assessment  Do you ever forget to take your medication? [] Yes [] No  Do you ever skip doses due to side effects? [] Yes [] No  Do you have trouble affording your medicines? [] Yes [] No  Are you ever unable to pick up your medication due to transportation difficulties? [] Yes [] No  Do you ever stop taking  your medications because you don't believe they are helping? [] Yes [] No  Do you check your weight daily? [] Yes [] No   Adherence strategy: ***  Barriers to obtaining medications: ***   Accessory Clinical Findings    Lab Results  Component Value Date   CREATININE 1.03 08/07/2024   BUN 12 08/07/2024   NA 139 08/07/2024   K 4.4 08/07/2024   CL 107 (H) 08/07/2024   CO2 19 (L) 08/07/2024   Lab Results  Component Value Date   ALT 23 08/07/2024   AST 32 08/07/2024   ALKPHOS 119 08/07/2024   BILITOT 0.6 08/07/2024   Lab Results  Component Value Date   HGBA1C 6.0 (H) 02/14/2023      Home Medications/Allergies    Current Outpatient Medications  Medication Sig Dispense Refill   acetaminophen  (TYLENOL ) 500 MG tablet Take 500-1,000 mg by mouth every 6 (six) hours as needed for moderate pain.     amitriptyline  (ELAVIL ) 25 MG tablet Take 0.5 tablets (12.5 mg total) by mouth at bedtime as needed for sleep. 30 tablet 0   amLODipine  (NORVASC ) 2.5 MG tablet Take 1 tablet (2.5 mg total) by mouth daily. 90 tablet 3   atorvastatin  (LIPITOR ) 80 MG tablet TAKE 1 TABLET BY MOUTH EVERY DAY 90 tablet 2   clonazePAM (KLONOPIN) 0.5 MG tablet Take 0.5 mg by mouth daily as needed  for anxiety.     clopidogrel  (PLAVIX ) 75 MG tablet Take 1 tablet (75 mg total) by mouth daily. 90 tablet 3   ELIQUIS  5 MG TABS tablet Take 1 tablet (5 mg total) by mouth 2 (two) times daily. 180 tablet 0   escitalopram (LEXAPRO) 10 MG tablet Take 10 mg by mouth daily.     losartan  (COZAAR ) 50 MG tablet TAKE 1 TABLET BY MOUTH EVERY DAY 90 tablet 3   melatonin 5 MG TABS Take 5 mg by mouth at bedtime.     metoprolol  succinate (TOPROL  XL) 25 MG 24 hr tablet Take 1 tablet (25 mg total) by mouth daily. 90 tablet 3   nitroGLYCERIN  (NITROSTAT ) 0.4 MG SL tablet Place 1 tablet (0.4 mg total) under the tongue every 5 (five) minutes as needed for chest pain. 25 tablet 3   pantoprazole  (PROTONIX ) 40 MG tablet TAKE ONE TABLET BY MOUTH  ONE TIME DAILY. must make follow up appointment for further refills 15 tablet 0   potassium chloride  SA (KLOR-CON  M) 20 MEQ tablet Take 20 mEq by mouth daily as needed (when taking furosemide ). (Patient taking differently: Take 10 mEq by mouth daily as needed (when taking furosemide ).)     torsemide  (DEMADEX ) 20 MG tablet Take 1 tablet (20 mg total) by mouth daily. (Patient taking differently: Take 20 mg by mouth daily. Taking it every other day) 90 tablet 3   traMADol  (ULTRAM ) 50 MG tablet Take 50 mg by mouth daily as needed (pain).     Current Facility-Administered Medications  Medication Dose Route Frequency Provider Last Rate Last Admin   sodium chloride  flush (NS) 0.9 % injection 3 mL  3 mL Intravenous Q12H Raford Riggs, MD         Allergies  Allergen Reactions   Empagliflozin  Rash, Nausea And Vomiting and Palpitations    Increased heart rate   Penicillins Hives and Nausea And Vomiting    Did it involve swelling of the face/tongue/throat, SOB, or low BP? No Did it involve sudden or severe rash/hives, skin peeling, or any reaction on the inside of your mouth or nose? No Did you need to seek medical attention at a hospital or doctor's office? No When did it last happen?      30 + years If all above answers are "NO", may proceed with cephalosporin use.     Assessment & Plan    No problem-specific Assessment & Plan notes found for this encounter.   Aprel Egelhoff PharmD CPP [facility]  [address] [place], [place] [address] [phone]

## 2024-09-12 ENCOUNTER — Ambulatory Visit
Attending: Pharmacist Clinician (PhC)/ Clinical Pharmacy Specialist | Admitting: Pharmacist Clinician (PhC)/ Clinical Pharmacy Specialist

## 2024-09-17 DIAGNOSIS — M170 Bilateral primary osteoarthritis of knee: Secondary | ICD-10-CM | POA: Diagnosis not present

## 2024-11-08 ENCOUNTER — Ambulatory Visit (HOSPITAL_BASED_OUTPATIENT_CLINIC_OR_DEPARTMENT_OTHER): Admitting: Cardiovascular Disease

## 2024-11-08 ENCOUNTER — Encounter (HOSPITAL_BASED_OUTPATIENT_CLINIC_OR_DEPARTMENT_OTHER): Payer: Self-pay | Admitting: Cardiovascular Disease

## 2024-11-08 VITALS — BP 126/58 | HR 79 | Ht 70.0 in | Wt 290.0 lb

## 2024-11-08 DIAGNOSIS — E119 Type 2 diabetes mellitus without complications: Secondary | ICD-10-CM

## 2024-11-08 DIAGNOSIS — Z9861 Coronary angioplasty status: Secondary | ICD-10-CM | POA: Diagnosis not present

## 2024-11-08 DIAGNOSIS — E669 Obesity, unspecified: Secondary | ICD-10-CM

## 2024-11-08 DIAGNOSIS — E785 Hyperlipidemia, unspecified: Secondary | ICD-10-CM | POA: Diagnosis not present

## 2024-11-08 DIAGNOSIS — I5032 Chronic diastolic (congestive) heart failure: Secondary | ICD-10-CM | POA: Diagnosis not present

## 2024-11-08 DIAGNOSIS — K760 Fatty (change of) liver, not elsewhere classified: Secondary | ICD-10-CM | POA: Diagnosis not present

## 2024-11-08 DIAGNOSIS — I10 Essential (primary) hypertension: Secondary | ICD-10-CM | POA: Diagnosis not present

## 2024-11-08 DIAGNOSIS — I2510 Atherosclerotic heart disease of native coronary artery without angina pectoris: Secondary | ICD-10-CM

## 2024-11-08 DIAGNOSIS — I480 Paroxysmal atrial fibrillation: Secondary | ICD-10-CM

## 2024-11-08 DIAGNOSIS — G4733 Obstructive sleep apnea (adult) (pediatric): Secondary | ICD-10-CM

## 2024-11-08 NOTE — Patient Instructions (Addendum)
 Medication Instructions:  Your physician recommends that you continue on your current medications as directed. Please refer to the Current Medication list given to you today.   *If you need a refill on your cardiac medications before your next appointment, please call your pharmacy*  Lab Work: NONE   Testing/Procedures: NONE  Follow-Up: At [facility], you and your health needs are our priority.  As part of our continuing mission to provide you with exceptional heart care, our providers are all part of one team.  This team includes your primary Cardiologist (physician) and Advanced Practice Providers or APPs (Physician Assistants and Nurse Practitioners) who all work together to provide you with the care you need, when you need it.  Your next appointment:   6 month(s)  Provider:   Annabella Scarce, MD, Rosaline Bane, NP, or Reche Finder, NP    We recommend signing up for the patient portal called MyChart.  Sign up information is provided on this After Visit Summary.  MyChart is used to connect with patients for Virtual Visits (Telemedicine).  Patients are able to view lab/test results, encounter notes, upcoming appointments, etc.  Non-urgent messages can be sent to your provider as well.   To learn more about what you can do with MyChart, go to [email or web address].   Other Instructions CALL [facility] - [phone]  RIDE YOUR BIKE WITH GOAL OF 150 MINUTES EACH WEEK            Consider the Inspire device for sleep apnea.  This is done by the ENT doctors.

## 2024-11-08 NOTE — Progress Notes (Signed)
 " Cardiology Office Note:  .   Date:  11/08/2024  ID:  Devon Mathews., DOB 1951/09/10, MRN [id number] PCP: Vernadine Charlie ORN, MD  [facility] Providers Cardiologist:  Annabella Scarce, MD    History of Present Illness: .    Devon Mathews. is a 74 y.o. male with CAD s/p NSTEMI and PCI, paroxysmal atrial fibrillation, procedural pericardial effusion/tamponade s/p pericardial window, mild AR, hypertension, hyperlipidemia, OSA and obestiy who presents for follow up.  Devon Mathews was previously seen by Dr. Maye and by Dr. Ladona.  He last saw Dr. Ladona 01/2015 and reported mild, chronic dyspnea. He underwent cardiac catheterization in 2003 and 2006 with a 40% LAD lesion noted.  He had an ETT in 2007 that was negative for ischemia and a nuclear stress in 2011 and 2012 that were negative.  He had an echo 07/02/13 that revealed LVEF 55-60% and mild mitral regurgitation. He was seen in clinic 07/2016 and reported several months of chest pain and exertional shortness of breath.  He was referred for exercise Myoview  08/13/16 that revealed LVEF 63% and no ischemia.  He also had an echo 08/16/16 with LVEF 60-65%, mild LVH, mild AR and mild TR.  Devon Mathews had a sleep study in 2014 that demonstrated moderate OSA. He continues to be compliant with CPAP.     Devon Mathews had an NSTEMI 08/2018.  Troponin peaked at 0.15.  He underwent LHC and had a 95% mid LAD lesion that was successfully stented with a DES.  20% distal left main disease was also noted.  LVEF was 65% on left ventriculogram.  Simvastatin  was switched to atorvastatin  and metoprolol  was increased to 50 mg daily.  Plans were made to continue dual antiplatelet therapy for 1 to 3 months followed by ticagrelor  alone.  Since his last appointment Devon Mathews, [place] and reported angina.  He was referred for Lexiscan  Myoview  12/01/2018 where there was concern for reversible ischemia in the anterolateral region and infarct with peri-infarct ischemia in the  inferolateral and inferoseptal regions.  He subsequently underwent left heart catheterization 12/05/2018 that revealed a 90% ostial D1 lesion.  He underwent successful balloon angioplasty.  However the procedure was complicated by a diagonal wire perforation with subsequent development of a pericardial effusion and tamponade.  He developed hemorrhagic pericarditis and was started on colchicine .  He was discharged but developed A worsening effusion and early tamponade was noted on his echo 12/2018.  He was admitted and underwent pericardial window.  During that time he also developed atrial fibrillation and had a TIA. His torsemide  was increased because of volume overload. He followed up with catherine lawrence, DNP 02/2021 and his weight was still increasing. Torsemide  was again increased. Repeat Echo 03/2021 revealed LVEF 60-65% with grade 1 diastolic dysfunction. BNP had been normal.   Devon Mathews was taking torsemide  once every other day. He had returned to the lower dose after developing some kidney pain when taking extra torsemide . Overall his edema had improved since he stopped working as a agricultural consultant. He reported typical home blood pressures in the 110s, with rare 90s or 120s. He had one episode of severe fatigue and found his BP to be in the 50s systolic.  He was referred to the PREP program and doing well with weight loss on Ozempic .     At his visit 07/2022 he reported CP and was referred for [facility]. He had no obstructive CAD and prior PCI was  patent.  He was hospitalized 12/2023 at [facility] with chest pressure.  Cardiac enzymes were negative.  He had a negative Lexiscan  Myoview .  He was last seen in clinic 04/2024 at which time he was feeling well but did continue to note exertional dyspnea.  He had a cardiopulmonary exercise test 04/2024 that was limited due to inability to reach maximum exercise secondary to neuropathy.  Resting spirometry suggested mild restriction.  There was mild to moderate  functional impairment thought to be due to obesity and deconditioning.  Blood pressure response was blunted and there was severe chronotropic incompetence.  He feels better and has more energy since the adjustment of his metoprolol .  He experienced an adverse reaction to Jardiance , including hives and elevated heart rate, leading to discontinuation after three days. He also mentions a past episode of significantly elevated blood pressure, described as 'stroke territory', but does not recall the exact timing.  At his visit 07/2024 he was struggling with balance and falls.  He was referred to healthy weight and wellness.  He was interested in restarting GLP-1.   Discussed the use of AI scribe software for clinical note transcription with the patient, who gave verbal consent to proceed.  History of Present Illness Devon Mathews experiences intermittent chest pain, described as starting on the left side near the heart, intensifying over a few seconds before subsiding. These episodes have occurred two or three times since his last visit, with the most recent episode happening the previous night while driving home. The pain occurs at rest, such as when sitting, and not during physical activity. No recent physical activity-related chest pain, shortness of breath, or palpitations. He recalls not experiencing chest pain during a previous exercise test.  He denies recent physical activity, citing laziness as the reason for inactivity. He owns a bicycle but has not used it in six months. He experiences fatigue around 1:30 to 2:00 PM, describing it as 'running out of steam' and needing to rest. He has not checked his blood pressure during these episodes. He monitors his blood pressure at home, noting that the diastolic number is slightly elevated around 60-62, while the systolic number remains around 132.  He reports good sleep quality and consistent use of a CPAP machine for sleep apnea, although he inquires about  alternative treatments. He mentions occasional issues with the CPAP mask seal during sleep.  He has been experiencing stress related to family matters, including the recent passing of his sister and the associated responsibilities and conflicts. He believes this stress may have affected his blood pressure. He mentions that his cousin has struggled with alcohol -related issues, losing jobs and family.  His current medications include amlodipine , losartan , metoprolol , atorvastatin , Plavix , and Eliquis . He takes metoprolol  in the morning.  ROS:  As per HPI  Studies Reviewed: .       Echo 12/2023:  1. Left ventricular ejection fraction, by estimation, is 60 to 65%. Left  ventricular ejection fraction by 3D volume is 67 %. The left ventricle has  normal function. The left ventricle has no regional wall motion  abnormalities. Left ventricular diastolic   parameters were normal. The average left ventricular global longitudinal  strain is -19.5 %. The global longitudinal strain is normal.   2. Right ventricular systolic function is normal. The right ventricular  size is mildly enlarged.   3. Left atrial size was mildly dilated.   4. Right atrial size was moderately dilated.   5. The mitral valve is normal in structure.  Trivial mitral valve  regurgitation.   6. The aortic valve is tricuspid. Aortic valve regurgitation is mild. No  aortic stenosis is present.   7. The inferior vena cava is normal in size with greater than 50%  respiratory variability, suggesting right atrial pressure of 3 mmHg.   CPX 04/2024: Results are limited due to the inability of the patient to reach maximal exercise. Leg neuropathy was a limiting factor in the performance of the test.  Exercise testing with oxygen exchange showed a mild to moderate functional limitation that was felt to be related to deconditioning and obesity. There was also evidence of chronotropic incompetence, meaning that when exercising his heart rate  and blood pressure did not increase as expected with exercise, this can result in increased fatigue and shortness of breath. This can be exacerbated by medications such as beta-blockers, he is currently on metoprolol  succinate 50 mg daily (a beta blocker), recommend decreasing to 25 mg daily. Otherwise continue with recommendations previously provided as noted below:   Risk Assessment/Calculations:    CHA2DS2-VASc Score = 5   This indicates a 7.2% annual risk of stroke. The patient's score is based upon: CHF History: 1 HTN History: 1 Diabetes History: 1 Stroke History: 0 Vascular Disease History: 1 Age Score: 1 Gender Score: 0            Physical Exam:   VS:  BP (!) 126/58   Pulse 79   Ht 5' 10 (1.778 m)   Wt 290 lb (131.5 kg)   SpO2 96%   BMI 41.61 kg/m  , BMI Body mass index is 41.61 kg/m. GENERAL:  Well appearing HEENT: Pupils equal round and reactive, fundi not visualized, oral mucosa unremarkable NECK:  No jugular venous distention, waveform within normal limits, carotid upstroke brisk and symmetric, no bruits, no thyromegaly LUNGS:  Clear to auscultation bilaterally HEART:  RRR.  PMI not displaced or sustained,S1 and S2 within normal limits, no S3, no S4, no clicks, no rubs, II/VI systolic murmur at the LUSB ABD:  Flat, positive bowel sounds normal in frequency in pitch, no bruits, no rebound, no guarding, no midline pulsatile mass, no hepatomegaly, no splenomegaly EXT:  2 plus pulses throughout, no edema, no cyanosis no clubbing SKIN:  No rashes no nodules NEURO:  Cranial nerves II through XII grossly intact, motor grossly intact throughout PSYCH:  Cognitively intact, oriented to person place and time   ASSESSMENT AND PLAN: .    Assessment & Plan # Atherosclerotic heart disease status post percutaneous coronary angioplasty Intermittent left-sided chest pain at rest suggests non-cardiac etiology, likely musculoskeletal or cramp-related. No pain during exercise test  supports non-cardiac origin. - Encouraged resumption of physical activity, such as cycling, to improve cardiovascular health. - Continue clopidogrel , metoprolol  and atorvastatin  - Switch metoprolol  to evening.  # Primary hypertension Blood pressure generally well-controlled with occasional higher diastolic readings. Metoprolol  may contribute to afternoon fatigue. - Consider taking metoprolol  at night to reduce afternoon fatigue. - Continue losartan  and amlodipine  - Monitor blood pressure, especially during episodes of fatigue, to assess for hypotension.  # Paroxysmal atrial fibrillation Managed with metoprolol  for heart rate control. - Continue metoprolol   and Eliquis .  # Obstructive sleep apnea Managed with CPAP therapy. Good adherence but issues with mask seal. Discussed alternative treatment with Inspire device. - Referred to ENT for evaluation of Inspire device as an alternative to CPAP.  # Hyperlipidemia Cholesterol levels well-controlled with atorvastatin . Recent check in October satisfactory. - Continue atorvastatin  for cholesterol management.  #  Morbid Obesity Discussed in context of overall health and wellness. - Encouraged contact with healthy weight and wellness program for support. - Advised resumption of physical activity, such as cycling, to aid weight management.      Dispo: f/u with APP in 6 months.  Me in 1 year  Signed, Annabella Scarce, MD   "

## 2024-11-13 ENCOUNTER — Other Ambulatory Visit (HOSPITAL_BASED_OUTPATIENT_CLINIC_OR_DEPARTMENT_OTHER): Payer: Self-pay | Admitting: Cardiovascular Disease

## 2024-11-16 ENCOUNTER — Ambulatory Visit: Admitting: Gastroenterology

## 2024-11-16 ENCOUNTER — Encounter: Payer: Self-pay | Admitting: Gastroenterology

## 2024-11-16 VITALS — BP 128/60 | HR 66 | Ht 70.0 in | Wt 292.5 lb

## 2024-11-16 DIAGNOSIS — K760 Fatty (change of) liver, not elsewhere classified: Secondary | ICD-10-CM | POA: Diagnosis not present

## 2024-11-16 DIAGNOSIS — K529 Noninfective gastroenteritis and colitis, unspecified: Secondary | ICD-10-CM

## 2024-11-16 DIAGNOSIS — R195 Other fecal abnormalities: Secondary | ICD-10-CM | POA: Insufficient documentation

## 2024-11-16 DIAGNOSIS — K439 Ventral hernia without obstruction or gangrene: Secondary | ICD-10-CM | POA: Diagnosis not present

## 2024-11-16 DIAGNOSIS — E669 Obesity, unspecified: Secondary | ICD-10-CM

## 2024-11-16 NOTE — Patient Instructions (Signed)
 Start Benefiber or Citrucel 2-3 teaspoons in 8 ounces of liquid daily.  You have been scheduled for an abdominal ultrasound at [facility] (1st floor of hospital) on Thursday 11/28/24 at 9:30 am. Please arrive 30 minutes prior to your appointment for registration. Make certain not to have anything to eat or drink 6 hours prior to your appointment. Should you need to reschedule your appointment, please contact radiology at [phone]. This test typically takes about 30 minutes to perform.

## 2024-11-16 NOTE — Progress Notes (Signed)
 "    11/16/2024 Devon Mathews [id number] 1951/05/17   Discussed the use of AI scribe software for clinical note transcription with the patient, who gave verbal consent to proceed.  History of Present Illness Devon Mathews. is a 74 year old male with fatty liver disease and ventral/umbilical hernia who presents for follow-up of fatty liver disease.  He is a patient of Dr. Darilyn.  Fatty liver disease was first discussed in the summer of 2025, with imaging including a CT scan in September 2025 and an ultrasound with elastography in June 2024. He recalls being told there may be early cirrhosis and liver scarring. He expresses concern about central obesity and abdominal bloating, attributing these symptoms in part to visceral fat, and seeks clarification on the relationship between visceral fat and fatty liver. He previously lost approximately 50 pounds through a wellness program but discontinued it and is considering resuming. Insurance coverage for weight loss injectables has not been obtained despite attempts by his cardiologist. He does not consume alcohol  or coffee, but drinks decaffeinated tea.  A longstanding ventral hernia was first evaluated in July 2024, measured at approximately 2 cm and considered asymptomatic at that time. Surgical repair was deemed higher risk due to cardiac history. Over the past 1.5 years, he has noted an increase in hernia size and believes it may be double the previous measurement. The hernia is described as unsightly and more protuberant with central obesity, but causes no significant pain. No bleeding or acute complications have occurred.  Intermittent bloating and abdominal distention are present, which he attributes to fat. A CT scan in September 2025 did not show ascites. He uses a diuretic as needed for ankle swelling or increased shortness of breath, and has previously increased the dose for episodes of fluid retention, which improved his symptoms. Frequent  urination occurs, especially with diuretic use.  Chronic diarrhea is most prominent in the mornings, with urgency and occasional incontinence. Stools are loose in the morning and more formed in the evening. He has not had his gallbladder removed. Urinary urgency is also present, which he attributes to aging.  GERD is managed with Protonix , though current efficacy is uncertain.  He inquires about the possibility of worms in the gut contributing to visceral fat, referencing online information but expresses skepticism.  Recently, he has experienced a weird feeling in the right upper quadrant, more toward the back, which he does not consider typical kidney stone pain. He has a history of left-sided kidney stones but feels this sensation is different. No significant or persistent pain is reported.  Previous EGD colonoscopies reviewed as well as imaging studies.   Past Medical History:  Diagnosis Date   Anxiety    Atrial fibrillation (HCC)    CAD S/P percutaneous coronary angioplasty 09/13/2018    99% p-mLAD (BTW d1&d2) -> SYNERGY DES 3.5 X 12 (3.75 mm). 20% LM.  Cx, small RI & co-dom RCA normal.  EF 65%.    Chronic diastolic heart failure (HCC) 11/30/2019   Congestive heart failure (CHF) (HCC)    Diabetes mellitus without complication (HCC)    Dizziness 2012   normal findings..   Essential hypertension 07/28/2016   GERD (gastroesophageal reflux disease)    Hyperlipidemia    Lower extremity edema 07/28/2016   NAFLD (nonalcoholic fatty liver disease)    NSTEMI (non-ST elevated myocardial infarction) (HCC) 09/13/2018   The patient presented 09/13/2018 with a non-ST elevation MI, troponin peak was 0.15   Obesity  Obesity (BMI 30-39.9) 07/28/2016   OSA (obstructive sleep apnea) 07/28/2016   Restless leg syndrome    mild   SCCA (squamous cell carcinoma) of skin 08/24/2021   Left Forehead (in situ-inflitrating)   Shortness of breath 07/28/2016   Sleep apnea    Squamous cell carcinoma  of skin 03/20/2015   right ant scalp(curetx3, 70fu)   Squamous cell carcinoma of skin 01/01/2016   left lateral forehead (curetx3, 66fu)   Squamous cell carcinoma of skin 06/15/2019   left mid anterior scalp (tx afte bx)   Squamous cell carcinoma of skin 01/08/2021   well diff- left forehead   Squamous cell carcinoma of skin 01/08/2021   in situ- mid frontal scalp   TIA (transient ischemic attack)    Tubular adenoma    Past Surgical History:  Procedure Laterality Date   COLONOSCOPY     CORONARY BALLOON ANGIOPLASTY N/A 12/05/2018   Procedure: CORONARY BALLOON ANGIOPLASTY;  Surgeon: Anner Alm ORN, MD;  Location: [facility];  Service: Cardiovascular;  Laterality: N/A;   CORONARY STENT INTERVENTION N/A 09/13/2018   Procedure: CORONARY STENT INTERVENTION;  Surgeon: Claudene Victory ORN, MD;  Location: [facility];  Service: Cardiovascular;; p-m LAD (btw D1&D2) - SYNERGY DES 3.5 x 12 (3.75 mm).   ESOPHAGOGASTRODUODENOSCOPY (EGD) WITH PROPOFOL  N/A 03/01/2022   Procedure: ESOPHAGOGASTRODUODENOSCOPY (EGD) WITH PROPOFOL ;  Surgeon: Avram Lupita BRAVO, MD;  Location: [facility];  Service: Gastroenterology;  Laterality: N/A;  Polypectomy   GASTRIC VARICES BANDING  03/01/2022   Procedure: GASTRIC  BANDING;  Surgeon: Avram Lupita BRAVO, MD;  Location: [facility];  Service: Gastroenterology;;   KNEE ARTHROSCOPY Right    LEFT HEART CATH AND CORONARY ANGIOGRAPHY N/A 09/13/2018   Procedure: LEFT HEART CATH AND CORONARY ANGIOGRAPHY;  Surgeon: Claudene Victory ORN, MD;  Location: [facility];  Service: Cardiovascular;;  99% p-mLAD (BTW d1&d2) -> SYNERGY DES 3.5 X 12 (3.75 mm). 20% LM.  Cx, small RI & co-dom RCA normal.  EF 65%.    LEFT HEART CATH AND CORONARY ANGIOGRAPHY  2003   30-40% proximal segmental stenosis in the LAD   LEFT HEART CATH AND CORONARY ANGIOGRAPHY  2006   EF >50% & no mitral regurgitation   LEFT HEART CATH AND CORONARY ANGIOGRAPHY N/A 12/05/2018   Procedure: LEFT HEART CATH AND  CORONARY ANGIOGRAPHY;  Surgeon: Anner Alm ORN, MD;  Location: [facility];  Service: Cardiovascular;  Laterality: N/A;   LEFT HEART CATH AND CORONARY ANGIOGRAPHY N/A 08/17/2022   Procedure: LEFT HEART CATH AND CORONARY ANGIOGRAPHY;  Surgeon: Wonda Sharper, MD;  Location: [facility];  Service: Cardiovascular;  Laterality: N/A;   NM MYOVIEW  LTD  07/2016   Normal.  EF 55-65% 63%).  No ischemia or infarction.  LOW RISK   NM MYOVIEW  LTD  11/2018   6: 34 min.  7.2 METS.  Noted chest pain and tightness.  Horizontal ST of T wave depression (2 mm) II, 3, V5 and V6 (HIGH RISK), large/severe defect basal-apical inferior, inferolateral wall.  Large-severe basal-apical anterior-anterolateral wall.  Medium/moderate defect in basal and mid inferoseptal wall.  Consistent with large prior infarct with peri-infarct ischemia.  HIGH RISK   PERICARDIOCENTESIS N/A 12/05/2018   Procedure: PERICARDIOCENTESIS;  Surgeon: Anner Alm ORN, MD;  Location: [facility] IN[facility] CV [facility];  Service: Cardiovascular;  Laterality: N/A;   SUBXYPHOID PERICARDIAL WINDOW N/A 12/25/2018   Procedure: SUBXYPHOID PERICARDIAL WINDOW;  Surgeon: Lucas Dorise POUR, MD;  Location: [facility];  Service: Thoracic;  Laterality: N/A;   TEE WITHOUT CARDIOVERSION N/A 12/25/2018   Procedure: TRANSESOPHAGEAL ECHOCARDIOGRAM (TEE);  Surgeon: Lucas Dorise POUR, MD;  Location: [facility] OR;  Service: Thoracic;  Laterality: N/A;   tendon detachment Right    arm   TRANSTHORACIC ECHOCARDIOGRAM  07/2016   EF 60-65% with mild LVH.  Mild AI.  Trivial MR and TR.   VASECTOMY      reports that he has never smoked. He has been exposed to tobacco smoke. He has never used smokeless tobacco. He reports that he does not drink alcohol  and does not use drugs. family history includes COPD in his father; Diabetes in his brother, mother, and sister; Heart attack in his paternal grandmother and sister; Heart disease in his father and mother; Heart failure in his father, mother, and  sister; Hypertension in his mother; Other in his father and maternal grandfather; Pancreatic cancer in his paternal aunt and paternal uncle; Stroke in his father. Allergies[1]    Outpatient Encounter Medications as of 11/16/2024  Medication Sig   acetaminophen  (TYLENOL ) 500 MG tablet Take 500-1,000 mg by mouth every 6 (six) hours as needed for moderate pain.   amitriptyline  (ELAVIL ) 25 MG tablet Take 0.5 tablets (12.5 mg total) by mouth at bedtime as needed for sleep.   amLODipine  (NORVASC ) 2.5 MG tablet Take 1 tablet (2.5 mg total) by mouth daily.   atorvastatin  (LIPITOR ) 80 MG tablet TAKE 1 TABLET BY MOUTH EVERY DAY   clonazePAM (KLONOPIN) 0.5 MG tablet Take 0.5 mg by mouth daily as needed for anxiety.   clopidogrel  (PLAVIX ) 75 MG tablet Take 1 tablet (75 mg total) by mouth daily.   ELIQUIS  5 MG TABS tablet Take 1 tablet (5 mg total) by mouth 2 (two) times daily.   escitalopram (LEXAPRO) 10 MG tablet Take 10 mg by mouth daily.   losartan  (COZAAR ) 50 MG tablet TAKE 1 TABLET BY MOUTH EVERY DAY   melatonin 5 MG TABS Take 5 mg by mouth at bedtime.   metoprolol  succinate (TOPROL  XL) 25 MG 24 hr tablet Take 1 tablet (25 mg total) by mouth daily.   nitroGLYCERIN  (NITROSTAT ) 0.4 MG SL tablet Place 1 tablet (0.4 mg total) under the tongue every 5 (five) minutes as needed for chest pain.   pantoprazole  (PROTONIX ) 40 MG tablet TAKE ONE TABLET BY MOUTH ONE TIME DAILY. must make follow up appointment for further refills   potassium chloride  SA (KLOR-CON  M) 20 MEQ tablet Take 20 mEq by mouth daily as needed (when taking furosemide ). (Patient taking differently: Take 10 mEq by mouth daily as needed (when taking furosemide ).)   torsemide  (DEMADEX ) 20 MG tablet Take 1 tablet (20 mg total) by mouth daily. (Patient taking differently: Take 20 mg by mouth daily. Taking it every other day)   traMADol  (ULTRAM ) 50 MG tablet Take 50 mg by mouth daily as needed (pain).   Facility-Administered Encounter Medications as  of 11/16/2024  Medication   sodium chloride  flush (NS) 0.9 % injection 3 mL    REVIEW OF SYSTEMS  : All other systems reviewed and negative except where noted in the History of Present Illness.   PHYSICAL EXAM: BP 128/60   Pulse 66   Ht 5' 10 (1.778 m)   Wt 292 lb 8 oz (132.7 kg)   BMI 41.97 kg/m  General: Well developed white male in no acute distress Head: Normocephalic and atraumatic Eyes:  Sclerae anicteric, conjunctiva pink. Ears: Normal auditory acuity Lungs: Clear throughout to auscultation; no W/R/R. Heart: Regular rate  and rhythm; no M/R/G. Abdomen: Soft, obese.  BS present.  Large non-tender umbilical hernia noted.  Some TTP in upper abdomen. Musculoskeletal: Symmetrical with no gross deformities  Skin: No lesions on visible extremities Neurological: Alert oriented x 4, grossly non-focal Psychological:  Alert and cooperative. 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 Risk for progression is increased due to central adiposity and prediabetes. Future GLP-1 agonist therapy may be considered if fibrosis is confirmed and insurance approval is possible. - Placed on list for FibroScan (anticipated April/May) to assess hepatic fibrosis. - Ordered repeat liver ultrasound with elastography for comparison to prior study. - Will review most recent laboratory results from PCP or request if unavailable.  Will calculate updated Fib 4 score from those. - Discussed potential future use of GLP-1 agonists (Ozempic , Wegovy, Mounjaro ) if fibrosis is confirmed and insurance approval is possible.  Enlarging ventral hernia/umbilical hernia Chronic, minimally symptomatic ventral hernia with interval increase in size. Surgical risk remains elevated due to cardiac history, but current cardiac function is stable. Hernia is primarily a cosmetic concern at  present. - Discussed option to re-refer to surgery for re-evaluation if desired, especially if symptoms worsen or for further discussion of surgical risk versus benefit.  He wants to hold off on seeing them again for now. - Recommended weight loss to reduce intra-abdominal pressure and potentially improve hernia symptoms and surgical candidacy.  Chronic diarrhea Chronic, predominantly morning diarrhea with urgency and occasional incontinence, likely multifactorial with contributions from pelvic floor weakness and age-related changes. - Recommended trial of over-the-counter fiber supplement (Benefiber or generic equivalent), starting with 2-3 teaspoons daily in the morning, with instructions to increase as tolerated and to avoid excessive gas/bloating. - Provided written instructions regarding fiber supplementation.  Obesity with central adiposity Obesity with significant visceral adiposity contributing to MASLD and hernia. Prior successful weight loss with structured program. Future GLP-1 agonist therapy may be considered if hepatic fibrosis is confirmed and insurance approval is possible. - Encouraged re-engagement with weight management program. - Discussed future potential for GLP-1 agonist therapy (Ozempic , Wegovy, Mounjaro ) if insurance approval is possible based on FibroScan results and/or additional comorbidities.   CC:  Tisovec, Charlie ORN, MD        [1]  Allergies Allergen Reactions   Empagliflozin  Rash, Nausea And Vomiting and Palpitations    Increased heart rate   Penicillins Hives and Nausea And Vomiting    Did it involve swelling of the face/tongue/throat, SOB, or low BP? No Did it involve sudden or severe rash/hives, skin peeling, or any reaction on the inside of your mouth or nose? No Did you need to seek medical attention at a hospital or doctor's office? No When did it last happen?      30 + years If all above answers are NO, may proceed with cephalosporin use.    "

## 2024-11-16 NOTE — Addendum Note (Signed)
 Addended by: WILL POWELL CROME on: 11/16/2024 02:45 PM   Modules accepted: Orders

## 2024-11-28 ENCOUNTER — Ambulatory Visit (HOSPITAL_COMMUNITY)
